# Patient Record
Sex: Male | Born: 1935 | Race: White | Hispanic: No | Marital: Married | Smoking: Former smoker
Health system: Southern US, Community
[De-identification: ages and names within clinical notes are randomized; demographics above are authoritative.]

## PROBLEM LIST (undated history)

## (undated) DIAGNOSIS — E785 Hyperlipidemia, unspecified: Secondary | ICD-10-CM

## (undated) DIAGNOSIS — J449 Chronic obstructive pulmonary disease, unspecified: Secondary | ICD-10-CM

## (undated) DIAGNOSIS — B009 Herpesviral infection, unspecified: Secondary | ICD-10-CM

## (undated) DIAGNOSIS — I4891 Unspecified atrial fibrillation: Secondary | ICD-10-CM

## (undated) DIAGNOSIS — J9383 Other pneumothorax: Principal | ICD-10-CM

## (undated) DIAGNOSIS — T7840XA Allergy, unspecified, initial encounter: Secondary | ICD-10-CM

## (undated) DIAGNOSIS — K219 Gastro-esophageal reflux disease without esophagitis: Secondary | ICD-10-CM

## (undated) DIAGNOSIS — N529 Male erectile dysfunction, unspecified: Secondary | ICD-10-CM

## (undated) DIAGNOSIS — N400 Enlarged prostate without lower urinary tract symptoms: Secondary | ICD-10-CM

## (undated) DIAGNOSIS — I2510 Atherosclerotic heart disease of native coronary artery without angina pectoris: Secondary | ICD-10-CM

## (undated) DIAGNOSIS — R718 Other abnormality of red blood cells: Secondary | ICD-10-CM

## (undated) DIAGNOSIS — M549 Dorsalgia, unspecified: Secondary | ICD-10-CM

## (undated) DIAGNOSIS — M1990 Unspecified osteoarthritis, unspecified site: Secondary | ICD-10-CM

## (undated) HISTORY — DX: Male erectile dysfunction, unspecified: N52.9

## (undated) HISTORY — DX: Unspecified osteoarthritis, unspecified site: M19.90

## (undated) HISTORY — DX: Unspecified atrial fibrillation: I48.91

## (undated) HISTORY — DX: Other abnormality of red blood cells: R71.8

## (undated) HISTORY — DX: Benign prostatic hyperplasia without lower urinary tract symptoms: N40.0

## (undated) HISTORY — DX: Dorsalgia, unspecified: M54.9

## (undated) HISTORY — PX: CATARACT EXTRACTION, BILATERAL: SHX1313

## (undated) HISTORY — DX: Other pneumothorax: J93.83

## (undated) HISTORY — DX: Herpesviral infection, unspecified: B00.9

## (undated) HISTORY — DX: Allergy, unspecified, initial encounter: T78.40XA

## (undated) HISTORY — DX: Gastro-esophageal reflux disease without esophagitis: K21.9

## (undated) HISTORY — DX: Hyperlipidemia, unspecified: E78.5

---

## 1985-10-17 DIAGNOSIS — E785 Hyperlipidemia, unspecified: Secondary | ICD-10-CM

## 1990-01-17 DIAGNOSIS — D539 Nutritional anemia, unspecified: Secondary | ICD-10-CM | POA: Insufficient documentation

## 1996-04-17 DIAGNOSIS — F329 Major depressive disorder, single episode, unspecified: Secondary | ICD-10-CM | POA: Insufficient documentation

## 1998-08-18 ENCOUNTER — Encounter: Payer: Self-pay | Admitting: Family Medicine

## 1998-08-18 LAB — CONVERTED CEMR LAB: PSA: 0.4 ng/mL

## 1998-10-18 DIAGNOSIS — K5730 Diverticulosis of large intestine without perforation or abscess without bleeding: Secondary | ICD-10-CM | POA: Insufficient documentation

## 1999-08-18 ENCOUNTER — Encounter: Payer: Self-pay | Admitting: Family Medicine

## 2000-10-17 ENCOUNTER — Encounter: Payer: Self-pay | Admitting: Family Medicine

## 2000-10-17 LAB — CONVERTED CEMR LAB: PSA: 0.4 ng/mL

## 2001-11-17 ENCOUNTER — Encounter: Payer: Self-pay | Admitting: Family Medicine

## 2001-11-17 DIAGNOSIS — J309 Allergic rhinitis, unspecified: Secondary | ICD-10-CM

## 2001-11-17 LAB — CONVERTED CEMR LAB: PSA: 0.4 ng/mL

## 2001-12-17 DIAGNOSIS — M949 Disorder of cartilage, unspecified: Secondary | ICD-10-CM

## 2001-12-17 DIAGNOSIS — M479 Spondylosis, unspecified: Secondary | ICD-10-CM | POA: Insufficient documentation

## 2001-12-17 DIAGNOSIS — M899 Disorder of bone, unspecified: Secondary | ICD-10-CM | POA: Insufficient documentation

## 2001-12-28 ENCOUNTER — Encounter: Admission: RE | Admit: 2001-12-28 | Discharge: 2001-12-28 | Payer: Self-pay | Admitting: Family Medicine

## 2001-12-28 ENCOUNTER — Encounter: Payer: Self-pay | Admitting: Family Medicine

## 2002-11-18 ENCOUNTER — Encounter: Payer: Self-pay | Admitting: Family Medicine

## 2002-11-18 DIAGNOSIS — E739 Lactose intolerance, unspecified: Secondary | ICD-10-CM

## 2002-11-18 LAB — CONVERTED CEMR LAB: PSA: 0.8 ng/mL

## 2003-04-15 ENCOUNTER — Encounter: Admission: RE | Admit: 2003-04-15 | Discharge: 2003-04-15 | Payer: Self-pay | Admitting: Family Medicine

## 2003-10-18 ENCOUNTER — Encounter: Payer: Self-pay | Admitting: Family Medicine

## 2003-10-18 LAB — CONVERTED CEMR LAB: PSA: 0.5 ng/mL

## 2003-11-17 ENCOUNTER — Ambulatory Visit: Payer: Self-pay | Admitting: Family Medicine

## 2003-11-19 ENCOUNTER — Ambulatory Visit: Payer: Self-pay | Admitting: Family Medicine

## 2004-02-18 ENCOUNTER — Ambulatory Visit: Payer: Self-pay | Admitting: Gastroenterology

## 2004-02-20 ENCOUNTER — Ambulatory Visit: Payer: Self-pay | Admitting: Family Medicine

## 2004-03-10 ENCOUNTER — Ambulatory Visit: Payer: Self-pay | Admitting: Gastroenterology

## 2004-05-18 ENCOUNTER — Ambulatory Visit: Payer: Self-pay | Admitting: Family Medicine

## 2004-08-12 ENCOUNTER — Ambulatory Visit: Payer: Self-pay | Admitting: Family Medicine

## 2004-11-12 ENCOUNTER — Ambulatory Visit: Payer: Self-pay | Admitting: Internal Medicine

## 2004-11-17 ENCOUNTER — Encounter: Payer: Self-pay | Admitting: Family Medicine

## 2004-11-17 LAB — CONVERTED CEMR LAB: PSA: 0.55 ng/mL

## 2004-11-18 ENCOUNTER — Ambulatory Visit: Payer: Self-pay | Admitting: Family Medicine

## 2004-11-23 ENCOUNTER — Ambulatory Visit: Payer: Self-pay | Admitting: Family Medicine

## 2004-12-13 ENCOUNTER — Ambulatory Visit: Payer: Self-pay | Admitting: Family Medicine

## 2004-12-22 ENCOUNTER — Ambulatory Visit: Payer: Self-pay | Admitting: Family Medicine

## 2005-01-04 ENCOUNTER — Ambulatory Visit: Payer: Self-pay | Admitting: Family Medicine

## 2005-01-12 ENCOUNTER — Ambulatory Visit: Payer: Self-pay | Admitting: Family Medicine

## 2005-01-19 ENCOUNTER — Ambulatory Visit: Payer: Self-pay | Admitting: Family Medicine

## 2005-01-24 ENCOUNTER — Ambulatory Visit: Payer: Self-pay | Admitting: Family Medicine

## 2005-02-03 ENCOUNTER — Ambulatory Visit: Payer: Self-pay | Admitting: Family Medicine

## 2005-02-22 ENCOUNTER — Ambulatory Visit: Payer: Self-pay | Admitting: Family Medicine

## 2005-02-24 ENCOUNTER — Ambulatory Visit: Payer: Self-pay | Admitting: Family Medicine

## 2005-07-23 ENCOUNTER — Ambulatory Visit: Payer: Self-pay | Admitting: Family Medicine

## 2005-07-27 ENCOUNTER — Ambulatory Visit: Payer: Self-pay | Admitting: Family Medicine

## 2005-09-21 ENCOUNTER — Ambulatory Visit: Payer: Self-pay | Admitting: Family Medicine

## 2005-11-10 ENCOUNTER — Ambulatory Visit: Payer: Self-pay | Admitting: Family Medicine

## 2005-11-10 LAB — CONVERTED CEMR LAB
Hgb A1c MFr Bld: 5.7 %
Microalbumin U total vol: 8.4 mg/L
PSA: 0.53 ng/mL
TSH: 1.19 microintl units/mL

## 2005-11-21 ENCOUNTER — Ambulatory Visit: Payer: Self-pay | Admitting: Family Medicine

## 2005-12-15 ENCOUNTER — Ambulatory Visit: Payer: Self-pay | Admitting: Family Medicine

## 2006-01-02 ENCOUNTER — Ambulatory Visit: Payer: Self-pay | Admitting: Family Medicine

## 2006-01-17 DIAGNOSIS — B009 Herpesviral infection, unspecified: Secondary | ICD-10-CM | POA: Insufficient documentation

## 2006-01-20 ENCOUNTER — Ambulatory Visit: Payer: Self-pay | Admitting: Family Medicine

## 2006-01-30 ENCOUNTER — Ambulatory Visit: Payer: Self-pay | Admitting: Family Medicine

## 2006-07-27 ENCOUNTER — Encounter: Payer: Self-pay | Admitting: Family Medicine

## 2006-07-27 DIAGNOSIS — T883XXA Malignant hyperthermia due to anesthesia, initial encounter: Secondary | ICD-10-CM

## 2006-07-27 DIAGNOSIS — F1021 Alcohol dependence, in remission: Secondary | ICD-10-CM | POA: Insufficient documentation

## 2006-07-27 DIAGNOSIS — J449 Chronic obstructive pulmonary disease, unspecified: Secondary | ICD-10-CM

## 2006-07-27 DIAGNOSIS — K649 Unspecified hemorrhoids: Secondary | ICD-10-CM | POA: Insufficient documentation

## 2006-07-27 DIAGNOSIS — J4489 Other specified chronic obstructive pulmonary disease: Secondary | ICD-10-CM | POA: Insufficient documentation

## 2006-07-28 ENCOUNTER — Ambulatory Visit: Payer: Self-pay | Admitting: Family Medicine

## 2006-07-28 DIAGNOSIS — K219 Gastro-esophageal reflux disease without esophagitis: Secondary | ICD-10-CM

## 2007-01-29 ENCOUNTER — Ambulatory Visit: Payer: Self-pay | Admitting: Family Medicine

## 2007-01-29 DIAGNOSIS — E749 Disorder of carbohydrate metabolism, unspecified: Secondary | ICD-10-CM | POA: Insufficient documentation

## 2007-01-29 LAB — CONVERTED CEMR LAB
ALT: 18 units/L (ref 0–53)
AST: 23 units/L (ref 0–37)
Albumin: 4 g/dL (ref 3.5–5.2)
Alkaline Phosphatase: 59 units/L (ref 39–117)
BUN: 17 mg/dL (ref 6–23)
Bilirubin, Direct: 0.1 mg/dL (ref 0.0–0.3)
Calcium: 9.3 mg/dL (ref 8.4–10.5)
Chloride: 106 meq/L (ref 96–112)
Cholesterol: 151 mg/dL (ref 0–200)
GFR calc Af Amer: 63 mL/min
GFR calc non Af Amer: 52 mL/min
Glucose, Bld: 117 mg/dL — ABNORMAL HIGH (ref 70–99)
LDL Cholesterol: 93 mg/dL (ref 0–99)
Total CHOL/HDL Ratio: 4.4

## 2007-02-01 ENCOUNTER — Ambulatory Visit: Payer: Self-pay | Admitting: Family Medicine

## 2007-03-07 ENCOUNTER — Telehealth (INDEPENDENT_AMBULATORY_CARE_PROVIDER_SITE_OTHER): Payer: Self-pay | Admitting: Internal Medicine

## 2008-02-04 ENCOUNTER — Ambulatory Visit: Payer: Self-pay | Admitting: Family Medicine

## 2008-02-04 LAB — CONVERTED CEMR LAB
ALT: 19 units/L (ref 0–53)
Albumin: 3.8 g/dL (ref 3.5–5.2)
Basophils Absolute: 0 10*3/uL (ref 0.0–0.1)
Basophils Relative: 0.6 % (ref 0.0–3.0)
CO2: 28 meq/L (ref 19–32)
Calcium: 9.2 mg/dL (ref 8.4–10.5)
Cholesterol: 222 mg/dL (ref 0–200)
Creatinine, Ser: 1.4 mg/dL (ref 0.4–1.5)
Creatinine,U: 123.9 mg/dL
Eosinophils Absolute: 0.2 10*3/uL (ref 0.0–0.7)
GFR calc non Af Amer: 52 mL/min
HDL: 30.9 mg/dL — ABNORMAL LOW (ref >39.0)
Hemoglobin: 15.3 g/dL (ref 13.0–17.0)
Iron: 75 ug/dL (ref 42–165)
MCHC: 34.5 g/dL (ref 30.0–36.0)
MCV: 105.4 fL — ABNORMAL HIGH (ref 78.0–100.0)
Microalb, Ur: 0.2 mg/dL (ref 0.0–1.9)
Neutro Abs: 4.8 10*3/uL (ref 1.4–7.7)
RBC: 4.21 M/uL — ABNORMAL LOW (ref 4.22–5.81)
Total Bilirubin: 1.2 mg/dL (ref 0.3–1.2)
Total CHOL/HDL Ratio: 7.2
Triglycerides: 182 mg/dL — ABNORMAL HIGH (ref 0–149)
VLDL: 36 mg/dL (ref 0–40)
WBC: 7.3 10*3/uL (ref 4.5–10.5)

## 2008-02-05 LAB — CONVERTED CEMR LAB: Vit D, 1,25-Dihydroxy: 27 — ABNORMAL LOW (ref 30–89)

## 2008-02-06 ENCOUNTER — Ambulatory Visit: Payer: Self-pay | Admitting: Family Medicine

## 2008-02-06 DIAGNOSIS — F17200 Nicotine dependence, unspecified, uncomplicated: Secondary | ICD-10-CM

## 2008-02-11 ENCOUNTER — Encounter (INDEPENDENT_AMBULATORY_CARE_PROVIDER_SITE_OTHER): Payer: Self-pay | Admitting: *Deleted

## 2008-03-12 ENCOUNTER — Ambulatory Visit: Payer: Self-pay | Admitting: Family Medicine

## 2008-03-12 LAB — CONVERTED CEMR LAB: ALT: 21 units/L (ref 0–53)

## 2008-04-15 ENCOUNTER — Telehealth: Payer: Self-pay | Admitting: Family Medicine

## 2008-04-30 ENCOUNTER — Ambulatory Visit: Payer: Self-pay | Admitting: Family Medicine

## 2008-04-30 LAB — CONVERTED CEMR LAB
Cholesterol: 128 mg/dL (ref 0–200)
LDL Cholesterol: 73 mg/dL (ref 0–99)
Triglycerides: 116 mg/dL (ref 0.0–149.0)

## 2008-05-05 ENCOUNTER — Ambulatory Visit: Payer: Self-pay | Admitting: Family Medicine

## 2008-07-11 ENCOUNTER — Telehealth: Payer: Self-pay | Admitting: Family Medicine

## 2008-07-31 ENCOUNTER — Ambulatory Visit: Payer: Self-pay | Admitting: Family Medicine

## 2009-01-27 ENCOUNTER — Encounter (INDEPENDENT_AMBULATORY_CARE_PROVIDER_SITE_OTHER): Payer: Self-pay | Admitting: *Deleted

## 2009-01-28 ENCOUNTER — Telehealth: Payer: Self-pay | Admitting: Family Medicine

## 2009-02-03 ENCOUNTER — Ambulatory Visit: Payer: Self-pay | Admitting: Family Medicine

## 2009-02-04 LAB — CONVERTED CEMR LAB
ALT: 22 units/L (ref 0–53)
AST: 35 units/L (ref 0–37)
BUN: 15 mg/dL (ref 6–23)
Basophils Absolute: 0 10*3/uL (ref 0.0–0.1)
Bilirubin, Direct: 0.1 mg/dL (ref 0.0–0.3)
Calcium: 9.7 mg/dL (ref 8.4–10.5)
Cholesterol: 152 mg/dL (ref 0–200)
Creatinine, Ser: 1.5 mg/dL (ref 0.4–1.5)
Creatinine,U: 75.5 mg/dL
Direct LDL: 93 mg/dL
Eosinophils Relative: 2 % (ref 0.0–5.0)
GFR calc non Af Amer: 48 mL/min (ref >60)
Glucose, Bld: 114 mg/dL — ABNORMAL HIGH (ref 70–99)
HDL: 36.5 mg/dL — ABNORMAL LOW (ref >39.00)
Hgb A1c MFr Bld: 5.7 % (ref 4.6–6.5)
Microalb, Ur: 0.3 mg/dL (ref 0.0–1.9)
Monocytes Relative: 10.4 % (ref 3.0–12.0)
Neutrophils Relative %: 70.8 % (ref 43.0–77.0)
PSA: 0.73 ng/mL (ref 0.10–4.00)
Platelets: 226 10*3/uL (ref 150.0–400.0)
Potassium: 4.8 meq/L (ref 3.5–5.1)
RDW: 13.5 % (ref 11.5–14.6)
Total Bilirubin: 1.5 mg/dL — ABNORMAL HIGH (ref 0.3–1.2)
Triglycerides: 201 mg/dL — ABNORMAL HIGH (ref 0.0–149.0)
VLDL: 40.2 mg/dL — ABNORMAL HIGH (ref 0.0–40.0)
Vit D, 25-Hydroxy: 27 ng/mL — ABNORMAL LOW (ref 30–89)
Vitamin B-12: 548 pg/mL (ref 211–911)
WBC: 7.4 10*3/uL (ref 4.5–10.5)

## 2009-02-10 ENCOUNTER — Ambulatory Visit: Payer: Self-pay | Admitting: Family Medicine

## 2009-05-04 ENCOUNTER — Telehealth: Payer: Self-pay | Admitting: Family Medicine

## 2009-06-08 ENCOUNTER — Telehealth: Payer: Self-pay | Admitting: Family Medicine

## 2009-07-14 ENCOUNTER — Telehealth: Payer: Self-pay | Admitting: Family Medicine

## 2009-08-24 ENCOUNTER — Encounter (INDEPENDENT_AMBULATORY_CARE_PROVIDER_SITE_OTHER): Payer: Self-pay | Admitting: *Deleted

## 2009-09-01 ENCOUNTER — Ambulatory Visit: Payer: Self-pay | Admitting: Family Medicine

## 2009-09-01 DIAGNOSIS — R252 Cramp and spasm: Secondary | ICD-10-CM | POA: Insufficient documentation

## 2009-09-10 ENCOUNTER — Telehealth: Payer: Self-pay | Admitting: Gastroenterology

## 2009-09-23 ENCOUNTER — Telehealth: Payer: Self-pay | Admitting: Family Medicine

## 2009-11-23 ENCOUNTER — Telehealth: Payer: Self-pay | Admitting: Family Medicine

## 2010-02-10 ENCOUNTER — Ambulatory Visit
Admission: RE | Admit: 2010-02-10 | Discharge: 2010-02-10 | Payer: Self-pay | Source: Home / Self Care | Attending: Family Medicine | Admitting: Family Medicine

## 2010-02-10 DIAGNOSIS — D492 Neoplasm of unspecified behavior of bone, soft tissue, and skin: Secondary | ICD-10-CM | POA: Insufficient documentation

## 2010-02-16 NOTE — Progress Notes (Signed)
Summary: Rx Cyclobenzaprine  Phone Note Refill Request Call back at [phone] Message from:  [facility] on January 28, 2009 12:27 PM  Refills Requested: Medication #1:  FLEXERIL 10 MG  TABS one tab by mouth three times a day as needed   Last Refilled: 07/11/2008 Received refill request, please advise   Method Requested: Electronic Initial call taken by: Sherrian Divers CMA Deborra Medina),  January 28, 2009 12:27 PM  Follow-up for Phone Call        px written on EMR for call in  Follow-up by: Allena Earing MD,  January 28, 2009 1:29 PM  Additional Follow-up for Phone Call Additional follow up Details #1::        Script sent electronically. Additional Follow-up by: Marty Heck CMA,  January 28, 2009 2:16 PM    Prescriptions: FLEXERIL 10 MG  TABS (CYCLOBENZAPRINE HCL) one tab by mouth three times a day as needed  #30 x 0   Entered by:   Marty Heck CMA   Authorized by:   Allena Earing MD   Signed by:   Marty Heck CMA on 01/28/2009   Method used:   Electronically to        [facility] #[id number]* (retail)       [address]       [place], [place]  [address]       Ph: [phone]       Fax: [phone]   RxID:   [id number] FLEXERIL 10 MG  TABS (CYCLOBENZAPRINE HCL) one tab by mouth three times a day as needed  #30 x 0   Entered and Authorized by:   Allena Earing MD   Signed by:   Allena Earing MD on 01/28/2009   Method used:   Telephoned to ...       [id number] ([facility]ail)       [address]       [place], [place]  [address]       Ph: [phone]       Fax: [phone]   RxID:   [id number]

## 2010-02-16 NOTE — Progress Notes (Signed)
Summary: Cyclobenzaprine 38m refill  Phone Note Refill Request Call back at [phone] from:  [facility] MPhilipp Deputyon Jun 08, 2009 5:22 PM  Refills Requested: Medication #1:  FLEXERIL 10 MG  TABS one tab by mouth three times a day as needed [facility] request refill for Cyclobenzaprine 171mNo refill date sent. Please advise.    Method Requested: Telephone to Pharmacy Initial call taken by: ReOzzie HoylePN,  May 23, 206438:3:77M    Prescriptions: FLEXERIL 10 MG  TABS (CYCLOBENZAPRINE HCL) one tab by mouth three times a day as needed  #30 x 0   Entered by:   RoRaenette RoverD   Authorized by:   MaAllena EaringD   Signed by:   RoRaenette RoverD on 06/09/2009   Method used:   [facility]        [id number]       [address]     [place]  [id number]     Ph: [phone]     Fax: [phone] RxID:   [id number]

## 2010-02-16 NOTE — Assessment & Plan Note (Signed)
Summary: cpx/bir   Vital Signs:  Patient profile:   77 year old male Weight:      179 pounds Temp:     97.5 degrees F oral Pulse rate:   60 / minute Pulse rhythm:   regular BP sitting:   120 / 80  (left arm) Cuff size:   regular  Vitals Entered By: Emelia Salisbury LPN (February 11, 1476 1:50 PM) CC: 30 Minute checkup, had a colonoscopy by Dr. Fuller Plan 02/06   History of Present Illness: Pt here for followup. He has no complaints and feels well. He has been having dental work lately and had an inplant last week. He has been losing teeth. Otherwise he has no acute complaints and feels well.  Preventive Screening-Counseling & Management  Alcohol-Tobacco     Alcohol drinks/day: 0     Alcohol type: none past etohic     Smoking Status: quit     Year Quit: 06/2000     Pack years: 50     Passive Smoke Exposure: no  Caffeine-Diet-Exercise     Caffeine use/day: 4     Does Patient Exercise: yes     Type of exercise: works some, plays golf,cuts wood  Problems Prior to Update: 1)  Foot Pain, Right  (ICD-729.5) 2)  Muscle Spasm, Right Trap  (ICD-728.85) 3)  Special Screening Malignant Neoplasm of Prostate  (ICD-V76.44) 4)  Unspec Disorder Carbohydrate Transport&metab  (ICD-271.9) 5)  G E R D  (ICD-530.81) 6)  Herpes Simplex Infection, Type I  (ICD-054.9) 7)  Diverticulosis, Colon  (ICD-562.10) 8)  Glucose Intolerance  (ICD-271.3) 9)  Hyperthermia, Malignant, Fh  (ICD-995.86) 10)  Arthritis, Cervical Spine  (ICD-721.90) 11)  Osteopenia  (ICD-733.90) 12)  Allergic Rhinitis  (ICD-477.9) 13)  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 One Tab By Mouth At Night 2)  Viagra 50 Mg  Tabs (Sildenafil Citrate) .... Take One Hour  Prior 3)  Valtrex 1 Gm  Tabs (Valacyclovir Hcl) .... 2 Gm. Two Times A Day As Needed 4)  Prilosec 20 Mg  Cpdr (Omeprazole) .Marland Kitchen.. 1 Tab By Mouth 45 Mins Before Eating. 5)  Cinnamon 500 Mg  Caps (Cinnamon) .... Take One By Mouth Once A Day 6)  Flexeril 10 Mg  Tabs (Cyclobenzaprine Hcl) .... One Tab By Mouth Three Times A Day As Needed 7)  Multivitamins  Tabs (Multiple Vitamin) .... 3 Times Per Week 8)  Fish Oil 1000 Mg Caps (Omega-3 Fatty Acids) .... Takes As Needed During The Week 9)  Crestor 10 Mg Tabs (Rosuvastatin Calcium) .... One Tab By Mouth At Night  Allergies: 1)  ! Benadryl 2)  ! * Blistex  Past History:  Past Medical History: Last updated: 07/27/2006 Depression (04/17/1996) COPD Hyperlipidemia (10/17/1985) Allergic rhinitis (11/17/2001) Osteopenia (12/17/2001) Diverticulosis, colon (10/18/1998)  Past Surgical History: Last updated: 02/06/2008 Hosp DETOX pre 1998 OutPt Detox 3x pre 1998 ETOH Relapse 12/26/1997 FLEX bro w/ colon CA 1995 FH colon CA 10/1998 carotid US, AAA screen,abi wnl 08/20/1998 dexa osteopeniafemoral neck 12/18/2001 colonoscopy  divertics, int hemms (Dr Fuller Plan) 03/10/2004   Family History: Last updated: 02/10/2009 Father 12-18-2072DYe test for foot opn allergic rctn w/ MI Mother 18-Dec-1982DM All sibs deceased ,one ETOH, one  Stroke, one prob natural causes  Social History: Last updated: 02/06/2008 Occupation:Retired    [employer] Married Lives with wife 2 Children   Risk Factors: Alcohol Use: 0 (02/10/2009) Caffeine Use: 4 (02/10/2009) Exercise: yes (02/10/2009)  Risk Factors: Smoking Status: quit (02/10/2009) Passive Smoke Exposure: no (02/10/2009)  Family History: Father 11-Dec-2072DYe test for foot opn allergic rctn w/ MI Mother 1982-12-11DM All sibs deceased ,one ETOH, one Stroke, one prob natural causes  Review of Systems General:  Denies chills, fatigue, fever, sweats, weakness, and weight loss. Eyes:  Denies blurring, discharge,  eye irritation, eye pain, and itching. ENT:  Denies decreased hearing, earache, and ringing in ears. CV:  Complains of shortness of breath with exertion; denies palpitations; out of shape. Resp:  Denies chest discomfort, cough, shortness of breath, and wheezing; occas allergies.Marland Kitchen GI:  Complains of indigestion; denies abdominal pain, bloody stools, change in bowel habits, constipation, dark tarry stools, diarrhea, loss of appetite, nausea, vomiting, vomiting blood, and yellowish skin color; 0ccas. GU:  Denies discharge, dysuria, hematuria, incontinence, nocturia, and urinary frequency. MS:  Denies joint pain, joint redness, low back pain, muscle aches, cramps, and stiffness. Derm:  Denies dryness, flushing, itching, and rash. Neuro:  Denies numbness, poor balance, tingling, and tremors.  Physical Exam  General:  Well-developed,well-nourished,in no acute distress; alert,appropriate and cooperative throughout examination Head:  Normocephalic and atraumatic without obvious abnormalities. No apparent alopecia or balding. Sinuses NT. Eyes:  no injection.  Wears glasses, Conjunctiva clear bilaterally.  Ears:  External ear exam shows no significant lesions or deformities.  Otoscopic examination reveals clear canals, tympanic membranes are intact bilaterally without bulging, retraction, inflammation or discharge. Hearing is grossly normal bilaterally. Nose:  External nasal examination shows no deformity or inflammation. Nasal mucosa are pink and moist without lesions or exudates. Mouth:  Oral mucosa and oropharynx without lesions or exudates.  Teeth in mod repair. Neck:  No deformities, masses, or tenderness noted. Chest Wall:  No deformities, masses, tenderness or gynecomastia noted. Breasts:  No masses or gynecomastia noted Lungs:  Normal respiratory effort, chest expands symmetrically. Lungs are clear to auscultation, no crackles or wheezes. Heart:  Normal rate and regular rhythm. S1 and S2 normal  without gallop, murmur, click, rub or other extra sounds. Abdomen:  Bowel sounds positive,abdomen soft and non-tender without masses, organomegaly or hernias noted. Rectal:  No external abnormalities noted. Normal sphincter tone. No rectal masses or tenderness. G neg. Decoopmr ext hemms. Genitalia:  Testes bilaterally descended without nodularity, tenderness or masses. No scrotal masses or lesions. No penis lesions or urethral discharge. Very early bulge in right upper canal. Prostate:  Prostate gland firm and smooth, no enlargement, nodularity, tenderness, mass, asymmetry or induration. 20gms. Msk:  No deformity or scoliosis noted of thoracic or lumbar spine.   Pulses:  R and L carotid,radial,femoral,dorsalis pedis and posterior tibial pulses are full and equal bilaterally Extremities:  No clubbing, cyanosis, edema, or deformity noted with normal full range of motion of all joints.   Neurologic:  No cranial nerve deficits noted. Station and gait are normal. Plantar reflexes are down-going bilaterally. DTRs are symmetrical throughout. Sensory, motor and coordinative functions appear intact. Skin:  Intact without suspicious lesions or rashes Cervical Nodes:  No lymphadenopathy noted Inguinal Nodes:  No significant adenopathy Psych:  Cognition and judgment appear intact. Alert and cooperative with normal attention span and concentration. No apparent delusions, illusions, hallucinations   Impression & Recommendations:  Problem # 1:  SPECIAL SCREENING MALIGNANT  NEOPLASM OF PROSTATE (ICD-V76.44) Assessment Unchanged Stable PSA and exam.  Problem # 2:  UNSPEC DISORDER CARBOHYDRATE TRANSPORT&METAB (ICD-271.9) Assessment: Unchanged Satable, discussed avoiding sweets and carbs.  Problem # 3:  HERPES SIMPLEX INFECTION, TYPE I (ICD-054.9) Assessment: Unchanged Stable.  Problem # 4:  DIVERTICULOSIS, COLON (ICD-562.10) Assessment: Unchanged Stable, come in for prolonged LLQ discomfort.  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 Hgb: 15.5 (02/03/2009)   Hct: 48.9 (02/03/2009)   Platelets: 226.0 (02/03/2009) RBC: 4.50 (02/03/2009)   RDW: 13.5 (02/03/2009)   WBC: 7.4 (02/03/2009) MCV: 108.7 (02/03/2009)   MCHC: 31.7 (02/03/2009) Iron: 201 (02/03/2009)   B12: 548 (02/03/2009)   TSH: 1.27 (02/03/2009)  Problem # 6:  HEMORRHOIDS (ICD-455.6) Assessment: Unchanged Stable.  Problem # 7:  HYPERLIPIDEMIA (LPF-790.4) Adequate except avoid sweets and carbs. His updated medication list for this problem includes:    Crestor 10 Mg Tabs (Rosuvastatin calcium) ..... One tab by mouth at night  Labs Reviewed: SGOT: 35 (02/03/2009)   SGPT: 22 (02/03/2009)   HDL:36.50 (02/03/2009), 31.60 (04/30/2008)  LDL:73 (04/30/2008), DEL (02/04/2008)  Chol:152 (02/03/2009), 128 (04/30/2008)  Trig:201.0 (02/03/2009), 116.0 (04/30/2008)  Problem # 8:  DEPRESSION (ICD-311) Assessment: Unchanged  Well controlled. His updated medication list for this problem includes:    Trazodone Hcl 100 Mg Tabs (Trazodone hcl) ..... One tab by mouth at night  Complete Medication List: 1)  Trazodone Hcl 100 Mg Tabs (Trazodone hcl) .... One tab by mouth at night 2)  Viagra 50 Mg Tabs (Sildenafil citrate) .... Take one hour prior 3)  Valtrex 1 Gm Tabs (Valacyclovir hcl) .... 2 gm. two times a day as needed 4)  Prilosec 20 Mg Cpdr (Omeprazole) .Marland Kitchen.. 1 tab by mouth 45 mins before eating. 5)  Flexeril 10 Mg Tabs (Cyclobenzaprine hcl) .... One tab by mouth three times a day as needed 6)  Multivitamins Tabs (Multiple vitamin) .... 3 times per week 7)  Fish Oil 1000 Mg Caps (Omega-3 fatty acids) .... Takes as needed during the week 8)  Crestor 10 Mg Tabs (Rosuvastatin calcium) .... One tab by mouth at night  Other Orders: TD Toxoids IM 7 YR + (24097) Admin 1st Vaccine [phone]) Admin 1st Vaccine [facility]) [phone])  Patient Instructions: 1)  RTC  about Aug, call in Jun/Jul for appt.  Current  Allergies (reviewed today): ! BENADRYL ! * BLISTEX   Pneumovax Immunization History:    Pneumovax # 1:  Historical (12/17/2008)  Tetanus/Td Vaccine    Vaccine Type: Td    Site: left deltoid    Mfr: Wright    Dose: 0.5 ml    Route: IM    Given by: Emelia Salisbury LPN    Exp. Date: 01/22/2011    Lot #: [id number]    VIS given: 12/05/06 version given February 10, 2009.

## 2010-02-16 NOTE — Assessment & Plan Note (Signed)
Summary: AUGUST FOLLOW UP DR Council Mechanic PATIENT/[facility]   Vital Signs:  Patient profile:   77 year old male Height:      70 inches Weight:      174.75 pounds BMI:     25.16 Temp:     97.5 degrees F oral Pulse rate:   84 / minute Pulse rhythm:   regular BP sitting:   122 / 64  (left arm) Cuff size:   regular  Vitals Entered By: Christena Deem CMA Deborra Medina) (September 01, 2009 10:50 AM) CC: August follow up per RNS   Allergies: 1)  ! Benadryl 2)  ! * Blistex   Complete Medication List: 1)  Trazodone Hcl 100 Mg Tabs (Trazodone hcl) .... One tab by mouth at night 2)  Viagra 50 Mg Tabs (Sildenafil citrate) .... Take one hour prior 3)  Valtrex 1 Gm Tabs (Valacyclovir hcl) .... 2 gm. two times a day as needed 4)  Prilosec 20 Mg Cpdr (Omeprazole) .Marland Kitchen.. 1 tab by mouth 45 mins before eating. 5)  Flexeril 10 Mg Tabs (Cyclobenzaprine hcl) .... One tab by mouth three times a day as needed 6)  Multivitamins Tabs (Multiple vitamin) .... 3 times per week 7)  Fish Oil 1000 Mg Caps (Omega-3 fatty acids) .... Takes as needed during the week 8)  Crestor 10 Mg Tabs (Rosuvastatin calcium) .... One tab by mouth at night 9)  I-vite Protect Tabs (Multiple vitamins-minerals) .... Take 1 tablet by mouth once a day 10)  Allergy Relief 10 Mg Tbdp (Loratadine) .... Take 1 tablet by mouth once a day as needed  Current Allergies (reviewed today): ! BENADRYL ! * BLISTEX  Appended Document: AUGUST FOLLOW UP DR Council Mechanic PATIENT/[facility]     Vital Signs:  Patient profile:   77 year old male Height:      70 inches Weight:      174.75 pounds BMI:     25.16 Temp:     97.5 degrees F oral Pulse rate:   84 / minute Pulse rhythm:   regular BP sitting:   122 / 64  (left arm) Cuff size:   regular  Vitals Entered By: Christena Deem CMA Deborra Medina) (September 01, 2009 11:00 AM) CC: August Follow Up per RNS   History of Present Illness: Elevated Cholesterol: Using medications without problems:yes Muscle aches: as below Other  complaints:   Occ muscle cramps.  Tend to happen at night.  Was on statin for years before started having symptoms.  No happening every day.  No exertional symptoms.  Good relief with flexeril.  See plan.   Hyperglycemia, no dx of DM2.  [facility].  Limited lifting due to shoulder pain.  Exercise has increased since last lab visit.  Down 5lbs intentionally.  Has worked on diet and cut out candy bars.    Allergies: 1)  ! Benadryl 2)  ! * Blistex  Past History:  Family History: Last updated: 09/01/2009 Father dec 72 Dye test for foot opn allergic rxn w/ MI Mother dec 82 DM later in life All sibs deceased ,one ETOH, one Stroke, one prob natural causes  Social History: Last updated: 09/01/2009 Occupation:Retired    [employer] Married since 1957, Lives with wife 2 Children  prev smoker, quit in 2003 stopped drinking before 2003 Enjoys golf, grandchildren  Past Medical History: Depression (04/17/1996) COPD Hyperlipidemia (10/17/1985) Allergic rhinitis (11/17/2001) Osteopenia (12/17/2001) Diverticulosis, colon (10/18/1998) Insomnia- controlled with trazodone  Family History: Reviewed history from  02/10/2009 and no changes required. Father dec 72 Dye test for foot opn allergic rxn w/ MI Mother dec 82 DM later in life All sibs deceased ,one ETOH, one Stroke, one prob natural causes  Social History: Reviewed history from 02/06/2008 and no changes required. Occupation:Retired    [employer] Married since 1957, Lives with wife 2 Children  prev smoker, quit in 2003 stopped drinking before 2003 Enjoys golf, grandchildren  Review of Systems       See HPI.  Otherwise negative.    Physical Exam  General:  GEN: nad, alert and oriented HEENT: mucous membranes moist NECK: supple w/o LA CV: rrr.   PULM: ctab, no inc wob ABD: soft, +bs EXT: no edema SKIN: no acute rash    Impression & Recommendations:  Problem # 1:   HYPERLIPIDEMIA (ICD-272.4) will hold statin temporarily to see if cramps improve.  If no change, wills start back.  No other change in med now.  His updated medication list for this problem includes:    Crestor 10 Mg Tabs (Rosuvastatin calcium) ..... One tab by mouth at night  Problem # 2:  MUSCLE CRAMPS (ICD-729.82) hold statin for 2-4 weeks and notify clinic if much improved.  Use flexeril as needed in meantime.  he agrees with plan.   Problem # 3:  GLUCOSE INTOLERANCE (ICD-271.3) d/w patient LT:JQZE and exercise.  He is losing weight.  i would not change anything now.  Continue current measures.  Would recheck glucose at CPE next year.  He agrees.  D/w patient re: pathogenesis of prediabetes and DM2.   Complete Medication List: 1)  Trazodone Hcl 100 Mg Tabs (Trazodone hcl) .... One tab by mouth at night 2)  Viagra 50 Mg Tabs (Sildenafil citrate) .... Take one hour prior 3)  Valtrex 1 Gm Tabs (Valacyclovir hcl) .... 2 gm. two times a day as needed 4)  Prilosec 20 Mg Cpdr (Omeprazole) .Marland Kitchen.. 1 tab by mouth 45 mins before eating. 5)  Flexeril 10 Mg Tabs (Cyclobenzaprine hcl) .... One tab by mouth three times a day as needed 6)  Multivitamins Tabs (Multiple vitamin) .... 3 times per week 7)  Fish Oil 1000 Mg Caps (Omega-3 fatty acids) .... Takes as needed during the week 8)  Crestor 10 Mg Tabs (Rosuvastatin calcium) .... One tab by mouth at night 9)  I-vite Protect Tabs (Multiple vitamins-minerals) .... Take 1 tablet by mouth once a day 10)  Allergy Relief 10 Mg Tbdp (Loratadine) .... Take 1 tablet by mouth once a day as needed  Patient Instructions: 1)  Don't take the crestor for about 2-4 weeks.  Let us know if the muscle cramps are much better.  If there isn't a change, it would be okay to start back on the medicine.   2)  Schedule a physical for 2/12.  Prescriptions: CRESTOR 10 MG TABS (ROSUVASTATIN CALCIUM) one tab by mouth at night  #30 Tablet x 11   Entered and Authorized by:    [name] MD   Signed by:   [name] MD on 09/01/2009   Method used:   Electronically to        [facility] [phone]* (retail)       [address]       [place], [place]  [address]       Ph: [phone]       Fax: [phone]   RxID:   [id number] [facility]  10 MG  TABS (CYCLOBENZAPRINE HCL) one tab by mouth three times a day as needed  #30 x 3   Entered and Authorized by:   [name] MD   Signed by:   [name] MD on 09/01/2009   Method used:   Electronically to        [facility] [phone]* (retail)       [address]       [place], [place]  [address]       Ph: [phone]       Fax: [phone]   [place]:   [phone]

## 2010-02-16 NOTE — Progress Notes (Signed)
Summary: Rx Trazodone  Phone Note Call from Patient Call back at Home Phone [phone]   Caller: Patient Call For: Dr. Damita Dunnings Summary of Call: Patient is calling because the [facility] has messed up his rx for Trazodone. Patient mailed in a form  to the [facility] for a 3 month supply of his Trazodone and they lost the form. He has mailed another form in and should receive it in about 2 weeks. Patient would like a rx called to a local pharmacy to last him until he receives this from the [facility].  [facility] [address] Initial call taken by: Emelia Salisbury LPN,  November 24, 7090 9:30 AM  Follow-up for Phone Call        sent in.  please notify patient.  Follow-up by: [name] MD,  November 23, 2009 2:00 PM  Additional Follow-up for Phone Call Additional follow up Details #1::        Patient Advised.  Additional Follow-up by: Christena Deem CMA ([place]),  November 23, 2009 3:20 PM    Prescriptions: TRAZODONE HCL 100 MG  TABS (TRAZODONE HCL) one tab by mouth at night  #30 x 0   Entered and Authorized by:   [name] MD   Signed by:   [name] MD on 11/23/2009   Method used:   [facility]        [facility] [phone][facility] (retail)       [address]       [place], [place]  [address]       Ph: [phone]       Fax: [phone]   RxID:   [id number]

## 2010-02-16 NOTE — Progress Notes (Signed)
Summary: wants phone call   Phone Note Call from Patient Call back at Home Phone [phone]   Caller: Patient Call For: Raenette Rover MD Summary of Call: Patient is requesting phone call from you regariding his Trazadone, [facility] and did not for some reason. He says that he has been trying to reach someone there regarding this and has been unbale to do so.  Initial call taken by: Lacretia Nicks,  May 04, 2009 4:20 PM  Follow-up for Phone Call        Patient requested that rx been called to [facility]. Emelia Salisbury LPN  May 04, 6224 3:33 PM     Prescriptions: TRAZODONE HCL 100 MG  TABS (TRAZODONE HCL) one tab by mouth at night  #30 x 5   Entered and Authorized by:   Raenette Rover MD   Signed by:   Raenette Rover MD on 05/04/2009   Method used:   [facility]        [facility] [phone]* (retail)       [address]       [place], [place]  [address]       Ph: [phone]       Fax: [phone]   RxID:   [id number]

## 2010-02-16 NOTE — Letter (Signed)
Summary: Anabel Halon letter  [facility] at [facility]  [address] [place], [place] [address]   Phone: [phone]  Fax: [phone]       08/24/2009 MRN: [id number]  [place] [name], [place]  [address]  Dear Mr. [name] [facility], and [place] announce the retirement of [name], M.D., from full-time practice at the [facility] office effective July 16, 2009 and his plans of returning part-time.  It is important to Dr. Council Long and to our practice that you understand that [facility] has seven physicians in our office for your health care needs.  We will continue to offer the same exceptional care that you have today.    Dr. Council Long has spoken to many of you about his plans for retirement and returning part-time in the fall.   We will continue to work with you through the transition to schedule appointments for you in the office and meet the high standards that [facility] is committed to.   Again, it is with great pleasure that we share the news that Dr. Council Long will return to [facility] in October of 2011 with a reduced schedule.    If you have any questions, or would like to request an appointment with one of our physicians, please call us at [phone] and press the option for Scheduling an appointment.  We take pleasure in providing you with excellent patient care and look forward to seeing you at your next office visit.  [facility] are:  [name], M.D. [name], M.D. [name], M.D. [name], M.D. [name], M.D. [name], M.D. We proudly welcomed [name], M.D. and [name], M.D. to the practice in July/August 2011.  Sincerely,  [facility]

## 2010-02-16 NOTE — Progress Notes (Signed)
Summary: Schedule Colonoscopy  Phone Note Outgoing Call Call back at [facility] Phone [phone]   Call placed by: Bernita Buffy CMA Deborra Medina),  September 10, 2009 3:45 PM Call placed to: Patient Summary of Call: Left a message on patients machine to call back. patient is due for a colonoscopy Initial call taken by: Bernita Buffy CMA Deborra Medina),  September 10, 2009 3:46 PM  Follow-up for Phone Call        patient wants to wait until Feb 2012 to get this done.. Follow-up by: ES, [facility]

## 2010-02-16 NOTE — Progress Notes (Signed)
Summary: Viagra  Phone Note Refill Request Call back at [phone] Message from:  [facility] on July 14, 2009 11:55 AM  Refills Requested: Medication #1:  VIAGRA 50 MG  TABS take one hour prior  Method Requested: Electronic Initial call taken by: Emelia Salisbury LPN,  July 15, 9442 61:90 AM    Prescriptions: VIAGRA 50 MG  TABS (SILDENAFIL CITRATE) take one hour prior  #5 x 12   Entered and Authorized by:   Raenette Rover MD   Signed by:   Raenette Rover MD on 07/14/2009   Method used:   [facility]        [id number]       [address]       [place], [place]  [address]       Ph: [phone]       Fax: [phone]   RxID:   [id number]

## 2010-02-16 NOTE — Progress Notes (Signed)
Summary: Update on Crestor  Phone Note Call from Patient Call back at Home Phone [phone]   Caller: Patient Call For: Dr. Damita Dunnings Summary of Call: Patient came by the office and left a note to let you know that he quit taking his Crestor for 3 week and had no cramps in his legs. Initial call taken by: Emelia Salisbury LPN,  September 24, 4678 12:38 PM  Follow-up for Phone Call        I think the cramps were coming from the crestor.  I would have the patient stop the crestor and start pravastatin 109m by mouth at bedtime.  This is much less likely to cause cramps.  Please send in rx for pravastatin 240mby mouth at bedtime, #90, 3rf.  thanks.  We can recheck lipids at [facility] in early 2012.  If the cramps come back on the pravastatin, have the patient notify us[place]ore[place] stop the med.   Follow-up by: GrElsie StainD,  September 23, 2009 9:48 PM  Additional Follow-up for Phone Call Additional follow up Details #1::        Patient notified as instructed by telephone. Was informed by patient that he thinks he may have had the leg cramps prior to starting the Crestor. Patient states that he has 2 months of Crestor left and would like to try and finish that before switching over to another medication.   Patient is aware that Dr. DuDamita Dunningss out today. Additional Follow-up by: ReEmelia SalisburyPN,  September  8, 203212:43 AM   New Allergies: ! CRESTOR Additional Follow-up for Phone Call Additional follow up Details #2::    have him restart the crestor and notify the clinic if the cramps start back.  thanks.  If the cramps come back, he'll need to change to pravastatin.  Follow-up by: GrElsie StainD,  September 24, 2009 6:04 PM  Additional Follow-up for Phone Call Additional follow up Details #3:: Details for Additional Follow-up Action Taken: Patient Advised.  LuChristena DeemMA (ADeborra Medina September 25, 2009 3:48 PM   New Allergies: ! CRESTOR

## 2010-02-16 NOTE — Letter (Signed)
Summary: Colonoscopy Letter  [facility]  [place], [place] [address]   Phone: [phone]  Fax: [phone]      January 27, 2009 MRN: [id number]   [address] [place], [place]  [address]   Dear Mr. Churchwell,   According to your medical record, it is time for you to schedule a Colonoscopy. The American Cancer Society recommends this procedure as a method to detect early colon cancer. Patients with a family history of colon cancer, or a personal history of colon polyps or inflammatory bowel disease are at increased risk.  This letter has beeen generated based on the recommendations made at the time of your procedure. If you feel that in your particular situation this may no longer apply, please contact our office.  Please call our office at [phone] to schedule this appointment or to update your records at your earliest convenience.  Thank you for cooperating with [place] to provide you with the very best care possible.   Sincerely,  Norberto Sorenson T. Fuller Plan, M.D.  [facility] [phone]

## 2010-02-18 NOTE — Assessment & Plan Note (Signed)
Summary: pain in finger/alc   Vital Signs:  Patient profile:   77 year old male Weight:      179.25 pounds Temp:     97.7 degrees F oral Pulse rate:   74 / minute Pulse rhythm:   regular BP sitting:   130 / 80  (left arm) Cuff size:   regular  Vitals Entered By: Maudie Mercury Dance CMA Deborra Medina) (February 10, 2010 12:28 PM) CC: Check left ring finger   History of Present Illness: CC: L ring finger  50 years ago bruised L ring finger while lifting heavy piece of metal, bruised against small raised piece of metal.  denies foreign body, never broke skin just bruised.  Now spot seems to be growing, for last 5 years, spreading proximally to where ring is.  Wedding ring on finger for 49-50 years, has been unable to take off.  No pain.  not feeling tight *yet*.  playing golf feels tingling when swinging, irritating.    no other growths on body.  no h/o gout.  no joint pains.   Current Medications (verified): 1)  Trazodone Hcl 100 Mg  Tabs (Trazodone Hcl) .... One Tab By Mouth At Night 2)  Viagra 50 Mg  Tabs (Sildenafil Citrate) .... Take One Hour Prior 3)  Valtrex 1 Gm  Tabs (Valacyclovir Hcl) .... 2 Gm. Two Times A Day As Needed 4)  Prilosec 20 Mg  Cpdr (Omeprazole) .Marland Kitchen.. 1 Tab By Mouth 45 Mins Before Eating. 5)  Flexeril 10 Mg  Tabs (Cyclobenzaprine Hcl) .... One Tab By Mouth Three Times A Day As Needed 6)  Multivitamins  Tabs (Multiple Vitamin) .... 3 Times Per Week 7)  Fish Oil 1000 Mg Caps (Omega-3 Fatty Acids) .... Takes As Needed During The Week 8)  I-Vite Protect  Tabs (Multiple Vitamins-Minerals) .... Take 1 Tablet By Mouth Once A Day 9)  Allergy Relief 10 Mg Tbdp (Loratadine) .... Take 1 Tablet By Mouth Once A Day As Needed  Allergies: 1)  ! * Blistex  Past History:  Past Medical History: Last updated: 09/01/2009 Depression (04/17/1996) COPD Hyperlipidemia (10/17/1985) Allergic rhinitis (11/17/2001) Osteopenia (12/17/2001) Diverticulosis, colon (10/18/1998) Insomnia-  controlled with trazodone  Social History: Last updated: 09/01/2009 Occupation:Retired    [employer] Married since 1957, Lives with wife 2 Children  prev smoker, quit in 2003 stopped drinking before 2003 Enjoys golf, grandchildren  Review of Systems       per HPI  Physical Exam  General:  Well-developed,well-nourished,in no acute distress; alert,appropriate and cooperative throughout examination Msk:  L ring finger between MCP and PIP growth palmar surface of hand, nontender, + extension proximally and medially, firm rubbery indurated. Pulses:  2+ rad pulses Extremities:  No clubbing, cyanosis, edema, or deformity noted with normal full range of motion of all joints.   Neurologic:  sensation intact Axillary Nodes:  No palpable lymphadenopathy   Impression & Recommendations:  Problem # 1:  NEOPLASMS UNSPEC NATURE BONE SOFT TISSUE&SKIN (ICD-239.2) xray - ring artifact but there doesn't seem to be bony involvement.  doubt gouty as no other manifestations of such.  referral to hand surgeon for eval esp as seems to be enlarging and spreading proximally.  pt willing to have ring removed.  Orders: T-Hand Right 3 views (73130TC) Orthopedic Surgeon Referral (Ortho Surgeon)  Complete Medication List: 1)  Trazodone Hcl 100 Mg Tabs (Trazodone hcl) .... One tab by mouth at night 2)  Viagra 50 Mg Tabs (Sildenafil citrate) .... Take one hour prior 3)  Valtrex 1 Gm Tabs (Valacyclovir hcl) .... 2 gm. two times a day as needed 4)  Prilosec 20 Mg Cpdr (Omeprazole) .Marland Kitchen.. 1 tab by mouth 45 mins before eating. 5)  Flexeril 10 Mg Tabs (Cyclobenzaprine hcl) .... One tab by mouth three times a day as needed 6)  Multivitamins Tabs (Multiple vitamin) .... 3 times per week 7)  Fish Oil 1000 Mg Caps (Omega-3 fatty acids) .... Takes as needed during the week 8)  I-vite Protect Tabs (Multiple vitamins-minerals) .... Take 1 tablet by mouth once a day 9)  Allergy Relief 10 Mg Tbdp (Loratadine) ....  Take 1 tablet by mouth once a day as needed  Patient Instructions: 1)  Pass by Marion's office for referral to hand surgeon. 2)  This could be granuloma formation (scarring) from remote bruise, but I don't like that it's spreading towards the ring. 3)  We will set you up with a hand surgeon to take a look. 4)  Good to meet you today.   Orders Added: 1)  T-Hand Right 3 views [73130TC] 2)  Est. Patient Level III [34037] 3)  Orthopedic Surgeon Referral [Ortho Surgeon]    Current Allergies (reviewed today): ! * BLISTEX

## 2010-02-23 ENCOUNTER — Encounter: Payer: Self-pay | Admitting: Family Medicine

## 2010-03-04 ENCOUNTER — Ambulatory Visit: Payer: Self-pay | Admitting: Cardiovascular Disease

## 2010-03-12 ENCOUNTER — Telehealth (INDEPENDENT_AMBULATORY_CARE_PROVIDER_SITE_OTHER): Payer: Self-pay | Admitting: *Deleted

## 2010-03-15 ENCOUNTER — Other Ambulatory Visit: Payer: Self-pay | Admitting: Family Medicine

## 2010-03-15 ENCOUNTER — Encounter (INDEPENDENT_AMBULATORY_CARE_PROVIDER_SITE_OTHER): Payer: Self-pay | Admitting: *Deleted

## 2010-03-15 ENCOUNTER — Other Ambulatory Visit (INDEPENDENT_AMBULATORY_CARE_PROVIDER_SITE_OTHER): Payer: Medicare Other

## 2010-03-15 DIAGNOSIS — E785 Hyperlipidemia, unspecified: Secondary | ICD-10-CM

## 2010-03-15 DIAGNOSIS — D539 Nutritional anemia, unspecified: Secondary | ICD-10-CM

## 2010-03-15 LAB — CBC WITH DIFFERENTIAL/PLATELET
Basophils Absolute: 0 10*3/uL (ref 0.0–0.1)
Eosinophils Absolute: 0.1 10*3/uL (ref 0.0–0.7)
HCT: 44 % (ref 39.0–52.0)
Hemoglobin: 15.3 g/dL (ref 13.0–17.0)
Lymphs Abs: 1.4 10*3/uL (ref 0.7–4.0)
MCHC: 34.8 g/dL (ref 30.0–36.0)
Monocytes Absolute: 0.8 10*3/uL (ref 0.1–1.0)
Neutro Abs: 6 10*3/uL (ref 1.4–7.7)
RDW: 15 % — ABNORMAL HIGH (ref 11.5–14.6)

## 2010-03-15 LAB — LDL CHOLESTEROL, DIRECT: Direct LDL: 146.2 mg/dL

## 2010-03-15 LAB — BASIC METABOLIC PANEL
CO2: 27 mEq/L (ref 19–32)
Glucose, Bld: 99 mg/dL (ref 70–99)
Potassium: 4.6 mEq/L (ref 3.5–5.1)
Sodium: 141 mEq/L (ref 135–145)

## 2010-03-15 LAB — TSH: TSH: 0.78 u[IU]/mL (ref 0.35–5.50)

## 2010-03-15 LAB — HEPATIC FUNCTION PANEL
AST: 22 U/L (ref 0–37)
Albumin: 3.9 g/dL (ref 3.5–5.2)

## 2010-03-15 LAB — LIPID PANEL
HDL: 30.5 mg/dL — ABNORMAL LOW (ref >39.00)
Triglycerides: 170 mg/dL — ABNORMAL HIGH (ref 0.0–149.0)

## 2010-03-15 LAB — B12 AND FOLATE PANEL: Folate: 17.2 ng/mL (ref >5.9)

## 2010-03-16 NOTE — Progress Notes (Signed)
----  Converted from flag ---- ---- 03/12/2010 1:17 PM, [name] MD wrote: b12/folate/iron/TSH/cbc     281.9 cmet/lipid      272.0  ---- 03/12/2010 9:52 AM, Daralene Milch CMA (AAMA) wrote: Lab orders please! Good Morning! This pt is scheduled for cpx labs Monday, which labs to draw and dx codes to use? Thanks Tasha ------------------------------

## 2010-03-16 NOTE — Letter (Signed)
Summary: [facility] [place] [facility] [place] By: Jamelle Haring 03/10/2010 09:07:31  _____________________________________________________________________  External Attachment:    Type:   Image     Comment:   External Document

## 2010-03-18 ENCOUNTER — Encounter (INDEPENDENT_AMBULATORY_CARE_PROVIDER_SITE_OTHER): Payer: Medicare Other | Admitting: Family Medicine

## 2010-03-18 ENCOUNTER — Encounter: Payer: Self-pay | Admitting: Family Medicine

## 2010-03-18 DIAGNOSIS — J449 Chronic obstructive pulmonary disease, unspecified: Secondary | ICD-10-CM

## 2010-03-18 DIAGNOSIS — M899 Disorder of bone, unspecified: Secondary | ICD-10-CM

## 2010-03-18 DIAGNOSIS — E785 Hyperlipidemia, unspecified: Secondary | ICD-10-CM

## 2010-03-18 DIAGNOSIS — M949 Disorder of cartilage, unspecified: Secondary | ICD-10-CM

## 2010-03-18 DIAGNOSIS — D539 Nutritional anemia, unspecified: Secondary | ICD-10-CM

## 2010-03-18 DIAGNOSIS — J4489 Other specified chronic obstructive pulmonary disease: Secondary | ICD-10-CM

## 2010-03-25 ENCOUNTER — Encounter (INDEPENDENT_AMBULATORY_CARE_PROVIDER_SITE_OTHER): Payer: Self-pay | Admitting: *Deleted

## 2010-03-25 ENCOUNTER — Other Ambulatory Visit: Payer: Medicare Other

## 2010-03-25 ENCOUNTER — Other Ambulatory Visit: Payer: Self-pay | Admitting: Family Medicine

## 2010-03-25 DIAGNOSIS — Z1289 Encounter for screening for malignant neoplasm of other sites: Secondary | ICD-10-CM

## 2010-03-25 NOTE — Assessment & Plan Note (Addendum)
Summary: MEDICARE PHYSICAL/TRANSFER FROM DR SCHALLER/CLE  MEDICARE COM.Marland KitchenMarland Kitchen   Vital Signs:  Patient profile:   77 year old male Height:      70 inches Weight:      175.50 pounds BMI:     25.27 Temp:     97.9 degrees F oral Pulse rate:   80 / minute Pulse rhythm:   regular BP sitting:   144 / 74  (left arm) Cuff size:   regular  Vitals Entered By: [place] Deborra Medina) (March 18, 2010 11:09 AM) CC: CPX - Transfer from [facility]   History of Present Illness: PSA options were discussed along with recent recs.  No indication for psa at this point, since patient is low risk and there is no FH of prosate CA.  He declined testing of PSA.  Neg FH, normal stream w/o dysuria.    Insomnia controlled with trazodone. Doing well with this.  No increase/change in symptoms.   MDD resolved after stopping alcohol.  Feeling well and mood is stable.  Bright outlook.  H/o osteopenia and due for rescanning.   COPD controlled w/o wheeze or current symptoms.  Not short of breath, no sputum.  No fevers.  Feeling well.   H/o increased MCV and labs d/w patient today.   Elevated Cholesterol: Using medications without problems:off meds Muscle aches: yes, on 28m of crestor a day Other complaints: no, see plan.   Allergies: 1)  ! * Blistex 2)  ! Crestor  Past History:  Past Medical History: Last updated: 09/01/2009 Depression (04/17/1996) COPD Hyperlipidemia (10/17/1985) Allergic rhinitis (11/17/2001) Osteopenia (12/17/2001) Diverticulosis, colon (10/18/1998) Insomnia- controlled with trazodone  Past Surgical History: Hosp DETOX pre 1998 OutPt Detox 3x pre 1998 ETOH Relapse 12/26/1997 FLEX bro w/ colon CA 1995 FH colon CA 10/1998 carotid [place] AAA screen,abi wnl 08/20/1998 dexa osteopenia femoral neck 12/18/2001 colonoscopy  divertics, int hemms (Dr SFuller Plan 03/10/2004   Family History: Reviewed history from 09/01/2009 and no changes required. Father dec 72 Dye test for foot opn allergic rxn w/  MI Mother dec 82 DM later in life All sibs deceased ,one ETOH, one Stroke, one prob natural causes No family h/o prostate cancer  Social History: Reviewed history from 09/01/2009 and no changes required. Occupation:Retired    [employer] Married since 1957, Lives with wife 2 Children  prev smoker, quit in 2003 stopped drinking before 2003 Enjoys golf, grandchildren  Review of Systems       See HPI.  Otherwise negative.    Physical Exam  General:  no apparent distress normocephalic atraumatic mucous membranes moist neck supple regular rate and rhythm  clear to auscultation bilaterally abdomen soft, not tender to palpation ext w/o edema Prostate:  Prostate gland firm and smooth, no enlargement, nodularity, tenderness, mass, asymmetry or induration.   Impression & Recommendations:  Problem # 1:  COPD ([id number] Stable by exam and w/o meds.  Stopped smoking.  follow clinically.   Problem # 2:  OSTEOPENIA (ICD-733.90) refer for recheck DXA.  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  no symptoms and not anemic, no change at this  point.    Problem # 4:  HYPERLIPIDEMIA (IFTD-3224) d/w patient.  He'll try the 531mdose and see if he has cramps.  6 weeks of samples given.   His updated medication list for this problem includes:    Crestor 5 Mg Tabs (Rosuvastatin calcium) ...Marland Kitchen. 1 by mouth at bedtime  Complete  Medication List: 1)  Trazodone Hcl 100 Mg Tabs (Trazodone hcl) .... Take two  tabs  by mouth at night 2)  Viagra 50 Mg Tabs (Sildenafil citrate) .... Take one hour prior 3)  Valtrex 1 Gm Tabs (Valacyclovir hcl) .... 2 gm. two times a day as needed 4)  Prilosec 20 Mg Cpdr (Omeprazole) .Marland Kitchen.. 1 tab by mouth 45 mins before eating. 5)  Flexeril 10 Mg Tabs (Cyclobenzaprine hcl) .... One tab by mouth three times a day as needed 6)  Multivitamins Tabs (Multiple vitamin) .... 3 times per week 7)  Fish Oil  1000 Mg Caps (Omega-3 fatty acids) .... Takes as needed during the week 8)  I-vite Protect Tabs (Multiple vitamins-minerals) .... Take 2  tablets  a day 9)  Aleve 220 Mg Tabs (Naproxen sodium) .... Take 1 tablet by mouth once a day 10)  Advil 200 Mg Tabs (Ibuprofen) .... As needed 11)  Crestor 5 Mg Tabs (Rosuvastatin calcium) .Marland Kitchen.. 1 by mouth at bedtime  Other Orders: Medicare -1st Annual Wellness Visit [phone])  Patient Instructions: 1)  Try the crestor at 47m a night.  If the cramps come back, let me know and stop the medicine.  We can consider other meds at that time.   2)  See MRosaria Ferriesabout your referral before your leave today.  3)  Keep exercising and playing golf. 4)  Take care.  Glad to see you.   5)  I would recheck your lipids in 6 months before an OV.  272.0.  Prescriptions: CRESTOR 5 MG TABS (ROSUVASTATIN CALCIUM) 1 by mouth at bedtime  #90 x 3   Entered and Authorized by:   GElsie StainMD   Signed by:   GElsie StainMD on 03/18/2010   Method used:   Print then Give to Patient   RxID:   [id number]   Orders Added: 1)  Medicare -1st Annual Wellness Visit [[X2119]2)  Est. Patient Level III [[41740]3)  Radiology Referral [Radiology] 4)  Est. Patient Level IV [[81448]  Immunization History:  Influenza Immunization History:    Influenza:  historical (10/17/2009)   Immunization History:  Influenza Immunization History:    Influenza:  Historical (10/17/2009)  Current Allergies (reviewed today): ! * BLISTEX ! CRESTOR   Appended Document: MEDICARE PHYSICAL/TRANSFER FROM DR SCHALLER/CLE  MEDICARE COM...    Clinical Lists Changes  Problems: Assessed OSTEOPENIA as comment only -  Orders: T-Bone Densitometry ((18563 T-Lumbar Vertebral Assessment ((14970  Orders: Added new Test order of T-Bone Densitometry ([phone] - Signed Added new Test order of T-Lumbar Vertebral Assessment ([phone] - Signed        Impression & Recommendations:  Problem # 1:   OSTEOPENIA (ICD-733.90)  Orders: T-Bone Densitometry ((27741 T-Lumbar Vertebral Assessment ((28786  Complete Medication List: 1)  Trazodone Hcl 100 Mg Tabs (Trazodone hcl) .... Take two  tabs  by mouth at night 2)  Viagra 50 Mg Tabs (Sildenafil citrate) .... Take one hour prior 3)  Valtrex 1 Gm Tabs (Valacyclovir hcl) .... 2 gm. two times a day as needed 4)  Prilosec 20 Mg Cpdr (Omeprazole) ..Marland Kitchen. 1 tab by mouth 45 mins before eating. 5)  Flexeril 10 Mg Tabs (Cyclobenzaprine hcl) .... One tab by mouth three times a day as needed 6)  Multivitamins Tabs (Multiple vitamin) .... 3 times per week 7)  Fish Oil 1000 Mg Caps (Omega-3 fatty acids) .... Takes as needed during the week 8)  I-vite Protect Tabs (Multiple vitamins-minerals) .... Take 2  tablets  a day 9)  Aleve 220 Mg Tabs (Naproxen sodium) .... Take 1 tablet by mouth once a day 10)  Advil 200 Mg Tabs (Ibuprofen) .... As needed 11)  Crestor 5 Mg Tabs (Rosuvastatin calcium) .Marland Kitchen.. 1 by mouth at bedtime

## 2010-03-25 NOTE — Letter (Signed)
Summary: [facility]: Immunoassay Fecal Occult Blood (iFOB) Order Form  [facility]  [address] [place], [place] [address]   Phone: [phone]  Fax: [phone]      [facility]: Immunoassay Fecal Occult Blood (iFOB) Order Form   March 18, 2010 MRN: [id number]   Artis Reppucci 01/06/1929   Physicican Name:______duncan___________________  Diagnosis Code:_______v76.49___________________      [name] MD

## 2010-03-30 ENCOUNTER — Ambulatory Visit (INDEPENDENT_AMBULATORY_CARE_PROVIDER_SITE_OTHER)
Admission: RE | Admit: 2010-03-30 | Discharge: 2010-03-30 | Disposition: A | Discharge status: Home / Self Care | Payer: Medicare Other | Source: Ambulatory Visit

## 2010-03-30 ENCOUNTER — Other Ambulatory Visit: Payer: Self-pay | Admitting: Family Medicine

## 2010-03-30 DIAGNOSIS — M949 Disorder of cartilage, unspecified: Secondary | ICD-10-CM

## 2010-03-30 DIAGNOSIS — M899 Disorder of bone, unspecified: Secondary | ICD-10-CM

## 2010-03-30 LAB — HM DEXA SCAN

## 2010-04-13 ENCOUNTER — Encounter: Payer: Self-pay | Admitting: Family Medicine

## 2010-05-12 ENCOUNTER — Telehealth: Payer: Self-pay | Admitting: *Deleted

## 2010-05-12 ENCOUNTER — Encounter: Payer: Self-pay | Admitting: Family Medicine

## 2010-05-12 NOTE — Telephone Encounter (Signed)
Documentation of Bone density.

## 2010-05-13 ENCOUNTER — Encounter: Payer: Self-pay | Admitting: Family Medicine

## 2010-05-14 ENCOUNTER — Encounter: Payer: Self-pay | Admitting: Family Medicine

## 2010-09-04 ENCOUNTER — Emergency Department (HOSPITAL_COMMUNITY): Payer: Medicare Other

## 2010-09-04 ENCOUNTER — Inpatient Hospital Stay (HOSPITAL_COMMUNITY)
Admission: EM | Admit: 2010-09-04 | Discharge: 2010-09-07 | DRG: 200 | Disposition: A | Discharge status: Home / Self Care | Payer: Medicare Other | Attending: Thoracic Surgery (Cardiothoracic Vascular Surgery) | Admitting: Thoracic Surgery (Cardiothoracic Vascular Surgery)

## 2010-09-04 DIAGNOSIS — Z87891 Personal history of nicotine dependence: Secondary | ICD-10-CM

## 2010-09-04 DIAGNOSIS — J9383 Other pneumothorax: Secondary | ICD-10-CM

## 2010-09-04 DIAGNOSIS — J930 Spontaneous tension pneumothorax: Secondary | ICD-10-CM

## 2010-09-04 DIAGNOSIS — Y849 Medical procedure, unspecified as the cause of abnormal reaction of the patient, or of later complication, without mention of misadventure at the time of the procedure: Secondary | ICD-10-CM | POA: Diagnosis not present

## 2010-09-04 DIAGNOSIS — I4891 Unspecified atrial fibrillation: Secondary | ICD-10-CM | POA: Diagnosis not present

## 2010-09-04 DIAGNOSIS — I998 Other disorder of circulatory system: Secondary | ICD-10-CM | POA: Diagnosis not present

## 2010-09-04 DIAGNOSIS — I808 Phlebitis and thrombophlebitis of other sites: Secondary | ICD-10-CM | POA: Diagnosis not present

## 2010-09-04 DIAGNOSIS — Z79899 Other long term (current) drug therapy: Secondary | ICD-10-CM

## 2010-09-04 DIAGNOSIS — Z8249 Family history of ischemic heart disease and other diseases of the circulatory system: Secondary | ICD-10-CM

## 2010-09-04 DIAGNOSIS — E785 Hyperlipidemia, unspecified: Secondary | ICD-10-CM | POA: Diagnosis present

## 2010-09-04 DIAGNOSIS — Y9210 Unspecified residential institution as the place of occurrence of the external cause: Secondary | ICD-10-CM | POA: Diagnosis not present

## 2010-09-04 HISTORY — DX: Other pneumothorax: J93.83

## 2010-09-04 HISTORY — PX: OTHER SURGICAL HISTORY: SHX169

## 2010-09-04 LAB — CBC
MCV: 100.5 fL — ABNORMAL HIGH (ref 78.0–100.0)
Platelets: 253 10*3/uL (ref 150–400)
RBC: 4.4 MIL/uL (ref 4.22–5.81)
RDW: 13.7 % (ref 11.5–15.5)
WBC: 11.6 10*3/uL — ABNORMAL HIGH (ref 4.0–10.5)

## 2010-09-04 LAB — POCT I-STAT, CHEM 8
Chloride: 108 mEq/L (ref 96–112)
Glucose, Bld: 118 mg/dL — ABNORMAL HIGH (ref 70–99)
HCT: 48 % (ref 39.0–52.0)
Hemoglobin: 16.3 g/dL (ref 13.0–17.0)
Potassium: 4.1 mEq/L (ref 3.5–5.1)
Sodium: 142 mEq/L (ref 135–145)

## 2010-09-04 LAB — POCT I-STAT TROPONIN I: Troponin i, poc: 0.07 ng/mL (ref 0.00–0.08)

## 2010-09-05 ENCOUNTER — Inpatient Hospital Stay (HOSPITAL_COMMUNITY): Payer: Medicare Other

## 2010-09-06 ENCOUNTER — Inpatient Hospital Stay (HOSPITAL_COMMUNITY): Payer: Medicare Other

## 2010-09-06 ENCOUNTER — Telehealth: Payer: Self-pay | Admitting: *Deleted

## 2010-09-06 DIAGNOSIS — J939 Pneumothorax, unspecified: Secondary | ICD-10-CM | POA: Insufficient documentation

## 2010-09-06 DIAGNOSIS — R072 Precordial pain: Secondary | ICD-10-CM

## 2010-09-06 DIAGNOSIS — I4891 Unspecified atrial fibrillation: Secondary | ICD-10-CM

## 2010-09-06 NOTE — Telephone Encounter (Signed)
Admitted with L PTX.

## 2010-09-06 NOTE — Op Note (Signed)
  NAMEGEVORG, BRUM NO.:  [id number]  MEDICAL RECORD NO.:  [id number]  LOCATION:                                 FACILITY:  PHYSICIAN:  Revonda Standard. Roxan Hockey, M.D.DATE OF BIRTH:  03-Jul-1928  DATE OF PROCEDURE: DATE OF DISCHARGE:                              OPERATIVE REPORT   PREOPERATIVE DIAGNOSIS:  Left spontaneous pneumothorax.  POSTOPERATIVE DIAGNOSIS:  Left spontaneous pneumothorax.  PROCEDURE:  28-French chest tube placement, left chest.  SURGEON:  Revonda Standard. Roxan Hockey, MD  ANESTHESIA:  Local with intravenous sedation.  FINDINGS:  Positive rush of air.  Initial air leak resolved when tube placed on suction.  CLINICAL NOTE:  [name] is an 77 year old gentleman who presented with a left spontaneous pneumothorax.  Chest tube is indicated to reexpand the lungs as the patient had persistent symptoms of chest discomfort and shortness of breath.  The indications, risks, benefits, and alternatives were discussed in detail with the patient.  He accepted the risks and agreed to proceed.  OPERATIVE NOTE:  After obtaining informed consent, the patient was given 2 mg of Versed and 2 mg of morphine intravenously.  Using sterile technique and 1% lidocaine local anesthetic (25 mL), a 20-French chest tube was placed in the left chest.  The lung was prepped and draped in the sterile fashion.  Lidocaine 1% was infiltrated into the skin and subcutaneous tissue.  After allowing initial effect, additional lidocaine was instilled along the tract for chest tube placement including the rib and intercostal space.  After confirming adequate local anesthesia, an incision was made.  Hemostat was used to bluntly dissect.  The patient did experience discomfort as the intercostal muscles were penetrated.  There was positive rush of air.  A 28-French chest tube was placed through the incision into the chest.  There was fogging of the tube and air leak with cough and  the tube was secured with two 3-0 silk sutures and dressed and taped. The tube was placed to a Pleur-Evac drainage.  Initially with coughing, there was an air leak, but after the initial air was evacuated there was no ongoing leak with the tube on suction.  We will leave the tube on suction overnight.     Revonda Standard Roxan Hockey, M.D.    SCH/MEDQ  D:  09/04/2010  T:  09/04/2010  Job:  [id number]  Electronically Signed by Modesto Charon M.D. on 09/06/2010 01:43:39 PM

## 2010-09-06 NOTE — Telephone Encounter (Signed)
[place] Triage Call Report Triage Record Num: [id number] Operator: Abe People Patient Name: [name] Call Date & Time: 09/04/2010 8:20:42AM Patient Phone: [phone] PCP: Patient Gender: Male PCP Fax : Patient DOB: 1928-01-28 Practice Name: [facility] Reason for Call: Pt calling that 09/03/10 PM he was started sneezing and then had a pain in his throat and chest. Then he had pain in the L side fo his chest and had to sleep sitting up and it is still there. If he takes a deep breath it hurts. Has SOB with exertion and lying down. Triaged Chest pain and is worse with lying down. Inst needs to go to E/R now. Protocol(s) Used: Chest Pain Recommended Outcome per Protocol: See ED Immediately Reason for Outcome: Symptoms worse when lying down AND better when sitting and leaning forward Care Advice: ~ Another adult should drive. ~ Do not eat or drink anything until evaluated by provider. Call EMS 911 if chest pain or difficulty breathing worsens or new swelling of lower extremities or gradual abdomen swelling. ~ Write down provider's name. List or place the following in a bag for transport with the patient: current prescription and/or nonprescription medications; alternative treatments, therapies and medications; and street drugs. ~ 09/04/2010 8:34:15AM Page 1 of 1 [email or web address]

## 2010-09-06 NOTE — H&P (Signed)
[name], [name] NO.:  [id number]  MEDICAL RECORD NO.:  [id number]  LOCATION:  [facility]                         FACILITY:  [name]  PHYSICIAN:  Revonda Standard. Roxan Hockey, M.D.DATE OF BIRTH:  11-05-1928  DATE OF ADMISSION:  09/04/2010 DATE OF DISCHARGE:                             HISTORY & PHYSICAL   [name] is an 77 year old gentleman who presented with a chief complaint of left-sided chest pain, shortness of breath.  HISTORY OF PRESENT ILLNESS:  [name] was in his usual state of health.  Last evening, he was reading the newspaper when he had a violent sneeze.  He said he noted left-sided chest pain and initially some shortness of breath, this waxed and waned.  He had difficulty breathing when he lied down last night and the pain was persistent. This morning, he still had shortness of breath and chest pain and came to the emergency room where he was found to have a left pneumothorax. This is moderately enlarged, but extended laterally all the way to the diaphragm.  PAST MEDICAL HISTORY:  Significant for hyperlipidemia.  He denies COPD, cardiac disease, DVT, PE, diabetes, or hypertension.  CURRENT MEDICATIONS:  Trazodone 50 mg p.o. at bedtime.  ALLERGIES:  He has no known drug allergies, but does have an intolerance to CRESTOR, which causes muscle cramps.  FAMILY HISTORY:  Noncontributory.  SOCIAL HISTORY:  Nonsmoker.  Lives with his wife.  He is retired.  REVIEW OF SYSTEMS:  The patient has been in good health until the acute event.  All other systems are negative.  PHYSICAL EXAMINATION:  GENERAL:  [name] is a well-appearing 85- year-old gentleman in no acute distress.  In general, he is well developed and well nourished VITAL SIGNS:  Blood pressure is 153/90, pulse 81, respirations are 15, oxygen saturation 88% on room air while on arrival, 100% on non- rebreather mask. NEUROLOGICAL:  Alert and oriented x3, intact. HEENT:   Unremarkable. NECK:  Supple without thyromegaly or bruits.  His trachea is midline. There is no subcutaneous emphysema. LUNGS:  Have diminished breath sounds throughout the left chest exam. CARDIAC:  Regular rate and rhythm.  Normal S1 and S2.  No rubs, murmurs, or gallops. ABDOMEN:  Soft, nontender. EXTREMITIES:  Without clubbing, cyanosis, or edema.  LABORATORY DATA:  Chest x-ray shows a left pneumothorax, possibly partially loculated, but does extend all the way to the diaphragm laterally.  His sodium is 142, potassium 4.1, BUN 18, creatinine 1.2, glucose 118, hematocrit 48.  White count 11.6, platelets 253.  IMPRESSION:  [name] is an 77 year old gentleman who presents with left spontaneous pneumothorax.  He has no known chronic obstructive pulmonary disease, but chest x-ray does show some changes consistent with chronic obstructive pulmonary disease.  He has persistent symptoms, rather large pneumothorax, and chest tube placement is indicated to re- expand the lung and relieve symptoms.  I discussed with the patient's wife the indications, risks, benefits, and alternatives.  They understand the risk of chest tube placement to include bleeding and infection.  He agrees to proceed after chest tube placement.  He will be admitted.     Revonda Standard Roxan Hockey, M.D.     SCH/MEDQ  D:  09/04/2010  T:  09/04/2010  Job:  [id number]  cc:   Elveria Rising. Damita Dunnings, M.D.  Electronically Signed by Modesto Charon M.D. on 09/06/2010 01:42:41 PM

## 2010-09-07 ENCOUNTER — Encounter (INDEPENDENT_AMBULATORY_CARE_PROVIDER_SITE_OTHER): Payer: Medicare Other

## 2010-09-07 ENCOUNTER — Inpatient Hospital Stay (HOSPITAL_COMMUNITY): Payer: Medicare Other

## 2010-09-07 DIAGNOSIS — I4891 Unspecified atrial fibrillation: Secondary | ICD-10-CM

## 2010-09-07 LAB — TSH: TSH: 2.046 u[IU]/mL (ref 0.350–4.500)

## 2010-09-07 LAB — LIPID PANEL
Cholesterol: 191 mg/dL (ref 0–200)
HDL: 41 mg/dL (ref >39)

## 2010-09-09 ENCOUNTER — Other Ambulatory Visit: Payer: Self-pay | Admitting: Family Medicine

## 2010-09-09 ENCOUNTER — Encounter: Payer: Self-pay | Admitting: Family Medicine

## 2010-09-09 DIAGNOSIS — E785 Hyperlipidemia, unspecified: Secondary | ICD-10-CM

## 2010-09-09 DIAGNOSIS — I480 Paroxysmal atrial fibrillation: Secondary | ICD-10-CM | POA: Insufficient documentation

## 2010-09-13 DIAGNOSIS — J9383 Other pneumothorax: Secondary | ICD-10-CM

## 2010-09-13 NOTE — Discharge Summary (Signed)
[name], Long NO.:  [id number]  MEDICAL RECORD NO.:  [id number]  LOCATION:  2025                         FACILITY:  [facility]  PHYSICIAN:  Revonda Standard. Roxan Hockey, M.D.DATE OF BIRTH:  November 22, 1928  DATE OF ADMISSION:  09/04/2010 DATE OF DISCHARGE:                              DISCHARGE SUMMARY   HISTORY:  The patient is an 77 year old gentleman who presented with a chief complaint of left-sided chest pain and shortness of breath.  The patient was in his usual state of health when on the evening prior to admission while reading his newspaper he had what he described as a violent sneeze.  He said he noted left-sided chest pain and initially some shortness of breath which waxed and waned.  He had difficulty breathing when he lied down on the evening prior to presentation and the pain remained persistent.  On the morning of presentation, he still had shortness of breath and chest pain and presented to the emergency department where he was found to have a left-sided pneumothorax.  This is moderately enlarged, but extended laterally all the way to the diaphragm.  He was felt to require chest tube placement and Thoracic Surgical consultation was obtained with Dr. Roxan Hockey.  He was admitted for further observation and treatment.  PAST MEDICAL HISTORY: 1. Hyperlipidemia. 2. He denies COPD, cardiac disease, DVT, pulmonary embolus, diabetes     or hypertension.  MEDICATIONS PRIOR TO ADMISSION: 1. Trazodone 50 mg at bedtime. 2. Ocuvite vitamin once daily. 3. Omeprazole 20 mg daily p.r.n.  ALLERGIES:  No known drug allergies but he is intolerant to CRESTOR which causes muscle cramps.  FAMILY HISTORY:  Noncontributory.  SOCIAL HISTORY:  He is a nonsmoker.  He lives with his wife.  He is retired.  REVIEW OF SYSTEMS:  Please see the history and physical done at the time of admission.  PHYSICAL EXAMINATION:  Please see the history and physical done at the time  of admission.  HOSPITAL COURSE:  The patient was admitted.  A left-sided chest tube was placed by Dr. Roxan Hockey.  He was treated with supplemental oxygen.  He additionally did have the development of atrial fibrillation.  He was given a short course of amiodarone which has subsequently been discontinued.  Cardiology consultation has been obtained with Dr. Aundra Dubin.  He will have an event monitor placed in their office.  He was started on aspirin.  He is felt to need a COPD evaluation as well.  This should be done with PFTs as an outpatient.  His rhythm is currently normal sinus rhythm.  He does have some phlebitis in the left forearm IV site and we started him on Keflex.  He is otherwise felt to be quite stable for discharge on today's date.  INSTRUCTIONS:  The patient received written instructions in regard to medications, activity, diet, wound care and followup.  FOLLOWUP:  Dr. Aundra Dubin on October 04, 2010, at 10:45.  Additionally, he will have an event monitor placed today in their office.  He will see Dr. Leonarda Salon nurse on September 14, 2010, for suture removal.  He will see Dr. Leonarda Salon PA on September 21, 2010, at 1 p.m. with a chest x- ray for  office visit followup.  MEDICATIONS ON DISCHARGE: 1. Aspirin 325 mg tablet daily. 2. Keflex 500 mg daily for 7 days. 3. Metoprolol 25 mg twice daily. 4. Oxycodone 5 mg one to two every 8 hours as needed for pain. 5. He is to continue his Ocuvite, omeprazole, and trazodone as     previously.  CONDITION ON DISCHARGE:  Stable, improved.  FINAL DIAGNOSIS:  Spontaneous left-sided pneumothorax status post tube thoracostomy, with resolution.  Other diagnoses include new-onset paroxysmal atrial fibrillation.  He is not to continue amiodarone at the time of this discharge dictation. Follow up to be as outlined.  Other diagnosis hyperlipidemia.     [name], P.A.-C.   ______________________________ Revonda Standard Roxan Hockey,  M.D.    Loren Racer  D:  09/07/2010  T:  09/07/2010  Job:  [id number]  cc:   [name], MD Elveria Rising. Damita Dunnings, M.D.  Electronically Signed by Jadene Pierini P.A.-C. on 09/09/2010 01:52:01 PM Electronically Signed by Modesto Charon M.D. on 09/13/2010 10:17:28 AM

## 2010-09-14 ENCOUNTER — Encounter (INDEPENDENT_AMBULATORY_CARE_PROVIDER_SITE_OTHER): Payer: Medicare Other

## 2010-09-14 DIAGNOSIS — J9311 Primary spontaneous pneumothorax: Secondary | ICD-10-CM

## 2010-09-15 ENCOUNTER — Other Ambulatory Visit: Payer: Self-pay | Admitting: Thoracic Surgery (Cardiothoracic Vascular Surgery)

## 2010-09-15 DIAGNOSIS — J930 Spontaneous tension pneumothorax: Secondary | ICD-10-CM

## 2010-09-16 ENCOUNTER — Other Ambulatory Visit (INDEPENDENT_AMBULATORY_CARE_PROVIDER_SITE_OTHER): Payer: Medicare Other

## 2010-09-16 DIAGNOSIS — E785 Hyperlipidemia, unspecified: Secondary | ICD-10-CM

## 2010-09-16 LAB — LIPID PANEL
LDL Cholesterol: 126 mg/dL — ABNORMAL HIGH (ref 0–99)
Total CHOL/HDL Ratio: 6
Triglycerides: 147 mg/dL (ref 0.0–149.0)

## 2010-09-17 DIAGNOSIS — J939 Pneumothorax, unspecified: Secondary | ICD-10-CM

## 2010-09-17 DIAGNOSIS — E749 Disorder of carbohydrate metabolism, unspecified: Secondary | ICD-10-CM

## 2010-09-21 ENCOUNTER — Ambulatory Visit (INDEPENDENT_AMBULATORY_CARE_PROVIDER_SITE_OTHER): Payer: Medicare Other | Admitting: Physician Assistant

## 2010-09-21 ENCOUNTER — Ambulatory Visit: Payer: Medicare Other | Admitting: Family Medicine

## 2010-09-21 ENCOUNTER — Ambulatory Visit
Admission: RE | Admit: 2010-09-21 | Discharge: 2010-09-21 | Disposition: A | Discharge status: Home / Self Care | Payer: Medicare Other | Source: Ambulatory Visit | Attending: Thoracic Surgery (Cardiothoracic Vascular Surgery) | Admitting: Thoracic Surgery (Cardiothoracic Vascular Surgery)

## 2010-09-21 VITALS — BP 112/67 | HR 52 | Resp 14 | Ht 71.0 in | Wt 172.0 lb

## 2010-09-21 DIAGNOSIS — J930 Spontaneous tension pneumothorax: Secondary | ICD-10-CM

## 2010-09-21 DIAGNOSIS — Z9889 Other specified postprocedural states: Secondary | ICD-10-CM

## 2010-09-21 DIAGNOSIS — J9383 Other pneumothorax: Secondary | ICD-10-CM

## 2010-09-21 NOTE — Progress Notes (Signed)
HPI  [name] is status post placement of a 76 French left chest tube done by Dr. Roxan Hockey 09/04/2010. [name]'s left pneumothorax resolved and he was ultimately discharged to home in stable condition. During the [name]'s hospital course he did develop atrial fibrillation, cardiology was consulted and he was seen by Dr. Aundra Dubin.  Plan was to place a Holter monitor on in the office for further management.  [name] presents today for 2 week followup visit of left pneumothorax.  He is doing well. He is up ambulating without difficulty and denies any shortness of breath or left chest pain.  [name]'s date present to Dr. Marcial Pacas office and a Holter monitor was placed.  He has a follow up appointment with him September 17 for review of his halter monitor.   Current Outpatient Prescriptions  Medication Sig Dispense Refill  . aspirin 325 MG tablet Take 325 mg by mouth daily.        . beta carotene w/minerals (OCUVITE) tablet Take 1 tablet by mouth daily.        . metoprolol tartrate (LOPRESSOR) 25 MG tablet Take 25 mg by mouth 2 (two) times daily.        Marland Kitchen omeprazole (PRILOSEC) 20 MG capsule Take 20 mg by mouth daily.        . traZODone (DESYREL) 50 MG tablet Take 50 mg by mouth at bedtime.        Marland Kitchen oxyCODONE (OXY IR/ROXICODONE) 5 MG immediate release tablet Take 5 mg by mouth every 8 (eight) hours as needed.            Physical Exam  Constitutional: He is oriented to person, place, and time. He appears well-developed and well-nourished.  Cardiovascular: Regular rhythm, normal heart sounds and intact distal pulses.        Bradycardic  Pulmonary/Chest: Effort normal and breath sounds normal.  Neurological: He is alert and oriented to person, place, and time.  Skin: Skin is warm and dry.       Incision is clean dry and intact and healing well    BP 112/67  Pulse 52  Resp 14  Ht _0  (1.803 m)  Wt 172 lb (78.019 kg)  BMI 23.99 kg/m2  SpO2 96%  Diagnostic tests:  [name] had AP  and lateral chest x-ray taken today which shows no pneumothorax noted. He does have some small bilateral pleural effusions noted with mild atelectasis appeared  Impression/Plan:  [name] is noted to be stable following a left chest tube placement first spontaneous left pneumothorax. At this time he is told to continue ambulating 3 to 4 times per day.  It is okay for him to start driving at this time.  He is to keep his appointment Dr. Aundra Dubin in September. I will bring him back in one month with repeat PA and Lateral chest x-ray to see Dr. Roxan Hockey. In the interim if [name] develops any chest pain or shortness of breath he is to contact us and we will see him sooner.

## 2010-09-21 NOTE — Patient Instructions (Signed)
followup with Dr. Roxan Hockey in one month with repeat PA and lateral chest x-ray.  Keep appointment with Dr. Aundra Dubin in September. Contact the office if develops any increasing shortness of breath or left-sided chest pain

## 2010-09-28 ENCOUNTER — Encounter: Payer: Self-pay | Admitting: Family Medicine

## 2010-09-28 ENCOUNTER — Ambulatory Visit (INDEPENDENT_AMBULATORY_CARE_PROVIDER_SITE_OTHER): Payer: Medicare Other | Admitting: Family Medicine

## 2010-09-28 DIAGNOSIS — E785 Hyperlipidemia, unspecified: Secondary | ICD-10-CM

## 2010-09-28 DIAGNOSIS — I4891 Unspecified atrial fibrillation: Secondary | ICD-10-CM

## 2010-09-28 DIAGNOSIS — I480 Paroxysmal atrial fibrillation: Secondary | ICD-10-CM

## 2010-09-28 DIAGNOSIS — N529 Male erectile dysfunction, unspecified: Secondary | ICD-10-CM

## 2010-09-28 MED ORDER — TRAZODONE HCL 50 MG PO TABS: 150.0000 mg | ORAL_TABLET | Freq: Every day | ORAL | Status: DC

## 2010-09-28 MED ORDER — TRAZODONE HCL 50 MG PO TABS: 50.0000 mg | ORAL_TABLET | Freq: Every day | ORAL | Status: DC

## 2010-09-28 NOTE — Patient Instructions (Signed)
Please ask the cardiology clinic about your cholesterol.  I think it's okay for now, but if they want you to be on meds to lower it then let me know.  We can start you on pravastatin.  It's less likely to cause the aches.  Ask cardiology about your heart monitor and using cialis.  I didn't write the prescription today because of your heart rate and blood pressure.  I would get a flu shot each fall.   You can take 2 trazodone at night if needed for insomnia.  Physical in 6 months, labs ahead of time.  Take care.  Glad to see you.

## 2010-09-28 NOTE — Assessment & Plan Note (Signed)
I want cards input.  If they want him lower, I'll be glad to start pt on simvastatin.  Didn't tolerate crestor.

## 2010-09-28 NOTE — Progress Notes (Signed)
PTX with a sneeze.  Now s/p chest tube with f/u at chest clinic done recently.  Had AF transiently and cards eval done.  Has cards f/u pending; finished heart monitor eval yesterday.    Breathing well w/o chest pain (minimal, occ tightness with a deep breath).  No heart racing noted by patient after leaving the hospital. No pre/syncope.  No orthostatic sx.    Elevated Cholesterol: Using medications without problems:off meds, had aches on crestor.   Muscle aches: yes, on crestor.  He didn't start the pravastatin.  He worked on his diet get chol <200.   Labs reviewed with pt.    ED- didn't get a response on viagra and would like to try cialis.  I asked him to check with cards on this.    Insomnia.  Continues for pt, inspite of 125m trazodone at night.   PMH and SH reviewed  Meds, vitals, and allergies reviewed.   ROS: See HPI.  Otherwise negative.    GEN: nad, alert and oriented HEENT: mucous membranes moist NECK: supple w/o LA CV: brady, ~50 during exam, no ectopy PULM: ctab, no inc wob, chest tube site healed ABD: soft, +bs EXT: no edema SKIN: no acute rash

## 2010-09-28 NOTE — Assessment & Plan Note (Signed)
I want pt to ask cards about treatment.  If they agree, I'll rx cialis.  I didn't want to start this until he had the results from the ambulatory monitor. He agreed.

## 2010-09-28 NOTE — Assessment & Plan Note (Signed)
S/p ambulatory cards monitor and awaiting eval.  Regular today, I didn't change BB since his f/u with cards is pending and he denied orthostasis. D/w pt and he understood.

## 2010-10-01 ENCOUNTER — Encounter: Payer: Self-pay | Admitting: Cardiology

## 2010-10-02 NOTE — Consult Note (Signed)
NAMECHARLES, [name] NO.:  [id number]  MEDICAL RECORD NO.:  [id number]  LOCATION:  2025                         FACILITY:  [facility]  PHYSICIAN:  [name], MD      DATE OF BIRTH:  1928/03/06  DATE OF CONSULTATION:  09/06/2010 DATE OF DISCHARGE:                                CONSULTATION   PRIMARY CARE PHYSICIAN:  Elveria Rising. Damita Dunnings, MD, at [facility].  PRIMARY CARDIOLOGIST:  None.  CHIEF COMPLAINT:  PAF.  HISTORY OF PRESENT ILLNESS:  Mr. [name] is an 77 year old male with no cardiac history.  He was admitted on September 04, 2010, with a pneumothorax that happened after he sneezed.  This required a chest tube.  It improved and the chest tube was discontinued today.  Last p.m., he had sudden onset of AFib/RVR.  The amiodarone protocol was initiated and he spontaneously converted to sinus rhythm.  The duration was about 5 hours.  He had no clear symptoms with the arrhythmia, but may have been tired.  Among other things, he denies chest pain, shortness of breath, palpitations, or presyncope.  Currently, he is resting comfortably.  PAST MEDICAL HISTORY: 1. Hyperlipidemia. 2. Remote history of tobacco use. 3. Family history of coronary artery disease.  SURGICAL HISTORY:  None.  He was intolerant to CRESTOR with muscle cramp.  CURRENT MEDICATIONS: 1. Amiodarone IV with transition to 400 mg p.o. b.i.d. today. 2. Beta-carotene with minerals daily. 3. DVT Lovenox.  SOCIAL HISTORY:  He lives in [place] with his wife.  He is retired from [employer].  He quit tobacco approximately 12 years ago with a greater than 50-pack-year history.  He has no history of alcohol or drug abuse.  FAMILY HISTORY:  His mother died in her 67s and had a pacemaker, but no coronary artery disease known.  His father died at 25 with a CVA and also his family believes he had coronary artery disease.  No siblings have known coronary artery disease.  REVIEW OF SYSTEMS:   Prior to admission, he was active.  He states that he is not as fit as he used to be, but does not feel that his respiratory status has abruptly declined in any way.  After he sneezed, he gradually became more short of breath and began experiencing chest pain, but up until then had no history of chest pain and no shortness of breath at rest.  He has had no recent illnesses, fevers, or chills.  He has had no injuries.  He has had no GI symptoms or joint problems.  Full 14-point review of systems is otherwise negative except as stated in the HPI.  PHYSICAL EXAMINATION:  VITAL SIGNS:  Temperature is 98.7, blood pressure 111/75, heart rate 65, respiratory rate 18, and O2 saturation 93% on 1 liter. GENERAL:  He is a well-developed, elderly white male in no acute distress. HEENT:  Normal. NECK:  There is no lymphadenopathy, thyromegaly, bruit, or JVD noted. CARDIOVASCULAR:  His heart is regular in rate and rhythm with an S1 and S2 and no significant murmur, rub, or gallop is noted.  Distal pulses are intact in all 4 extremities. LUNGS:  He has some dry rales with slightly  decreased breath sounds in the bases. SKIN:  No rashes or lesions are noted. ABDOMEN:  Soft and nontender with active bowel sounds. EXTREMITIES:  There is no cyanosis, clubbing, or edema noted. MUSCULOSKELETAL:  There is no joint deformity or effusions and no spine or CVA tenderness. NEURO:  He is alert and oriented with cranial nerves II-XII grossly intact.  Chest x-ray done this a.m. shows left chest tube in place with no visible pneumothorax, stable left pleural effusion and atelectasis at the left lung base with severe COPD and emphysema.  Initial chest x-ray showed moderate-sized left-sided pneumothorax without mediastinal shift.  EKG done on admission shows sinus rhythm with a right bundle-branch block.  Later EKG shows atrial fibrillation, RVR, rate 113 with a right bundle-branch block still present.  Telemetry  strips show conversion to sinus rhythm of about midnight last night.  LABORATORY VALUES:  Hemoglobin 15.9, hematocrit 44.2, WBCs 11.6, platelets 253, and MCV 100.5.  Sodium 142, potassium 4.1, chloride 108, BUN 18, creatinine 1.2, and glucose 118.  Troponin-I 0.07.  IMPRESSION:  Mr. [name] was seen today by Dr. Aundra Dubin, the patient evaluated and the data reviewed.  He is an 77 year old male with minimal past medical history besides remote tobacco use who developed a pneumothorax after sneezing.  He was noted yesterday to have an episode of atrial fibrillation/rapid ventricular response. 1. Atrial fibrillation:  He has no prior history of AFib, though he     was essentially asymptomatic with this episode.  He was in AFib     about 5 hours.  We suspect it was triggered by the pneumothorax and     treatment of a pneumothorax with lung irritation.  His CHADS-VASc     score is 2.  If his AFib recurs, he should be anticoagulated.     a.     Continue amiodarone in hospital, was sent home on metoprolol      25 mg b.i.d.     b.     Anticoagulate with aspirin 325 mg daily     c.     Check echocardiogram while in hospital.     d.     Place a 3-week event monitor at discharge, if there are any      recurrent episodes of AFib, anticoagulate. 2. Pneumothorax:  We suspect a ruptured bleb with sneezing.  He should     get a COPD workup with PFTs as an outpatient.     Rosaria Ferries, PA-C   ______________________________ [name], MD    RB/MEDQ  D:  09/06/2010  T:  09/06/2010  Job:  [id number]  Electronically Signed by Rosaria Ferries PA-C on 09/15/2010 06:46:04 AM Electronically Signed by [name] MD on 10/02/2010 11:24:00 PM

## 2010-10-04 ENCOUNTER — Encounter: Payer: Self-pay | Admitting: Cardiology

## 2010-10-04 ENCOUNTER — Ambulatory Visit (INDEPENDENT_AMBULATORY_CARE_PROVIDER_SITE_OTHER): Payer: Medicare Other | Admitting: Cardiology

## 2010-10-04 DIAGNOSIS — E785 Hyperlipidemia, unspecified: Secondary | ICD-10-CM

## 2010-10-04 DIAGNOSIS — J9383 Other pneumothorax: Secondary | ICD-10-CM

## 2010-10-04 DIAGNOSIS — I480 Paroxysmal atrial fibrillation: Secondary | ICD-10-CM

## 2010-10-04 DIAGNOSIS — I4891 Unspecified atrial fibrillation: Secondary | ICD-10-CM

## 2010-10-04 DIAGNOSIS — J939 Pneumothorax, unspecified: Secondary | ICD-10-CM

## 2010-10-04 DIAGNOSIS — N529 Male erectile dysfunction, unspecified: Secondary | ICD-10-CM

## 2010-10-04 MED ORDER — METOPROLOL TARTRATE 12.5 MG HALF TABLET: 12.5000 mg | ORAL_TABLET | Freq: Two times a day (BID) | ORAL | Status: DC

## 2010-10-04 MED ORDER — TADALAFIL 5 MG PO TABS: 5.0000 mg | ORAL_TABLET | Freq: Every day | ORAL | Status: DC | PRN

## 2010-10-04 MED ORDER — METOPROLOL TARTRATE 25 MG PO TABS: 12.5000 mg | ORAL_TABLET | Freq: Two times a day (BID) | ORAL | Status: DC

## 2010-10-04 NOTE — Patient Instructions (Signed)
Your physician has recommended you make the following change in your medication:   Decrease Metoprolol  Your physician recommends that you schedule a follow-up appointment in:  1 year with Dr. Aundra Dubin

## 2010-10-05 NOTE — Assessment & Plan Note (Signed)
Possibly related to underlying COPD.  Would consider PFTs to work this up.

## 2010-10-05 NOTE — Assessment & Plan Note (Signed)
No definite indication for statin treatment.  LDL was 127 in the hospital.  In the absence of known vascular disease, I would encourage dietary changes to help lower this.

## 2010-10-05 NOTE — Progress Notes (Signed)
PCP: Dr. Damita Dunnings  77 yo with recent admission for pneumothorax and paroxysmal atrial fibrillation presents to establish outpatient cardiology care.  Patient developed a pneuthorax after a violent sneeze in 8/12.  He required a chest tube.  It was suspected that he ruptured a bleb.  While in the hospital, he developed atrial fibrillation with rapid response that converted after amiodarone was begun.  He had had no prior history of atrial fibrillation.  After discharge, 3 week event monitor showed no further atrial fibrillation.  Echo showed normal LV systolic function and no significant valvular dysfunction.    Patient seems to be doing well symptomatically.  He has never had exertional chest pain.  He has mild dyspnea walking up a hill but in general does quite well.  He still plays golf.  His heart rate is noted to be low (44 today) on metoprolol.  He denies lightheadedness or syncope.   Labs (8/12): LDL 126, HDL 33, TSH normal, creatinine 1.2  PMH: 1. Hyperlipidemia: myalgias with Crestor.  2. Atrial fibrillation: 1 episode has been noted in setting of pneumothorax in 8/12.  Converted back to NSR after starting amiodarone.  3 week event monitor afterwards showed no further atrial fibrillation.  Echo (8/12): EF 60-73%, grade 1 diastolic dysfunction, no significant valvular dysfunction.  3. Erectile dysfunction.  4. Pneumothorax after sneezing in 8/12 5. ? COPD  SH: Lives in [place] with wife.  Quit smoking around 2000 after 50 pack-years.  Retired from [employer].  No ETOH.   FH: Mother with PCM, father with CVA and ? CAD.  ROS: All systems reviewed and negative except as per HPI.   Current Outpatient Prescriptions  Medication Sig Dispense Refill  . aspirin 81 MG tablet Take 81 mg by mouth daily.        . beta carotene w/minerals (OCUVITE) tablet Take 1 tablet by mouth daily.        . metoprolol tartrate (LOPRESSOR) 25 MG tablet Take 0.5 tablets (12.5 mg total) by mouth 2 (two) times daily.   30 tablet  11  . omeprazole (PRILOSEC) 20 MG capsule Take 20 mg by mouth daily.        . traZODone (DESYREL) 50 MG tablet Take 3 tablets (150 mg total) by mouth at bedtime. For insomnia      . tadalafil (CIALIS) 5 MG tablet Take 1 tablet (5 mg total) by mouth daily as needed for erectile dysfunction.  15 tablet  6    BP 128/70  Pulse 44  Resp 14  Ht _0  (1.803 m)  Wt 172 lb (78.019 kg)  BMI 23.99 kg/m2 General: NAD Neck: No JVD, no thyromegaly or thyroid nodule.  Lungs: Clear to auscultation bilaterally with normal respiratory effort. CV: Nondisplaced PMI.  Heart regular S1/S2, no S3/S4, no murmur.  No peripheral edema.  No carotid bruit.  Normal pedal pulses.  Abdomen: Soft, nontender, no hepatosplenomegaly, no distention.  Skin: Intact without lesions or rashes.  Neurologic: Alert and oriented x 3.  Psych: Normal affect. Extremities: No clubbing or cyanosis.  HEENT: Normal.

## 2010-10-05 NOTE — Assessment & Plan Note (Signed)
No further atrial fibrillation noted on 3 week monitor.  Suspect that the atrial fibrillation was triggered by the pneumothorax.  It may be that it does not recur in the absence of a significant stressor.  He did not have any major abnormalities on echo.  CHADSVASC score is 2 (for age).  For now, I will treat him with aspirin, unless atrial fibrillation is noted to recur.  In that case, we would need to consider anticoagulation.  I will decrease metoprolol to 12.5 mg bid given bradycardia.  I will see him back in 1 year unless further issues develop.  If he continues to have HR < 50 after decreasing metoprolol, would stop it altogether.

## 2010-10-05 NOTE — Assessment & Plan Note (Signed)
No contraindication to Cialis.  I gave him a prescription today.

## 2010-10-08 ENCOUNTER — Telehealth: Payer: Self-pay | Admitting: Family Medicine

## 2010-10-08 NOTE — Telephone Encounter (Signed)
Pt was seen at cards, with the rec to consider PFTs.  Please let me know the status on this, ie our ability to perform this in the office, so we can make plans. Thanks.

## 2010-10-11 NOTE — Telephone Encounter (Signed)
PFT's should be able to be done here in the office as we discussed.

## 2010-10-12 NOTE — Telephone Encounter (Signed)
Please call and arrange visit (RN visit) for PFTs.  I'll look at the results and then notify pt.  Please explain to him that this is to work up a potential contributing factor to the pneumothorax. Thanks.  Let me know if you need an order.  Given his history of AF, I wouldn't do pre and post with albuterol.  I would just get a baseline PFT to review.

## 2010-10-14 ENCOUNTER — Other Ambulatory Visit: Payer: Self-pay | Admitting: Thoracic Surgery (Cardiothoracic Vascular Surgery)

## 2010-10-14 DIAGNOSIS — J9311 Primary spontaneous pneumothorax: Secondary | ICD-10-CM

## 2010-10-14 NOTE — Telephone Encounter (Signed)
Patient notified as instructed by telephone. Advised patient that Caren Griffins will be calling him back to get this scheduled.

## 2010-10-14 NOTE — Telephone Encounter (Signed)
Left message on machine for patient to call back.

## 2010-10-18 ENCOUNTER — Telehealth: Payer: Self-pay | Admitting: *Deleted

## 2010-10-18 NOTE — Telephone Encounter (Signed)
I spoke w/ Dr. Damita Dunnings, he would like for the pt to have the Spirometry test in this office. Per Dr. Damita Dunnings, he wants to rule out some concerns w/ pts Lungs.  Called pt left message on Friday, Sept 28th and today, Oct 1,2012..[name]

## 2010-10-19 ENCOUNTER — Encounter: Payer: Self-pay | Admitting: Thoracic Surgery (Cardiothoracic Vascular Surgery)

## 2010-10-19 ENCOUNTER — Ambulatory Visit (INDEPENDENT_AMBULATORY_CARE_PROVIDER_SITE_OTHER): Payer: Medicare Other | Admitting: Thoracic Surgery (Cardiothoracic Vascular Surgery)

## 2010-10-19 ENCOUNTER — Ambulatory Visit
Admission: RE | Admit: 2010-10-19 | Discharge: 2010-10-19 | Disposition: A | Discharge status: Home / Self Care | Payer: Medicare Other | Source: Ambulatory Visit | Attending: Thoracic Surgery (Cardiothoracic Vascular Surgery) | Admitting: Thoracic Surgery (Cardiothoracic Vascular Surgery)

## 2010-10-19 VITALS — BP 142/87 | HR 74 | Resp 20 | Ht 71.0 in | Wt 175.0 lb

## 2010-10-19 DIAGNOSIS — Z4682 Encounter for fitting and adjustment of non-vascular catheter: Secondary | ICD-10-CM

## 2010-10-19 DIAGNOSIS — J9383 Other pneumothorax: Secondary | ICD-10-CM

## 2010-10-19 DIAGNOSIS — J9311 Primary spontaneous pneumothorax: Secondary | ICD-10-CM

## 2010-10-19 NOTE — Progress Notes (Signed)
[name] is an 77 year old gentleman with a past history of tobacco abuse who presented on August 18 with a left spontaneous pneumothorax. A chest tube was placed. His air leak was small initially and resolved rapidly. After the chest tube was removed his lung stayed expanded and he was discharged without complication. He now returns for followup visit. He states that he has not had any new trouble with his breathing. It is about like it was before the pneumothorax occurred. He has no pain at the chest tube insertion site. He has been avoiding strenuous activity. He does state that at some point during his hospital stay, when being moved around in the bed, he developed some right shoulder discomfort. He's had some limitation of motion, but both the discomfort and limitation of motion have been improving. He obtained a resistance band and has been doing some exercises with his right arm and shoulder on his own. He has had similar problems with his left shoulder in the past.  His current medications are aspirin 81 mg daily, Ocuvite 1 tablet daily, Lopressor 12.5 mg by mouth twice a day, trazodone 50 mg by mouth each bedtime, Cialis 5 mg by mouth when necessary, Prilosec 20 mg daily, and ibuprofen when necessary.  Own blood pressure is 142/87 pulse 74 respirations 20  Lungs are clear with equal breath sounds bilaterally, and no rails or wheezing,  Chest tube site well-healed.  Chest x-ray shows no residual pneumothorax, there are changes of COPD  Impression  77 year-old gentleman with a remote history of tobacco abuse and COPD, status post chest tube placement for left spontaneous pneumothorax. At this point there are no restrictions on his activities. He was cautioned that he is at risk for recurrent pneumothorax. If that were to occur he would likely need surgery at that time  Plan  He is scheduled for pulmonary function testing by Dr. Damita Dunnings in the next few days. He'll continue to followup  with Dr. Damita Dunnings. I am happy to see him back at any time that I can be of any further assistance with his care

## 2010-10-20 ENCOUNTER — Ambulatory Visit: Payer: Medicare Other | Admitting: Family Medicine

## 2010-10-20 DIAGNOSIS — J449 Chronic obstructive pulmonary disease, unspecified: Secondary | ICD-10-CM

## 2010-11-04 ENCOUNTER — Other Ambulatory Visit: Payer: Self-pay | Admitting: Family Medicine

## 2010-12-13 ENCOUNTER — Emergency Department (HOSPITAL_COMMUNITY): Payer: Medicare Other

## 2010-12-13 ENCOUNTER — Inpatient Hospital Stay (HOSPITAL_COMMUNITY)
Admission: EM | Admit: 2010-12-13 | Discharge: 2010-12-19 | DRG: 165 | Disposition: A | Discharge status: Home / Self Care | Payer: Medicare Other | Attending: Thoracic Surgery (Cardiothoracic Vascular Surgery) | Admitting: Thoracic Surgery (Cardiothoracic Vascular Surgery)

## 2010-12-13 ENCOUNTER — Other Ambulatory Visit: Payer: Self-pay

## 2010-12-13 ENCOUNTER — Inpatient Hospital Stay (HOSPITAL_COMMUNITY): Payer: Medicare Other

## 2010-12-13 ENCOUNTER — Encounter (HOSPITAL_COMMUNITY): Payer: Self-pay | Admitting: Emergency Medicine

## 2010-12-13 DIAGNOSIS — J449 Chronic obstructive pulmonary disease, unspecified: Secondary | ICD-10-CM | POA: Diagnosis present

## 2010-12-13 DIAGNOSIS — J939 Pneumothorax, unspecified: Secondary | ICD-10-CM

## 2010-12-13 DIAGNOSIS — Z87891 Personal history of nicotine dependence: Secondary | ICD-10-CM

## 2010-12-13 DIAGNOSIS — J4489 Other specified chronic obstructive pulmonary disease: Secondary | ICD-10-CM | POA: Diagnosis present

## 2010-12-13 DIAGNOSIS — J9383 Other pneumothorax: Principal | ICD-10-CM | POA: Diagnosis present

## 2010-12-13 DIAGNOSIS — Z7982 Long term (current) use of aspirin: Secondary | ICD-10-CM

## 2010-12-13 DIAGNOSIS — J930 Spontaneous tension pneumothorax: Secondary | ICD-10-CM

## 2010-12-13 DIAGNOSIS — I4891 Unspecified atrial fibrillation: Secondary | ICD-10-CM | POA: Diagnosis present

## 2010-12-13 LAB — POCT I-STAT TROPONIN I

## 2010-12-13 LAB — POCT I-STAT, CHEM 8
Creatinine, Ser: 1.3 mg/dL (ref 0.50–1.35)
Glucose, Bld: 115 mg/dL — ABNORMAL HIGH (ref 70–99)
Hemoglobin: 16.3 g/dL (ref 13.0–17.0)
Sodium: 142 mEq/L (ref 135–145)
TCO2: 25 mmol/L (ref 0–100)

## 2010-12-13 MED ORDER — SODIUM CHLORIDE 0.9 % IV SOLN
Freq: Once | INTRAVENOUS | Status: AC
  Administered 2010-12-13 (×2): via INTRAVENOUS

## 2010-12-13 MED ORDER — ASPIRIN 81 MG PO CHEW
81.0000 mg | CHEWABLE_TABLET | Freq: Every day | ORAL | Status: DC
  Administered 2010-12-16 – 2010-12-19 (×4): 81 mg via ORAL
  Filled 2010-12-13 (×7): qty 1

## 2010-12-13 MED ORDER — MORPHINE SULFATE 2 MG/ML IJ SOLN
2.0000 mg | Freq: Once | INTRAMUSCULAR | Status: AC
  Administered 2010-12-13: 2 mg via INTRAVENOUS
  Filled 2010-12-13: qty 1

## 2010-12-13 MED ORDER — OXYCODONE HCL 5 MG PO TABS
ORAL_TABLET | ORAL | Status: AC
  Administered 2010-12-13: 5 mg via ORAL
  Filled 2010-12-13: qty 1

## 2010-12-13 MED ORDER — PANTOPRAZOLE SODIUM 40 MG PO TBEC
40.0000 mg | DELAYED_RELEASE_TABLET | Freq: Every day | ORAL | Status: DC
  Administered 2010-12-13: 40 mg via ORAL
  Filled 2010-12-13: qty 1

## 2010-12-13 MED ORDER — METOPROLOL TARTRATE 12.5 MG HALF TABLET
12.5000 mg | ORAL_TABLET | Freq: Two times a day (BID) | ORAL | Status: DC
  Administered 2010-12-13 – 2010-12-19 (×10): 12.5 mg via ORAL
  Filled 2010-12-13 (×3): qty 1
  Filled 2010-12-13: qty 0.5
  Filled 2010-12-13 (×2): qty 1
  Filled 2010-12-13 (×2): qty 0.5
  Filled 2010-12-13 (×2): qty 1
  Filled 2010-12-13: qty 0.5
  Filled 2010-12-13: qty 1
  Filled 2010-12-13: qty 0.5
  Filled 2010-12-13 (×3): qty 1

## 2010-12-13 MED ORDER — PANTOPRAZOLE SODIUM 40 MG PO TBEC
40.0000 mg | DELAYED_RELEASE_TABLET | Freq: Every day | ORAL | Status: DC
  Administered 2010-12-14 – 2010-12-19 (×6): 40 mg via ORAL
  Filled 2010-12-13 (×6): qty 1

## 2010-12-13 MED ORDER — TRAZODONE HCL 150 MG PO TABS
150.0000 mg | ORAL_TABLET | Freq: Every day | ORAL | Status: DC
  Administered 2010-12-13 – 2010-12-18 (×5): 150 mg via ORAL
  Filled 2010-12-13 (×8): qty 1

## 2010-12-13 MED ORDER — OXYCODONE HCL 5 MG PO TABS
5.0000 mg | ORAL_TABLET | ORAL | Status: DC | PRN
  Administered 2010-12-13 (×2): 5 mg via ORAL
  Filled 2010-12-13 (×2): qty 1

## 2010-12-13 MED ORDER — MIDAZOLAM HCL 2 MG/2ML IJ SOLN
INTRAMUSCULAR | Status: AC
  Administered 2010-12-13: 2 mg
  Filled 2010-12-13: qty 2

## 2010-12-13 MED ORDER — OCUVITE PO TABS
1.0000 | ORAL_TABLET | Freq: Every day | ORAL | Status: DC
Start: 2010-12-13 — End: 2010-12-19
  Administered 2010-12-13 – 2010-12-19 (×6): 1 via ORAL
  Filled 2010-12-13 (×7): qty 1

## 2010-12-13 MED ORDER — IBUPROFEN 200 MG PO TABS
ORAL_TABLET | ORAL | Status: AC
  Administered 2010-12-13: 200 mg via ORAL
  Filled 2010-12-13: qty 1

## 2010-12-13 MED ORDER — IBUPROFEN 200 MG PO TABS
200.0000 mg | ORAL_TABLET | Freq: Four times a day (QID) | ORAL | Status: DC | PRN
  Administered 2010-12-13 – 2010-12-14 (×4): 200 mg via ORAL
  Filled 2010-12-13 (×4): qty 1

## 2010-12-13 NOTE — Progress Notes (Signed)
Subjective: Appears comfortable, no new complaints  Objective  Telemetry NSR, PVC's  Temp:  [97.2 F (36.2 C)-97.9 F (36.6 C)] 97.2 F (36.2 C) (11/26 0644) Pulse Rate:  [63-65] 64  (11/26 0644) Resp:  [15-20] 19  (11/26 0644) BP: (64-176)/(29-72) 149/72 mmHg (11/26 0644) SpO2:  [93 %-100 %] 93 % (11/26 0644) FiO2 (%):  [100 %] 100 % (11/26 0611) Weight:  [170 lb (77.111 kg)-172 lb 4.8 oz (78.155 kg)] 172 lb 4.8 oz (78.155 kg) (11/26 6283)   Intake/Output Summary (Last 24 hours) at 12/13/10 0916 Last data filed at 12/13/10 6629  Gross per 24 hour  Intake   1650 ml  Output      0 ml  Net   1650 ml   Chest tube: no air leak, small amount of serosang drainage    General appearance: alert, cooperative and no distress Heart: regular rate and rhythm Lungs: diminished in bases Abdomen: soft, non-tender; bowel sounds normal; no masses,  no organomegaly Extremities: extremities normal, atraumatic, no cyanosis or edema  Lab Results:  [facility] 12/13/10 0250  NA 142  K 4.1  CL 106  CO2 --  GLUCOSE 115*  BUN 21  CREATININE 1.30  CALCIUM --  MG --  PHOS --   No results found for this basename: AST:2,ALT:2,ALKPHOS:2,BILITOT:2,PROT:2,ALBUMIN:2 in the last 72 hours No results found for this basename: LIPASE:2,AMYLASE:2 in the last 72 hours  Basename 12/13/10 0250  WBC --  NEUTROABS --  HGB 16.3  HCT 48.0  MCV --  PLT --   No results found for this basename: CKTOTAL:4,CKMB:4,TROPONINI:4 in the last 72 hours No results found for this basename: POCBNP:3 in the last 72 hours No results found for this basename: DDIMER in the last 72 hours No results found for this basename: HGBA1C in the last 72 hours No results found for this basename: CHOL,HDL,LDLCALC,TRIG,CHOLHDL in the last 72 hours No results found for this basename: TSH,T4TOTAL,FREET3,T3FREE,THYROIDAB in the last 72 hours No results found for this basename: VITAMINB12,FOLATE,FERRITIN,TIBC,IRON,RETICCTPCT in  the last 72 hours  Medications: Scheduled    . sodium chloride   Intravenous Once  . aspirin  81 mg Oral Daily  . beta carotene w/minerals  1 tablet Oral Daily  . ibuprofen      . metoprolol tartrate  12.5 mg Oral BID  . midazolam      .  morphine injection  2 mg Intravenous Once  . oxyCODONE      . pantoprazole  40 mg Oral Q1200  . traZODone  150 mg Oral QHS     Radiology/Studies:  Dg Chest 2 View  12/13/2010  *RADIOLOGY REPORT*  Clinical Data: Left-sided chest pain; borderline diabetes.  CHEST - 2 VIEW  Comparison: Chest radiograph performed 10/19/2010  Findings: There is a small to moderate left-sided pneumothorax, measuring 25-30% of left lung volume, most prominent at the left lung base.  Underlying chronic lung changes are seen.  The right lung appears relatively clear.  Mild scarring is noted at the right lung apex.  No pleural effusion is seen.  The cardiomediastinal silhouette remains normal in size. Calcification is noted within the aortic arch.  No acute osseous abnormalities are identified in  IMPRESSION: New small to moderate left-sided pneumothorax, measuring 25-30% of left lung volume, most prominent at the left lung base.  Underlying chronic lung changes seen.  Findings were discussed with Dr. Leonard Schwartz at 03:01 a.m. on 12/13/2010.  Original Report Authenticated By: Santa Lighter, M.D.   Dg Chest Portable  1 View  12/13/2010  *RADIOLOGY REPORT*  Clinical Data: Repositioned left-sided chest tube.  PORTABLE CHEST - 1 VIEW  Comparison: Chest radiograph performed earlier today at 05:05 a.m.  Findings: There has been interval repositioning of the patient's left-sided chest tube, withdrawn to the left lung base.  There is resultant re-expansion of the left lung, with a small residual left basilar pneumothorax.  Mild residual atelectasis is noted at the left lung base.  The right lung appears essentially clear, aside from mild chronic underlying lung changes.  No pleural effusion  is identified.  The cardiomediastinal silhouette is normal in size; calcification is noted within the aortic arch.  No acute osseous abnormalities are seen.  IMPRESSION: Interval repositioning of left-sided chest tube, withdrawn to the left lung base.  Resultant re-expansion of the left lung, with a small residual left basilar pneumothorax seen.  Mild residual atelectasis noted at the left lung base.  Original Report Authenticated By: Santa Lighter, M.D.   Dg Chest Portable 1 View  12/13/2010  *RADIOLOGY REPORT*  Clinical Data: Status post left-sided chest tube placement.  PORTABLE CHEST - 1 VIEW  Comparison: Chest radiograph performed earlier today at 02:49 a.m.  Findings: There has been slight interval increase in the size of the patient's left-sided pneumothorax, status post left-sided chest tube placement.  The left-sided chest tube likely abuts the partially collapsed lung.  The pneumothorax now measures 30-35% of left lung volume.  The right lung remains relatively clear.  No pleural effusion is identified.  The cardiomediastinal silhouette is normal in size.  No significant mediastinal shift is seen.  No acute osseous abnormalities are appreciated.  IMPRESSION: Status post left-sided chest tube placement; slight interval increase in size of left-sided pneumothorax, measuring 30-35% of left lung volume.  The left-sided chest tube likely abuts the partially collapsed lung.  Findings were relayed to Dr. Gilford Raid at 05:12 a.m. on 12/13/2010.  Original Report Authenticated By: Santa Lighter, M.D.    INR: Will add last result for INR, ABG once components are confirmed Will add last 4 CBG results once components are confirmed  Assessment/Plan: S/P  chest tube placement for recurrent spontaneous pneumothorax. CXR shows small basilar pntx, but significantly  Improved. 1. Continue current suction with observation. May require VATS, stapling, pleurodesis as is second occurrence.    LOS: 0 days     [name] 11/26/20129:16 AM

## 2010-12-13 NOTE — ED Notes (Signed)
[place] at [facility], L chest tube placed, xray ordered, pending arrival.

## 2010-12-13 NOTE — H&P (Addendum)
[name] is an 77 y.o. male. 06/25/1928 QMV:[id number]   Chief Complaint: Left sided chest pain and increased shortness of breath.  HPI: This is an 77 year old Caucasian male who presented to [facility] emergency room early the morning of 12/13/2010 with complaints of left-sided chest pain and increasing shortness of breath.  According to the patient, he was in his usual state of health when he was watching TV.  He experienced left-sided chest pain and he thought this might be related to his heart so he took a baby aspirin. His pain did resolve. However, when the patient went to lie down to go to sleep, he experienced sudden onset of shortness of breath which then worsened.  It should be noted that the patient was admitted here on 09/04/2010 with similar symptoms and was found to have a spontaneous left pneumothorax.  Dr. Roxan Hockey placed the 28 French left chest tube. Daily chest x-rays were obtained area of the chest tube was eventually able to be placed to water seal then removed. Followup chest x-ray revealed no pneumothorax . During this admission, he also developed atrial fibrillation/RVR. A cardiology consult was obtained with Dr. Aundra Dubin.  He was given Amiodarone, started on Lopressor 25 mg bid, and ECASA 325 mg daily. He converted to sinus rhythm and he was discharged in stable condition on a 09/07/2010.    Past Medical History  Diagnosis Date  . Hyperlipidemia   . Spontaneous pneumothorax 09/04/2010    [name]  . Erectile dysfunction   . AF (atrial fibrillation)     after PTX 2012        Bradycardia (Lopressor decreased to 12.5 mg bid by Dr. Aundra Dubin)  Past Surgical History  Procedure Date  . Chest tube placement 09/04/2010        Cataract surgery  Family History: Non contributory. Social History:  He reports that he has quit smoking about 10 years ago. He does not have any smokeless tobacco history on file. He reports that he does not drink alcohol or use illicit  drugs. Review of Systems:Non contributory except as those previously stated.   Physical Exam: General:AAox3, NAD.Wife at bedside. Cardiovascular:RRR Pulmonary:Right lung clear;Slightly decreased breath sounds on left.  Rub present as CT in place.  No rales, wheezes, or rhonchi. Minor subcutaneous emphysema anterior lower left chest wall. Abdomen:Soft, non tender, non distended, bowel sounds present. Extremities:No cyanosis,clubbing,or edema.  Palpable pulses bilaterally. Neurologic:Cranial nerves II-XII grossly intact without focal deficits.  Allergies:  Allergies  Allergen Reactions  . Blistex     REACTION: lip swelling  . Diphenhydramine Hcl     REACTION: u/k  . Rosuvastatin     REACTION: myalgias at 59m/day dose     Medications Prior to Admission  Medication Sig Dispense Refill  . aspirin 81 MG tablet Take 81 mg by mouth daily.        . beta carotene w/minerals (OCUVITE) tablet Take 1 tablet by mouth daily.        . cyclobenzaprine (FLEXERIL) 10 MG tablet TAKE 1 TABLET BY MOUTH 3 TIMES A DAY AS NEEDED  30 tablet  3  . ibuprofen (ADVIL,MOTRIN) 200 MG tablet Take 200 mg by mouth every 6 (six) hours as needed. For pain      . metoprolol tartrate (LOPRESSOR) 25 MG tablet Take 0.5 tablets (12.5 mg total) by mouth 2 (two) times daily.  30 tablet  11  . omeprazole (PRILOSEC) 20 MG capsule Take 20 mg by mouth daily.        .Marland Kitchen  traZODone (DESYREL) 50 MG tablet Take 3 tablets (150 mg total) by mouth at bedtime. For insomnia        Results for orders placed during the hospital encounter of 12/13/10 (from the past 48 hour(s))  POCT I-STAT TROPONIN I     Status: Normal   Collection Time   12/13/10  2:48 AM      Component Value Range Comment   Troponin i, poc 0.00  0.00 - 0.08 (ng/mL)    Comment 3            POCT I-STAT, CHEM 8     Status: Abnormal   Collection Time   12/13/10  2:50 AM      Component Value Range Comment   Sodium 142  135 - 145 (mEq/L)    Potassium 4.1  3.5 - 5.1  (mEq/L)    Chloride 106  96 - 112 (mEq/L)    BUN 21  6 - 23 (mg/dL)    Creatinine, Ser 1.30  0.50 - 1.35 (mg/dL)    Glucose, Bld 115 (*) 70 - 99 (mg/dL)    Calcium, Ion 1.11 (*) 1.12 - 1.32 (mmol/L)    TCO2 25  0 - 100 (mmol/L)    Hemoglobin 16.3  13.0 - 17.0 (g/dL)    HCT 48.0  39.0 - 52.0 (%)    Dg Chest 2 View  12/13/2010  *RADIOLOGY REPORT*  Clinical Data: Left-sided chest pain; borderline diabetes.  CHEST - 2 VIEW  Comparison: Chest radiograph performed 10/19/2010  Findings: There is a small to moderate left-sided pneumothorax, measuring 25-30% of left lung volume, most prominent at the left lung base.  Underlying chronic lung changes are seen.  The right lung appears relatively clear.  Mild scarring is noted at the right lung apex.  No pleural effusion is seen.  The cardiomediastinal silhouette remains normal in size. Calcification is noted within the aortic arch.  No acute osseous abnormalities are identified in  IMPRESSION: New small to moderate left-sided pneumothorax, measuring 25-30% of left lung volume, most prominent at the left lung base.  Underlying chronic lung changes seen.  Findings were discussed with Dr. Leonard Schwartz at 03:01 a.m. on 12/13/2010.  Original Report Authenticated By: Santa Lighter, M.D.   Dg Chest Portable 1 View  12/13/2010  *RADIOLOGY REPORT*  Clinical Data: Repositioned left-sided chest tube.  PORTABLE CHEST - 1 VIEW  Comparison: Chest radiograph performed earlier today at 05:05 a.m.  Findings: There has been interval repositioning of the patient's left-sided chest tube, withdrawn to the left lung base.  There is resultant re-expansion of the left lung, with a small residual left basilar pneumothorax.  Mild residual atelectasis is noted at the left lung base.  The right lung appears essentially clear, aside from mild chronic underlying lung changes.  No pleural effusion is identified.  The cardiomediastinal silhouette is normal in size; calcification is noted  within the aortic arch.  No acute osseous abnormalities are seen.  IMPRESSION: Interval repositioning of left-sided chest tube, withdrawn to the left lung base.  Resultant re-expansion of the left lung, with a small residual left basilar pneumothorax seen.  Mild residual atelectasis noted at the left lung base.  Original Report Authenticated By: Santa Lighter, M.D.   Dg Chest Portable 1 View  12/13/2010  *RADIOLOGY REPORT*  Clinical Data: Status post left-sided chest tube placement.  PORTABLE CHEST - 1 VIEW  Comparison: Chest radiograph performed earlier today at 02:49 a.m.  Findings: There has been slight interval increase in  the size of the patient's left-sided pneumothorax, status post left-sided chest tube placement.  The left-sided chest tube likely abuts the partially collapsed lung.  The pneumothorax now measures 30-35% of left lung volume.  The right lung remains relatively clear.  No pleural effusion is identified.  The cardiomediastinal silhouette is normal in size.  No significant mediastinal shift is seen.  No acute osseous abnormalities are appreciated.  IMPRESSION: Status post left-sided chest tube placement; slight interval increase in size of left-sided pneumothorax, measuring 30-35% of left lung volume.  The left-sided chest tube likely abuts the partially collapsed lung.  Findings were relayed to Dr. Gilford Raid at 05:12 a.m. on 12/13/2010.  Original Report Authenticated By: Santa Lighter, M.D.     Blood pressure 112/66, pulse 69, temperature 97.9 F (36.6 C), temperature source Oral, resp. rate 20, height _0  (1.803 m), weight 172 lb 4.8 oz (78.155 kg), SpO2 91.00%.   Assessment/Plan  Patient had a 65 French left chest tube placed by Dr. Cyndia Bent on 12/13/2010. Chest tube has been placed to waterseal.  Follow up CXR, revealed a small residual left basilar pneumothorax.  Patient will need a CT scan of the chest, as this is a recurrent left spontaneous pneumothorax.  He will likely  require a left vats/possible stapling of blebs.  Nani Skillern, PA 12/13/2010, 6:23 PM    Pt seen and examined and chest tube placed by Dr. Cyndia Bent.  Will need CT chest, then Left VATS, blebectomy

## 2010-12-13 NOTE — Progress Notes (Signed)
[name] was readmitted this AM with a recurrent L spontaneous pneumothorax. CT placed by Dr. Cyndia Bent.  Currently no air leak. Stable clinically.  Given that this is a recurrence, surgical intervention is warranted. He needs a CT chest to evaluate the extent of emphysema/ blebs to plan surgery. i have ordered CT.

## 2010-12-13 NOTE — ED Notes (Signed)
Tolerated L chest tube placement well, NAD, calm, reports "right much pain", resting with eyes closed, interactive, follows directions, resps e/guarded/shallow, LS CTA on R & diminished, LS  On L gurgle/ bubble & diminished. VSS.

## 2010-12-13 NOTE — ED Notes (Signed)
Xray at [facility] VSS, remain improved

## 2010-12-13 NOTE — ED Notes (Signed)
No changes, "feels better", pain decreased, VSS,  resting comfortably at [facility] laughing and speaking with wife at [facility], no air leaks noted, remains on 20sonometers of suction, report called to [place], going to 2007 on monitor, O2 & stretcher.

## 2010-12-13 NOTE — Progress Notes (Signed)
   CARE MANAGEMENT NOTE 12/13/2010  Patient:  [name], [name]   Account Number:  [id number]  Date Initiated:  12/13/2010  Documentation initiated by:  [facility]  Subjective/Objective Assessment:   Admitted  with pneumo - CT inserted.     Action/Plan:   PTA, PT INDEPENDENT, LIVES WITH SPOUSE.   Anticipated DC Date:  12/13/2010   Anticipated DC Plan:  [place]  CM consult      Choice offered to / List presented to:             Status of service:  In process, will continue to follow Medicare Important Message given?   (If response is "NO", the following Medicare IM given date fields will be blank) Date Medicare IM given:   Date Additional Medicare IM given:    Discharge Disposition:    Per UR Regulation:  Reviewed for med. necessity/level of care/duration of stay  Comments:  12/13/10  [place] [facility]50 PT WITH SPONTANEOUS PTX REQUIRING CHEST TUBE PLACEMENT...MET WITH PT TO DISCUSS DC PLANS.  STATES WIFE CAN PROVIDE CARE AT DISCHARGE.  WILL FOLLOW FOR HOME NEEDS AS PT PROGRESSES. Phone [phone]  12-13-10 8:50am Luz Lex, RNBSN [phone] UR Completed.

## 2010-12-13 NOTE — ED Provider Notes (Signed)
History     CSN: [id number] Arrival date & time: 12/13/2010  1:38 AM   First MD Initiated Contact with Patient 12/13/10 0217      Chief Complaint  Patient presents with  . Shortness of Breath    shortness of breath and pain in the left side of the chest since around 2230 hour tonight. patient denies pain at this time. patient has history of collapsed lung on August and wanted to make sure it has not happen again    (Consider location/radiation/quality/duration/timing/severity/associated sxs/prior treatment) HPI  Patient presents with left-sided chest pain which started around 10:30 tonight.  Patient's states that he had a pneumothorax on that same side in August.  Was also concerned that this wasn't a heart problem.  Patient denies diaphoresis, nausea, vomiting.  Patient has a history of spontaneous pneumothorax in the past.  Also has hyperlipidemia.  Past Medical History  Diagnosis Date  . Hyperlipidemia   . Spontaneous pneumothorax 09/04/2010    [name]  . Erectile dysfunction   . AF (atrial fibrillation)     after PTX 2012    Past Surgical History  Procedure Date  . Chest tube placement 09/04/2010    History reviewed. No pertinent family history.  History  Substance Use Topics  . Smoking status: Former Research scientist (life sciences)  . Smokeless tobacco: Not on file  . Alcohol Use: No      Review of Systems Remaining review of systems all negative except as mentioned in the history of present illness Allergies  Blistex; Diphenhydramine hcl; and Rosuvastatin  Home Medications   Current Outpatient Rx  Name Route Sig Dispense Refill  . ASPIRIN 81 MG PO TABS Oral Take 81 mg by mouth daily.      . OCUVITE PO TABS Oral Take 1 tablet by mouth daily.      . CYCLOBENZAPRINE HCL 10 MG PO TABS  TAKE 1 TABLET BY MOUTH 3 TIMES A DAY AS NEEDED 30 tablet 3  . IBUPROFEN 200 MG PO TABS Oral Take 200 mg by mouth every 6 (six) hours as needed. For pain    . METOPROLOL TARTRATE 25 MG PO TABS  Oral Take 0.5 tablets (12.5 mg total) by mouth 2 (two) times daily. 30 tablet 11  . OMEPRAZOLE 20 MG PO CPDR Oral Take 20 mg by mouth daily.      . TRAZODONE HCL 50 MG PO TABS Oral Take 3 tablets (150 mg total) by mouth at bedtime. For insomnia      BP 176/72  Pulse 63  Temp(Src) 97.9 F (36.6 C) (Oral)  Resp 18  Ht _0  (1.803 m)  Wt 170 lb (77.111 kg)  BMI 23.71 kg/m2  SpO2 96%  Physical Exam  Nursing note and vitals reviewed. Constitutional: He is oriented to person, place, and time. He appears well-developed and well-nourished. No distress.  HENT:  Head: Normocephalic and atraumatic.  Eyes: Pupils are equal, round, and reactive to light.  Neck: Normal range of motion.  Cardiovascular: Normal rate and intact distal pulses.          Date: 12/13/2010  Rate: 55  Rhythm: normal sinus rhythm  QRS Axis: normal  Intervals: normal  ST/T Wave abnormalities: normal  Conduction Disutrbances:right bundle branch block:   Old EKG Reviewed: unchanged morphology.  Rate has improved     Pulmonary/Chest: No respiratory distress.  Abdominal: Normal appearance. He exhibits no distension.  Musculoskeletal: Normal range of motion.  Neurological: He is alert and oriented to person, place, and time.  No cranial nerve deficit.  Skin: Skin is warm and dry. No rash noted.  Psychiatric: He has a normal mood and affect. His behavior is normal.    ED Course  Procedures (including critical care time)  Labs Reviewed  POCT I-STAT, CHEM 8 - Abnormal; Notable for the following:    Glucose, Bld 115 (*)    Calcium, Ion 1.11 (*)    All other components within normal limits  POCT I-STAT TROPONIN I  I-STAT, CHEM 8  I-STAT TROPONIN I   Dg Chest 2 View  12/13/2010  *RADIOLOGY REPORT*  Clinical Data: Left-sided chest pain; borderline diabetes.  CHEST - 2 VIEW  Comparison: Chest radiograph performed 10/19/2010  Findings: There is a small to moderate left-sided pneumothorax, measuring 25-30% of left  lung volume, most prominent at the left lung base.  Underlying chronic lung changes are seen.  The right lung appears relatively clear.  Mild scarring is noted at the right lung apex.  No pleural effusion is seen.  The cardiomediastinal silhouette remains normal in size. Calcification is noted within the aortic arch.  No acute osseous abnormalities are identified in  IMPRESSION: New small to moderate left-sided pneumothorax, measuring 25-30% of left lung volume, most prominent at the left lung base.  Underlying chronic lung changes seen.  Findings were discussed with Dr. Leonard Schwartz at 03:01 a.m. on 12/13/2010.  Original Report Authenticated By: Santa Lighter, M.D.     Diagnosis: 1 left-sided pneumothorax    MDM   Plan: Call be placed to cardiothoracic surgery for consultation and definitive care       Dot Lanes, MD 12/13/10 1334

## 2010-12-13 NOTE — Procedures (Signed)
Chest Tube Insertion Procedure Note  Indications:  Clinically significant Pneumothorax  Pre-operative Diagnosis: Pneumothorax  Post-operative Diagnosis: Pneumothorax  Procedure Details  Informed consent was obtained for the procedure, including sedation.  Risks of lung perforation, hemorrhage, arrhythmia, and adverse drug reaction were discussed.   After sterile skin prep, using standard technique, a 20 French tube was placed in the left lateral 8th rib space.  Findings:    Estimated Blood Loss:  Minimal         Specimens:  None              Complications:  None; patient tolerated the procedure well.         Disposition: admit to 2000         Condition: stable  Attending Attestation: I performed the procedure.

## 2010-12-13 NOTE — ED Notes (Signed)
L chest tube re-positioned by Dr. Cyndia Bent, pt tolerated well, xray ordered pending arrival, LS improved, LS CTA bilateral with improved air flow/movement, continues to c/o pain. BP improved.

## 2010-12-13 NOTE — ED Notes (Signed)
Dr. Cyndia Bent at [facility] to pull chest tube back. BP drop noted.

## 2010-12-14 ENCOUNTER — Inpatient Hospital Stay (HOSPITAL_COMMUNITY): Payer: Medicare Other

## 2010-12-14 DIAGNOSIS — J930 Spontaneous tension pneumothorax: Secondary | ICD-10-CM

## 2010-12-14 LAB — COMPREHENSIVE METABOLIC PANEL
ALT: 15 U/L (ref 0–53)
AST: 18 U/L (ref 0–37)
CO2: 23 mEq/L (ref 19–32)
Chloride: 104 mEq/L (ref 96–112)
GFR calc non Af Amer: 58 mL/min — ABNORMAL LOW (ref >90)
Sodium: 137 mEq/L (ref 135–145)
Total Bilirubin: 0.7 mg/dL (ref 0.3–1.2)

## 2010-12-14 LAB — BLOOD GAS, ARTERIAL
Bicarbonate: 22.7 mEq/L (ref 20.0–24.0)
TCO2: 23.8 mmol/L (ref 0–100)
pCO2 arterial: 36.2 mmHg (ref 35.0–45.0)
pH, Arterial: 7.414 (ref 7.350–7.450)

## 2010-12-14 LAB — URINALYSIS, ROUTINE W REFLEX MICROSCOPIC
Bilirubin Urine: NEGATIVE
Glucose, UA: NEGATIVE mg/dL
Hgb urine dipstick: NEGATIVE
Nitrite: NEGATIVE
Specific Gravity, Urine: 1.009 (ref 1.005–1.030)
pH: 5.5 (ref 5.0–8.0)

## 2010-12-14 LAB — TYPE AND SCREEN
ABO/RH(D): A POS
Antibody Screen: NEGATIVE

## 2010-12-14 LAB — ABO/RH: ABO/RH(D): A POS

## 2010-12-14 LAB — CBC
Hemoglobin: 14.8 g/dL (ref 13.0–17.0)
MCV: 102.9 fL — ABNORMAL HIGH (ref 78.0–100.0)
Platelets: 266 10*3/uL (ref 150–400)
RBC: 4.2 MIL/uL — ABNORMAL LOW (ref 4.22–5.81)
WBC: 9.1 10*3/uL (ref 4.0–10.5)

## 2010-12-14 MED ORDER — DEXTROSE 5 % IV SOLN
1.5000 g | INTRAVENOUS | Status: AC
  Administered 2010-12-15: 1.5 g via INTRAVENOUS
  Filled 2010-12-14: qty 1.5

## 2010-12-14 NOTE — Progress Notes (Signed)
  Subjective: No complaints. Minimal discomfort, no shortness of breath.  Objective: Vital signs in last 24 hours: Temp:  [97.3 F (36.3 C)-98.4 F (36.9 C)] 97.3 F (36.3 C) (11/27 1437) Pulse Rate:  [64-70] 66  (11/27 1437) Cardiac Rhythm:  [-] Normal sinus rhythm;Heart block (11/27 0847) Resp:  [18-19] 19  (11/27 1437) BP: (128-148)/(67-83) 148/74 mmHg (11/27 1437) SpO2:  [90 %-93 %] 90 % (11/27 1437)  Hemodynamic parameters for last 24 hours:    Intake/Output from previous day: 11/26 0701 - 11/27 0700 In: 680 [P.O.:680] Out: 1880 [Urine:1810; Chest Tube:70] Intake/Output this shift: Total I/O In: 480 [P.O.:480] Out: 500 [Urine:500]  General appearance: alert and no distress Lungs: equal BS, no wheezing Ct no air leak  Lab Results:  Basename 12/13/10 0250  WBC --  HGB 16.3  HCT 48.0  PLT --   BMET:  Basename 12/13/10 0250  NA 142  K 4.1  CL 106  CO2 --  GLUCOSE 115*  BUN 21  CREATININE 1.30  CALCIUM --    PT/INR: No results found for this basename: LABPROT,INR in the last 72 hours ABG    Component Value Date/Time   TCO2 25 12/13/2010 0250   CBG (last 3)  No results found for this basename: GLUCAP:3 in the last 72 hours  Assessment/Plan: S/P   Recurrent left spontaneous pneumothorax Lung reexpanded with CT in place, no shortness of breath Ct chest shows diffuse centrilobular emphysema bilaterally. Large blebs/ bullae on left one measuring 19 cm! I have recommended to [name] that he should have a left VATS, blebectomy and pleural abrasion because of his extremely high risk for repeated pneumothoraces. I have discussed with him and his wife the indications, risks, benefits and alternatives. They understand there is a 95% chance of procedural success. They understand the risks include but are not limited to death, MI, DVT, PE, bleeding, possible need for transfusion, infection, prolonged air leak, atrial fib, or other unforeseeable  complications. He accepts risks and agrees to proceed. For OR tomorrow AM. All questions answered.   LOS: 1 day    [name] 12/14/2010

## 2010-12-15 ENCOUNTER — Encounter (HOSPITAL_COMMUNITY)
Admission: EM | Disposition: A | Discharge status: Home / Self Care | Payer: Self-pay | Source: Home / Self Care | Attending: Surgery

## 2010-12-15 ENCOUNTER — Other Ambulatory Visit: Payer: Self-pay

## 2010-12-15 ENCOUNTER — Other Ambulatory Visit: Payer: Self-pay | Admitting: Thoracic Surgery (Cardiothoracic Vascular Surgery)

## 2010-12-15 ENCOUNTER — Inpatient Hospital Stay (HOSPITAL_COMMUNITY): Payer: Medicare Other

## 2010-12-15 ENCOUNTER — Inpatient Hospital Stay (HOSPITAL_COMMUNITY): Payer: Medicare Other | Admitting: Anesthesiology

## 2010-12-15 ENCOUNTER — Encounter (HOSPITAL_COMMUNITY): Payer: Self-pay | Admitting: Anesthesiology

## 2010-12-15 DIAGNOSIS — J930 Spontaneous tension pneumothorax: Secondary | ICD-10-CM

## 2010-12-15 HISTORY — PX: VIDEO ASSISTED THORACOSCOPY: SHX5073

## 2010-12-15 SURGERY — VIDEO ASSISTED THORACOSCOPY
Anesthesia: General | Site: Chest | Laterality: Left | Wound class: Clean Contaminated

## 2010-12-15 MED ORDER — BISACODYL 5 MG PO TBEC
10.0000 mg | DELAYED_RELEASE_TABLET | Freq: Every day | ORAL | Status: DC
  Administered 2010-12-16 – 2010-12-17 (×2): 10 mg via ORAL
  Filled 2010-12-15 (×2): qty 2

## 2010-12-15 MED ORDER — PHENYLEPHRINE HCL 10 MG/ML IJ SOLN
10.0000 mg | INTRAVENOUS | Status: DC | PRN
  Administered 2010-12-15: 20 ug/min via INTRAVENOUS

## 2010-12-15 MED ORDER — ONDANSETRON HCL 4 MG/2ML IJ SOLN: 4.0000 mg | Freq: Four times a day (QID) | INTRAMUSCULAR | Status: DC | PRN

## 2010-12-15 MED ORDER — ROCURONIUM BROMIDE 100 MG/10ML IV SOLN
INTRAVENOUS | Status: DC | PRN
  Administered 2010-12-15: 75 mg via INTRAVENOUS

## 2010-12-15 MED ORDER — SODIUM CHLORIDE 0.9 % IR SOLN
Status: DC | PRN
  Administered 2010-12-15: 2000 mL

## 2010-12-15 MED ORDER — POTASSIUM CHLORIDE 10 MEQ/50ML IV SOLN
10.0000 meq | Freq: Every day | INTRAVENOUS | Status: DC | PRN
  Administered 2010-12-17 (×3): 10 meq via INTRAVENOUS
  Filled 2010-12-15 (×3): qty 50

## 2010-12-15 MED ORDER — MIDAZOLAM HCL 5 MG/5ML IJ SOLN
INTRAMUSCULAR | Status: DC | PRN
  Administered 2010-12-15: 2 mg via INTRAVENOUS
  Administered 2010-12-15: 0.5 mg via INTRAVENOUS

## 2010-12-15 MED ORDER — TRAMADOL HCL 50 MG PO TABS: 50.0000 mg | ORAL_TABLET | Freq: Four times a day (QID) | ORAL | Status: DC | PRN

## 2010-12-15 MED ORDER — FLUMAZENIL 0.5 MG/5ML IV SOLN
0.5000 mg | Freq: Once | INTRAVENOUS | Status: DC
  Filled 2010-12-15: qty 5

## 2010-12-15 MED ORDER — ACETAMINOPHEN 10 MG/ML IV SOLN
1000.0000 mg | Freq: Four times a day (QID) | INTRAVENOUS | Status: AC
  Administered 2010-12-15 – 2010-12-16 (×4): 1000 mg via INTRAVENOUS
  Filled 2010-12-15 (×5): qty 100

## 2010-12-15 MED ORDER — NALOXONE HCL 0.4 MG/ML IJ SOLN: 0.4000 mg | INTRAMUSCULAR | Status: DC | PRN

## 2010-12-15 MED ORDER — ONDANSETRON HCL 4 MG/2ML IJ SOLN: 4.0000 mg | Freq: Once | INTRAMUSCULAR | Status: DC | PRN

## 2010-12-15 MED ORDER — DIPHENHYDRAMINE HCL 12.5 MG/5ML PO ELIX
12.5000 mg | ORAL_SOLUTION | Freq: Four times a day (QID) | ORAL | Status: DC | PRN
  Filled 2010-12-15: qty 5

## 2010-12-15 MED ORDER — LACTATED RINGERS IV SOLN: INTRAVENOUS | Status: DC

## 2010-12-15 MED ORDER — FENTANYL CITRATE 0.05 MG/ML IJ SOLN
INTRAMUSCULAR | Status: DC | PRN
  Administered 2010-12-15: 25 ug via INTRAVENOUS
  Administered 2010-12-15: 250 ug via INTRAVENOUS

## 2010-12-15 MED ORDER — PROPOFOL 10 MG/ML IV EMUL
INTRAVENOUS | Status: DC | PRN
  Administered 2010-12-15: 200 mg via INTRAVENOUS

## 2010-12-15 MED ORDER — DEXTROSE 5 % IV SOLN
INTRAVENOUS | Status: DC | PRN
  Administered 2010-12-15: 09:00:00 via INTRAVENOUS

## 2010-12-15 MED ORDER — GLYCOPYRROLATE 0.2 MG/ML IJ SOLN
INTRAMUSCULAR | Status: DC | PRN
  Administered 2010-12-15: 0.2 mg via INTRAVENOUS
  Administered 2010-12-15: .8 mg via INTRAVENOUS

## 2010-12-15 MED ORDER — DEXTROSE 5 % IV SOLN
1.5000 g | Freq: Two times a day (BID) | INTRAVENOUS | Status: AC
  Administered 2010-12-15 – 2010-12-16 (×2): 1.5 g via INTRAVENOUS
  Filled 2010-12-15 (×2): qty 1.5

## 2010-12-15 MED ORDER — SODIUM CHLORIDE 0.9 % IJ SOLN: 9.0000 mL | INTRAMUSCULAR | Status: DC | PRN

## 2010-12-15 MED ORDER — LACTATED RINGERS IV SOLN
INTRAVENOUS | Status: DC | PRN
  Administered 2010-12-15 (×2): via INTRAVENOUS

## 2010-12-15 MED ORDER — OXYCODONE HCL 5 MG PO TABS: 5.0000 mg | ORAL_TABLET | ORAL | Status: AC | PRN

## 2010-12-15 MED ORDER — OXYCODONE-ACETAMINOPHEN 5-325 MG PO TABS: 1.0000 | ORAL_TABLET | ORAL | Status: DC | PRN

## 2010-12-15 MED ORDER — ALBUTEROL SULFATE HFA 108 (90 BASE) MCG/ACT IN AERS
4.0000 | INHALATION_SPRAY | Freq: Four times a day (QID) | RESPIRATORY_TRACT | Status: AC
  Administered 2010-12-15 – 2010-12-17 (×7): 4 via RESPIRATORY_TRACT
  Filled 2010-12-15: qty 6.7

## 2010-12-15 MED ORDER — HYDROMORPHONE HCL PF 1 MG/ML IJ SOLN
0.2500 mg | INTRAMUSCULAR | Status: DC | PRN
  Administered 2010-12-15 (×2): 0.5 mg via INTRAVENOUS

## 2010-12-15 MED ORDER — NEOSTIGMINE METHYLSULFATE 1 MG/ML IJ SOLN
INTRAMUSCULAR | Status: DC | PRN
  Administered 2010-12-15: 4 mg via INTRAVENOUS

## 2010-12-15 MED ORDER — PROPOFOL 10 MG/ML IV EMUL
INTRAVENOUS | Status: DC | PRN
  Administered 2010-12-15: 75 ug/kg/min via INTRAVENOUS

## 2010-12-15 MED ORDER — DIPHENHYDRAMINE HCL 50 MG/ML IJ SOLN: 12.5000 mg | Freq: Four times a day (QID) | INTRAMUSCULAR | Status: DC | PRN

## 2010-12-15 MED ORDER — KCL IN DEXTROSE-NACL 20-5-0.45 MEQ/L-%-% IV SOLN
INTRAVENOUS | Status: DC
  Administered 2010-12-15: 17:00:00 via INTRAVENOUS
  Filled 2010-12-15 (×3): qty 1000

## 2010-12-15 MED ORDER — SENNOSIDES-DOCUSATE SODIUM 8.6-50 MG PO TABS
1.0000 | ORAL_TABLET | Freq: Every evening | ORAL | Status: DC | PRN
  Filled 2010-12-15: qty 1

## 2010-12-15 MED ORDER — ONDANSETRON HCL 4 MG/2ML IJ SOLN
INTRAMUSCULAR | Status: DC | PRN
  Administered 2010-12-15: 4 mg via INTRAVENOUS

## 2010-12-15 MED ORDER — FENTANYL 10 MCG/ML IV SOLN
INTRAVENOUS | Status: DC
  Administered 2010-12-15: 90 ug via INTRAVENOUS
  Administered 2010-12-15: 190 ug via INTRAVENOUS
  Administered 2010-12-15: 150 ug via INTRAVENOUS
  Administered 2010-12-15: 10 ug/h via INTRAVENOUS
  Administered 2010-12-15: 90 ug via INTRAVENOUS
  Administered 2010-12-16: 30 ug via INTRAVENOUS
  Administered 2010-12-16: 80 ug via INTRAVENOUS
  Administered 2010-12-16: 110 ug via INTRAVENOUS
  Administered 2010-12-16: 40 ug via INTRAVENOUS
  Administered 2010-12-16: 120 ug via INTRAVENOUS
  Administered 2010-12-16: 80 ug via INTRAVENOUS
  Administered 2010-12-17: 140 ug via INTRAVENOUS
  Administered 2010-12-17: 110 ug via INTRAVENOUS
  Filled 2010-12-15 (×4): qty 50

## 2010-12-15 MED ORDER — HETASTARCH-ELECTROLYTES 6 % IV SOLN
INTRAVENOUS | Status: DC | PRN
  Administered 2010-12-15: 09:00:00 via INTRAVENOUS

## 2010-12-15 SURGICAL SUPPLY — 62 items
APPLIER CLIP ROT 10 11.4 M/L (STAPLE)
CANISTER SUCTION 2500CC (MISCELLANEOUS) ×4 IMPLANT
CATH ROBINSON RED A/P 22FR (CATHETERS) IMPLANT
CATH THORACIC 28FR (CATHETERS) ×2 IMPLANT
CATH THORACIC 28FR RT ANG (CATHETERS) ×2 IMPLANT
CATH THORACIC 36FR (CATHETERS) IMPLANT
CATH THORACIC 36FR RT ANG (CATHETERS) IMPLANT
CLIP APPLIE ROT 10 11.4 M/L (STAPLE) IMPLANT
CLIP TI LARGE 6 (CLIP) IMPLANT
CLOTH BEACON ORANGE TIMEOUT ST (SAFETY) ×2 IMPLANT
CONN Y 3/8X3/8X3/8  BEN (MISCELLANEOUS) ×1
CONN Y 3/8X3/8X3/8 BEN (MISCELLANEOUS) ×1 IMPLANT
CONT SPEC 4OZ CLIKSEAL STRL BL (MISCELLANEOUS) ×8 IMPLANT
DRAPE LAPAROSCOPIC ABDOMINAL (DRAPES) ×2 IMPLANT
DRAPE WARM FLUID 44X44 (DRAPE) IMPLANT
ELECT REM PT RETURN 9FT ADLT (ELECTROSURGICAL) ×2
ELECTRODE REM PT RTRN 9FT ADLT (ELECTROSURGICAL) ×1 IMPLANT
GLOVE EUDERMIC 7 POWDERFREE (GLOVE) ×6 IMPLANT
GOWN STRL NON-REIN LRG LVL3 (GOWN DISPOSABLE) ×8 IMPLANT
KIT BASIN OR (CUSTOM PROCEDURE TRAY) ×2 IMPLANT
KIT ROOM TURNOVER OR (KITS) ×2 IMPLANT
KIT SUCTION CATH 14FR (SUCTIONS) ×2 IMPLANT
NS IRRIG 1000ML POUR BTL (IV SOLUTION) ×4 IMPLANT
PACK CHEST (CUSTOM PROCEDURE TRAY) ×2 IMPLANT
PAD ARMBOARD 7.5X6 YLW CONV (MISCELLANEOUS) ×6 IMPLANT
RELOAD EGIA 45 MED/THCK PURPLE (STAPLE) ×2 IMPLANT
RELOAD EGIA 45 TAN VASC (STAPLE) ×6 IMPLANT
RELOAD EGIA 60 MED/THCK PURPLE (STAPLE) ×4 IMPLANT
RELOAD EGIA 60 TAN VASC (STAPLE) ×2 IMPLANT
SEALANT PROGEL (MISCELLANEOUS) ×2 IMPLANT
SEALANT SURG COSEAL 8ML (VASCULAR PRODUCTS) IMPLANT
SOLUTION ANTI FOG 6CC (MISCELLANEOUS) ×2 IMPLANT
SPECIMEN JAR LG PLASTIC EMPTY (MISCELLANEOUS) IMPLANT
SPECIMEN JAR MEDIUM (MISCELLANEOUS) IMPLANT
SPONGE GAUZE 4X4 12PLY (GAUZE/BANDAGES/DRESSINGS) ×2 IMPLANT
STAPLER VISISTAT 35W (STAPLE) IMPLANT
SUCTION POOLE TIP (SUCTIONS) IMPLANT
SUT PROLENE 4 0 RB 1 (SUTURE)
SUT PROLENE 4-0 RB1 .5 CRCL 36 (SUTURE) IMPLANT
SUT SILK  1 MH (SUTURE) ×4
SUT SILK 1 MH (SUTURE) ×4 IMPLANT
SUT SILK 2 0SH CR/8 30 (SUTURE) IMPLANT
SUT SILK 3 0SH CR/8 30 (SUTURE) IMPLANT
SUT VIC AB 2-0 CT1 27 (SUTURE)
SUT VIC AB 2-0 CT1 TAPERPNT 27 (SUTURE) IMPLANT
SUT VIC AB 2-0 CTX 36 (SUTURE) IMPLANT
SUT VIC AB 2-0 UR6 27 (SUTURE) ×4 IMPLANT
SUT VIC AB 3-0 SH 18 (SUTURE) IMPLANT
SUT VIC AB 3-0 SH 27 (SUTURE)
SUT VIC AB 3-0 SH 27X BRD (SUTURE) IMPLANT
SUT VIC AB 3-0 X1 27 (SUTURE) ×4 IMPLANT
SUT VICRYL 0 UR6 27IN ABS (SUTURE) IMPLANT
SUT VICRYL 2 TP 1 (SUTURE) IMPLANT
SWAB COLLECTION DEVICE MRSA (MISCELLANEOUS) IMPLANT
SYSTEM SAHARA CHEST DRAIN ATS (WOUND CARE) ×2 IMPLANT
TOWEL OR 17X24 6PK STRL BLUE (TOWEL DISPOSABLE) ×2 IMPLANT
TOWEL OR 17X26 10 PK STRL BLUE (TOWEL DISPOSABLE) ×2 IMPLANT
TRAP SPECIMEN MUCOUS 40CC (MISCELLANEOUS) IMPLANT
TRAY FOLEY CATH 14FR (SET/KITS/TRAYS/PACK) ×2 IMPLANT
TUBE ANAEROBIC SPECIMEN COL (MISCELLANEOUS) IMPLANT
TUNNELER SHEATH ON-Q 11GX8 (MISCELLANEOUS) ×2 IMPLANT
WATER STERILE IRR 1000ML POUR (IV SOLUTION) ×4 IMPLANT

## 2010-12-15 NOTE — Transfer of Care (Signed)
Immediate Anesthesia Transfer of Care Note  Patient: [name] [facility]  Procedure(s) Performed:  VIDEO ASSISTED THORACOSCOPY - blebectomy with pleural abrasion  Patient Location: PACU  Anesthesia Type: General  Level of Consciousness: awake, alert  and oriented  Airway & Oxygen Therapy: Patient Spontanous Breathing and Patient connected to face mask oxygen  Post-op Assessment: Report given to PACU RN, Post -op Vital signs reviewed and stable and Patient moving all extremities  Post vital signs: Reviewed and stable  Complications: No apparent anesthesia complications

## 2010-12-15 NOTE — Transfer of Care (Signed)
Immediate Anesthesia Transfer of Care Note  Patient: [name] [facility]  Procedure(s) Performed:  VIDEO ASSISTED THORACOSCOPY - blebectomy with pleural abrasion  Patient Location: PACU  Anesthesia Type: General  Level of Consciousness: awake, alert  and oriented  Airway & Oxygen Therapy: Patient Spontanous Breathing and Patient connected to face mask oxygen  Post-op Assessment: Report given to PACU RN, Post -op Vital signs reviewed and stable and Patient moving all extremities  Post vital signs: Reviewed and stable  Complications: No apparent anesthesia complications

## 2010-12-15 NOTE — Anesthesia Postprocedure Evaluation (Signed)
  Anesthesia Post-op Note  Patient: [name]  Procedure(s) Performed:  VIDEO ASSISTED THORACOSCOPY - blebectomy with pleural abrasion  Patient Location: PACU  Anesthesia Type: General  Level of Consciousness: awake, oriented, sedated and patient cooperative  Airway and Oxygen Therapy: Patient Spontanous Breathing and Patient connected to nasal cannula oxygen  Post-op Pain: moderate  Post-op Assessment: Post-op Vital signs reviewed, Patient's Cardiovascular Status Stable, Respiratory Function Stable, Patent Airway, No signs of Nausea or vomiting and Pain level controlled  Post-op Vital Signs: stable  Complications: No apparent anesthesia complications

## 2010-12-15 NOTE — Preoperative (Addendum)
Beta Blockers   Reason not to administer Beta Blockers:Not Applicable 

## 2010-12-15 NOTE — Anesthesia Preprocedure Evaluation (Signed)
Anesthesia Evaluation  Patient identified by MRN, date of birth, ID band Patient awake    Reviewed: Allergy & Precautions, H&P , NPO status , Patient's Chart, lab work & pertinent test results  History of Anesthesia Complications (+) Family history of anesthesia reaction  Airway Mallampati: I TM Distance: >3 FB Neck ROM: full    Dental  (+) Teeth Intact and Implants   Pulmonary    Pulmonary exam normal       Cardiovascular + dysrhythmias regular Normal    Neuro/Psych PSYCHIATRIC DISORDERS Negative Neurological ROS     GI/Hepatic GERD-  ,  Endo/Other  Negative Endocrine ROS  Renal/GU negative Renal ROS  Genitourinary negative   Musculoskeletal   Abdominal   Peds  Hematology   Anesthesia Other Findings   Reproductive/Obstetrics                           Anesthesia Physical Anesthesia Plan  ASA: III  Anesthesia Plan: General   Post-op Pain Management:    Induction: Intravenous  Airway Management Planned: Double Lumen EBT  Additional Equipment: Arterial line and CVP  Intra-op Plan:   Post-operative Plan:   Informed Consent:   Plan Discussed with: Anesthesiologist, CRNA and Surgeon  Anesthesia Plan Comments:         Anesthesia Quick Evaluation

## 2010-12-15 NOTE — Anesthesia Procedure Notes (Addendum)
Performed by: Warrick Parisian    Date/Time: 12/15/2010 9:37 AM Performed by: Carl Best K    Procedure Name: Intubation Date/Time: 12/15/2010 8:56 AM Performed by: Carl Best K Pre-anesthesia Checklist: Patient identified, Timeout performed, Emergency Drugs available, Suction available and Patient being monitored Patient Re-evaluated:Patient Re-evaluated prior to inductionOxygen Delivery Method: Circle System Utilized Preoxygenation: Pre-oxygenation with 100% oxygen Intubation Type: IV induction Ventilation: Mask ventilation without difficulty Laryngoscope Size: Mac and 4 Grade View: Grade II Tube type: Oral Endobronchial tube: Left and 39 Fr Number of attempts: 1 Airway Equipment and Method: stylet and fiberoptic brochoscope Placement Confirmation: ETT inserted through vocal cords under direct vision,  breath sounds checked- equal and bilateral and positive ETCO2 Secured at: 24 cm Tube secured with: Tape Dental Injury: Teeth and Oropharynx as per pre-operative assessment

## 2010-12-15 NOTE — Brief Op Note (Signed)
12/13/2010 - 12/15/2010  10:19 AM  PATIENT:  [name]  77 y.o. male  PRE-OPERATIVE DIAGNOSIS:  spontaneous pneumothorax  POST-OPERATIVE DIAGNOSIS:  spontaneous pneumothorax  PROCEDURE:  Procedure(s): LEFT VIDEO ASSISTED THORACOSCOPY, bleb resections x 4, pleurectomy, mechanical pleurodesis  SURGEON:  Surgeon(s): Melrose Nakayama, MD  PHYSICIAN ASSISTANT:  Suzzanne Cloud, PA-C  ANESTHESIA:   general  SPECIMEN:  Source of Specimen:  Left lung, pleura  DISPOSITION OF SPECIMEN:  Pathology  DRAINS: 28 Fr Chest Tube(s) in the left chest   PATIENT CONDITION:  PACU - hemodynamically stable.

## 2010-12-15 NOTE — Op Note (Signed)
NAMEDIERKS, WACH NO.:  [id number]  MEDICAL RECORD NO.:  [id number]  LOCATION:  2007                         FACILITY:  [place]  PHYSICIAN:  Revonda Standard. Roxan Hockey, M.D.DATE OF BIRTH:  1928/11/10  DATE OF PROCEDURE:  12/15/2010 DATE OF DISCHARGE:                              OPERATIVE REPORT   PREOPERATIVE DIAGNOSIS:  Recurrent left spontaneous pneumothorax.  POSTOPERATIVE DIAGNOSIS:  Recurrent left spontaneous pneumothorax.  PROCEDURE:  Left video-assisted thoracoscopy, blebectomy x3, pleural stripping and abrasion.  SURGEON:  Revonda Standard. Roxan Hockey, M.D.  ASSISTANCE:  Suzzanne Cloud, P.A.  ANESTHESIA:  General.  FINDINGS:  Large bleb (12 cm), arising from left lower lobe.  Small bleb in the anterior aspect of the left upper lobe.  Confluent blebs in the lateral aspect of the left upper lobe.  Diffuse emphysematous change.  CLINICAL INDICATION:  Mr. Bulnes is an 77 year old gentleman with history of COPD and a previous left-sided spontaneous pneumothorax.  He presented again with shortness of breath and chest discomfort, and he was seen by Dr. Cyndia Bent.  A chest tube was placed for a recurrent spontaneous pneumothorax.  CT scan was done, which showed multiple blebs, particularly a large 12-cm bleb in the left chest.  The patient was advised to undergo definitive surgical treatment with blebectomy and pleural abrasion and stripping for treatment of recurrent pneumothorax. The indications, risks, benefits and alternatives were discussed in detail with the patient.  He understood the risks as outlined in the progress notes, accepted them and agreed to proceed.  OPERATIVE NOTE:  Mr. Tift was brought to the preop holding area on December 15, 2010.  There, the Anesthesia Service placed an arterial blood pressure monitoring line and established intravenous access. Intravenous antibiotics were administered.  PAS hose were placed for DVT prophylaxis.  The  patient was taken to the operating room, anesthetized, and intubated with a double-lumen endotracheal tube.  He was placed in a right lateral decubitus position.  Single lung ventilation of the right lung was carried out and the left-sided chest tube was removed, and the left chest was prepped and draped in usual sterile fashion.  An incision was made in the midaxillary line approximately at 7th intercostal space, was carried through the skin and subcutaneous tissue.  The chest was entered bluntly using a hemostat and a port was inserted and the thoracoscope was placed into the chest.  There was good isolation of the left lung.  There was a large bleb inferiorly.  This was consistent with a 12-cm bleb on the CT scan.  Additional port incisions were made anteriorly and posteriorly for instrumentation.  The large bleb was grasped and deflated.  This bleb was arising from the anterior-inferior aspect of the left lower lobe.  It was excised with a firing of a 60-mm Endo-GIA stapler.  The specimen was removed and it appeared there was some residual bleb right at the staple line, so a second staple line was performed just below the first to ensure that all of the bleb was removed.  These were sent as a single specimen.  The remainder of the left lower lobe appeared emphysematous, but there were no distinct blebs.  Inspection of the upper lobe  revealed some adhesions at the apex.  These were taken down to allow complete inspection of the upper lobe.  There was a small bleb approximately 2 cm anteriorly and this was excised with sequential firings of Endo-GIA stapler.  There was an area of confluent blebs in the midportion of the left upper lobe.  This area likewise was excised with sequential firings of an Endo-GIA stapler.  All staple lines were inspected.  There was good hemostasis.  The left lung was slowly inflated and no other significant blebs were noted.  ProGel was applied to the staple  lines. The pleura then was stripped circumferentially in the superior part of the chest. In the lower portion of the chest, the pleura was lightly abraded with adhesion formation.  A 28-French chest tube was placed through the original port incision and directed towards the apex.  The remaining 2 incisions were closed with 2-0 Vicryl fascial sutures and 3- 0 Vicryl subcuticular sutures.  All sponge, needle and instrument counts were correct at the end of procedure.  The patient was taken from the operating room to the [place] [facility] in good condition.     Revonda Standard Roxan Hockey, M.D.     SCH/MEDQ  D:  12/15/2010  T:  12/15/2010  Job:  [id number]

## 2010-12-16 ENCOUNTER — Inpatient Hospital Stay (HOSPITAL_COMMUNITY): Payer: Medicare Other

## 2010-12-16 LAB — BLOOD GAS, ARTERIAL
Acid-Base Excess: 0.8 mmol/L (ref 0.0–2.0)
Drawn by: 352351
O2 Content: 3 L/min
pCO2 arterial: 39.6 mmHg (ref 35.0–45.0)
pO2, Arterial: 59.8 mmHg — ABNORMAL LOW (ref 80.0–100.0)

## 2010-12-16 LAB — CBC
Hemoglobin: 13 g/dL (ref 13.0–17.0)
MCH: 35.7 pg — ABNORMAL HIGH (ref 26.0–34.0)
MCV: 103 fL — ABNORMAL HIGH (ref 78.0–100.0)
RBC: 3.64 MIL/uL — ABNORMAL LOW (ref 4.22–5.81)

## 2010-12-16 LAB — BASIC METABOLIC PANEL
BUN: 13 mg/dL (ref 6–23)
Chloride: 102 mEq/L (ref 96–112)
GFR calc Af Amer: 75 mL/min — ABNORMAL LOW (ref >90)
Potassium: 3.8 mEq/L (ref 3.5–5.1)
Sodium: 135 mEq/L (ref 135–145)

## 2010-12-16 LAB — MRSA PCR SCREENING: MRSA by PCR: NEGATIVE

## 2010-12-16 MED ORDER — SODIUM CHLORIDE 0.9 % IJ SOLN
INTRAMUSCULAR | Status: AC
  Filled 2010-12-16: qty 10

## 2010-12-16 NOTE — Progress Notes (Signed)
UR Completed.  Vergie Living [phone] 12/16/2010

## 2010-12-16 NOTE — Progress Notes (Addendum)
1 Day Post-Op Procedure(s) (LRB): VIDEO ASSISTED THORACOSCOPY (Left)  Subjective: OOB in chair.  Sore, but otherwise feeling well.  Breathing stable.   Objective: Vital signs in last 24 hours: Patient Vitals for the past 24 hrs:  BP Temp Temp src Pulse Resp SpO2 Height Weight  12/16/10 0331 115/67 mmHg 97.8 F (36.6 C) Oral 82  20  95 % - -  12/15/10 2311 118/56 mmHg 98.1 F (36.7 C) Oral 98  23  91 % - -  12/15/10 2222 - - - 101  - - - -  12/15/10 2029 - - - - 15  96 % - -  12/15/10 2008 114/75 mmHg 98.1 F (36.7 C) Oral 97  22  94 % - -  12/15/10 1750 149/85 mmHg - - 89  21  95 % - -  12/15/10 1443 - - - - - - _0  (1.803 m) 176 lb 5.9 oz (80 kg)  12/15/10 1435 149/83 mmHg 98.4 F (36.9 C) Oral 91  19  92 % - -  12/15/10 1433 151/79 mmHg - - - 21  - - -  12/15/10 1413 - - - - 15  96 % - -  12/15/10 1356 138/85 mmHg - - 59  15  93 % - -  12/15/10 1341 147/84 mmHg - - 58  17  95 % - -  12/15/10 1330 - 97.1 F (36.2 C) - - - - - -  12/15/10 1326 133/77 mmHg - - 54  16  95 % - -  12/15/10 1311 146/81 mmHg - - 75  22  94 % - -  12/15/10 1241 153/68 mmHg - - 61  17  93 % - -  12/15/10 1226 140/65 mmHg - - 55  16  92 % - -  12/15/10 1211 135/66 mmHg - - 63  18  89 % - -  12/15/10 1156 159/89 mmHg - - 71  18  95 % - -  12/15/10 1141 149/71 mmHg - - 54  19  91 % - -  12/15/10 1112 155/65 mmHg - - 51  17  98 % - -  12/15/10 1109 - - - - 17  99 % - -  12/15/10 1056 146/85 mmHg - - 57  24  99 % - -  12/15/10 1041 150/67 mmHg - - 71  26  97 % - -  12/15/10 1035 - 96.8 F (36 C) - - - 98 % - -   Current Weight  12/15/10 176 lb 5.9 oz (80 kg)     Intake/Output from previous day: 11/28 0701 - 11/29 0700 In: 4296.7 [I.V.:3696.7; IV Piggyback:600] Out: 2125 [Urine:1775; Blood:50; Chest Tube:300]    PHYSICAL EXAM:  Heart: RRR Lungs: slightly decreased BS in Rt base Wound: dressed and dry  Chest tube: no air leak  Lab Results: CBC: Basename 12/16/10 0430 12/14/10 1638   WBC 11.3* 9.1  HGB 13.0 14.8  HCT 37.5* 43.2  PLT 209 266   BMET:  Basename 12/16/10 0430 12/14/10 1638  NA 135 137  K 3.8 4.2  CL 102 104  CO2 26 23  GLUCOSE 141* 103*  BUN 13 18  CREATININE 1.05 1.16  CALCIUM 7.8* 8.9    PT/INR:  Basename 12/14/10 1638  LABPROT 14.0  INR 1.06  Chest x-ray: bilateral atelectasis, no pneumothorax  Assessment/Plan: S/P Procedure(s) (LRB): VIDEO ASSISTED THORACOSCOPY (Left)  No air leak.  Continue chest tube to suction for now.  Poss  consider decreasing to water seal soon. Mobilize, work on [employer]. D/C a-line, leave [place] for today.   LOS: 3 days    COLLINS,GINA H 12/16/2010   CT to water seal in AM prior to CXR If CXR OK with CT on water seal, d/c tube

## 2010-12-17 ENCOUNTER — Inpatient Hospital Stay (HOSPITAL_COMMUNITY): Payer: Medicare Other

## 2010-12-17 ENCOUNTER — Encounter (HOSPITAL_COMMUNITY): Payer: Self-pay | Admitting: Thoracic Surgery (Cardiothoracic Vascular Surgery)

## 2010-12-17 LAB — COMPREHENSIVE METABOLIC PANEL
ALT: 11 U/L (ref 0–53)
AST: 15 U/L (ref 0–37)
Alkaline Phosphatase: 55 U/L (ref 39–117)
CO2: 27 mEq/L (ref 19–32)
Chloride: 104 mEq/L (ref 96–112)
GFR calc Af Amer: >90 mL/min (ref >90)
GFR calc non Af Amer: 78 mL/min — ABNORMAL LOW (ref >90)
Glucose, Bld: 134 mg/dL — ABNORMAL HIGH (ref 70–99)
Potassium: 3.7 mEq/L (ref 3.5–5.1)
Sodium: 138 mEq/L (ref 135–145)
Total Bilirubin: 0.8 mg/dL (ref 0.3–1.2)

## 2010-12-17 LAB — CBC
Hemoglobin: 13 g/dL (ref 13.0–17.0)
MCH: 35.6 pg — ABNORMAL HIGH (ref 26.0–34.0)
RBC: 3.65 MIL/uL — ABNORMAL LOW (ref 4.22–5.81)
WBC: 12.1 10*3/uL — ABNORMAL HIGH (ref 4.0–10.5)

## 2010-12-17 MED ORDER — OXYCODONE-ACETAMINOPHEN 5-325 MG PO TABS: 1.0000 | ORAL_TABLET | ORAL | Status: AC | PRN

## 2010-12-17 NOTE — Progress Notes (Signed)
Nursing note-  Fentanyl PCA dc'ed per order.  22cc wasted in sink by me and witnessed by Earnestine Leys, Therapist, sports.

## 2010-12-17 NOTE — Progress Notes (Addendum)
2 Days Post-Op Procedure(s) (LRB): VIDEO ASSISTED THORACOSCOPY (Left)  Subjective: Feels well, no complaints.  Breathing stable.  Walked in halls yesterday.  Objective: Vital signs in last 24 hours: Patient Vitals for the past 24 hrs:  BP Temp Temp src Pulse Resp SpO2  12/17/10 0500 - - - - - 96 %  12/17/10 0400 130/66 mmHg 98.1 F (36.7 C) Oral - - -  12/17/10 0000 134/71 mmHg 98 F (36.7 C) Oral - - -  12/16/10 2132 - - - - - 97 %  12/16/10 1955 138/64 mmHg 97.7 F (36.5 C) Oral 91  24  95 %  12/16/10 1555 133/63 mmHg 97.7 F (36.5 C) Oral 70  18  95 %  12/16/10 1550 - - - - 18  96 %  12/16/10 1531 - - - - - 95 %  12/16/10 1200 126/61 mmHg 98.1 F (36.7 C) Oral 70  20  96 %  12/16/10 1100 120/61 mmHg 97.1 F (36.2 C) Oral 68  22  97 %  12/16/10 0858 - - - - - 96 %   Current Weight  12/15/10 176 lb 5.9 oz (80 kg)     Intake/Output from previous day: 11/29 0701 - 11/30 0700 In: 841.7 [I.V.:741.7; IV Piggyback:100] Out: 3695 [Urine:3325; Chest Tube:370]    PHYSICAL EXAM:  Heart: RRR, tachy 100s Wound: Clean and dry,small amount serosanguinous drainage around CT  Chest tube: no air leak  Lab Results: CBC: Basename 12/17/10 0530 12/16/10 0430  WBC 12.1* 11.3*  HGB 13.0 13.0  HCT 37.6* 37.5*  PLT 205 209   BMET:  Basename 12/17/10 0530 12/16/10 0430  NA 138 135  K 3.7 3.8  CL 104 102  CO2 27 26  GLUCOSE 134* 141*  BUN 12 13  CREATININE 0.91 1.05  CALCIUM 8.4 7.8*    PT/INR:  Basename 12/14/10 1638  LABPROT 14.0  INR 1.06   Chest x-ray: small lateral pneumothorax   Assessment/Plan: S/P Procedure(s) (LRB): VIDEO ASSISTED THORACOSCOPY (Left)  Will d/c chest tube this am. D/C IVF, PCA, mobilize, continue pulm toilet Home 1-2 days if stable   LOS: 4 days    COLLINS,GINA H 12/17/2010   CT out, No complaints CXR had a vertical "line" but did not appear to be a ptx, since there was no air leak, CT was removed D/C PCA Wean O2 Possibly  home in AM if off O2

## 2010-12-18 ENCOUNTER — Inpatient Hospital Stay (HOSPITAL_COMMUNITY): Payer: Medicare Other

## 2010-12-18 MED ORDER — DEXTROSE 5 % IV SOLN
150.0000 mg | Freq: Once | INTRAVENOUS | Status: DC
  Filled 2010-12-18 (×2): qty 3

## 2010-12-18 MED ORDER — SODIUM CHLORIDE 0.9 % IJ SOLN
INTRAMUSCULAR | Status: AC
  Administered 2010-12-18: 14:00:00
  Filled 2010-12-18: qty 20

## 2010-12-18 MED ORDER — DEXTROSE 5 % IV SOLN
60.0000 mg/h | INTRAVENOUS | Status: AC
  Administered 2010-12-18: 60 mg/h via INTRAVENOUS
  Filled 2010-12-18 (×2): qty 9

## 2010-12-18 MED ORDER — AMIODARONE HCL 200 MG PO TABS
200.0000 mg | ORAL_TABLET | Freq: Two times a day (BID) | ORAL | Status: DC
  Administered 2010-12-18 – 2010-12-19 (×3): 200 mg via ORAL
  Filled 2010-12-18 (×4): qty 1

## 2010-12-18 MED ORDER — DEXTROSE 5 % IV SOLN: 30.0000 mg/h | INTRAVENOUS | Status: DC

## 2010-12-18 MED ORDER — DEXTROSE 5 % IV SOLN
30.0000 mg/h | INTRAVENOUS | Status: DC
  Filled 2010-12-18: qty 9

## 2010-12-18 MED ORDER — AMIODARONE LOAD VIA INFUSION
150.0000 mg | Freq: Once | INTRAVENOUS | Status: DC
  Administered 2010-12-18: 150 mg via INTRAVENOUS

## 2010-12-18 MED ORDER — DEXTROSE 5 % IV SOLN: 60.0000 mg/h | INTRAVENOUS | Status: DC

## 2010-12-18 NOTE — Progress Notes (Addendum)
3 Days Post-Op Procedure(s) (LRB): VIDEO ASSISTED THORACOSCOPY (Left)  Subjective:   Objective: Vital signs in last 24 hours: Patient Vitals for the past 24 hrs:  BP Temp Temp src Pulse Resp SpO2  12/18/10 0738 114/71 mmHg 97.7 F (36.5 C) Oral 116  22  94 %  12/18/10 0300 93/66 mmHg 98.2 F (36.8 C) Oral 108  22  93 %  12/17/10 2300 138/68 mmHg 98.5 F (36.9 C) Oral 81  22  92 %  12/17/10 1900 121/66 mmHg 97.9 F (36.6 C) Oral 100  28  92 %  12/17/10 1600 131/73 mmHg - Oral 96  21  96 %  12/17/10 1200 119/81 mmHg 98.1 F (36.7 C) Oral 106  30  96 %    Current Weight  12/15/10 176 lb 5.9 oz (80 kg)       Intake/Output from previous day: 11/30 0701 - 12/01 0700 In: 1230 [P.O.:1080; IV Piggyback:150] Out: 1700 [Urine:1600; Chest Tube:100]   Physical Exam:  Cardiovascular: RRR, no murmurs, gallops, or rubs. Pulmonary: Clear to auscultation bilaterally; no rales, wheezes, or rhonchi. Abdomen: Soft, non tender, bowel sounds present. Extremities: Mild bilateral lower extremity edema. Wounds: Clean and dry.  No erythema or signs of infection.  Lab Results: CBC: Basename 12/17/10 0530 12/16/10 0430  WBC 12.1* 11.3*  HGB 13.0 13.0  HCT 37.6* 37.5*  PLT 205 209   BMET:  Basename 12/17/10 0530 12/16/10 0430  NA 138 135  K 3.7 3.8  CL 104 102  CO2 27 26  GLUCOSE 134* 141*  BUN 12 13  CREATININE 0.91 1.05  CALCIUM 8.4 7.8*    PT/INR: No results found for this basename: LABPROT,INR in the last 72 hours ABG:  INR: Will add last result for INR, ABG once components are confirmed Will add last 4 CBG results once components are confirmed  Assessment/Plan:  1.CV-Afib with RVR earlier this am. Converted to SR and HR is 60's. Will start Amiodarone 200 bid andstop Amiodarone gttp. Continue with Lopressor with parameters. It should be known that when patient was admitted for previous left spont ptx, he went into afib and was seen by Dr. Aundra Dubin.  Will have patient follow  up with him as an outpatient. 2.Pulmonary-CXR today shows NO pneumothorax, some improvement in aeration on left, small amount of subcutaneous emphysema left lat chest wall, and COPD changes. Has been weaned down to 1 L O2 via [place].  Will probably need home O2 upon discharge (history of COPD). 3.Likely discharge in the am.   Nani Skillern, PA 12/18/2010   I have seen and examined the patient and agree with the assessment and plan as outlined.  OWEN,CLARENCE H 12/18/2010 2:04 PM

## 2010-12-19 ENCOUNTER — Inpatient Hospital Stay (HOSPITAL_COMMUNITY): Payer: Medicare Other

## 2010-12-19 MED ORDER — AMIODARONE HCL 200 MG PO TABS: 200.0000 mg | ORAL_TABLET | Freq: Two times a day (BID) | ORAL | Status: DC

## 2010-12-19 NOTE — Progress Notes (Signed)
Pt d/c instructions given, prescriptions given, d/c instruction teaching done, family at [facility], both family and pt verbally acknowledged understanding of d/c instructions, all questions answered

## 2010-12-19 NOTE — Progress Notes (Addendum)
4 Days Post-Op Procedure(s) (LRB): VIDEO ASSISTED THORACOSCOPY (Left)  Subjective: Patient without complaints. He wants to go home.  Objective: Vital signs in last 24 hours: Patient Vitals for the past 24 hrs:  BP Temp Temp src Pulse Resp SpO2  12/19/10 0915 - - - 89  - -  12/19/10 0800 135/77 mmHg 97.3 F (36.3 C) Oral 80  24  91 %  12/19/10 0400 122/63 mmHg 98.5 F (36.9 C) Oral 115  17  92 %  12/19/10 0330 122/63 mmHg - - 76  25  91 %  12/19/10 0000 135/93 mmHg 98.5 F (36.9 C) Oral 81  21  93 %  12/18/10 1920 125/65 mmHg 98 F (36.7 C) Oral 56  21  94 %  12/18/10 1549 101/53 mmHg 98.4 F (36.9 C) Oral 61  25  93 %  12/18/10 1139 106/60 mmHg 97.3 F (36.3 C) Oral 66  26  95 %    Current Weight  12/15/10 176 lb 5.9 oz (80 kg)       Intake/Output from previous day: 12/01 0701 - 12/02 0700 In: 410.9 [I.V.:410.9] Out: 975 [Urine:975]   Physical Exam:  Cardiovascular: RRR, no murmurs, gallops, or rubs. Pulmonary: Clear to auscultation bilaterally; no rales, wheezes, or rhonchi. Abdomen: Soft, non tender, bowel sounds present. Wounds: Clean and dry.  No erythema or signs of infection.  Lab Results: CBC:  Basename 12/17/10 0530  WBC 12.1*  HGB 13.0  HCT 37.6*  PLT 205   BMET:   Basename 12/17/10 0530  NA 138  K 3.7  CL 104  CO2 27  GLUCOSE 134*  BUN 12  CREATININE 0.91  CALCIUM 8.4    PT/INR: No results found for this basename: LABPROT,INR in the last 72 hours ABG:  INR: Will add last result for INR, ABG once components are confirmed Will add last 4 CBG results once components are confirmed  Assessment/Plan:  1.CV-Previous Afib with RVR . Maintaining SR. Continue current medications. 2.Pulmonary-CXR stable.  Weaned off O2 12/1. 3.Remove central line 4.Discharge today.   Nani Skillern, PA 12/19/2010    ZIMMERMAN,DONIELLE M 12/19/2010 10:04 AM

## 2010-12-22 NOTE — Discharge Summary (Addendum)
Physician Discharge Summary  Patient ID: [name] [facility], [place] MRN: [id number] DOB/AGE: 1928-06-25 77 y.o.  Admit date: 12/13/2010 Discharge date: 12/19/2010  Admission Diagnoses:  1.Left recurrent,spontaneous pneumothorax. 2.History of atrial fibrillation 3.History of hyperlipidemia 4.History of tobacco abuse 5.History of bradycardia  Discharge Diagnoses:   1.Left recurrent,spontaneous pneumothorax. 2.History of atrial fibrillation 3.History of hyperlipidemia 4.History of tobacco abuse 5.History of bradycardia   Procedure (s): Left video-assisted thoracoscopy, blebectomy x3, pleural  stripping and abrasion by Dr. Roxan Hockey on 12/15/2010.  Pathology: Left lower lobe blebs/bullae showed acute and chronic inflammation; NO atypia or malignancy present. Oral biopsy showed benign pleural but reactive mesothelial lining and chronic inflammation; NO atypia or malignancy.   History of Presenting Illness: This is an 77 year old Caucasian male who presented to [facility] emergency room early the morning of 12/13/2010, with complaints of left-sided chest pain and increasing shortness of breath. According to the patient, he was in his usual state of health when he was watching TV. He experienced left-sided chest pain and he thought this might be related to his heart so he took a baby aspirin. His pain did resolve;however, when the patient went to lie down to go to sleep, he experienced sudden onset of shortness of breath, which then worsened. It should be noted that the patient was admitted here on 09/04/2010 with similar symptoms and was found to have a spontaneous left pneumothorax. Dr. Roxan Hockey placed the 28 French left chest tube. Daily chest x-rays were obtained area of the chest tube was eventually able to be placed to water seal then removed. Followup chest x-ray revealed no pneumothorax . During this admission, he also developed atrial fibrillation/RVR. A cardiology consult was obtained  with Dr. Aundra Dubin. He was given Amiodarone, started on Lopressor 25 mg bid, and ECASA 325 mg daily. He converted to sinus rhythm and he was discharged in stable condition on a 09/07/2010.       Chest xray done 12/13/2010 showed a small to moderate left-sided pneumothorax in (measuring 25-30%); right lung clear, and underlying chronic lung changes seen. Dr. Cyndia Bent then placed a 20 French chest tube in the left lateral eighth rib space. Followup chest x-ray revealed the chest tube abutted the left lung and the patient still had a significant size left pneumothorax. As a result, the chest tube was repositioned. Followup chest x-ray then revealed re expansion of the left lung with a small residual left basilar pneumothorax.      A CAT scan of the chest was then obtained which showed diffuse central lobular emphysema bilaterally, large bleb/bullae (1 on the left measuring 19 cm). Dr. Roxan Hockey that had a discussion with the patient regarding the necessitation for a left vats, blebectomy, pleural abrasion because of his extremely high-risk for repeated pneumothoraces. Potential risks, complications, and benefits were discussed with the patient and he agreed to proceed with surgery. He underwent a left vats, blebectomy x3, and pleural stripping an abrasion by Dr. Roxan Hockey on 12/15/2010.   Brief Hospital Course:  Patient remained afebrile and hemodynamically stable. A line and foley were removed early in his post operative course.His chest tube was not found to have an air leak. It was placed to water seal and then removed on 12/17/2010. PCA was also discontinued on this date. Followup chest x-ray revealed no pneumothorax, some improvement aeration on the left a small amount of subcutaneous emphysema on the left lateral chest wall, and COPD changes.  He was still requiring a liter of oxygen via nasal cannula; however,  he was able  be weaned to room air. The patient had been previously sinus rhythm however he  developed atrial fibrillation with RVR early the morning of 11/18/2010. He was given an Amiodarone bolus followed by a drip. He then converted to sinus rhythm. He was placed on amiodarone 200 mg po twice  Daily and his Lopressor 12.5 mg by mouth 3 times a day will also be continued upon discharge. His chest x-ray remain stable on 12/19/2010. His central line was removed and he was felt surgically stable for discharge on this day. That he was artery tolerating a diet and had had a bowel movement.   Filed Vitals:   12/19/10 0915  BP:   Pulse: 89  Temp: 97.3  Resp: 24     Latest Vital Signs: Blood pressure 135/77, pulse 89, temperature 97.3 F (36.3 C), temperature source Oral, resp. rate 24, height _0  (1.803 m), weight 176 lb 5.9 oz (80 kg), SpO2 91.00%.  Physical Exam:  Cardiovascular: RRR, no murmurs, gallops, or rubs.  Pulmonary: Clear to auscultation bilaterally; no rales, wheezes, or rhonchi.  Abdomen: Soft, non tender, bowel sounds present.  Wounds: Clean and dry. No erythema or signs of infection.  Discharge Condition:Stable  Recent laboratory studies:  Lab Results  Component Value Date   WBC 12.1* 12/17/2010   HGB 13.0 12/17/2010   HCT 37.6* 12/17/2010   MCV 103.0* 12/17/2010   PLT 205 12/17/2010   Lab Results  Component Value Date   NA 138 12/17/2010   K 3.7 12/17/2010   CL 104 12/17/2010   CO2 27 12/17/2010   CREATININE 0.91 12/17/2010   GLUCOSE 134* 12/17/2010      Diagnostic Studies: Dg Chest 2 View  12/19/2010  *RADIOLOGY REPORT*  Clinical Data: Recurrent left spontaneous pneumothorax.  CHEST - 2 VIEW  Comparison: 12/18/2010  Findings: Stable right IJ central catheter. Patchy infiltrate with basilar atelectasis on the left is stable.  Chronic changes of COPD.  No definite pneumothorax. Mild amount of subcutaneous air along the left chest wall persists.  Unchanged chronic mid thoracic compression fracture. Stable heart size.  Stable mediastinal contour.   IMPRESSION:  Stable chest.  Original Report Authenticated By: Staci Righter, M.D.    Discharge Orders    Future Appointments: Provider: Department: Dept Phone: Center:   12/29/2010 2:00 PM Melrose Nakayama, MD [facility] [phone] [id number]   01/05/2011 11:30 AM [name], MD [place] [phone] [id number]      Discharge Medications: Discharge Medication List as of 12/19/2010 11:13 AM    START taking these medications   Details  amiodarone (PACERONE) 200 MG tablet Take 1 tablet (200 mg total) by mouth 2 (two) times daily., Starting 12/19/2010, Until Mon 12/19/11, Print    oxyCODONE-acetaminophen (PERCOCET) 5-325 MG per tablet Take 1-2 tablets by mouth every 4 (four) hours as needed for pain., Starting 12/17/2010, Until Mon 12/27/10, Print      CONTINUE these medications which have NOT CHANGED   Details  aspirin 81 MG tablet Take 81 mg by mouth daily.  , Until Discontinued, Historical Med    beta carotene w/minerals (OCUVITE) tablet Take 1 tablet by mouth daily.  , Until Discontinued, Historical Med    cyclobenzaprine (FLEXERIL) 10 MG tablet TAKE 1 TABLET BY MOUTH 3 TIMES A DAY AS NEEDED, Normal    metoprolol tartrate (LOPRESSOR) 25 MG tablet Take 0.5 tablets (12.5 mg total) by mouth 2 (two) times daily., Starting 10/04/2010, Until Discontinued, Normal  omeprazole (PRILOSEC) 20 MG capsule Take 20 mg by mouth daily.  , Until Discontinued, Historical Med    traZODone (DESYREL) 50 MG tablet Take 3 tablets (150 mg total) by mouth at bedtime. For insomnia, Starting 09/28/2010, Until Discontinued, No Print      STOP taking these medications     ibuprofen (ADVIL,MOTRIN) 200 MG tablet         Follow Up Appointments: Follow-up Information    Follow up with Melrose Nakayama, MD on 12/29/2010. (Have a chest x-ray at 1:15, then see MD at 2:00)    Contact information:   [place] [phone]       Make  an appointment with [name], MD. (Call for an appointment for 2 weeks)    Contact information:   [address] [place] [phone]          Signed: Nani Skillern, PA 12/22/2010, 1:32 PM

## 2010-12-27 ENCOUNTER — Other Ambulatory Visit: Payer: Self-pay | Admitting: Thoracic Surgery (Cardiothoracic Vascular Surgery)

## 2010-12-27 DIAGNOSIS — D381 Neoplasm of uncertain behavior of trachea, bronchus and lung: Secondary | ICD-10-CM

## 2010-12-29 ENCOUNTER — Ambulatory Visit
Admission: RE | Admit: 2010-12-29 | Discharge: 2010-12-29 | Disposition: A | Discharge status: Home / Self Care | Payer: Medicare Other | Source: Ambulatory Visit | Attending: Thoracic Surgery (Cardiothoracic Vascular Surgery) | Admitting: Thoracic Surgery (Cardiothoracic Vascular Surgery)

## 2010-12-29 ENCOUNTER — Encounter: Payer: Self-pay | Admitting: Thoracic Surgery (Cardiothoracic Vascular Surgery)

## 2010-12-29 ENCOUNTER — Ambulatory Visit (INDEPENDENT_AMBULATORY_CARE_PROVIDER_SITE_OTHER): Payer: Self-pay | Admitting: Thoracic Surgery (Cardiothoracic Vascular Surgery)

## 2010-12-29 VITALS — BP 105/60 | HR 55 | Resp 20 | Ht 71.0 in | Wt 175.0 lb

## 2010-12-29 DIAGNOSIS — J9383 Other pneumothorax: Secondary | ICD-10-CM

## 2010-12-29 DIAGNOSIS — D381 Neoplasm of uncertain behavior of trachea, bronchus and lung: Secondary | ICD-10-CM

## 2010-12-29 DIAGNOSIS — J939 Pneumothorax, unspecified: Secondary | ICD-10-CM

## 2010-12-29 DIAGNOSIS — Z9889 Other specified postprocedural states: Secondary | ICD-10-CM

## 2010-12-29 NOTE — Progress Notes (Signed)
  HPI:  Mr. Lingard is an 77 year old gentleman who presented with a recurrent left spontaneous pneumothorax. A CT showed a large bulla as well as multiple smaller blebs. He underwent a left VATS resection of the blebs. His perioperative course was palpated by atrial fibrillation. He was seen in consultation by Dr. Aundra Dubin and started on amiodarone. He has an appointment with Dr. Aundra Dubin next week. He states that his breathing is not completely back to normal but denies any significant shortness of breath or wheezing episodes.   Current Outpatient Prescriptions  Medication Sig Dispense Refill  . amiodarone (PACERONE) 200 MG tablet Take 1 tablet (200 mg total) by mouth 2 (two) times daily.  60 tablet  1  . aspirin 81 MG tablet Take 81 mg by mouth daily.        . beta carotene w/minerals (OCUVITE) tablet Take 1 tablet by mouth daily.        . cyclobenzaprine (FLEXERIL) 10 MG tablet TAKE 1 TABLET BY MOUTH 3 TIMES A DAY AS NEEDED  30 tablet  3  . metoprolol tartrate (LOPRESSOR) 25 MG tablet Take 0.5 tablets (12.5 mg total) by mouth 2 (two) times daily.  30 tablet  11  . omeprazole (PRILOSEC) 20 MG capsule Take 20 mg by mouth daily.        . traZODone (DESYREL) 50 MG tablet Take 3 tablets (150 mg total) by mouth at bedtime. For insomnia        Physical Exam BP 105/60  Pulse 55  Resp 20  Ht _0  (1.803 m)  Wt 175 lb (79.379 kg)  BMI 24.41 kg/m2  SpO2 92%  Gen. thin 77 year old male in no acute distress Lungs diminished breath sounds bilaterally but equal, no rales or wheezing Chest tube site with local irritation, sutures removed, remaining incisions healing well  Diagnostic Tests: Chest x-ray shows chronic changes, no pneumothorax  Impression: 77 year old gentleman with COPD recurrent left spontaneous pneumothorax underwent left VATS stapling of blebs. He has had a good surgical result. He has minimal discomfort. His activities at this point are unrestricted, but I did advise him to  build into activities very gradual basis.  He will followup with Dr. [name] regarding his atrial fibrillation next week.  Plan:  Return as needed if further issues with breathing or issues related to his incisions develop.

## 2011-01-05 ENCOUNTER — Encounter: Payer: Self-pay | Admitting: Cardiology

## 2011-01-05 ENCOUNTER — Ambulatory Visit (INDEPENDENT_AMBULATORY_CARE_PROVIDER_SITE_OTHER): Payer: Medicare Other | Admitting: Cardiology

## 2011-01-05 VITALS — BP 110/78 | HR 59 | Ht 71.0 in | Wt 174.0 lb

## 2011-01-05 DIAGNOSIS — I480 Paroxysmal atrial fibrillation: Secondary | ICD-10-CM

## 2011-01-05 DIAGNOSIS — I4891 Unspecified atrial fibrillation: Secondary | ICD-10-CM

## 2011-01-05 LAB — CBC WITH DIFFERENTIAL/PLATELET
Basophils Relative: 0.3 % (ref 0.0–3.0)
Eosinophils Relative: 0.6 % (ref 0.0–5.0)
Lymphocytes Relative: 11 % — ABNORMAL LOW (ref 12.0–46.0)
Monocytes Relative: 8.9 % (ref 3.0–12.0)
Neutrophils Relative %: 79.2 % — ABNORMAL HIGH (ref 43.0–77.0)
RBC: 3.75 Mil/uL — ABNORMAL LOW (ref 4.22–5.81)
WBC: 11.5 10*3/uL — ABNORMAL HIGH (ref 4.5–10.5)

## 2011-01-05 LAB — BASIC METABOLIC PANEL
Calcium: 8.5 mg/dL (ref 8.4–10.5)
Chloride: 107 mEq/L (ref 96–112)
Creatinine, Ser: 1.5 mg/dL (ref 0.4–1.5)

## 2011-01-05 MED ORDER — RIVAROXABAN 20 MG PO TABS: 20.0000 mg | ORAL_TABLET | Freq: Every day | ORAL | Status: DC

## 2011-01-05 NOTE — Assessment & Plan Note (Signed)
Atrial fibrillation has occurred twice now in the setting of pneumothoraces.  It converted both times after he got amiodarone.   He did not have any major abnormalities on echo.  CHADSVASC score is 2 (for age).  As atrial fibrillation has occurred only with significant stressors, and he is not extremely symptomatic with it, I think that he can stop amiodarone.  He will continue metoprolol.  Now that AF has occurred twice in rather quick succession, I think that he ought to be on anticoagulation.  He has no bleeding history.  Hemoglobin was normal today.  His creatinine was a bit higher than prior, with GFR 48.  I will start him on Xarelto (rivaroxaban) renally dosed at 15 mg daily.  He needs to stop aspirin also. Followup in 3 months.

## 2011-01-05 NOTE — Progress Notes (Signed)
PCP: Dr. Damita Dunnings  77 yo with paroxysmal atrial fibrillation presents for cardiology followup.  Patient developed a pneuthorax after a violent sneeze in 8/12.  He required a chest tube.  It was suspected that he ruptured a bleb.  While in the hospital, he developed atrial fibrillation with rapid response that converted after amiodarone was begun.  He had had no prior history of atrial fibrillation.  After discharge, 3 week event monitor showed no further atrial fibrillation.  Echo showed normal LV systolic function and no significant valvular dysfunction.  I initially managed him with aspirin and metoprolol and stopped the amiodarone.   He went back in the hospital in 11/12 with recurrent left PTX.  This time, he had VATS with blebectomy x 3 and pleural stripping/abrasion.  He developed post-operative atrial fibrillation and was again started on amiodarone, eventually converting back to NSR.  He is in NSR today.  No further palpitations.  No chest pain or dyspnea with exertion.  He has been reading the side effects of amiodarone and wonders if he really has to continue it.   Labs (8/12): LDL 126, HDL 33, TSH normal, creatinine 1.2 Labs (11/12): K 3.7, creatinine 0.9 (GFR 78) Labs (12/12): Creatinine 1.5, HCT 40  PMH: 1. Hyperlipidemia: myalgias with Crestor.  2. Atrial fibrillation: 1 episode has been noted in setting of pneumothorax in 8/12.  Converted back to NSR after starting amiodarone.  3 week event monitor afterwards showed no further atrial fibrillation.  Echo (8/12): EF 40-98%, grade 1 diastolic dysfunction, no significant valvular dysfunction.  3. Erectile dysfunction.  4. Left pneumothorax after sneezing in 8/12 due to ruptured bleb.  Recurrent PTX on left in 11/12 with VATS, blebectomy x 3, pleural stripping and abrasion (Dr. Roxan Hockey).  5. COPD: Mild by PFTs in 2012 but has lung blebs.   SH: Lives in [place] with wife.  Quit smoking around 2000 after 50 pack-years.  Retired from  [employer].  No ETOH.   FH: Mother with PCM, father with CVA and ? CAD.  ROS: All systems reviewed and negative except as per HPI.   Current Outpatient Prescriptions  Medication Sig Dispense Refill  . beta carotene w/minerals (OCUVITE) tablet Take 1 tablet by mouth daily.        . cyclobenzaprine (FLEXERIL) 10 MG tablet TAKE 1 TABLET BY MOUTH 3 TIMES A DAY AS NEEDED  30 tablet  3  . metoprolol tartrate (LOPRESSOR) 25 MG tablet Take 0.5 tablets (12.5 mg total) by mouth 2 (two) times daily.  30 tablet  11  . omeprazole (PRILOSEC) 20 MG capsule Take 20 mg by mouth daily.        Marland Kitchen oxyCODONE-acetaminophen (PERCOCET) 5-325 MG per tablet Take 1 tablet by mouth every 4 (four) hours as needed. Take 1-2 tablets       . traZODone (DESYREL) 50 MG tablet Take 3 tablets (150 mg total) by mouth at bedtime. For insomnia      . Rivaroxaban (XARELTO) 20 MG TABS Take 20 mg by mouth daily.  30 tablet  6  . DISCONTD: Rivaroxaban (XARELTO) 20 MG TABS Take 20 mg by mouth daily.  30 tablet  6    BP 110/78  Pulse 59  Ht _0  (1.803 m)  Wt 78.926 kg (174 lb)  BMI 24.27 kg/m2  SpO2 94% General: NAD Neck: No JVD, no thyromegaly or thyroid nodule.  Lungs: Dry crackles left base. CV: Nondisplaced PMI.  Heart regular S1/S2, +S4, no murmur.  No peripheral edema.  No carotid bruit.  Normal pedal pulses.  Abdomen: Soft, nontender, no hepatosplenomegaly, no distention.  Neurologic: Alert and oriented x 3.  Psych: Normal affect. Extremities: No clubbing or cyanosis.

## 2011-01-05 NOTE — Patient Instructions (Addendum)
Stop aspirin.  Stop amiodarone.  Start Xarelto 40m daily.  Your physician recommends that you have  lab work today--BMET/CBC 427.31  Your physician recommends that you schedule a follow-up appointment in: 3 months with Dr MAundra Dubin

## 2011-01-06 ENCOUNTER — Other Ambulatory Visit: Payer: Self-pay | Admitting: *Deleted

## 2011-01-06 ENCOUNTER — Other Ambulatory Visit: Payer: Self-pay

## 2011-01-06 ENCOUNTER — Telehealth: Payer: Self-pay | Admitting: *Deleted

## 2011-01-06 MED ORDER — RIVAROXABAN 15 MG PO TABS: 15.0000 mg | ORAL_TABLET | Freq: Every day | ORAL | Status: DC

## 2011-01-06 NOTE — Telephone Encounter (Signed)
Long, [name] ','<More Detail >>       [name], MD         Sent:  Wed January 05, 2011 9:49 PM                 Message     Needs to decrease Xarelto to 15.    01/06/11--I talked with pt's wife. Pt had already picked up Xarelto 13m daily from pharmacy yesterday. He paid out of pocket $255.  Dr MAundra Dubinwanted pt to take Xarelto 138mdaily instead. I have reviewed with Dr McAundra Dubinnd discussed with pt's wife. Pt will take Xarelto 2075mne-half daily and use his current supply of Xarelto 54m60m have received authorization for Xarelto 15mg83mly from pt's insurance [id number] until 01/06/2012. I have discussed all this with pt's wife and she verbalized understanding.

## 2011-01-06 NOTE — Telephone Encounter (Signed)
Pt's wife is aware that pt will not start Xarelto 35m until pt finishes his current supply of Xarelto 254mone-half daily.

## 2011-02-15 ENCOUNTER — Encounter: Payer: Self-pay | Admitting: Family Medicine

## 2011-02-15 ENCOUNTER — Ambulatory Visit (INDEPENDENT_AMBULATORY_CARE_PROVIDER_SITE_OTHER): Payer: Medicare Other | Admitting: Family Medicine

## 2011-02-15 VITALS — BP 142/70 | HR 56 | Temp 97.8°F | Wt 174.8 lb

## 2011-02-15 DIAGNOSIS — R209 Unspecified disturbances of skin sensation: Secondary | ICD-10-CM

## 2011-02-15 DIAGNOSIS — G629 Polyneuropathy, unspecified: Secondary | ICD-10-CM | POA: Insufficient documentation

## 2011-02-15 DIAGNOSIS — Z79899 Other long term (current) drug therapy: Secondary | ICD-10-CM

## 2011-02-15 DIAGNOSIS — R202 Paresthesia of skin: Secondary | ICD-10-CM

## 2011-02-15 DIAGNOSIS — E739 Lactose intolerance, unspecified: Secondary | ICD-10-CM

## 2011-02-15 DIAGNOSIS — N529 Male erectile dysfunction, unspecified: Secondary | ICD-10-CM

## 2011-02-15 LAB — FOLATE: Folate: 18.8 ng/mL (ref >5.9)

## 2011-02-15 LAB — HEMOGLOBIN A1C: Hgb A1c MFr Bld: 5.7 % (ref 4.6–6.5)

## 2011-02-15 MED ORDER — TADALAFIL 10 MG PO TABS: 10.0000 mg | ORAL_TABLET | Freq: Every day | ORAL | Status: DC | PRN

## 2011-02-15 NOTE — Assessment & Plan Note (Signed)
Increase cialis to 73m prn.

## 2011-02-15 NOTE — Patient Instructions (Addendum)
I think you have worsening venous insufficiency.  Elevate legs as much as able during the day. Blood work to check sugar and vitamin levels. For between toes, use lotrimin (over the counter antifungal). Good to see you today, let us know if not improving, next step would be checking arteries of legs with study. Try higher dose cialis.

## 2011-02-15 NOTE — Progress Notes (Signed)
  Subjective:    Patient ID: [name], male    DOB: [birth year], 78 y.o.   MRN: [id number]  HPI CC: check R foot  Pleasant 78yo with h/o HLD, afib after spontaneous PTX 2012 (COPD, ?emphysema), glucose intolerance, macrocytosis presents with 1 yr h/o right dorsal foot pain described as "low voltage shock" and then last week one night with "electrical shock" sensation just superior to medial malleolus to mid leg that caused difficulty sleeping.  Also with some numbness/tingling sole of feet R>L.  Since this happened has been taking ES tylenol and has not noted anymore problems.  Also noticing R leg falling asleep more easily.  Told had elevated sugar so worried about diabetes.  H/o hammer toe and toenail fungus.  Also noticing trouble with cialis working.  Trouble maintaining erection.  Prescribed previously by Dr. Aundra Dubin at 71m prn.  No smoking.  No EtOH.  Denies claudication sxs, calf pain, or leg weakness, radiculopathy.  Past Medical History  Diagnosis Date  . Hyperlipidemia   . Spontaneous pneumothorax 09/04/2010    [name]  . Erectile dysfunction   . AF (atrial fibrillation)     after PTX 2012   Review of Systems Per HPI    Objective:   Physical Exam  Nursing note and vitals reviewed. Constitutional: He is oriented to person, place, and time. He appears well-developed and well-nourished. No distress.  Cardiovascular:  Pulses:      Dorsalis pedis pulses are 1+ on the right side, and 2+ on the left side.       Posterior tibial pulses are 1+ on the right side, and 2+ on the left side.  Musculoskeletal:       Right ankle: Normal. He exhibits normal range of motion.       Left ankle: Normal. He exhibits normal range of motion.       abnormal inspection - see skin exam Mild maceration R>L between toes No calluses  Hammer toe bilaterally R>L Normal sensation to light touch and monofilament Nails thickened, onychomycosis present  Neurological: He is alert and  oriented to person, place, and time. He has normal strength. No sensory deficit. He exhibits normal muscle tone.  Skin: Skin is warm and dry. No rash noted.       Spider veins R>>>L  Psychiatric: He has a normal mood and affect.       Assessment & Plan:

## 2011-02-15 NOTE — Assessment & Plan Note (Addendum)
R>L paresthesias and electric shock feeling that has now resolved. H/o elevated glucose, check A1c today.  H/o macrocytosis, check B12 and folate today (normal 02/2010).  Denies EtOH use. Diminished pulses (but palpable) on right today - consider ABI if remaining.  No claudication sxs, no evidence of limb ischemia. spider veins on exam today R>>L - rec elevation of leg, consider compression stockings. F/u with PCP. For mild tinea pedis between digits, rec lotrimin.

## 2011-04-14 ENCOUNTER — Other Ambulatory Visit: Payer: Self-pay | Admitting: Family Medicine

## 2011-04-14 ENCOUNTER — Ambulatory Visit (INDEPENDENT_AMBULATORY_CARE_PROVIDER_SITE_OTHER): Payer: Medicare Other | Admitting: Cardiology

## 2011-04-14 ENCOUNTER — Telehealth: Payer: Self-pay | Admitting: *Deleted

## 2011-04-14 ENCOUNTER — Encounter: Payer: Self-pay | Admitting: Cardiology

## 2011-04-14 VITALS — BP 130/65 | HR 60 | Ht 71.0 in | Wt 177.0 lb

## 2011-04-14 DIAGNOSIS — I779 Disorder of arteries and arterioles, unspecified: Secondary | ICD-10-CM

## 2011-04-14 DIAGNOSIS — J939 Pneumothorax, unspecified: Secondary | ICD-10-CM

## 2011-04-14 DIAGNOSIS — I4891 Unspecified atrial fibrillation: Secondary | ICD-10-CM

## 2011-04-14 DIAGNOSIS — D649 Anemia, unspecified: Secondary | ICD-10-CM

## 2011-04-14 DIAGNOSIS — I480 Paroxysmal atrial fibrillation: Secondary | ICD-10-CM

## 2011-04-14 DIAGNOSIS — J9383 Other pneumothorax: Secondary | ICD-10-CM

## 2011-04-14 DIAGNOSIS — E7800 Pure hypercholesterolemia, unspecified: Secondary | ICD-10-CM

## 2011-04-14 DIAGNOSIS — I739 Peripheral vascular disease, unspecified: Secondary | ICD-10-CM

## 2011-04-14 DIAGNOSIS — R0989 Other specified symptoms and signs involving the circulatory and respiratory systems: Secondary | ICD-10-CM

## 2011-04-14 MED ORDER — FLUTICASONE PROPIONATE 50 MCG/ACT NA SUSP: 2.0000 | Freq: Every day | NASAL | Status: DC

## 2011-04-14 NOTE — Telephone Encounter (Signed)
Error

## 2011-04-14 NOTE — Patient Instructions (Addendum)
Your physician has requested that you have a lower  extremity arterial duplex. This test is an ultrasound of the arteries in the legs . It looks at arterial blood flow in the legs . Allow one hour for Lower  Arterial scans. There are no restrictions or special instructions  Your physician recommends that you return for lab work when you have the arterial duplex done--BMET/BNP  Your physician wants you to follow-up in: 6 months with Dr Aundra Dubin. Thane Edu 2013).  You will receive a reminder letter in the mail two months in advance. If you don't receive a letter, please call our office to schedule the follow-up appointment.

## 2011-04-15 ENCOUNTER — Encounter: Payer: Self-pay | Admitting: Cardiology

## 2011-04-15 DIAGNOSIS — I739 Peripheral vascular disease, unspecified: Secondary | ICD-10-CM | POA: Insufficient documentation

## 2011-04-15 NOTE — Progress Notes (Signed)
PCP: Dr. Damita Dunnings  78 yo with paroxysmal atrial fibrillation presents for cardiology followup.  Patient developed a pneuthorax after a violent sneeze in 8/12.  He required a chest tube.  It was suspected that he ruptured a bleb.  While in the hospital, he developed atrial fibrillation with rapid response that converted after amiodarone was begun.  He had had no prior history of atrial fibrillation.  After discharge, 3 week event monitor showed no further atrial fibrillation.  Echo showed normal LV systolic function and no significant valvular dysfunction.  I initially managed him with aspirin and metoprolol and stopped the amiodarone.   He went back in the hospital in 11/12 with recurrent left PTX.  This time, he had VATS with blebectomy x 3 and pleural stripping/abrasion.  He developed post-operative atrial fibrillation and was again started on amiodarone, eventually converting back to NSR.  He is in NSR today.  No further palpitations.  No chest pain.  Since his hospitalizations with pneumothoraces, he has had some dyspnea when walking up a hill or steps.  No problems walking on flat ground.  He also reports erectile dysfunction that has not responded to Cialis.    Labs (8/12): LDL 126, HDL 33, TSH normal, creatinine 1.2 Labs (11/12): K 3.7, creatinine 0.9 (GFR 78) Labs (12/12): Creatinine 1.5, HCT 40  PMH: 1. Hyperlipidemia: myalgias with Crestor.  2. Atrial fibrillation: 1 episode has been noted in setting of pneumothorax in 8/12.  Converted back to NSR after starting amiodarone.  3 week event monitor afterwards showed no further atrial fibrillation.  Echo (8/12): EF 24-26%, grade 1 diastolic dysfunction, no significant valvular dysfunction.  3. Erectile dysfunction.  4. Left pneumothorax after sneezing in 8/12 due to ruptured bleb.  Recurrent PTX on left in 11/12 with VATS, blebectomy x 3, pleural stripping and abrasion (Dr. Roxan Hockey).  5. COPD: Mild by PFTs in 2012 but has lung blebs.   SH:  Lives in [place] with wife.  Quit smoking around 2000 after 50 pack-years.  Retired from [employer].  No ETOH.   FH: Mother with PCM, father with CVA and ? CAD.  ROS: All systems reviewed and negative except as per HPI.   Current Outpatient Prescriptions  Medication Sig Dispense Refill  . beta carotene w/minerals (OCUVITE) tablet Take 1 tablet by mouth daily.        . cyclobenzaprine (FLEXERIL) 10 MG tablet TAKE 1 TABLET BY MOUTH 3 TIMES A DAY AS NEEDED  30 tablet  3  . metoprolol tartrate (LOPRESSOR) 25 MG tablet Take 0.5 tablets (12.5 mg total) by mouth 2 (two) times daily.  30 tablet  11  . omeprazole (PRILOSEC) 20 MG capsule Take 20 mg by mouth daily.        . Rivaroxaban (XARELTO) 15 MG TABS tablet Take 1 tablet (15 mg total) by mouth daily.  30 tablet  6  . traZODone (DESYREL) 50 MG tablet Take 3 tablets (150 mg total) by mouth at bedtime. For insomnia      . fluticasone (FLONASE) 50 MCG/ACT nasal spray Place 2 sprays into the nose daily.  1 g  0    BP 130/65  Pulse 60  Ht _0  (1.803 m)  Wt 177 lb (80.287 kg)  BMI 24.69 kg/m2 General: NAD Neck: No JVD, no thyromegaly or thyroid nodule.  Lungs: Dry crackles left base. CV: Nondisplaced PMI.  Heart regular S1/S2, +S4, no murmur.  No peripheral edema.  No carotid bruit.  Feet are warm but I am  unable to palpate pedal pulses.   Abdomen: Soft, nontender, no hepatosplenomegaly, no distention.  Neurologic: Alert and oriented x 3.  Psych: Normal affect. Extremities: No clubbing or cyanosis.

## 2011-04-15 NOTE — Assessment & Plan Note (Signed)
Atrial fibrillation has occurred twice now in the setting of pneumothoraces.  It converted both times after he got amiodarone.   He did not have any major abnormalities on echo.  CHADSVASC score is 2 (for age).  As atrial fibrillation has occurred only with significant stressors, and he is not extremely symptomatic with it, I had him stop amiodarone.  He will continue metoprolol.  I have him on Xarelto renally dosed at 15 mg daily. I will check a BMET today for renal function.

## 2011-04-15 NOTE — Assessment & Plan Note (Signed)
Erectile dysfunction + nonpalpable pedal pulses.  I am concerned for significant vascular disease.  He does deny claudication.  If he has significant PAD, he should at least be on a statin.  I will get peripheral arterial dopplers to assess.

## 2011-04-15 NOTE — Assessment & Plan Note (Signed)
Mr Lemere has mild exertional dyspnea that may be a sequelae of his two pneumothoraces.  He is not volume overloaded on exam and EF was normal on echo.  I will get a BNP with his labs today.

## 2011-04-18 ENCOUNTER — Other Ambulatory Visit (INDEPENDENT_AMBULATORY_CARE_PROVIDER_SITE_OTHER): Payer: Medicare Other

## 2011-04-18 DIAGNOSIS — R0602 Shortness of breath: Secondary | ICD-10-CM

## 2011-04-18 DIAGNOSIS — I4891 Unspecified atrial fibrillation: Secondary | ICD-10-CM

## 2011-04-18 DIAGNOSIS — D649 Anemia, unspecified: Secondary | ICD-10-CM

## 2011-04-18 DIAGNOSIS — E7800 Pure hypercholesterolemia, unspecified: Secondary | ICD-10-CM

## 2011-04-18 LAB — CBC WITH DIFFERENTIAL/PLATELET
Basophils Relative: 0.1 % (ref 0.0–3.0)
Eosinophils Relative: 1.9 % (ref 0.0–5.0)
MCV: 104.6 fl — ABNORMAL HIGH (ref 78.0–100.0)
Monocytes Absolute: 0.9 10*3/uL (ref 0.1–1.0)
Monocytes Relative: 11.6 % (ref 3.0–12.0)
Neutrophils Relative %: 64.3 % (ref 43.0–77.0)
RBC: 4.09 Mil/uL — ABNORMAL LOW (ref 4.22–5.81)
WBC: 8 10*3/uL (ref 4.5–10.5)

## 2011-04-18 LAB — BASIC METABOLIC PANEL
BUN: 23 mg/dL (ref 6–23)
CO2: 26 mEq/L (ref 19–32)
Chloride: 109 mEq/L (ref 96–112)
GFR: 56.8 mL/min — ABNORMAL LOW (ref >60.00)
Glucose, Bld: 101 mg/dL — ABNORMAL HIGH (ref 70–99)
Potassium: 4.1 mEq/L (ref 3.5–5.1)

## 2011-04-18 LAB — COMPREHENSIVE METABOLIC PANEL
Albumin: 4.1 g/dL (ref 3.5–5.2)
Alkaline Phosphatase: 58 U/L (ref 39–117)
BUN: 23 mg/dL (ref 6–23)
GFR: 56.8 mL/min — ABNORMAL LOW (ref >60.00)
Glucose, Bld: 101 mg/dL — ABNORMAL HIGH (ref 70–99)
Potassium: 4.1 mEq/L (ref 3.5–5.1)
Total Bilirubin: 0.4 mg/dL (ref 0.3–1.2)

## 2011-04-18 LAB — LIPID PANEL
HDL: 35.3 mg/dL — ABNORMAL LOW (ref >39.00)
Triglycerides: 196 mg/dL — ABNORMAL HIGH (ref 0.0–149.0)

## 2011-04-25 ENCOUNTER — Ambulatory Visit (INDEPENDENT_AMBULATORY_CARE_PROVIDER_SITE_OTHER): Payer: Medicare Other | Admitting: Family Medicine

## 2011-04-25 ENCOUNTER — Encounter: Payer: Self-pay | Admitting: Family Medicine

## 2011-04-25 VITALS — BP 114/64 | HR 55 | Temp 97.4°F | Wt 179.0 lb

## 2011-04-25 DIAGNOSIS — Z0000 Encounter for general adult medical examination without abnormal findings: Secondary | ICD-10-CM

## 2011-04-25 DIAGNOSIS — I4891 Unspecified atrial fibrillation: Secondary | ICD-10-CM

## 2011-04-25 DIAGNOSIS — D539 Nutritional anemia, unspecified: Secondary | ICD-10-CM

## 2011-04-25 DIAGNOSIS — I739 Peripheral vascular disease, unspecified: Secondary | ICD-10-CM

## 2011-04-25 DIAGNOSIS — G4700 Insomnia, unspecified: Secondary | ICD-10-CM | POA: Insufficient documentation

## 2011-04-25 DIAGNOSIS — N529 Male erectile dysfunction, unspecified: Secondary | ICD-10-CM

## 2011-04-25 DIAGNOSIS — I480 Paroxysmal atrial fibrillation: Secondary | ICD-10-CM

## 2011-04-25 DIAGNOSIS — F17200 Nicotine dependence, unspecified, uncomplicated: Secondary | ICD-10-CM

## 2011-04-25 DIAGNOSIS — I779 Disorder of arteries and arterioles, unspecified: Secondary | ICD-10-CM

## 2011-04-25 MED ORDER — TRAZODONE HCL 150 MG PO TABS: 150.0000 mg | ORAL_TABLET | Freq: Every day | ORAL | Status: DC

## 2011-04-25 NOTE — Assessment & Plan Note (Signed)
Dopplers pending.

## 2011-04-25 NOTE — Assessment & Plan Note (Signed)
Controlled, continue current meds.

## 2011-04-25 NOTE — Assessment & Plan Note (Signed)
H/o elevated MCV. This continues. Not anemia, other cell lines are fine. Not drinking etoh.

## 2011-04-25 NOTE — Assessment & Plan Note (Signed)
rrr today, not tachy, continue BB

## 2011-04-25 NOTE — Assessment & Plan Note (Signed)
Cont current meds for now, will follow clinically .

## 2011-04-25 NOTE — Assessment & Plan Note (Signed)
Has stopped smoking  

## 2011-04-25 NOTE — Progress Notes (Signed)
I have personally reviewed the Medicare Annual Wellness questionnaire and have noted 1. The patient's medical and social history 2. Their use of alcohol, tobacco or illicit drugs 3. Their current medications and supplements 4. The patient's functional ability including ADL's, fall risks, home safety risks and hearing or visual             impairment. 5. Diet and physical activities 6. Evidence for depression or mood disorders  The patients weight, height, BMI have been recorded in the chart and visual acuity is per eye clinic with the [place].  I have made referrals, counseling and provided education to the patient based review of the above and I have provided the pt with a written personalized care plan for preventive services.  colonoscopy divertics, int hemms (Dr Fuller Plan) 03/10/2004  PSA not indicated Vaccines up to date.   Labs d/w pt.   H/o PTX, not sob and no CP now.  H/o AF with the episodes, now resolved.    Likely PAD with ED and dopplers are pending.  Lipids are elevated but he was prev intolerant of crestor.  We talked about options, esp diet.    Insomnia.  Controlled with current meds.  No ADE, but good effect. Needs refill.   H/o elevated MCV.  This continues.  Not anemia, other cell lines are fine.  Not drinking etoh.   PMH and SH reviewed  Meds, vitals, and allergies reviewed.   ROS: See HPI.  Otherwise negative.    GEN: nad, alert and oriented HEENT: mucous membranes moist NECK: supple w/o LA CV: rrr. PULM: ctab, no inc wob ABD: soft, +bs EXT: no edema SKIN: no acute rash

## 2011-04-25 NOTE — Patient Instructions (Signed)
Don't change your meds for now.  Keep the appointment on the 11th about your legs.  Take the trazodone for sleep and call with questions.

## 2011-04-28 ENCOUNTER — Other Ambulatory Visit: Payer: Medicare Other

## 2011-04-28 ENCOUNTER — Encounter (INDEPENDENT_AMBULATORY_CARE_PROVIDER_SITE_OTHER): Payer: Medicare Other

## 2011-04-28 DIAGNOSIS — R0989 Other specified symptoms and signs involving the circulatory and respiratory systems: Secondary | ICD-10-CM

## 2011-04-28 DIAGNOSIS — I739 Peripheral vascular disease, unspecified: Secondary | ICD-10-CM

## 2011-06-30 ENCOUNTER — Other Ambulatory Visit: Payer: Self-pay | Admitting: Cardiology

## 2011-07-14 ENCOUNTER — Encounter: Payer: Self-pay | Admitting: Family Medicine

## 2011-07-14 ENCOUNTER — Ambulatory Visit (INDEPENDENT_AMBULATORY_CARE_PROVIDER_SITE_OTHER): Payer: Medicare Other | Admitting: Family Medicine

## 2011-07-14 VITALS — BP 122/64 | HR 80 | Temp 97.7°F | Wt 173.0 lb

## 2011-07-14 DIAGNOSIS — M549 Dorsalgia, unspecified: Secondary | ICD-10-CM | POA: Insufficient documentation

## 2011-07-14 DIAGNOSIS — R350 Frequency of micturition: Secondary | ICD-10-CM | POA: Insufficient documentation

## 2011-07-14 DIAGNOSIS — R300 Dysuria: Secondary | ICD-10-CM

## 2011-07-14 LAB — POCT URINALYSIS DIPSTICK
Bilirubin, UA: NEGATIVE
Glucose, UA: NEGATIVE
Ketones, UA: NEGATIVE
Leukocytes, UA: NEGATIVE
Nitrite, UA: NEGATIVE

## 2011-07-14 NOTE — Progress Notes (Signed)
Intermittent changes in urination, irregular onset- not daily.  No burning with urination, but feels like he can't fully void (intermittently) and will have to urinate more frequently.  Going on for 5-6 weeks, but not escalating recently.    Also with intermittent dull R lower back ache that may not be related to the above.  Hasn't been playing golf as much recently, has been less active.  Same duration, several weeks, as above.  No FCNAV.  Tylenol helps some.  When walking more, he tends to do well.   No chest pain.    Meds, vitals, and allergies reviewed.   ROS: See HPI.  Otherwise, noncontributory.  nad ncat rrr ctab Benign back exam- no focal tenderness, rash.  Normal ROM in the back and no midline pain DRE wnl, no enlargement. Prostate not ttp

## 2011-07-14 NOTE — Assessment & Plan Note (Signed)
U/a likely unremarkable.  Sx aren't troubling enough to start meds at this point.  Would check ucx.  Treat is positive.

## 2011-07-14 NOTE — Assessment & Plan Note (Signed)
Benign exam, he may be more stiff with dec in activity.  Inc activity as f/u prn.

## 2011-07-14 NOTE — Patient Instructions (Addendum)
Keep using a heating pad and tylenol for your back. If you have more trouble urinating, let me know.  Take care.

## 2011-07-16 ENCOUNTER — Other Ambulatory Visit: Payer: Self-pay | Admitting: Cardiology

## 2011-07-16 LAB — URINE CULTURE
Colony Count: NO GROWTH
Organism ID, Bacteria: NO GROWTH

## 2011-07-17 ENCOUNTER — Other Ambulatory Visit: Payer: Self-pay | Admitting: Family Medicine

## 2011-07-17 DIAGNOSIS — R300 Dysuria: Secondary | ICD-10-CM

## 2011-07-18 ENCOUNTER — Other Ambulatory Visit: Payer: Self-pay | Admitting: Family Medicine

## 2011-08-08 ENCOUNTER — Other Ambulatory Visit: Payer: Self-pay | Admitting: Cardiology

## 2011-10-03 ENCOUNTER — Telehealth: Payer: Self-pay

## 2011-10-03 NOTE — Telephone Encounter (Signed)
Patient says he was doing better by nightfall that evening.  He is doing fine now, played golf today.

## 2011-10-03 NOTE — Telephone Encounter (Signed)
Please call pt and get update. Thanks.

## 2011-10-03 NOTE — Telephone Encounter (Signed)
Noted. Thanks.

## 2011-10-03 NOTE — Telephone Encounter (Signed)
Triage Record Num: [id number] Operator: Hughes Better Patient Name: [facility] Call Date & Time: 10/02/2011 3:25:24PM Patient Phone: [phone] PCP: [name] Patient Gender: Male PCP Fax : Patient DOB: [birth year] Practice Name: [facility] Reason for Call: Caller: Geoffrey/Patient; PCP: [name] Brigitte Pulse) [facility]); CB#: [phone]; call regarding abdominal pain; Onset: this morning (10/02/2011): below belt on right side. Patient ate "too much" at church supper the evening of 10/01/2011. Patient took laxative and gas pill today (10/02/2011) at 13:00. Last bowel movement: 09/29/2011. Patient reports increasing abdominal discomfort. Afebrile. Patient is "not passing gas". Patient has had dental work. RN reviewed abdominal pain care advice with patient. Patient advised to call back anytime. Patient advised to call PCP in the morning if abdominal pain has not resolved. Protocol(s) Used: Abdominal Pain Recommended Outcome per Protocol: See Provider within 24 hours Reason for Outcome: New onset constant mild or aching lower abdominal pain Care Advice: ~ Soak in warm bath or place heating pad set on low on abdomen to aid in pain relief. Lying down with legs elevated supported by pillow under knees or lying on side with knees pulled up to chest may help provide relief during more severe cramping. ~ ~ Pain medication or laxatives should not be taken until symptoms are evaluated. Call provider immediately if pain gets worse, localized to one spot or lasts more than 4 hours and prevents normal activity; tell provider if vomiting begins after onset of pain or if having any fever. ~ 09/

## 2011-10-10 ENCOUNTER — Telehealth: Payer: Self-pay | Admitting: Family Medicine

## 2011-10-10 NOTE — Telephone Encounter (Signed)
Will see pt tomorrow.

## 2011-10-10 NOTE — Telephone Encounter (Signed)
Caller: Eddrick/Patient; Patient Name: [name]; PCP: ; Shawnie Dapper Phone Number: [phone];  Had "gas attack" 9-15. Pain was down "below belt" on right side. Area felt "hard" but same area on left side felt soft. Has had same pain 9-23. When first occurred, took laxaative and helped. Has had normal stools since initial event. Has also taken additional laxative 9-23. Pain is constant but is "not overbearing". Pain is not as bad as when initially started. Afebrile. No vomiting. Has appointment 9-24 at 1100 that was scheduled prior to nurse call.

## 2011-10-11 ENCOUNTER — Ambulatory Visit (INDEPENDENT_AMBULATORY_CARE_PROVIDER_SITE_OTHER): Payer: Medicare Other | Admitting: Family Medicine

## 2011-10-11 ENCOUNTER — Encounter: Payer: Self-pay | Admitting: Family Medicine

## 2011-10-11 VITALS — BP 106/52 | HR 68 | Temp 97.6°F | Wt 169.0 lb

## 2011-10-11 DIAGNOSIS — K5900 Constipation, unspecified: Secondary | ICD-10-CM

## 2011-10-11 NOTE — Progress Notes (Signed)
9 days ago he started feeling bloated in the middle of the day with some RLQ tenderness.  He decided to take a laxative and after effect of laxative he felt better.  He had similar sensation yesterday, RLQ pain, resolved now.  He took a miralax yesterday.  No abd pain now.  No FCNAVD, no blood in stool.  Still has his appendix.  Asking about options.    Meds, vitals, and allergies reviewed.   ROS: See HPI.  Otherwise, noncontributory.  nad ncat Neck supple, no LA rrr ctab abd soft, not ttp, normal BS, no rebound Ext w/o edema Skin w/o rash

## 2011-10-11 NOTE — Patient Instructions (Signed)
I would take miralax once or twice a week to try to keep this from happening.  I don't feel a hernia.  If this continues, then notify me. Take care.

## 2011-10-12 DIAGNOSIS — K5900 Constipation, unspecified: Secondary | ICD-10-CM | POA: Insufficient documentation

## 2011-10-12 NOTE — Assessment & Plan Note (Signed)
Likely cause, sx resolved now.  Would use miralax 1-2 times a week to prevent return.  F/u prn.  Reassuring exam and history.  He agrees. No blood in stool per patient.

## 2011-11-04 ENCOUNTER — Other Ambulatory Visit: Payer: Self-pay | Admitting: Cardiology

## 2012-02-02 ENCOUNTER — Ambulatory Visit (INDEPENDENT_AMBULATORY_CARE_PROVIDER_SITE_OTHER): Payer: Medicare Other | Admitting: Family Medicine

## 2012-02-02 ENCOUNTER — Encounter: Payer: Self-pay | Admitting: Family Medicine

## 2012-02-02 VITALS — BP 122/60 | HR 54 | Temp 97.4°F | Wt 167.0 lb

## 2012-02-02 DIAGNOSIS — M549 Dorsalgia, unspecified: Secondary | ICD-10-CM

## 2012-02-02 DIAGNOSIS — R350 Frequency of micturition: Secondary | ICD-10-CM

## 2012-02-02 LAB — URINALYSIS, ROUTINE W REFLEX MICROSCOPIC
Hgb urine dipstick: NEGATIVE
Specific Gravity, Urine: 1.01 (ref 1.000–1.030)
Total Protein, Urine: NEGATIVE
Urine Glucose: NEGATIVE

## 2012-02-02 MED ORDER — TAMSULOSIN HCL 0.4 MG PO CAPS: 0.4000 mg | ORAL_CAPSULE | Freq: Every day | ORAL | Status: DC

## 2012-02-02 NOTE — Progress Notes (Signed)
Prev seen for similar in 6/13.   About 5 weeks ago he got up early AM to go to [place].  He got tangled on the bathroom mat and fell onto his buttocks.  He had some soreness on the R buttock and this got better after a few weeks.  He had some concurrent B lower back pain that is improving in the meantime.  Currently w/o pain.  Would prev note lower back pain when rolling over in the bed at night.  After he would get up and move around, it would loosen up and get better.  Less exercise overall in the last 6 months, less golf.  He's been cutting wood for his wood stove.    He has urinary frequency w/o urgency.  No burning with urination.  He sometimes has delay in stream onset. He had smoked for 50 years prev. Quit about 15 years ago.  No blood seen in urine.  No FH prostate cancer. He had improved some from the last OV in 6/13 w/o full resolution.  He'll have a few days with more symptoms then a few days with less symptoms.  Prev with DRE wnl, no enlargement, prostate not ttp.  No abd pain.    No CP.  His SOB isn't changed recently.  It it as at baseline since the PTX prev.    Meds, vitals, and allergies reviewed.   ROS: See HPI.  Otherwise, noncontributory.  nad ncat Mmm rrr ctab abd soft, not ttp Back w/o midline or paraspinal muscle tenderness Ext w/o edema

## 2012-02-02 NOTE — Patient Instructions (Addendum)
Go to the lab on the way out.  We'll contact you with your lab report.  Start the flomax and notify me at the end of the month if it is helping or not.

## 2012-02-03 NOTE — Assessment & Plan Note (Signed)
U/a neg, ucx pending. Add of flomax and he'll report back as needed.  He agrees.

## 2012-02-03 NOTE — Assessment & Plan Note (Signed)
Resolved, was likely mechanical/MSK and due to strain/fall.  F/u prn.

## 2012-02-14 ENCOUNTER — Emergency Department (HOSPITAL_COMMUNITY)
Admission: EM | Admit: 2012-02-14 | Discharge: 2012-02-15 | Disposition: A | Discharge status: Home / Self Care | Payer: Medicare Other | Attending: Emergency Medicine | Admitting: Emergency Medicine

## 2012-02-14 ENCOUNTER — Encounter (HOSPITAL_COMMUNITY): Payer: Self-pay | Admitting: *Deleted

## 2012-02-14 ENCOUNTER — Emergency Department (HOSPITAL_COMMUNITY): Payer: Medicare Other

## 2012-02-14 DIAGNOSIS — Z87891 Personal history of nicotine dependence: Secondary | ICD-10-CM | POA: Insufficient documentation

## 2012-02-14 DIAGNOSIS — E785 Hyperlipidemia, unspecified: Secondary | ICD-10-CM | POA: Insufficient documentation

## 2012-02-14 DIAGNOSIS — IMO0002 Reserved for concepts with insufficient information to code with codable children: Secondary | ICD-10-CM | POA: Insufficient documentation

## 2012-02-14 DIAGNOSIS — M25519 Pain in unspecified shoulder: Secondary | ICD-10-CM

## 2012-02-14 DIAGNOSIS — W010XXA Fall on same level from slipping, tripping and stumbling without subsequent striking against object, initial encounter: Secondary | ICD-10-CM | POA: Insufficient documentation

## 2012-02-14 DIAGNOSIS — Z8709 Personal history of other diseases of the respiratory system: Secondary | ICD-10-CM | POA: Insufficient documentation

## 2012-02-14 DIAGNOSIS — Y9389 Activity, other specified: Secondary | ICD-10-CM | POA: Insufficient documentation

## 2012-02-14 DIAGNOSIS — S7010XA Contusion of unspecified thigh, initial encounter: Secondary | ICD-10-CM | POA: Insufficient documentation

## 2012-02-14 DIAGNOSIS — Y9289 Other specified places as the place of occurrence of the external cause: Secondary | ICD-10-CM | POA: Insufficient documentation

## 2012-02-14 DIAGNOSIS — I2510 Atherosclerotic heart disease of native coronary artery without angina pectoris: Secondary | ICD-10-CM | POA: Insufficient documentation

## 2012-02-14 DIAGNOSIS — Z79899 Other long term (current) drug therapy: Secondary | ICD-10-CM | POA: Insufficient documentation

## 2012-02-14 DIAGNOSIS — Z7901 Long term (current) use of anticoagulants: Secondary | ICD-10-CM | POA: Insufficient documentation

## 2012-02-14 DIAGNOSIS — W009XXA Unspecified fall due to ice and snow, initial encounter: Secondary | ICD-10-CM

## 2012-02-14 DIAGNOSIS — Z8679 Personal history of other diseases of the circulatory system: Secondary | ICD-10-CM | POA: Insufficient documentation

## 2012-02-14 DIAGNOSIS — Z87448 Personal history of other diseases of urinary system: Secondary | ICD-10-CM | POA: Insufficient documentation

## 2012-02-14 DIAGNOSIS — S7000XA Contusion of unspecified hip, initial encounter: Secondary | ICD-10-CM

## 2012-02-14 HISTORY — DX: Atherosclerotic heart disease of native coronary artery without angina pectoris: I25.10

## 2012-02-14 MED ORDER — HYDROCODONE-ACETAMINOPHEN 5-325 MG PO TABS
1.0000 | ORAL_TABLET | Freq: Once | ORAL | Status: AC
  Administered 2012-02-14: 1 via ORAL
  Filled 2012-02-14: qty 1

## 2012-02-14 MED ORDER — HYDROCODONE-ACETAMINOPHEN 5-325 MG PO TABS
1.0000 | ORAL_TABLET | ORAL | Status: AC
  Administered 2012-02-14: 1 via ORAL
  Filled 2012-02-14: qty 1

## 2012-02-14 MED ORDER — HYDROCODONE-ACETAMINOPHEN 5-325 MG PO TABS: 1.0000 | ORAL_TABLET | Freq: Four times a day (QID) | ORAL | Status: DC | PRN

## 2012-02-14 NOTE — ED Notes (Signed)
The pt fell outside on the ice at 1530 today.  He is c/o rt hip and rt shoulder since then and the pain is increasing

## 2012-02-14 NOTE — ED Provider Notes (Signed)
History     CSN: [id number]  Arrival date & time 02/14/12  2233   First MD Initiated Contact with Patient 02/14/12 2250      Chief Complaint  Patient presents with  . Fall    (Consider location/radiation/quality/duration/timing/severity/associated sxs/prior treatment) HPI 79 year old male presents emergency apartment complaining of right hip and right buttock pain after fall today. Patient reports he had a mechanical fall, was outside feeding the birds and had on poor footwear when he slipped and fell on the ice. Injury occurred around 330 pm.  Patient reports he took Tylenol which helped his shoulder pains. He reports he is having worsening soft tissue swelling in his right buttock/hip area, and this has gotten more painful with time. Patient has history of atrial fibrillation, is on xarelto. He denies any shortness of breath, he denies striking his head, no head neck or back pain. No difficulties walking.  Past Medical History  Diagnosis Date  . Hyperlipidemia   . Spontaneous pneumothorax 09/04/2010    [name]  . Erectile dysfunction   . AF (atrial fibrillation)     after PTX 2012  . Coronary artery disease     Past Surgical History  Procedure Date  . Chest tube placement 09/04/2010  . Video assisted thoracoscopy 12/15/2010    Procedure: VIDEO ASSISTED THORACOSCOPY;  Surgeon: Melrose Nakayama, MD;  Location: [place];  Service: Thoracic;  Laterality: Left;  blebectomy with pleural abrasion    Family History  Problem Relation Age of Onset  . Diabetes Mother   . Colon cancer Neg Hx   . Prostate cancer Neg Hx     History  Substance Use Topics  . Smoking status: Former Smoker    Quit date: 04/14/1999  . Smokeless tobacco: Not on file  . Alcohol Use: No     Comment: h/o use      Review of Systems  See History of Present Illness; otherwise all other systems are reviewed and negative Allergies  Blistex and Rosuvastatin  Home Medications   Current  Outpatient Rx  Name  Route  Sig  Dispense  Refill  . ACETAMINOPHEN 325 MG PO TABS   Oral   Take 650 mg by mouth every 6 (six) hours as needed. For pain         . OCUVITE PO TABS   Oral   Take 1 tablet by mouth daily.           . CYCLOBENZAPRINE HCL 10 MG PO TABS   Oral   Take 10 mg by mouth daily as needed. For muscle spasms         . FLUTICASONE PROPIONATE 50 MCG/ACT NA SUSP      PLACE 2 SPRAYS INTO THE NOSE DAILY.   16 g   3   . METOPROLOL TARTRATE 25 MG PO TABS      TAKE 1/2 TABLETS BY MOUTH TWICE A DAY   30 tablet   11   . OMEPRAZOLE 20 MG PO CPDR   Oral   Take 20 mg by mouth daily.           Marland Kitchen RIVAROXABAN 15 MG PO TABS               . TADALAFIL 10 MG PO TABS   Oral   Take 10 mg by mouth daily as needed.         Marland Kitchen TAMSULOSIN HCL 0.4 MG PO CAPS   Oral   Take 1 capsule (0.4 mg total)  by mouth daily.   30 capsule   12   . TRAZODONE HCL 150 MG PO TABS   Oral   Take 1 tablet (150 mg total) by mouth at bedtime. For insomnia   90 tablet   3   . VALACYCLOVIR HCL 500 MG PO TABS   Oral   Take 500 mg by mouth 2 (two) times daily as needed. For flair ups           BP 162/106  Pulse 79  Temp 97.7 F (36.5 C) (Oral)  Resp 22  SpO2 96%  Physical Exam  Nursing note and vitals reviewed. Constitutional: He is oriented to person, place, and time. He appears well-developed and well-nourished. No distress.  HENT:  Head: Normocephalic and atraumatic.  Nose: Nose normal.  Mouth/Throat: Oropharynx is clear and moist.  Eyes: Conjunctivae normal and EOM are normal. Pupils are equal, round, and reactive to light.  Neck: Normal range of motion. Neck supple. No JVD present. No tracheal deviation present. No thyromegaly present.  Cardiovascular: Normal rate, regular rhythm, normal heart sounds and intact distal pulses.  Exam reveals no gallop and no friction rub.   No murmur heard. Pulmonary/Chest: Effort normal and breath sounds normal. No stridor. No  respiratory distress. He has no wheezes. He has no rales. He exhibits no tenderness.  Abdominal: Soft. Bowel sounds are normal. He exhibits no distension and no mass. There is no tenderness. There is no rebound and no guarding.  Musculoskeletal: Normal range of motion. He exhibits no edema and no tenderness.       Normal range of motion of right shoulder, some crepitus noted with movement of the shoulder, patient reports this is his norm. No effusion no deformity no overlying bruising. He is distally neurovascularly intact.  Pain with palpation of right buttock area. No significant crepitus, no overlying bruising, no skin changes. Patient with minimal pain with abduction and abduction of the right hip. There is no shortening or internal rotation noted  Lymphadenopathy:    He has no cervical adenopathy.  Neurological: He is alert and oriented to person, place, and time. He displays normal reflexes. He exhibits normal muscle tone. Coordination normal.  Skin: Skin is warm and dry. No rash noted. No erythema. No pallor.  Psychiatric: He has a normal mood and affect. His behavior is normal. Judgment and thought content normal.    ED Course  Procedures (including critical care time)  Labs Reviewed - No data to display Dg Shoulder Right  02/14/2012  *RADIOLOGY REPORT*  Clinical Data: 79 year old male with right shoulder pain following fall.  RIGHT SHOULDER - 2+ VIEW  Comparison: None  Findings: There is no evidence of fracture, subluxation or dislocation. No focal bony lesions are present. A probable chronic rotator cuff tear is present. The visualized right bony thorax is unremarkable.  IMPRESSION: No evidence of acute bony abnormality.  Probable chronic rotator cuff tear.   Original Report Authenticated By: Margarette Canada, M.D.    Dg Hip Complete Right  02/14/2012  *RADIOLOGY REPORT*  Clinical Data: 79 year old male with right hip pain following fall.  RIGHT HIP - COMPLETE 2+ VIEW  Comparison: None   Findings: No evidence of acute fracture, subluxation or dislocation identified.  No radio-opaque foreign bodies are present.  No focal bony lesions are noted.  The joint spaces are unremarkable.  IMPRESSION: No evidence of acute bony abnormality.   Original Report Authenticated By: Margarette Canada, M.D.      1. Fall from slipping  on ice   2. Shoulder pain   3. Contusion of hip and thigh       MDM  79 year old male status post fall this afternoon, on xarelto. We'll check x-rays of shoulder and hip, but based on exam, feel these are contusions and not bony injuries. Patient is at slightly higher risk for increased swelling secondary to his blood thinning medication. Will treat for pain        Kalman Drape, MD 02/14/12 2349

## 2012-02-14 NOTE — ED Notes (Signed)
Pt states that he was feeding the birds and foot slipped out from under him and he fell; pt denies nausea; pt c/o pain in shoulder and right hip. Pt alert and mentating appropriately; pt denies hitting head and denies LOC.

## 2012-02-14 NOTE — ED Notes (Signed)
Dr Sharol Given at bedside

## 2012-02-15 NOTE — ED Notes (Signed)
Pt discharged.Vital signs  Stable and GCS 15

## 2012-02-17 ENCOUNTER — Encounter: Payer: Self-pay | Admitting: Family Medicine

## 2012-02-17 ENCOUNTER — Ambulatory Visit (INDEPENDENT_AMBULATORY_CARE_PROVIDER_SITE_OTHER): Payer: Medicare Other | Admitting: Family Medicine

## 2012-02-17 VITALS — BP 120/60 | HR 80 | Temp 97.4°F | Wt 170.0 lb

## 2012-02-17 DIAGNOSIS — R350 Frequency of micturition: Secondary | ICD-10-CM

## 2012-02-17 DIAGNOSIS — W19XXXA Unspecified fall, initial encounter: Secondary | ICD-10-CM

## 2012-02-17 DIAGNOSIS — Y92009 Unspecified place in unspecified non-institutional (private) residence as the place of occurrence of the external cause: Secondary | ICD-10-CM

## 2012-02-17 NOTE — Patient Instructions (Addendum)
I wouldn't stop the xarelto.  If you need more pain medicine or a referral to podiatry, then notify the clinic.  If you get constipated from the pain medicine, then take colace or miralax.

## 2012-02-17 NOTE — Progress Notes (Signed)
He fell filling a bird feeder after the snow storm.  Fell on uneven ground with slick shoes. No LOC.  R shoulder pain and buttock pain.  Seen at ER.   Neg imaging w/o acute process at the ER.  Sent home with rx for hydrocodone.  No pain sitting.  Pain with movement of R hip and R shoulder.  On xarelto.  Here for recheck.   His urinary sx are improved with flomax.    Meds, vitals, and allergies reviewed.   ROS: See HPI.  Otherwise, noncontributory.  nad ncat R shoulder with pain with ROM above the head but no arm drop.  Distally nv intact. + impingement.  abd soft Back w/o midline pain R hip not ttp, no pain with internal rotation but R buttock feels as though there is a deep bruise, through no superficial bruising is noted Able to walk with limp

## 2012-02-19 DIAGNOSIS — W19XXXA Unspecified fall, initial encounter: Secondary | ICD-10-CM | POA: Insufficient documentation

## 2012-02-19 NOTE — Assessment & Plan Note (Signed)
D/w pt about fall cautions and he understood.  Okay to use vicodin now for pain, sedation and constipation caution given.  This should gradually improved, no need to reimage today.  He agrees.  F/u prn.  Would continue xarelto for now.

## 2012-02-19 NOTE — Assessment & Plan Note (Signed)
Improved with flomax per patient report.

## 2012-02-21 ENCOUNTER — Telehealth: Payer: Self-pay

## 2012-02-21 NOTE — Telephone Encounter (Signed)
Pt left v/m message requesting call back. No other message. Left v/m for pt to call back.

## 2012-02-22 NOTE — Telephone Encounter (Signed)
Yes and the form is done.  It's in my office. Thanks.

## 2012-02-22 NOTE — Telephone Encounter (Signed)
I called pt back; pt wants to know if is eligible for handicap placard for short period due to recent fall and difficult for pt to walk long distance Please advise.

## 2012-02-23 NOTE — Telephone Encounter (Signed)
Patient advised.  Form left at front desk for pick up.

## 2012-03-07 ENCOUNTER — Other Ambulatory Visit: Payer: Self-pay | Admitting: Cardiology

## 2012-03-15 ENCOUNTER — Encounter: Payer: Self-pay | Admitting: Family Medicine

## 2012-03-15 ENCOUNTER — Ambulatory Visit (INDEPENDENT_AMBULATORY_CARE_PROVIDER_SITE_OTHER): Payer: Medicare Other | Admitting: Family Medicine

## 2012-03-15 VITALS — BP 102/68 | HR 60 | Temp 97.5°F | Wt 167.0 lb

## 2012-03-15 DIAGNOSIS — H6120 Impacted cerumen, unspecified ear: Secondary | ICD-10-CM

## 2012-03-15 NOTE — Progress Notes (Signed)
Insurance company rep came to the house and talked to pt about getting his ears cleaned out before seeing the hearing clinic.  His hearing is noted by his wife to be affected.  No ear pain.  Occ rhinorrhea from allergies.    Meds, vitals, and allergies reviewed.   ROS: See HPI.  Otherwise, noncontributory.  nad ncat B cerumen impaction noted, improved with irrigation.  No complications.

## 2012-03-15 NOTE — Patient Instructions (Addendum)
Get some water in both ear canals while in the shower to try to prevent wax build up.  If you need help getting set up at the hearing clinic, then let us know.  Take care.

## 2012-03-15 NOTE — Assessment & Plan Note (Signed)
B cerumen impaction noted, improved with irrigation.  No complications. He'll f/u with audiology.

## 2012-05-08 ENCOUNTER — Other Ambulatory Visit: Payer: Self-pay | Admitting: Cardiology

## 2012-05-09 ENCOUNTER — Other Ambulatory Visit: Payer: Self-pay | Admitting: *Deleted

## 2012-05-09 MED ORDER — TADALAFIL 10 MG PO TABS: 10.0000 mg | ORAL_TABLET | Freq: Every day | ORAL | Status: DC | PRN

## 2012-05-11 ENCOUNTER — Other Ambulatory Visit: Payer: Self-pay | Admitting: Family Medicine

## 2012-05-11 DIAGNOSIS — D649 Anemia, unspecified: Secondary | ICD-10-CM

## 2012-05-11 DIAGNOSIS — I10 Essential (primary) hypertension: Secondary | ICD-10-CM

## 2012-05-15 ENCOUNTER — Telehealth: Payer: Self-pay

## 2012-05-15 MED ORDER — TADALAFIL 5 MG PO TABS: 5.0000 mg | ORAL_TABLET | Freq: Every day | ORAL | Status: DC | PRN

## 2012-05-15 NOTE — Telephone Encounter (Signed)
Sent!

## 2012-05-15 NOTE — Telephone Encounter (Signed)
Pt tried 2 pharmacies and cost of Cialis 10 mg # 15 is $530.00. [facility] on [address] said Cialis 5 mg is much less expensive. Pt request Cialis 5 mg sent to [facility].Please advise.

## 2012-05-23 ENCOUNTER — Other Ambulatory Visit (INDEPENDENT_AMBULATORY_CARE_PROVIDER_SITE_OTHER): Payer: Medicare Other

## 2012-05-23 DIAGNOSIS — D649 Anemia, unspecified: Secondary | ICD-10-CM

## 2012-05-23 DIAGNOSIS — E785 Hyperlipidemia, unspecified: Secondary | ICD-10-CM

## 2012-05-23 DIAGNOSIS — I10 Essential (primary) hypertension: Secondary | ICD-10-CM

## 2012-05-23 LAB — LIPID PANEL
HDL: 33.7 mg/dL — ABNORMAL LOW (ref >39.00)
Total CHOL/HDL Ratio: 6
Triglycerides: 157 mg/dL — ABNORMAL HIGH (ref 0.0–149.0)
VLDL: 31.4 mg/dL (ref 0.0–40.0)

## 2012-05-23 LAB — COMPREHENSIVE METABOLIC PANEL
Albumin: 4 g/dL (ref 3.5–5.2)
BUN: 27 mg/dL — ABNORMAL HIGH (ref 6–23)
CO2: 25 mEq/L (ref 19–32)
Calcium: 8.7 mg/dL (ref 8.4–10.5)
Chloride: 108 mEq/L (ref 96–112)
Creatinine, Ser: 1.6 mg/dL — ABNORMAL HIGH (ref 0.4–1.5)
GFR: 44.83 mL/min — ABNORMAL LOW (ref >60.00)
Glucose, Bld: 98 mg/dL (ref 70–99)
Potassium: 4.3 mEq/L (ref 3.5–5.1)

## 2012-05-23 LAB — CBC WITH DIFFERENTIAL/PLATELET
Basophils Relative: 0.4 % (ref 0.0–3.0)
Eosinophils Absolute: 0.1 10*3/uL (ref 0.0–0.7)
Eosinophils Relative: 1.4 % (ref 0.0–5.0)
Lymphocytes Relative: 21 % (ref 12.0–46.0)
MCHC: 34.4 g/dL (ref 30.0–36.0)
Monocytes Relative: 10.5 % (ref 3.0–12.0)
Neutrophils Relative %: 66.7 % (ref 43.0–77.0)
RBC: 4.12 Mil/uL — ABNORMAL LOW (ref 4.22–5.81)
WBC: 7.6 10*3/uL (ref 4.5–10.5)

## 2012-06-01 ENCOUNTER — Ambulatory Visit (INDEPENDENT_AMBULATORY_CARE_PROVIDER_SITE_OTHER): Payer: Medicare Other | Admitting: Family Medicine

## 2012-06-01 ENCOUNTER — Encounter: Payer: Self-pay | Admitting: Family Medicine

## 2012-06-01 ENCOUNTER — Ambulatory Visit (INDEPENDENT_AMBULATORY_CARE_PROVIDER_SITE_OTHER)
Admission: RE | Admit: 2012-06-01 | Discharge: 2012-06-01 | Disposition: A | Discharge status: Home / Self Care | Payer: Medicare Other | Source: Ambulatory Visit | Attending: Family Medicine | Admitting: Family Medicine

## 2012-06-01 VITALS — BP 120/82 | HR 60 | Temp 97.5°F | Ht 71.0 in | Wt 169.5 lb

## 2012-06-01 DIAGNOSIS — I4891 Unspecified atrial fibrillation: Secondary | ICD-10-CM

## 2012-06-01 DIAGNOSIS — R350 Frequency of micturition: Secondary | ICD-10-CM

## 2012-06-01 DIAGNOSIS — I480 Paroxysmal atrial fibrillation: Secondary | ICD-10-CM

## 2012-06-01 DIAGNOSIS — Z1331 Encounter for screening for depression: Secondary | ICD-10-CM

## 2012-06-01 DIAGNOSIS — Z0000 Encounter for general adult medical examination without abnormal findings: Secondary | ICD-10-CM

## 2012-06-01 DIAGNOSIS — R0602 Shortness of breath: Secondary | ICD-10-CM

## 2012-06-01 DIAGNOSIS — E785 Hyperlipidemia, unspecified: Secondary | ICD-10-CM

## 2012-06-01 DIAGNOSIS — R799 Abnormal finding of blood chemistry, unspecified: Secondary | ICD-10-CM

## 2012-06-01 DIAGNOSIS — J939 Pneumothorax, unspecified: Secondary | ICD-10-CM

## 2012-06-01 DIAGNOSIS — R7989 Other specified abnormal findings of blood chemistry: Secondary | ICD-10-CM

## 2012-06-01 DIAGNOSIS — D539 Nutritional anemia, unspecified: Secondary | ICD-10-CM

## 2012-06-01 LAB — BASIC METABOLIC PANEL
CO2: 26 mEq/L (ref 19–32)
Calcium: 9 mg/dL (ref 8.4–10.5)
Creatinine, Ser: 1.5 mg/dL (ref 0.4–1.5)

## 2012-06-01 NOTE — Progress Notes (Signed)
I have personally reviewed the Medicare Annual Wellness questionnaire and have noted 1. The patient's medical and social history 2. Their use of alcohol, tobacco or illicit drugs 3. Their current medications and supplements 4. The patient's functional ability including ADL's, fall risks, home safety risks and hearing or visual             impairment. 5. Diet and physical activities 6. Evidence for depression or mood disorders  The patients weight, height, BMI have been recorded in the chart and visual acuity is per eye clinic.  I have made referrals, counseling and provided education to the patient based review of the above and I have provided the pt with a written personalized care plan for preventive services.  See scanned forms.  Routine anticipatory guidance given to patient.  See health maintenance. Flu 2013 Shingles 2008 PNA 2010 Tetanus 2011 Colonoscopy 2006, not indicated now with age >61, d/w pt and he agress.  Prostate cancer screening not indicated, dw pt.  Advance directive- wife would be designated if incapacitated.  Cognitive function addressed- see scanned forms- and if abnormal then additional documentation follows.   H/o PAF, no palpitations or CP per patient.  Compliant with current meds. Reviewed.   See below re SOB.   H/o PTX but no new/acute sx.  He has had slightly inc in SOB over the last few months w/o CP.  No clear cause identified yet.  No wheeze.  Not SOB at rest.  No BLE edema.   H/o HLD, off crestor due to aches, resolved off statin. Lipids reviewed.   H/o BPH, dec in nocturia on flomax w/o any ADE on med. Doing well.   Inc in Cr noted on labs. D/w pt.  See notes on f/u labs.    PMH and SH reviewed  Meds, vitals, and allergies reviewed.   ROS: See HPI.  Otherwise negative.    GEN: nad, alert and oriented HEENT: mucous membranes moist NECK: supple w/o LA CV: sounds to be rrr. PULM: ctab, no inc wob ABD: soft, +bs EXT: no edema SKIN: no acute  rash He has palpable DP pulses B

## 2012-06-01 NOTE — Patient Instructions (Signed)
Don't change your meds for now.  Call cardiology about getting seen.  Go to the lab on the way out.  We'll contact you with your lab and xray report. Take care.  Glad to see you.

## 2012-06-03 ENCOUNTER — Encounter: Payer: Self-pay | Admitting: Family Medicine

## 2012-06-03 DIAGNOSIS — R0602 Shortness of breath: Secondary | ICD-10-CM | POA: Insufficient documentation

## 2012-06-03 NOTE — Assessment & Plan Note (Signed)
Defer to cards, he'll call about f/u.

## 2012-06-03 NOTE — Assessment & Plan Note (Signed)
Repeat CXR today given the SOB.  ctab today.

## 2012-06-03 NOTE — Assessment & Plan Note (Signed)
Stable, labs reviewed.

## 2012-06-03 NOTE — Addendum Note (Signed)
Addended by: Tonia Ghent on: 06/03/2012 11:02 PM   Modules accepted: Orders

## 2012-06-03 NOTE — Assessment & Plan Note (Signed)
With BPH prev.  No need to check PSA or DRE today.  Sx stable by pt report.  Continue current meds.

## 2012-06-03 NOTE — Assessment & Plan Note (Signed)
He'll call about f/u with cards.  I'll defer the statin to cards as we worked on his inc in Cr and the SOB today.

## 2012-06-03 NOTE — Assessment & Plan Note (Signed)
Recheck bmet today.  Would expect some age related changes, discussed, see notes on labs.

## 2012-06-03 NOTE — Assessment & Plan Note (Signed)
See scanned forms.  Routine anticipatory guidance given to patient.  See health maintenance. Flu 2013 Shingles 2008 PNA 2010 Tetanus 2011 Colonoscopy 2006, not indicated now with age >46, d/w pt and he agress.  Prostate cancer screening not indicated, dw pt.  Advance directive- wife would be designated if incapacitated.  Cognitive function addressed- see scanned forms- and if abnormal then additional documentation follows.

## 2012-06-03 NOTE — Assessment & Plan Note (Signed)
Check cxr today. No sx at time of exam. I would like cards input on testing patient, ie treadmill or o/w. Pt to call about cards f/u.  No CP.

## 2012-06-26 ENCOUNTER — Encounter: Payer: Self-pay | Admitting: *Deleted

## 2012-06-28 ENCOUNTER — Encounter: Payer: Self-pay | Admitting: Cardiology

## 2012-06-28 ENCOUNTER — Ambulatory Visit (INDEPENDENT_AMBULATORY_CARE_PROVIDER_SITE_OTHER): Payer: Medicare Other | Admitting: Cardiology

## 2012-06-28 VITALS — BP 138/68 | HR 44 | Ht 71.0 in | Wt 166.8 lb

## 2012-06-28 DIAGNOSIS — I4891 Unspecified atrial fibrillation: Secondary | ICD-10-CM

## 2012-06-28 DIAGNOSIS — I480 Paroxysmal atrial fibrillation: Secondary | ICD-10-CM

## 2012-06-28 DIAGNOSIS — R0602 Shortness of breath: Secondary | ICD-10-CM

## 2012-06-28 DIAGNOSIS — R0609 Other forms of dyspnea: Secondary | ICD-10-CM

## 2012-06-28 NOTE — Patient Instructions (Addendum)
Stop metoprolol.   Your physician has requested that you have en exercise stress myoview. For further information please visit [email or web address]. Please follow instruction sheet, as given.  Your physician has requested that you have an echocardiogram. Echocardiography is a painless test that uses sound waves to create images of your heart. It provides your doctor with information about the size and shape of your heart and how well your heart's chambers and valves are working. This procedure takes approximately one hour. There are no restrictions for this procedure.  Your physician recommends that you schedule a follow-up appointment in: 6 weeks with PA/NP.   Your physician wants you to follow-up in: 6 months with Dr Aundra Dubin. (December 2014).  You will receive a reminder letter in the mail two months in advance. If you don't receive a letter, please call our office to schedule the follow-up appointment.
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
## 2012-07-03 ENCOUNTER — Ambulatory Visit (HOSPITAL_COMMUNITY): Payer: Medicare Other | Attending: Cardiovascular Disease

## 2012-07-03 DIAGNOSIS — R0609 Other forms of dyspnea: Secondary | ICD-10-CM | POA: Insufficient documentation

## 2012-07-03 DIAGNOSIS — I739 Peripheral vascular disease, unspecified: Secondary | ICD-10-CM | POA: Insufficient documentation

## 2012-07-03 DIAGNOSIS — R0989 Other specified symptoms and signs involving the circulatory and respiratory systems: Secondary | ICD-10-CM | POA: Insufficient documentation

## 2012-07-03 DIAGNOSIS — I4891 Unspecified atrial fibrillation: Secondary | ICD-10-CM | POA: Insufficient documentation

## 2012-07-03 NOTE — Progress Notes (Signed)
Echocardiogram performed.

## 2012-07-05 ENCOUNTER — Ambulatory Visit (HOSPITAL_COMMUNITY): Payer: Medicare Other | Attending: Cardiology | Admitting: Radiology

## 2012-07-05 VITALS — BP 133/74 | HR 47 | Ht 71.0 in | Wt 167.0 lb

## 2012-07-05 DIAGNOSIS — I4510 Unspecified right bundle-branch block: Secondary | ICD-10-CM

## 2012-07-05 DIAGNOSIS — R0602 Shortness of breath: Secondary | ICD-10-CM | POA: Insufficient documentation

## 2012-07-05 DIAGNOSIS — I4891 Unspecified atrial fibrillation: Secondary | ICD-10-CM

## 2012-07-05 DIAGNOSIS — R0609 Other forms of dyspnea: Secondary | ICD-10-CM

## 2012-07-05 MED ORDER — TECHNETIUM TC 99M SESTAMIBI GENERIC - CARDIOLITE
10.0000 | Freq: Once | INTRAVENOUS | Status: AC | PRN
  Administered 2012-07-05: 10 via INTRAVENOUS

## 2012-07-05 MED ORDER — REGADENOSON 0.4 MG/5ML IV SOLN
0.4000 mg | Freq: Once | INTRAVENOUS | Status: AC
  Administered 2012-07-05: 0.4 mg via INTRAVENOUS

## 2012-07-05 MED ORDER — TECHNETIUM TC 99M SESTAMIBI GENERIC - CARDIOLITE
30.0000 | Freq: Once | INTRAVENOUS | Status: AC | PRN
  Administered 2012-07-05: 30 via INTRAVENOUS

## 2012-07-05 NOTE — Progress Notes (Addendum)
[facility] [address] [place], [place] [address] [phone]    Cardiology Nuclear Med Study  [name] [facility] is a 79 y.o. male     MRN : [id number]     DOB: 1928-06-11  Procedure Date: 07/05/2012  Nuclear Med Background Indication for Stress Test:  Evaluation for Ischemia History:  h/o PAF; '12 Echo:EF=60% Cardiac Risk Factors: History of Smoking, Lipids and RBBB  Symptoms:  DOE   Nuclear Pre-Procedure Caffeine/Decaff Intake:  None NPO After: 9:00pm   Lungs:  Clear. O2 Sat: 99% on room air. IV 0.9% NS with Angio Cath:  22g  IV Site: R Wrist  IV Started by:  Eliezer Lofts, EMT-P  Chest Size (in):  40 Cup Size: n/a  Height: 5' 11" (1.803 m)  Weight:  167 lb (75.751 kg)  BMI:  Body mass index is 23.3 kg/(m^2). Tech Comments:  NA    Nuclear Med Study 1 or 2 day study: 1 day  Stress Test Type:  Lexiscan  Reading MD: Darlin Coco, MD  Order Authorizing Provider:  [name], MD  Resting Radionuclide: Technetium 75mSestamibi  Resting Radionuclide Dose: 11.0 mCi   Stress Radionuclide:  Technetium 925mestamibi  Stress Radionuclide Dose: 33.0 mCi           Stress Protocol Rest HR: 47 Stress HR: 77  Rest BP: 133/74 Stress BP: 146/76  Exercise Time (min): n/a METS: n/a   Predicted Max HR: 138 bpm % Max HR: 55.8 bpm Rate Pressure Product: 11242   Dose of Adenosine (mg):  n/a Dose of Lexiscan: 0.4 mg  Dose of Atropine (mg): n/a Dose of Dobutamine: n/a mcg/kg/min (at max HR)  Stress Test Technologist: ShLetta MoynahanCMA-N  Nuclear Technologist:  StCharlton AmorCNMT     Rest Procedure:  Myocardial perfusion imaging was performed at rest 45 minutes following the intravenous administration of Technetium 9930mstamibi.  Rest ECG: NSR-RBBB  Stress Procedure:  The patient received IV Lexiscan 0.4 mg over 15-seconds.  Technetium 55m73mtamibi injected at 30-seconds.  He did c/o chest tightness with Lexiscan.  Quantitative spect images  were obtained after a 45 minute delay.  Stress ECG: No significant change from baseline ECG  QPS Raw Data Images:  Normal; no motion artifact; normal heart/lung ratio. Stress Images:  Normal homogeneous uptake in all areas of the myocardium. Rest Images:  Normal homogeneous uptake in all areas of the myocardium. Subtraction (SDS):  No evidence of ischemia. Transient Ischemic Dilatation (Normal <1.22):  0.82 Lung/Heart Ratio (Normal <0.45):  0.11  Quantitative Gated Spect Images QGS EDV:  n/a QGS ESV:  n/a  Impression Exercise Capacity:  Lexiscan with no exercise. BP Response:  Normal blood pressure response. Clinical Symptoms:  No chest pain. ECG Impression:  No significant ST segment change suggestive of ischemia. Comparison with Prior Nuclear Study: No previous nuclear study performed  Overall Impression:  Normal stress nuclear study. No ischemia or infarction.  LV Ejection Fraction: Study not gated.  LV Wall Motion:  Study not gated  [employer]al study.  Please inform patient.   DaltLoralie Champagne0/2014

## 2012-07-09 ENCOUNTER — Other Ambulatory Visit: Payer: Self-pay | Admitting: Cardiology

## 2012-07-09 NOTE — Progress Notes (Signed)
Pt notified 

## 2012-08-07 ENCOUNTER — Ambulatory Visit: Payer: Medicare Other | Admitting: Physician Assistant

## 2012-08-22 ENCOUNTER — Encounter: Payer: Self-pay | Admitting: Physician Assistant

## 2012-08-22 ENCOUNTER — Ambulatory Visit (INDEPENDENT_AMBULATORY_CARE_PROVIDER_SITE_OTHER): Payer: Medicare Other | Admitting: Physician Assistant

## 2012-08-22 VITALS — BP 122/74 | HR 85 | Ht 71.0 in | Wt 168.0 lb

## 2012-08-22 DIAGNOSIS — I4891 Unspecified atrial fibrillation: Secondary | ICD-10-CM

## 2012-08-22 DIAGNOSIS — R0602 Shortness of breath: Secondary | ICD-10-CM
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
## 2012-08-22 NOTE — Patient Instructions (Addendum)
Your physician recommends that you continue on your current medications as directed. Please refer to the Current Medication list given to you today.  Your physician recommends that you schedule a follow-up appointment in: December 2014 WITH [facility]

## 2012-09-13 ENCOUNTER — Ambulatory Visit (INDEPENDENT_AMBULATORY_CARE_PROVIDER_SITE_OTHER): Payer: Medicare Other | Admitting: Family Medicine

## 2012-09-13 ENCOUNTER — Encounter: Payer: Self-pay | Admitting: Family Medicine

## 2012-09-13 VITALS — BP 110/70 | HR 60 | Temp 98.0°F | Wt 169.8 lb

## 2012-09-13 DIAGNOSIS — L989 Disorder of the skin and subcutaneous tissue, unspecified: Secondary | ICD-10-CM

## 2012-09-13 MED ORDER — TRIAMCINOLONE ACETONIDE 0.1 % EX CREA: TOPICAL_CREAM | Freq: Two times a day (BID) | CUTANEOUS | Status: DC

## 2012-09-13 NOTE — Patient Instructions (Signed)
Use the cream twice a day and if not improving (or if getting worse) then let me know.   Take care.

## 2012-09-13 NOTE — Assessment & Plan Note (Signed)
Isolated to R lower leg.  No other lesions other places. Would use TAC for now, for the irritation. If not healed, we can refer to derm for biopsy.  He agrees.  This doesn't look like a skin cancer, just slowly healing irritated skin.

## 2012-09-13 NOTE — Progress Notes (Signed)
Scaly places on legs.  Noted about 6 weeks ago.  On R lateral ankle.  No known trauma, no bites. No known rhus exposure.  Feeling well o/w. Getting smaller very slowly.  Not itchy.   Meds, vitals, and allergies reviewed.   ROS: See HPI.  Otherwise, noncontributory.  nad R lower shin and ankle with several blanching red macules that appear irritated.  All except 1 lesion are <1cm diameter.  No ulceration.  Other lesion is 2x1 cm, appearing to be superficial and irritated.

## 2012-10-23 ENCOUNTER — Other Ambulatory Visit: Payer: Self-pay | Admitting: Dermatology

## 2012-11-04 ENCOUNTER — Other Ambulatory Visit: Payer: Self-pay | Admitting: Cardiology

## 2012-11-26 ENCOUNTER — Ambulatory Visit (INDEPENDENT_AMBULATORY_CARE_PROVIDER_SITE_OTHER): Payer: Medicare Other | Admitting: Family Medicine

## 2012-11-26 ENCOUNTER — Ambulatory Visit (INDEPENDENT_AMBULATORY_CARE_PROVIDER_SITE_OTHER)
Admission: RE | Admit: 2012-11-26 | Discharge: 2012-11-26 | Disposition: A | Discharge status: Home / Self Care | Payer: Medicare Other | Source: Ambulatory Visit | Attending: Family Medicine | Admitting: Family Medicine

## 2012-11-26 ENCOUNTER — Encounter: Payer: Self-pay | Admitting: Family Medicine

## 2012-11-26 VITALS — BP 108/68 | HR 67 | Temp 97.4°F | Wt 172.2 lb

## 2012-11-26 DIAGNOSIS — E785 Hyperlipidemia, unspecified: Secondary | ICD-10-CM

## 2012-11-26 DIAGNOSIS — R0602 Shortness of breath: Secondary | ICD-10-CM

## 2012-11-26 LAB — COMPREHENSIVE METABOLIC PANEL
AST: 20 U/L (ref 0–37)
Albumin: 4.1 g/dL (ref 3.5–5.2)
Alkaline Phosphatase: 61 U/L (ref 39–117)
BUN: 22 mg/dL (ref 6–23)
Creatinine, Ser: 1.3 mg/dL (ref 0.4–1.5)
Potassium: 4.3 mEq/L (ref 3.5–5.1)

## 2012-11-26 LAB — CBC WITH DIFFERENTIAL/PLATELET
Basophils Relative: 0.3 % (ref 0.0–3.0)
Eosinophils Absolute: 0.1 10*3/uL (ref 0.0–0.7)
Hemoglobin: 14.2 g/dL (ref 13.0–17.0)
Lymphs Abs: 1.6 10*3/uL (ref 0.7–4.0)
MCHC: 33.5 g/dL (ref 30.0–36.0)
MCV: 106.3 fl — ABNORMAL HIGH (ref 78.0–100.0)
Monocytes Absolute: 0.9 10*3/uL (ref 0.1–1.0)
Neutro Abs: 7.1 10*3/uL (ref 1.4–7.7)
RBC: 3.98 Mil/uL — ABNORMAL LOW (ref 4.22–5.81)

## 2012-11-26 NOTE — Patient Instructions (Signed)
Go to the lab on the way out.  We'll contact you with your lab and report. Take care.  Limit exercise in the meantime.
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
## 2012-11-26 NOTE — Assessment & Plan Note (Addendum)
Check CMBT CBC BNP TSH CXR today.  He could have a chronic resp failure and may need O2 but I would like to see his labs and imaging first.  Nontoxic, not in distress on exam.  Okay for outpatient f/u.  We talked about potentially getting outpatient pulm eval. He agrees with plan.  He will restart anticoagulation shortly and the bruise should resolve.

## 2012-11-27 NOTE — Addendum Note (Signed)
Addended by: Tonia Ghent on: 11/27/2012 01:06 AM   Modules accepted: Orders

## 2012-12-19 ENCOUNTER — Ambulatory Visit (INDEPENDENT_AMBULATORY_CARE_PROVIDER_SITE_OTHER): Payer: Medicare Other | Admitting: Pulmonary Disease

## 2012-12-19 ENCOUNTER — Encounter: Payer: Self-pay | Admitting: Pulmonary Disease

## 2012-12-19 VITALS — BP 128/74 | HR 50 | Temp 97.6°F | Ht 70.0 in | Wt 171.0 lb

## 2012-12-19 DIAGNOSIS — J449 Chronic obstructive pulmonary disease, unspecified: Secondary | ICD-10-CM

## 2012-12-19 DIAGNOSIS — J441 Chronic obstructive pulmonary disease with (acute) exacerbation: Secondary | ICD-10-CM

## 2012-12-19 MED ORDER — TIOTROPIUM BROMIDE MONOHYDRATE 18 MCG IN CAPS: 18.0000 ug | ORAL_CAPSULE | Freq: Every day | RESPIRATORY_TRACT | Status: DC

## 2012-12-19 MED ORDER — ALBUTEROL SULFATE HFA 108 (90 BASE) MCG/ACT IN AERS: 2.0000 | INHALATION_SPRAY | RESPIRATORY_TRACT | Status: DC | PRN

## 2012-12-19 NOTE — Patient Instructions (Signed)
Use the Spiriva daily no matter how you feel Use the ProAir (albuterol) inhaler 2 puffs every four hours as needed for shortness of breath WE will see you back in 8 weeks or sooner if needed

## 2012-12-19 NOTE — Assessment & Plan Note (Signed)
COPD: GOLD Grade A Combined recommendations from the [employer], [employer], Investment banker, corporate, [place] (Qaseem A et al, Ann Intern Med. 2011;155(3):179) recommends tobacco cessation, pulmonary rehab (for symptomatic patients with an FEV1 < 50% predicted), supplemental oxygen (for patients with SaO2 <88% or paO2 <55), and appropriate bronchodilator therapy.  In regards to long acting bronchodilators, they recommend monotherapy (FEV1 60-80% with symptoms weak evidence, FEV1 with symptoms <60% strong evidence), or combination therapy (FEV1 <60% with symptoms, strong recommendation, moderate evidence).  One should also provide patients with annual immunizations and consider therapy for prevention of COPD exacerbations (ie. roflumilast or azithromycin) when appopriate.  -O2 therapy: Not indicated -Immunizations: UTD -Tobacco use: Quit 1971 -Exercise: Encouraged regular exercise -Bronchodilator therapy: Add spiriva daily , prn albuterol -Exacerbation prevention: Spiriva

## 2012-12-19 NOTE — Progress Notes (Signed)
Subjective:    Patient ID: [name], male    DOB: 1928-07-05, 79 y.o.   MRN: [id number]  HPI  Is a very pleasant 79 year old male comes to my clinic today for evaluation of shortness of breath. He does not believe that he has a prior history of lung disease with the exception of 2 spontaneous pneumothoraces which occurred in 2012. This required thorascopic intervention by Dr. Roxan Hockey in [place]. A CT scan performed in 2012 showed significant emphysema. Lung function testing performed in 2012 was suggestive of airflow obstruction but was not acceptable bite American thoracic Society criteria.  For the last several months he has noticed increasing shortness of breath with exertion. This started after a fall last winter. He tried increasing his exercise tolerance after the fall to see if it would help but it did not. He was evaluated by cardiologist and to his knowledge he does not believe that he has significant coronary or other cardiac disease.  He has noticed dyspnea when climbing a flight of stairs or a hill. He still plays golf regularly and states that he can hit the ball well but he has a hard time chasing it down. He occasionally coughs because of some sinus congestion and he does produce sputum on a daily basis. He currently does not use any inhalers. He has not had chest pain or leg swelling.   Past Medical History  Diagnosis Date  . Hyperlipidemia   . Spontaneous pneumothorax 09/04/2010    [name]  . Erectile dysfunction   . AF (atrial fibrillation)     after PTX 2012  . Coronary artery disease   . HSV-1 (herpes simplex virus 1) infection   . Elevated MCV   . Allergy   . GERD (gastroesophageal reflux disease)   . Arthritis   . BPH (benign prostatic hyperplasia)   . Back pain      Family History  Problem Relation Age of Onset  . Diabetes Mother   . Prostate cancer Neg Hx   . Colon cancer Brother     possible dx, not certain  . Peripheral Artery  Disease Father      History   Social History  . Marital Status: Married    Spouse Name: N/A    Number of Children: N/A  . Years of Education: N/A   Occupational History  . Not on file.   Social History Main Topics  . Smoking status: Former Smoker -- 1.00 packs/day for 50 years    Types: Cigarettes    Quit date: 04/14/1999  . Smokeless tobacco: Not on file  . Alcohol Use: No     Comment: h/o use  . Drug Use: No  . Sexual Activity: Yes   Other Topics Concern  . Not on file   Social History Narrative   Occupation:Retired [employer]   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 Blistex     REACTION: lip swelling  . Rosuvastatin     REACTION: myalgias at 43m/day dose     Outpatient Prescriptions Prior to Visit  Medication Sig Dispense Refill  . acetaminophen (TYLENOL) 325 MG tablet Take 650 mg by mouth every 6 (six) hours as needed. For pain      . beta carotene w/minerals (OCUVITE) tablet Take 1 tablet by mouth daily.        . fluticasone (FLONASE) 50  MCG/ACT nasal spray PLACE 2 SPRAYS INTO THE NOSE DAILY.  16 g  3  . omeprazole (PRILOSEC) 20 MG capsule Take 20 mg by mouth daily.        . Tamsulosin HCl (FLOMAX) 0.4 MG CAPS Take 1 capsule (0.4 mg total) by mouth daily.  30 capsule  12  . traZODone (DESYREL) 150 MG tablet Take 1 tablet (150 mg total) by mouth at bedtime. For insomnia  90 tablet  3  . triamcinolone cream (KENALOG) 0.1 % Apply topically 2 (two) times daily.  30 g  0  . valACYclovir (VALTREX) 500 MG tablet Take 500 mg by mouth 2 (two) times daily as needed. For flair ups      . XARELTO 15 MG TABS tablet TAKE 1 TABLET EVERY DAY  30 tablet  6  . loratadine (CLARITIN) 10 MG tablet Take 10 mg by mouth daily.       No facility-administered medications prior to visit.      Review of Systems  Constitutional: Negative for fever, chills, diaphoresis, activity change, appetite change, fatigue  and unexpected weight change.  HENT: Positive for congestion, postnasal drip, rhinorrhea and sinus pressure. Negative for dental problem, ear discharge, ear pain, facial swelling, hearing loss, mouth sores, nosebleeds, sneezing, sore throat, tinnitus, trouble swallowing and voice change.   Eyes: Negative for photophobia, discharge, redness, itching and visual disturbance.  Respiratory: Positive for cough and shortness of breath. Negative for apnea, choking, chest tightness, wheezing and stridor.   Cardiovascular: Positive for leg swelling. Negative for chest pain and palpitations.  Gastrointestinal: Negative for nausea, vomiting, abdominal pain, constipation, blood in stool and abdominal distention.  Genitourinary: Negative for dysuria, urgency, frequency, hematuria, flank pain, decreased urine volume and difficulty urinating.  Musculoskeletal: Negative for arthralgias, back pain, gait problem, joint swelling, myalgias, neck pain and neck stiffness.  Skin: Negative for color change, pallor and rash.  Neurological: Negative for dizziness, tremors, seizures, syncope, speech difficulty, weakness, light-headedness, numbness and headaches.  Hematological: Negative for adenopathy.  Psychiatric/Behavioral: Negative for confusion, sleep disturbance, dysphoric mood and agitation. The patient is not nervous/anxious.        Objective:   Physical Exam  Filed Vitals:   12/19/12 1211  BP: 128/74  Pulse: 50  Temp: 97.6 F (36.4 C)  TempSrc: Oral  Height: _0  (1.778 m)  Weight: 171 lb (77.565 kg)  SpO2: 95%   RA  Walked 500 feet and did not desaturated less than 95%  Gen: well appearing, no acute distress HEENT: NCAT, PERRL, EOMi, OP clear, neck supple without masses PULM: few exp wheezes, hyperresonant to percussion CV: Irreg irreg, slight systolic murmur, no JVD AB: BS+, soft, nontender, no hsm Ext: warm, no edema, no clubbing, no cyanosis Derm: no rash or skin breakdown Neuro: A&Ox4, CN  II-XII intact, strength 5/5 in all 4 extremities  2012 CT chest reviewed> chest tube in the left lung, pneumothorax in the left lung, there is significant emphysema in both lungs bilaterally with bullae     Assessment & Plan:   COPD COPD: GOLD Grade A Combined recommendations from the [place], [employer] of Chest Physicians, Investment banker, corporate, European Respiratory Society (Qaseem A et al, Ann Intern Med. 2011;155(3):179) recommends tobacco cessation, pulmonary rehab (for symptomatic patients with an FEV1 < 50% predicted), supplemental oxygen (for patients with SaO2 <88% or paO2 <55), and appropriate bronchodilator therapy.  In regards to long acting bronchodilators, they recommend monotherapy (FEV1 60-80% with symptoms weak evidence, FEV1  with symptoms <60% strong evidence), or combination therapy (FEV1 <60% with symptoms, strong recommendation, moderate evidence).  One should also provide patients with annual immunizations and consider therapy for prevention of COPD exacerbations (ie. roflumilast or azithromycin) when appopriate.  -O2 therapy: Not indicated -Immunizations: UTD -Tobacco use: Quit 1971 -Exercise: Encouraged regular exercise -Bronchodilator therapy: Add spiriva daily , prn albuterol -Exacerbation prevention: Spiriva    Updated Medication List Outpatient Encounter Prescriptions as of 12/19/2012  Medication Sig  . acetaminophen (TYLENOL) 325 MG tablet Take 650 mg by mouth every 6 (six) hours as needed. For pain  . beta carotene w/minerals (OCUVITE) tablet Take 1 tablet by mouth daily.    . fluticasone (FLONASE) 50 MCG/ACT nasal spray PLACE 2 SPRAYS INTO THE NOSE DAILY.  Marland Kitchen omeprazole (PRILOSEC) 20 MG capsule Take 20 mg by mouth daily.    . Tamsulosin HCl (FLOMAX) 0.4 MG CAPS Take 1 capsule (0.4 mg total) by mouth daily.  . traZODone (DESYREL) 150 MG tablet Take 1 tablet (150 mg total) by mouth at bedtime. For insomnia  . triamcinolone  cream (KENALOG) 0.1 % Apply topically 2 (two) times daily.  . valACYclovir (VALTREX) 500 MG tablet Take 500 mg by mouth 2 (two) times daily as needed. For flair ups  . XARELTO 15 MG TABS tablet TAKE 1 TABLET EVERY DAY  . albuterol (PROAIR HFA) 108 (90 BASE) MCG/ACT inhaler Inhale 2 puffs into the lungs every 4 (four) hours as needed for wheezing or shortness of breath.  . loratadine (CLARITIN) 10 MG tablet Take 10 mg by mouth daily.  Marland Kitchen tiotropium (SPIRIVA HANDIHALER) 18 MCG inhalation capsule Place 1 capsule (18 mcg total) into inhaler and inhale daily.

## 2012-12-27 ENCOUNTER — Ambulatory Visit: Payer: Medicare Other | Admitting: Cardiology

## 2012-12-31 ENCOUNTER — Encounter: Payer: Self-pay | Admitting: Cardiology

## 2012-12-31 ENCOUNTER — Ambulatory Visit (INDEPENDENT_AMBULATORY_CARE_PROVIDER_SITE_OTHER): Payer: Medicare Other | Admitting: Cardiology

## 2012-12-31 VITALS — BP 138/80 | HR 50 | Ht 70.0 in | Wt 170.0 lb

## 2012-12-31 DIAGNOSIS — I4891 Unspecified atrial fibrillation: Secondary | ICD-10-CM

## 2012-12-31 DIAGNOSIS — R0602 Shortness of breath: Secondary | ICD-10-CM

## 2012-12-31 DIAGNOSIS — I480 Paroxysmal atrial fibrillation: Secondary | ICD-10-CM

## 2012-12-31 NOTE — Progress Notes (Signed)
Patient ID: Abijah Roussel [facility], male   DOB: November 08, 1928, 79 y.o.   MRN: [id number] PCP: Dr. Damita Dunnings  79 yo with paroxysmal atrial fibrillation presents for cardiology followup.  Patient developed a pneuthorax after a violent sneeze in 8/12.  He required a chest tube.  It was suspected that he ruptured a bleb.  While in the hospital, he developed atrial fibrillation with rapid response that converted after amiodarone was begun.  He had had no prior history of atrial fibrillation.  After discharge, 3 week event monitor showed no further atrial fibrillation.  Echo showed normal LV systolic function and no significant valvular dysfunction.  I initially managed him with aspirin and metoprolol and stopped the amiodarone. He went back in the hospital in 11/12 with recurrent left PTX.  This time, he had VATS with blebectomy x 3 and pleural stripping/abrasion.  He developed post-operative atrial fibrillation and was again started on amiodarone, eventually converting back to NSR.  He is in NSR today.  Due to exertional dyspnea, he had a Lexiscan Cardiolite in 6/14 that was normal.  Echo in 6/14 showed normal EF.   No further palpitations.  No chest pain.  He continues to be short of breath with moderate exertion (after walking 200-300 feet).  He is short of breath walking up a hill or up a flight of steps.  No lightheadedness or syncope.  No tachypalpitations.    Labs (8/12): LDL 126, HDL 33, TSH normal, creatinine 1.2 Labs (11/12): K 3.7, creatinine 0.9 (GFR 78) Labs (12/12): Creatinine 1.5, HCT 40 Labs (5/14): K 4.3, creatinine 1.5, LDL 133 Labs (11/14): K 4.3, creatinine 1.3, BNP 60, TSH normal  ECG: NSR with RBBB  PMH: 1. Hyperlipidemia: myalgias with Crestor.  2. Atrial fibrillation: 1 episode has been noted in setting of pneumothorax in 8/12.  Converted back to NSR after starting amiodarone.  3 week event monitor afterwards showed no further atrial fibrillation.  3. Erectile dysfunction.  4. Left  pneumothorax after sneezing in 8/12 due to ruptured bleb.  Recurrent PTX on left in 11/12 with VATS, blebectomy x 3, pleural stripping and abrasion (Dr. Roxan Hockey).  5. COPD: FEV1/FVC 54% in 12/14.  6. ABIs (4/13) were normal 7. CKD 8. Exertional dyspnea: Lexiscan Cardiolite (6/14) with no ischemia or infarction.  Echo (6/14) with EF 55-60%, mild MR.   SH: Lives in [place] with wife.  Quit smoking around 2000 after 50 pack-years.  Retired from [employer].  No ETOH.   FH: Mother with PCM, father with CVA and ? CAD.  Current Outpatient Prescriptions  Medication Sig Dispense Refill  . acetaminophen (TYLENOL) 325 MG tablet Take 650 mg by mouth every 6 (six) hours as needed. For pain      . albuterol (PROAIR HFA) 108 (90 BASE) MCG/ACT inhaler Inhale 2 puffs into the lungs every 4 (four) hours as needed for wheezing or shortness of breath.  1 Inhaler  2  . beta carotene w/minerals (OCUVITE) tablet Take 1 tablet by mouth daily.        . fluticasone (FLONASE) 50 MCG/ACT nasal spray PLACE 2 SPRAYS INTO THE NOSE DAILY.  16 g  3  . loratadine (CLARITIN) 10 MG tablet Take 10 mg by mouth daily.      Marland Kitchen omeprazole (PRILOSEC) 20 MG capsule Take 20 mg by mouth daily.        . Tamsulosin HCl (FLOMAX) 0.4 MG CAPS Take 1 capsule (0.4 mg total) by mouth daily.  30 capsule  12  . tiotropium (  SPIRIVA HANDIHALER) 18 MCG inhalation capsule Place 1 capsule (18 mcg total) into inhaler and inhale daily.  30 capsule  2  . traZODone (DESYREL) 150 MG tablet Take 1 tablet (150 mg total) by mouth at bedtime. For insomnia  90 tablet  3  . triamcinolone cream (KENALOG) 0.1 % Apply topically 2 (two) times daily.  30 g  0  . valACYclovir (VALTREX) 500 MG tablet Take 500 mg by mouth 2 (two) times daily as needed. For flair ups      . XARELTO 15 MG TABS tablet TAKE 1 TABLET EVERY DAY  30 tablet  6   No current facility-administered medications for this visit.    BP 138/80  Pulse 50  Ht 5' 10" (1.778 m)  Wt 77.111 kg (170  lb)  BMI 24.39 kg/m2 General: NAD Neck: No JVD, no thyromegaly or thyroid nodule.  Lungs: Decreased breath sounds bilaterally.  CV: Nondisplaced PMI.  Heart sounds quiet, regular S1/S2, +S4, no murmur.  No peripheral edema.  No carotid bruit.  Feet are warm but I am unable to palpate pedal pulses.   Abdomen: Soft, nontender, no hepatosplenomegaly, no distention.  Neurologic: Alert and oriented x 3.  Psych: Normal affect. Extremities: No clubbing or cyanosis.   Assessment/Plan: 1. Exertional dyspnea:  He is not in atrial fibrillation.  ETT-Cardiolite and echo in 6/14 were unremarkable.  He has been seen by pulmonary and has significant COPD, which is the likely cause of his dyspnea.  He is on Spiriva. 2. Bradycardia: Mild sinus bradycardia.  He is not on nodal blockers.  3. Paroxysmal atrial fibrillation: He has not had symptomatic atrial fibrillation since last appointment.  Continue Xarelto.  Not on nodal blockers due to sinus bradycardia.   Followup in 1 year.   [name] 12/31/2012 10:40 PM

## 2013-02-15 ENCOUNTER — Ambulatory Visit (INDEPENDENT_AMBULATORY_CARE_PROVIDER_SITE_OTHER): Payer: Medicare Other | Admitting: Pulmonary Disease

## 2013-02-15 ENCOUNTER — Encounter: Payer: Self-pay | Admitting: Pulmonary Disease

## 2013-02-15 VITALS — BP 136/72 | HR 48 | Ht 70.0 in | Wt 171.0 lb

## 2013-02-15 DIAGNOSIS — J449 Chronic obstructive pulmonary disease, unspecified: Secondary | ICD-10-CM

## 2013-02-15 NOTE — Progress Notes (Signed)
Subjective:    Patient ID: [name], male    DOB: 17-Jan-1929, 80 y.o.   MRN: [id number]  Synopsis: former smoker with moderate airflow obstruction due to COPD.  Spirometry in 12/2011 showed clear airflow obstruction and an FEV1 of 2.12 L (67% pred).  HPI  02/15/2013 ROV >> [name] has been using the Spiriva daily and he feles a little better but it hasn't been a dramatic improvement.  He hasn't been as active as he wants to be.  He still has post nasal drip and drainage from an allergic reaction, but not much cough.  No adverse effects from the Spiriva that he can tell.  He is taking it every day.  Past Medical History  Diagnosis Date  . Hyperlipidemia   . Spontaneous pneumothorax 09/04/2010    [name]  . Erectile dysfunction   . AF (atrial fibrillation)     after PTX 2012  . Coronary artery disease   . HSV-1 (herpes simplex virus 1) infection   . Elevated MCV   . Allergy   . GERD (gastroesophageal reflux disease)   . Arthritis   . BPH (benign prostatic hyperplasia)   . Back pain      Review of Systems     Objective:   Physical Exam Filed Vitals:   02/15/13 1124  BP: 136/72  Pulse: 48  Height: _0  (1.778 m)  Weight: 171 lb (77.565 kg)  SpO2: 96%    Gen: well appearing HEENT: NCAT, PERRL PULM: few crackles in bases CV: Irreg irreg, no mgr AB: BS+, soft, nontender Ext: warm, no edema      Assessment & Plan:   COPD I believe that all of [name]'s dyspnea is from COPD.  A 11/2012 CXR questioned fibrosis, but I really don't see that on my review.  He does have changes on chest imaging consistent with his prior pneumothoraces.  I gave him the following instructions today:  For the next four weeks: -Stop Spiriva -Use Anoro one puff daily no matter how you feel -exercise as much as possible every day  Call [name] in four weeks to let [name] know how you are doing -if you are better, we will prescribe Anoro for you to take instead of Spiriva -if you  are not better, we will order pulmonary function testing and a CT scan of your chest and have you resume the Spiriva daily and stop the Anoro  We will see you back in 3 months or sooner if needed    Updated Medication List Outpatient Encounter Prescriptions as of 02/15/2013  Medication Sig  . acetaminophen (TYLENOL) 325 MG tablet Take 650 mg by mouth every 6 (six) hours as needed. For pain  . albuterol (PROAIR HFA) 108 (90 BASE) MCG/ACT inhaler Inhale 2 puffs into the lungs every 4 (four) hours as needed for wheezing or shortness of breath.  . beta carotene w/minerals (OCUVITE) tablet Take 1 tablet by mouth daily.    Marland Kitchen loratadine (CLARITIN) 10 MG tablet Take 10 mg by mouth daily.  Marland Kitchen omeprazole (PRILOSEC) 20 MG capsule Take 20 mg by mouth daily.    . Tamsulosin HCl (FLOMAX) 0.4 MG CAPS Take 1 capsule (0.4 mg total) by mouth daily.  Marland Kitchen tiotropium (SPIRIVA HANDIHALER) 18 MCG inhalation capsule Place 1 capsule (18 mcg total) into inhaler and inhale daily.  . traZODone (DESYREL) 150 MG tablet Take 1 tablet (150 mg total) by mouth at bedtime. For insomnia  . triamcinolone cream (KENALOG) 0.1 % Apply topically  2 (two) times daily.  . valACYclovir (VALTREX) 500 MG tablet Take 500 mg by mouth 2 (two) times daily as needed. For flair ups  . XARELTO 15 MG TABS tablet TAKE 1 TABLET EVERY DAY  . [DISCONTINUED] fluticasone (FLONASE) 50 MCG/ACT nasal spray PLACE 2 SPRAYS INTO THE NOSE DAILY.

## 2013-02-15 NOTE — Patient Instructions (Signed)
For the next four weeks: -Stop Spiriva -Use Anoro one puff daily no matter how you feel -exercise as much as possible every day  Call us in four weeks to let us know how you are doing -if you are better, we will prescribe Anoro for you to take instead of Spiriva -if you are not better, we will order pulmonary function testing and a CT scan of your chest and have you resume the Spiriva daily and stop the Anoro  We will see you back in 3 months or sooner if needed

## 2013-02-15 NOTE — Assessment & Plan Note (Signed)
I believe that all of Lorie's dyspnea is from COPD.  A 11/2012 CXR questioned fibrosis, but I really don't see that on my review.  He does have changes on chest imaging consistent with his prior pneumothoraces.  I gave him the following instructions today:  For the next four weeks: -Stop Spiriva -Use Anoro one puff daily no matter how you feel -exercise as much as possible every day  Call us in four weeks to let us know how you are doing -if you are better, we will prescribe Anoro for you to take instead of Spiriva -if you are not better, we will order pulmonary function testing and a CT scan of your chest and have you resume the Spiriva daily and stop the Anoro  We will see you back in 3 months or sooner if needed

## 2013-02-26 ENCOUNTER — Other Ambulatory Visit: Payer: Self-pay | Admitting: Family Medicine

## 2013-03-15 ENCOUNTER — Telehealth: Payer: Self-pay | Admitting: Pulmonary Disease

## 2013-03-15 MED ORDER — UMECLIDINIUM-VILANTEROL 62.5-25 MCG/INH IN AEPB
1.0000 | INHALATION_SPRAY | Freq: Every day | RESPIRATORY_TRACT | Status: DC
Start: 2013-03-15 — End: 2013-04-16

## 2013-03-15 NOTE — Telephone Encounter (Signed)
Spoke with the pt  He was last seen 02/15/13- Dr [place] Bells rec d/c spiriva and begin anoro  Pt states that he has noticed a minimal improvement in his exercise tolerance, but admits it is hard to tell since he has not been walking every day like he planned on due to weather He is out of sample and wants to know where to go from here Please advise thanks!

## 2013-03-15 NOTE — Telephone Encounter (Signed)
I guess a minimal improvement is better than no improvement, so let's go with Anoro, please Rx for him. thanks

## 2013-03-15 NOTE — Telephone Encounter (Signed)
LMTCB

## 2013-03-15 NOTE — Telephone Encounter (Signed)
Rx for Anoro has been sent in. Pt is aware. Nothing further is needed.

## 2013-04-15 ENCOUNTER — Telehealth: Payer: Self-pay | Admitting: Pulmonary Disease

## 2013-04-15 NOTE — Telephone Encounter (Signed)
Per OV 02/15/13 with BQ: Patient Instructions      For the next four weeks: -Stop Spiriva -Use Anoro one puff daily no matter how you feel -exercise as much as possible every day Call us in four weeks to let us know how you are doing -if you are better, we will prescribe Anoro for you to take instead of Spiriva -if you are not better, we will order pulmonary function testing and a CT scan of your chest and have you resume the Spiriva daily and stop the Anoro We will see you back in 3 months or sooner if needed  ---  Spoke with pt. He reports he has finished Anoro. He can tell his breathing is slightly better and doesn't give out as quick as he used to. He does feel like this helps more than the spiriva did. Wants to know what he should do? Please advise BQ? thanks

## 2013-04-15 NOTE — Telephone Encounter (Signed)
lmomtcb for pt 

## 2013-04-15 NOTE — Telephone Encounter (Signed)
Rx Anoro 1 puff daily Stop Spiriva Please offer an Anoro coupon (from [employer])

## 2013-04-16 MED ORDER — UMECLIDINIUM-VILANTEROL 62.5-25 MCG/INH IN AEPB: 1.0000 | INHALATION_SPRAY | Freq: Every day | RESPIRATORY_TRACT | Status: DC

## 2013-04-16 NOTE — Telephone Encounter (Signed)
rx has been sent and coupon mailed to the pt. Weldon Bing, CMA

## 2013-05-28 ENCOUNTER — Telehealth: Payer: Self-pay | Admitting: Pulmonary Disease

## 2013-05-28 NOTE — Telephone Encounter (Signed)
Called spoke with pt. He reports he can't tell a difference since using anoro. It is about like the spiriva. Since weather has changed he has been trying to increase his activity. He reports he feels like he can take a good deep breathe but just feels short at times. Any other recs for inhalers? Please advise thanks  Allergies  Allergen Reactions  . Blistex     REACTION: lip swelling  . Rosuvastatin     REACTION: myalgias at 13m/day dose

## 2013-05-28 NOTE — Telephone Encounter (Signed)
Spoke with pt and advised of Dr Anastasia Pall recommendations.  Pt verbalized understanding.  Given appt on 06/11/13 at 3:15

## 2013-05-28 NOTE — Telephone Encounter (Signed)
OK to resume Spiriva Albuterol 2 puffs q4h prn dyspnea F/u with [place] in 2-3 weeks if no improvement

## 2013-06-11 ENCOUNTER — Encounter: Payer: Self-pay | Admitting: Pulmonary Disease

## 2013-06-11 ENCOUNTER — Ambulatory Visit (INDEPENDENT_AMBULATORY_CARE_PROVIDER_SITE_OTHER): Payer: Medicare Other | Admitting: Pulmonary Disease

## 2013-06-11 VITALS — BP 124/72 | HR 66 | Ht 70.0 in | Wt 172.0 lb

## 2013-06-11 DIAGNOSIS — J449 Chronic obstructive pulmonary disease, unspecified: Secondary | ICD-10-CM

## 2013-06-11 MED ORDER — TIOTROPIUM BROMIDE MONOHYDRATE 18 MCG IN CAPS: 18.0000 ug | ORAL_CAPSULE | Freq: Every day | RESPIRATORY_TRACT | Status: DC

## 2013-06-11 NOTE — Assessment & Plan Note (Signed)
This has been a stable interval for [facility]. He does not need further testing at this time.  Plan: -Change back to Spriva -Followup 6 months -If worsening dyspnea and consider repeat full pulmonary function test

## 2013-06-11 NOTE — Patient Instructions (Signed)
After completing the Anoro, resume Spiriva once daily Stay active Get a flu shot in the fall WE will see you back in 6 months or sooner if needed

## 2013-06-11 NOTE — Progress Notes (Signed)
Subjective:    Patient ID: [name], male    DOB: May 20, 1928, 80 y.o.   MRN: [id number]  Synopsis: former smoker with moderate airflow obstruction due to COPD.  Spirometry in 12/2011 showed clear airflow obstruction and an FEV1 of 2.12 L (67% pred).  HPI   02/15/2013 ROV >> [name] has been using the Spiriva daily and he feles a little better but it hasn't been a dramatic improvement.  He hasn't been as active as he wants to be.  He still has post nasal drip and drainage from an allergic reaction, but not much cough.  No adverse effects from the Spiriva that he can tell.  He is taking it every day.  Jun 11 2013 routine office visit> This is been a stable interval for [name]. He has gone back to playing golf and he is feeling fairly well with that. He says that his shortness of breath has improved slightly since the last visit. It has not worsened. He started taking Anoro instead of Spiriva and says that really makes no difference. He does not have a cough or chest pain.  Past Medical History  Diagnosis Date  . Hyperlipidemia   . Spontaneous pneumothorax 09/04/2010    [name]  . Erectile dysfunction   . AF (atrial fibrillation)     after PTX 2012  . Coronary artery disease   . HSV-1 (herpes simplex virus 1) infection   . Elevated MCV   . Allergy   . GERD (gastroesophageal reflux disease)   . Arthritis   . BPH (benign prostatic hyperplasia)   . Back pain      Review of Systems      Objective:   Physical Exam  Filed Vitals:   06/11/13 1521  BP: 124/72  Pulse: 66  Height: 5' 10" (1.778 m)  Weight: 172 lb (78.019 kg)  SpO2: 94%    Gen: well appearing HEENT: NCAT, EOMi PULM: CTA B CV: Irreg irreg, no mgr AB: BS+, soft, nontender Ext: warm, no edema      Assessment & Plan:   COPD This has been a stable interval for [name]. He does not need further testing at this time.  Plan: -Change back to Spriva -Followup 6 months -If worsening dyspnea and  consider repeat full pulmonary function test    Updated Medication List Outpatient Encounter Prescriptions as of 06/11/2013  Medication Sig  . acetaminophen (TYLENOL) 325 MG tablet Take 650 mg by mouth every 6 (six) hours as needed. For pain  . albuterol (PROAIR HFA) 108 (90 BASE) MCG/ACT inhaler Inhale 2 puffs into the lungs every 4 (four) hours as needed for wheezing or shortness of breath.  . beta carotene w/minerals (OCUVITE) tablet Take 1 tablet by mouth daily.    Marland Kitchen loratadine (CLARITIN) 10 MG tablet Take 10 mg by mouth daily.  Marland Kitchen omeprazole (PRILOSEC) 20 MG capsule Take 20 mg by mouth daily.    . tamsulosin (FLOMAX) 0.4 MG CAPS capsule TAKE 1 CAPSULE BY MOUTH DAILY  . traZODone (DESYREL) 150 MG tablet Take 1 tablet (150 mg total) by mouth at bedtime. For insomnia  . triamcinolone cream (KENALOG) 0.1 % Apply topically 2 (two) times daily.  Marland Kitchen Umeclidinium-Vilanterol (ANORO ELLIPTA) 62.5-25 MCG/INH AEPB Inhale 1 puff into the lungs daily.  . valACYclovir (VALTREX) 500 MG tablet Take 500 mg by mouth 2 (two) times daily as needed. For flair ups  . XARELTO 15 MG TABS tablet TAKE 1 TABLET EVERY DAY

## 2013-06-15 ENCOUNTER — Other Ambulatory Visit: Payer: Self-pay | Admitting: Family Medicine

## 2013-06-15 DIAGNOSIS — D539 Nutritional anemia, unspecified: Secondary | ICD-10-CM

## 2013-06-15 DIAGNOSIS — E785 Hyperlipidemia, unspecified: Secondary | ICD-10-CM

## 2013-06-18 ENCOUNTER — Other Ambulatory Visit (INDEPENDENT_AMBULATORY_CARE_PROVIDER_SITE_OTHER): Payer: Medicare Other

## 2013-06-18 DIAGNOSIS — D539 Nutritional anemia, unspecified: Secondary | ICD-10-CM

## 2013-06-18 DIAGNOSIS — E785 Hyperlipidemia, unspecified: Secondary | ICD-10-CM

## 2013-06-18 LAB — COMPREHENSIVE METABOLIC PANEL
ALT: 12 U/L (ref 0–53)
AST: 18 U/L (ref 0–37)
Albumin: 4 g/dL (ref 3.5–5.2)
Alkaline Phosphatase: 58 U/L (ref 39–117)
BILIRUBIN TOTAL: 0.9 mg/dL (ref 0.2–1.2)
BUN: 22 mg/dL (ref 6–23)
CHLORIDE: 108 meq/L (ref 96–112)
CO2: 26 mEq/L (ref 19–32)
Calcium: 9.1 mg/dL (ref 8.4–10.5)
Creatinine, Ser: 1.3 mg/dL (ref 0.4–1.5)
GFR: 54.54 mL/min — AB (ref >60.00)
Glucose, Bld: 93 mg/dL (ref 70–99)
POTASSIUM: 3.9 meq/L (ref 3.5–5.1)
SODIUM: 140 meq/L (ref 135–145)
TOTAL PROTEIN: 7.1 g/dL (ref 6.0–8.3)

## 2013-06-18 LAB — CBC WITH DIFFERENTIAL/PLATELET
Basophils Absolute: 0 10*3/uL (ref 0.0–0.1)
Basophils Relative: 0.2 % (ref 0.0–3.0)
EOS PCT: 2 % (ref 0.0–5.0)
Eosinophils Absolute: 0.2 10*3/uL (ref 0.0–0.7)
HEMATOCRIT: 44.3 % (ref 39.0–52.0)
HEMOGLOBIN: 14.8 g/dL (ref 13.0–17.0)
LYMPHS ABS: 1.5 10*3/uL (ref 0.7–4.0)
LYMPHS PCT: 20 % (ref 12.0–46.0)
MCHC: 33.5 g/dL (ref 30.0–36.0)
MCV: 107.3 fl — ABNORMAL HIGH (ref 78.0–100.0)
MONOS PCT: 8.6 % (ref 3.0–12.0)
Monocytes Absolute: 0.7 10*3/uL (ref 0.1–1.0)
Neutro Abs: 5.3 10*3/uL (ref 1.4–7.7)
Neutrophils Relative %: 69.2 % (ref 43.0–77.0)
PLATELETS: 282 10*3/uL (ref 150.0–400.0)
RBC: 4.13 Mil/uL — ABNORMAL LOW (ref 4.22–5.81)
RDW: 14.7 % (ref 11.5–15.5)
WBC: 7.7 10*3/uL (ref 4.0–10.5)

## 2013-06-18 LAB — LIPID PANEL
Cholesterol: 206 mg/dL — ABNORMAL HIGH (ref 0–200)
HDL: 34.7 mg/dL — AB (ref >39.00)
LDL Cholesterol: 137 mg/dL — ABNORMAL HIGH (ref 0–99)
Total CHOL/HDL Ratio: 6
Triglycerides: 173 mg/dL — ABNORMAL HIGH (ref 0.0–149.0)
VLDL: 34.6 mg/dL (ref 0.0–40.0)

## 2013-06-21 ENCOUNTER — Inpatient Hospital Stay (HOSPITAL_COMMUNITY): Payer: Medicare Other

## 2013-06-21 ENCOUNTER — Telehealth: Payer: Self-pay | Admitting: Family Medicine

## 2013-06-21 ENCOUNTER — Inpatient Hospital Stay (HOSPITAL_COMMUNITY)
Admission: EM | Admit: 2013-06-21 | Discharge: 2013-06-25 | DRG: 201 | Disposition: A | Discharge status: Home / Self Care | Payer: Medicare Other | Attending: Thoracic Surgery (Cardiothoracic Vascular Surgery) | Admitting: Thoracic Surgery (Cardiothoracic Vascular Surgery)

## 2013-06-21 ENCOUNTER — Emergency Department (HOSPITAL_COMMUNITY): Payer: Medicare Other

## 2013-06-21 ENCOUNTER — Encounter (HOSPITAL_COMMUNITY): Payer: Self-pay | Admitting: Emergency Medicine

## 2013-06-21 DIAGNOSIS — I4891 Unspecified atrial fibrillation: Secondary | ICD-10-CM | POA: Diagnosis present

## 2013-06-21 DIAGNOSIS — E785 Hyperlipidemia, unspecified: Secondary | ICD-10-CM | POA: Diagnosis present

## 2013-06-21 DIAGNOSIS — Z7901 Long term (current) use of anticoagulants: Secondary | ICD-10-CM

## 2013-06-21 DIAGNOSIS — J4489 Other specified chronic obstructive pulmonary disease: Secondary | ICD-10-CM | POA: Diagnosis present

## 2013-06-21 DIAGNOSIS — J939 Pneumothorax, unspecified: Secondary | ICD-10-CM | POA: Diagnosis present

## 2013-06-21 DIAGNOSIS — Z87891 Personal history of nicotine dependence: Secondary | ICD-10-CM

## 2013-06-21 DIAGNOSIS — J449 Chronic obstructive pulmonary disease, unspecified: Secondary | ICD-10-CM | POA: Diagnosis present

## 2013-06-21 DIAGNOSIS — J9383 Other pneumothorax: Principal | ICD-10-CM | POA: Diagnosis present

## 2013-06-21 DIAGNOSIS — N400 Enlarged prostate without lower urinary tract symptoms: Secondary | ICD-10-CM | POA: Diagnosis present

## 2013-06-21 DIAGNOSIS — I2510 Atherosclerotic heart disease of native coronary artery without angina pectoris: Secondary | ICD-10-CM | POA: Diagnosis present

## 2013-06-21 DIAGNOSIS — J9311 Primary spontaneous pneumothorax: Secondary | ICD-10-CM

## 2013-06-21 DIAGNOSIS — K219 Gastro-esophageal reflux disease without esophagitis: Secondary | ICD-10-CM | POA: Diagnosis present

## 2013-06-21 HISTORY — DX: Chronic obstructive pulmonary disease, unspecified: J44.9

## 2013-06-21 LAB — CBC
HCT: 44.1 % (ref 39.0–52.0)
HEMOGLOBIN: 15.3 g/dL (ref 13.0–17.0)
MCH: 36.3 pg — ABNORMAL HIGH (ref 26.0–34.0)
MCHC: 34.7 g/dL (ref 30.0–36.0)
MCV: 104.8 fL — ABNORMAL HIGH (ref 78.0–100.0)
Platelets: 241 10*3/uL (ref 150–400)
RBC: 4.21 MIL/uL — ABNORMAL LOW (ref 4.22–5.81)
RDW: 14.2 % (ref 11.5–15.5)
WBC: 7.6 10*3/uL (ref 4.0–10.5)

## 2013-06-21 LAB — BASIC METABOLIC PANEL
BUN: 23 mg/dL (ref 6–23)
CHLORIDE: 103 meq/L (ref 96–112)
CO2: 22 meq/L (ref 19–32)
Calcium: 9.2 mg/dL (ref 8.4–10.5)
Creatinine, Ser: 1.35 mg/dL (ref 0.50–1.35)
GFR calc Af Amer: 54 mL/min — ABNORMAL LOW (ref >90)
GFR calc non Af Amer: 47 mL/min — ABNORMAL LOW (ref >90)
GLUCOSE: 109 mg/dL — AB (ref 70–99)
POTASSIUM: 4 meq/L (ref 3.7–5.3)
SODIUM: 140 meq/L (ref 137–147)

## 2013-06-21 LAB — I-STAT TROPONIN, ED: TROPONIN I, POC: 0.02 ng/mL (ref 0.00–0.08)

## 2013-06-21 LAB — PRO B NATRIURETIC PEPTIDE: Pro B Natriuretic peptide (BNP): 157.6 pg/mL (ref 0–450)

## 2013-06-21 MED ORDER — FLUMAZENIL 0.5 MG/5ML IV SOLN
0.5000 mg | Freq: Once | INTRAVENOUS | Status: AC
  Administered 2013-06-21: 0.5 mg via INTRAVENOUS
  Filled 2013-06-21: qty 5

## 2013-06-21 MED ORDER — MORPHINE SULFATE 4 MG/ML IJ SOLN
4.0000 mg | Freq: Once | INTRAMUSCULAR | Status: AC
  Administered 2013-06-21: 4 mg via INTRAVENOUS
  Filled 2013-06-21: qty 1

## 2013-06-21 MED ORDER — TAMSULOSIN HCL 0.4 MG PO CAPS
0.4000 mg | ORAL_CAPSULE | Freq: Every day | ORAL | Status: DC | PRN
  Filled 2013-06-21: qty 1

## 2013-06-21 MED ORDER — ALBUMIN HUMAN 5 % IV SOLN
12.5000 g | Freq: Once | INTRAVENOUS | Status: DC
  Filled 2013-06-21: qty 250

## 2013-06-21 MED ORDER — SODIUM CHLORIDE 0.9 % IJ SOLN
3.0000 mL | Freq: Two times a day (BID) | INTRAMUSCULAR | Status: DC
  Administered 2013-06-22 – 2013-06-25 (×7): 3 mL via INTRAVENOUS

## 2013-06-21 MED ORDER — MIDAZOLAM HCL 2 MG/2ML IJ SOLN
2.0000 mg | Freq: Once | INTRAMUSCULAR | Status: AC
  Administered 2013-06-21: 2 mg via INTRAVENOUS
  Filled 2013-06-21: qty 2

## 2013-06-21 MED ORDER — TRAMADOL HCL 50 MG PO TABS
50.0000 mg | ORAL_TABLET | Freq: Four times a day (QID) | ORAL | Status: DC | PRN
  Administered 2013-06-22 – 2013-06-24 (×9): 50 mg via ORAL
  Filled 2013-06-21 (×9): qty 1

## 2013-06-21 MED ORDER — TIOTROPIUM BROMIDE MONOHYDRATE 18 MCG IN CAPS
18.0000 ug | ORAL_CAPSULE | Freq: Every day | RESPIRATORY_TRACT | Status: DC
  Administered 2013-06-24 – 2013-06-25 (×2): 18 ug via RESPIRATORY_TRACT
  Filled 2013-06-21 (×2): qty 5

## 2013-06-21 MED ORDER — ALBUTEROL SULFATE HFA 108 (90 BASE) MCG/ACT IN AERS: 2.0000 | INHALATION_SPRAY | RESPIRATORY_TRACT | Status: DC | PRN

## 2013-06-21 MED ORDER — OXYCODONE HCL 5 MG PO TABS
5.0000 mg | ORAL_TABLET | ORAL | Status: DC | PRN
  Administered 2013-06-21: 5 mg via ORAL
  Filled 2013-06-21: qty 1

## 2013-06-21 MED ORDER — PANTOPRAZOLE SODIUM 40 MG PO TBEC
40.0000 mg | DELAYED_RELEASE_TABLET | Freq: Every day | ORAL | Status: DC
  Administered 2013-06-22 – 2013-06-25 (×4): 40 mg via ORAL
  Filled 2013-06-21 (×4): qty 1

## 2013-06-21 MED ORDER — MORPHINE SULFATE 2 MG/ML IJ SOLN
2.0000 mg | Freq: Once | INTRAMUSCULAR | Status: AC
  Administered 2013-06-21: 2 mg via INTRAVENOUS
  Filled 2013-06-21: qty 1

## 2013-06-21 MED ORDER — SODIUM CHLORIDE 0.9 % IJ SOLN: 3.0000 mL | INTRAMUSCULAR | Status: DC | PRN

## 2013-06-21 MED ORDER — ALBUTEROL SULFATE (2.5 MG/3ML) 0.083% IN NEBU: 2.5000 mg | INHALATION_SOLUTION | RESPIRATORY_TRACT | Status: DC | PRN

## 2013-06-21 MED ORDER — ATROPINE SULFATE 1 MG/ML IJ SOLN
INTRAMUSCULAR | Status: AC
  Filled 2013-06-21: qty 1

## 2013-06-21 MED ORDER — ALUM & MAG HYDROXIDE-SIMETH 200-200-20 MG/5ML PO SUSP: 30.0000 mL | Freq: Four times a day (QID) | ORAL | Status: DC | PRN

## 2013-06-21 MED ORDER — FLUMAZENIL 0.5 MG/5ML IV SOLN
INTRAVENOUS | Status: AC
  Filled 2013-06-21: qty 5

## 2013-06-21 MED ORDER — ONDANSETRON HCL 4 MG PO TABS: 4.0000 mg | ORAL_TABLET | Freq: Four times a day (QID) | ORAL | Status: DC | PRN

## 2013-06-21 MED ORDER — SENNOSIDES-DOCUSATE SODIUM 8.6-50 MG PO TABS
1.0000 | ORAL_TABLET | Freq: Every evening | ORAL | Status: DC | PRN
  Filled 2013-06-21: qty 1

## 2013-06-21 MED ORDER — SODIUM CHLORIDE 0.9 % IJ SOLN
3.0000 mL | Freq: Two times a day (BID) | INTRAMUSCULAR | Status: DC
  Administered 2013-06-22 – 2013-06-23 (×2): 3 mL via INTRAVENOUS

## 2013-06-21 MED ORDER — UMECLIDINIUM-VILANTEROL 62.5-25 MCG/INH IN AEPB: 1.0000 | INHALATION_SPRAY | Freq: Every day | RESPIRATORY_TRACT | Status: DC

## 2013-06-21 MED ORDER — ONDANSETRON HCL 4 MG/2ML IJ SOLN: 4.0000 mg | Freq: Four times a day (QID) | INTRAMUSCULAR | Status: DC | PRN

## 2013-06-21 MED ORDER — ONDANSETRON HCL 4 MG/2ML IJ SOLN
4.0000 mg | Freq: Once | INTRAMUSCULAR | Status: AC
  Administered 2013-06-21: 4 mg via INTRAVENOUS
  Filled 2013-06-21: qty 2

## 2013-06-21 MED ORDER — DOCUSATE SODIUM 100 MG PO CAPS
100.0000 mg | ORAL_CAPSULE | Freq: Two times a day (BID) | ORAL | Status: DC
  Administered 2013-06-21 – 2013-06-25 (×7): 100 mg via ORAL
  Filled 2013-06-21 (×9): qty 1

## 2013-06-21 MED ORDER — TRAZODONE HCL 100 MG PO TABS
200.0000 mg | ORAL_TABLET | Freq: Every day | ORAL | Status: DC
  Administered 2013-06-21 – 2013-06-24 (×4): 200 mg via ORAL
  Filled 2013-06-21 (×5): qty 2

## 2013-06-21 MED ORDER — ATROPINE SULFATE 1 MG/ML IJ SOLN
0.5000 mg | Freq: Once | INTRAMUSCULAR | Status: AC
  Administered 2013-06-21: 0.5 mg via INTRAVENOUS
  Filled 2013-06-21: qty 1

## 2013-06-21 MED ORDER — SODIUM CHLORIDE 0.9 % IV BOLUS (SEPSIS)
1000.0000 mL | Freq: Once | INTRAVENOUS | Status: AC
  Administered 2013-06-21: 1000 mL via INTRAVENOUS

## 2013-06-21 MED ORDER — LORATADINE 10 MG PO TABS
10.0000 mg | ORAL_TABLET | Freq: Every day | ORAL | Status: DC | PRN
  Filled 2013-06-21: qty 1

## 2013-06-21 MED ORDER — BISACODYL 10 MG RE SUPP: 10.0000 mg | Freq: Every day | RECTAL | Status: DC | PRN

## 2013-06-21 MED ORDER — SODIUM CHLORIDE 0.9 % IV SOLN: 250.0000 mL | INTRAVENOUS | Status: DC | PRN

## 2013-06-21 MED ORDER — ACETAMINOPHEN 650 MG RE SUPP: 650.0000 mg | Freq: Four times a day (QID) | RECTAL | Status: DC | PRN

## 2013-06-21 MED ORDER — ACETAMINOPHEN 325 MG PO TABS: 650.0000 mg | ORAL_TABLET | Freq: Four times a day (QID) | ORAL | Status: DC | PRN

## 2013-06-21 NOTE — ED Notes (Signed)
PA inserted chest tube with Doctor at bedside. Portable chest x-ray taken. Doctor had to reposition chest tube. Second portable chest xray taken. Chest tube in correct position. Doctor, PA, and Nurse at bedside pre, during, and after procedure assessing chest tube and vital signs until vital signs stable.

## 2013-06-21 NOTE — ED Notes (Signed)
Patient placed on oxygen when brought to room.  2liters nasal cannuli.

## 2013-06-21 NOTE — Op Note (Addendum)
CARDIOTHORACIC SURGERY OPERATIVE NOTE  Date of Procedure:  06/21/2013  Preoperative Diagnosis: Right Spontaneous Pneumothorax  Postoperative Diagnosis: Same  Procedure:   Right chest tube placement  Surgeons:   Jadene Pierini, PA-C and Valentina Gu. Roxy Manns, MD  Anesthesia: 1% lidocaine local with intravenous sedation    DETAILS OF THE OPERATIVE PROCEDURE  Following full informed consent the patient was given midazolam 2 mg intravenously and continuously monitored for rhythm, BP and oxygen saturation. The right chest was prepared and draped in a sterile manner. 1% lidocaine was utilized to anesthetize the skin and subcutaneous tissues. A small incision was made and a 28 French straight chest tube was placed through the incision into the pleural space. The tube was secured to the skin and connected to a closed suction collection device. The patient tolerated the procedure well. A portable CXR was obtained.  The initial tube was extrathoracic.  It was repositioned into the pleural space and a second CXR obtained, verifying appropriate position.   Valentina Gu. Roxy Manns, MD 06/21/2013 1:24 PM

## 2013-06-21 NOTE — ED Provider Notes (Signed)
CSN: [id number]     Arrival date & time 06/21/13  0753 History   First MD Initiated Contact with Patient 06/21/13 [phone]     Chief Complaint  Patient presents with  . Chest Pain     (Consider location/radiation/quality/duration/timing/severity/associated sxs/prior Treatment) HPI  80 year old male with history of spontaneous pneumothorax, atrial fibrillation currently on Xarelto, CAD, GERD who presents for evaluations of intermittent chest pain and shortness of breath. Patient reports since yesterday he has been experiencing intermittent pleuritic chest discomfort primarily to his right chest. Symptoms comes and go. It did not bother him too much however this morning he woke up having worsening right-sided chest pleuritic pain and now having some shortness of breath. Shortness of breath is with deep breathing. Symptom is new to him and prompted him to come to ER for further evaluation. Patient otherwise denies fever, chills, productive cough, hemoptysis, lightheadedness, dizziness, exertional chest pain, nausea or vomiting diarrhea, diaphoresis, abdominal pain, back pain, or rash. Patient also mentioned that he normally had a bowel movement every other day but for the past 4 days he has not had a bowel movement. States he's able to pass flatus without difficulty, denies feeling constipated and denies having any abdominal pain. The patient reported spontaneous pneumothorax had been about 3 years ago. Symptoms is vaguely familiar to this episode.  Pt is a former smoker, quit 13 years ago, was a 1pack/year for 50 years.  Past Medical History  Diagnosis Date  . Hyperlipidemia   . Spontaneous pneumothorax 09/04/2010    [name]  . Erectile dysfunction   . AF (atrial fibrillation)     after PTX 2012  . Coronary artery disease   . HSV-1 (herpes simplex virus 1) infection   . Elevated MCV   . Allergy   . GERD (gastroesophageal reflux disease)   . Arthritis   . BPH (benign prostatic hyperplasia)    . Back pain    Past Surgical History  Procedure Laterality Date  . Chest tube placement  09/04/2010  . Video assisted thoracoscopy  12/15/2010    Procedure: VIDEO ASSISTED THORACOSCOPY;  Surgeon: Melrose Nakayama, MD;  Location: [place];  Service: Thoracic;  Laterality: Left;  blebectomy with pleural abrasion   Family History  Problem Relation Age of Onset  . Diabetes Mother   . Prostate cancer Neg Hx   . Colon cancer Brother     possible dx, not certain  . Peripheral Artery Disease Father    History  Substance Use Topics  . Smoking status: Former Smoker -- 1.00 packs/day for 50 years    Types: Cigarettes    Quit date: 04/14/1999  . Smokeless tobacco: Not on file  . Alcohol Use: No     Comment: h/o use    Review of Systems  All other systems reviewed and are negative.     Allergies  Blistex and Rosuvastatin  Home Medications   Prior to Admission medications   Medication Sig Start Date End Date Taking? Authorizing Provider  acetaminophen (TYLENOL) 325 MG tablet Take 650 mg by mouth every 6 (six) hours as needed. For pain    Historical Provider, MD  albuterol (PROAIR HFA) 108 (90 BASE) MCG/ACT inhaler Inhale 2 puffs into the lungs every 4 (four) hours as needed for wheezing or shortness of breath. 12/19/12   Juanito Doom, MD  beta carotene w/minerals (OCUVITE) tablet Take 1 tablet by mouth daily.      Historical Provider, MD  loratadine (CLARITIN) 10 MG tablet Take  10 mg by mouth daily.    Historical Provider, MD  omeprazole (PRILOSEC) 20 MG capsule Take 20 mg by mouth daily.      Historical Provider, MD  tamsulosin (FLOMAX) 0.4 MG CAPS capsule TAKE 1 CAPSULE BY MOUTH DAILY 02/26/13   Tonia Ghent, MD  tiotropium (SPIRIVA) 18 MCG inhalation capsule Place 1 capsule (18 mcg total) into inhaler and inhale daily. 06/11/13   Juanito Doom, MD  traZODone (DESYREL) 150 MG tablet Take 1 tablet (150 mg total) by mouth at bedtime. For insomnia 04/25/11   Tonia Ghent,  MD  triamcinolone cream (KENALOG) 0.1 % Apply topically 2 (two) times daily. 09/13/12   Tonia Ghent, MD  valACYclovir (VALTREX) 500 MG tablet Take 500 mg by mouth 2 (two) times daily as needed. For flair ups    Historical Provider, MD  XARELTO 15 MG TABS tablet TAKE 1 TABLET EVERY DAY 11/04/12   Larey Dresser, MD   BP 174/87  Pulse 74  Temp(Src) 97.5 F (36.4 C) (Oral)  Resp 18  SpO2 94% Physical Exam  Constitutional: He is oriented to person, place, and time. He appears well-developed and well-nourished. No distress.  HENT:  Head: Atraumatic.  Mouth/Throat: Oropharynx is clear and moist.  Eyes: Conjunctivae are normal.  Neck: Normal range of motion. Neck supple. No JVD present.  Cardiovascular: Normal rate and regular rhythm.  Exam reveals no gallop and no friction rub.   No murmur heard. Pulmonary/Chest: Effort normal and breath sounds normal. No respiratory distress. He has no wheezes. He has no rales. He exhibits no tenderness.  Abdominal: Soft. There is no tenderness.  Musculoskeletal: He exhibits no edema.  Neurological: He is alert and oriented to person, place, and time.  Skin: No rash noted.  Psychiatric: He has a normal mood and affect.    ED Course  Procedures (including critical care time)  8:39 AM Patient is here with pleuritic chest pain and shortness of breath. Prior history of spontaneous pneumothorax, will obtain a chest x-ray for further evaluation. Workup initiated.  8:59 AM Radiologist has read the x-ray and notified to me that patient has a right side pneumothorax involving 15-20%  lung volume loss at apical region which will require chest tube for evacuation. No evidence of mediastinal lung shift concerning for tension pneumothorax.  Given the fact that patient is currently on blood thinner medication and has had prior pneumothorax treated by cardiothoracic Dr. Roxan Hockey, I have consult with my attending Dr. and I will also consult with cardiothoracic  specialist for admission and chest tube placement.  9:25 AM I have consulted with CT surgery, who will see pt in ER and will admit for further management.  Pt made aware of plan.  Supplemental O2 given, pain medication provided.  No acute ischemic changes on ECG, trop negative.  Normal BNP.    10:25 AM Dr. Roxy Manns has evaluated pt and request for noncontrast chest CT.  Pt will be admitted for further care.     Labs Review Labs Reviewed  CBC - Abnormal; Notable for the following:    RBC 4.21 (*)    MCV 104.8 (*)    MCH 36.3 (*)    All other components within normal limits  BASIC METABOLIC PANEL - Abnormal; Notable for the following:    Glucose, Bld 109 (*)    GFR calc non Af Amer 47 (*)    GFR calc Af Amer 54 (*)    All other components within normal  limits  PRO B NATRIURETIC PEPTIDE  I-STAT TROPOININ, ED    Imaging Review Dg Chest Portable 1 View  06/21/2013   CLINICAL DATA:  Right chest discomfort.  EXAM: PORTABLE CHEST - 1 VIEW  COMPARISON:  11/26/2012.  FINDINGS: Cardiac enlargement. Left base opacity with pleural effusion, probable scarring versus infiltrate.  Spontaneous right apical pneumothorax up to 15%. No mediastinal shift. Basilar air cyst on the right.  Negative osseous structures.  Atherosclerosis of the aorta.  IMPRESSION: 15% right apical pneumothorax.  No mediastinal shift.  Critical Value/emergent results were called by telephone at the time of interpretation on 06/21/2013 at 8:50 AM to Dr. Domenic Moras , who verbally acknowledged these results.   Electronically Signed   By: Rolla Flatten M.D.   On: 06/21/2013 08:50     EKG Interpretation None      Date: 06/21/2013  Rate: 74  Rhythm: normal sinus rhythm with premature supraventricular complexes  QRS Axis: normal  Intervals: normal  ST/T Wave abnormalities: nonspecific ST changes  Conduction Disutrbances:right bundle branch block  Narrative Interpretation:   Old EKG Reviewed: unchanged    MDM   Final diagnoses:   Spontaneous pneumothorax    BP 161/86  Pulse 70  Temp(Src) 97.5 F (36.4 C) (Oral)  Resp 18  SpO2 96%  I have reviewed nursing notes and vital signs. I personally reviewed the imaging tests through PACS system  I reviewed available ER/hospitalization records thought the EMR     Domenic Moras, [place] 06/21/13 1035

## 2013-06-21 NOTE — H&P (Signed)
History of the present Illness:  The patient is a 80 year old white male with a history of significant previous tobacco abuse and COPD. He has approximately 50 pack year smoking history but did quit 13 years ago. Yesterday evening, he developed right anterior chest discomfort associated with dyspnea. He denies similar symptoms in the past. Upon awakening this morning he noted the symptoms have progressed with worsening pain with less exertion. He tried to do his normal morning activities but became more concerned and awoke his wife who felt he should present to the emergency department. He was found to have a small pneumothorax on the right side by chest x-ray, approximately 15%. He has a previous history of left-sided spontaneous pneumothorax with video-assisted thoracoscopy for bleb resection by Dr. Modesto Charon. He denies fevers, chills or other constitutional symptoms. He does have a chronic mildly productive cough that he primarily associates to allergic symptoms. He has no history of previous myocardial infarction or cardiac catheterization but his history does document coronary artery disease. He has a history of atrial fibrillation and is on is xeralto. He denies palpitations, PND, peripheral edema, or syncope.   Patient Active Problem List   Diagnosis Date Noted  . Skin lesion 09/13/2012  . Exertional shortness of breath 06/03/2012  . Elevated serum creatinine 06/03/2012  . Cerumen impaction 03/15/2012  . Fall at home 02/19/2012  . Constipation 10/12/2011  . Back pain 07/14/2011  . Urinary frequency 07/14/2011  . Medicare annual wellness visit, subsequent 04/25/2011  . Insomnia 04/25/2011  . PAD (peripheral artery disease) 04/15/2011  . Paresthesia of foot 02/15/2011  . ED (erectile dysfunction) 09/28/2010  . PAF (paroxysmal atrial fibrillation) 09/09/2010  . Pneumothorax, left 09/06/2010  . NEOPLASMS UNSPEC NATURE BONE SOFT TISSUE&SKIN 02/10/2010  . MUSCLE CRAMPS 09/01/2009  .  NICOTINE ADDICTION 02/06/2008  . UNSPEC DISORDER CARBOHYDRATE TRANSPORT&METAB 01/29/2007  . G E R D 07/28/2006  . HEMORRHOIDS 07/27/2006  . COPD 07/27/2006  . HYPERTHERMIA, MALIGNANT, FH 07/27/2006  . HX, PERSONAL, ALCOHOLISM 07/27/2006  . HERPES SIMPLEX INFECTION, TYPE I 01/17/2006  . GLUCOSE INTOLERANCE 11/18/2002  . ARTHRITIS, CERVICAL SPINE 12/17/2001  . OSTEOPENIA 12/17/2001  . ALLERGIC RHINITIS 11/17/2001  . DIVERTICULOSIS, COLON 10/18/1998  . DEPRESSION 04/17/1996  . 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 HYPERLIPIDEMIA 10/17/1985   Past Medical History  Diagnosis Date  . Hyperlipidemia   . Spontaneous pneumothorax 09/04/2010    [name]  . Erectile dysfunction   . AF (atrial fibrillation)     after PTX 2012  . Coronary artery disease   . HSV-1 (herpes simplex virus 1) infection   . Elevated MCV   . Allergy   . GERD (gastroesophageal reflux disease)   . Arthritis   . BPH (benign prostatic hyperplasia)   . Back pain   . COPD (chronic obstructive pulmonary disease)     Past Surgical History  Procedure Laterality Date  . Chest tube placement  09/04/2010  . Video assisted thoracoscopy  12/15/2010    Procedure: VIDEO ASSISTED THORACOSCOPY;  Surgeon: Melrose Nakayama, MD;  Location: [place];  Service: Thoracic;  Laterality: Left;  blebectomy with pleural abrasion        Allergies  Allergen Reactions  . Blistex     REACTION: lip swelling  . Rosuvastatin     REACTION: myalgias at 79m/day dose    History  Substance Use Topics  . Smoking status: Former Smoker -- 1.00 packs/day for 50 years    Types: Cigarettes    Quit date: 04/14/1999  .  Smokeless tobacco: Not on file  . Alcohol Use: No     Comment: h/o use    Family History  Problem Relation Age of Onset  . Diabetes Mother   . Prostate cancer Neg Hx   . Colon cancer Brother     possible dx, not certain  . Peripheral Artery Disease Father   Review of Systems Otherwise negative comprehensive review  of sx.   No current facility-administered medications for this encounter. Current outpatient prescriptions:albuterol (PROAIR HFA) 108 (90 BASE) MCG/ACT inhaler, Inhale 2 puffs into the lungs every 4 (four) hours as needed for wheezing or shortness of breath., Disp: 1 Inhaler, Rfl: 2;  beta carotene w/minerals (OCUVITE) tablet, Take 1 tablet by mouth daily.  , Disp: , Rfl: ;  docusate sodium (COLACE) 100 MG capsule, Take 100 mg by mouth daily., Disp: , Rfl:  loratadine (CLARITIN) 10 MG tablet, Take 10 mg by mouth daily as needed for allergies. , Disp: , Rfl: ;  omeprazole (PRILOSEC) 20 MG capsule, Take 20 mg by mouth daily.  , Disp: , Rfl: ;  Rivaroxaban (XARELTO) 15 MG TABS tablet, Take 15 mg by mouth every morning., Disp: , Rfl: ;  tamsulosin (FLOMAX) 0.4 MG CAPS capsule, Take 0.4 mg by mouth daily as needed (frequemcy)., Disp: , Rfl:  tiotropium (SPIRIVA) 18 MCG inhalation capsule, Place 1 capsule (18 mcg total) into inhaler and inhale daily., Disp: 30 capsule, Rfl: 6;  traZODone (DESYREL) 100 MG tablet, Take 200 mg by mouth at bedtime., Disp: , Rfl: ;  Umeclidinium-Vilanterol (ANORO ELLIPTA) 62.5-25 MCG/INH AEPB, Inhale 1 puff into the lungs daily., Disp: , Rfl:  Objective: Vital signs: Temp:  [97.5 F (36.4 C)] 97.5 F (36.4 C) (06/05 0758) Pulse Rate:  [67-74] 69 (06/05 0943) Resp:  [18-24] 18 (06/05 0943) BP: (140-174)/(68-87) 167/79 mmHg (06/05 0943) SpO2:  [91 %-96 %] 92 % (06/05 0943)  BP 167/79  Pulse 69  Temp(Src) 97.5 F (36.4 C) (Oral)  Resp 18  SpO2 92%  General Appearance:    Alert, cooperative, no distress, appears stated age  Head:    Normocephalic, without obvious abnormality, atraumatic  Eyes:    PERRL, conjunctiva/corneas clear, EOM's intact, fundi    benign, both eyes       Ears:    Tm's obscured by cerumen and external ear canals, both ears  Nose:   Nares normal, septum midline, mucosa normal, no drainage    or sinus tenderness  Throat:   Lips, mucosa, and tongue  normal; teeth and gums normal  Neck:   Supple, symmetrical, trachea midline, no adenopathy;       thyroid:  No enlargement/tenderness/nodules; no carotid   bruit or JVD  Back:     Symmetric, no curvature, ROM normal, no CVA tenderness  Lungs:     Clear to auscultation bilaterally, respirations unlabored, slightly dim on right side  Chest wall:    No tenderness or deformity  Heart:    Regular rate and rhythm, S1 and S2 normal, no murmur, rub   or gallop  Abdomen:     Soft, non-tender, bowel sounds active all four quadrants,    no masses, no organomegaly        Extremities:   Extremities normal, atraumatic, no cyanosis or edema  Pulses:   2+ and symmetric all extremities  Skin:   Skin color, texture, turgor normal, no rashes or lesions  Lymph nodes:   Cervical, supraclavicular, and axillary nodes normal  Neurologic: Intact grossly  Chest X-Ray: right apical approx 15% pneumothorax Data Review: Results for orders placed during the hospital encounter of 06/21/13 (from the past 24 hour(s))  CBC     Status: Abnormal   Collection Time    06/21/13  8:08 AM      Result Value Ref Range   WBC 7.6  4.0 - 10.5 K/uL   RBC 4.21 (*) 4.22 - 5.81 MIL/uL   Hemoglobin 15.3  13.0 - 17.0 g/dL   HCT 44.1  39.0 - 52.0 %   MCV 104.8 (*) 78.0 - 100.0 fL   MCH 36.3 (*) 26.0 - 34.0 pg   MCHC 34.7  30.0 - 36.0 g/dL   RDW 14.2  11.5 - 15.5 %   Platelets 241  150 - 400 K/uL  BASIC METABOLIC PANEL     Status: Abnormal   Collection Time    06/21/13  8:08 AM      Result Value Ref Range   Sodium 140  137 - 147 mEq/L   Potassium 4.0  3.7 - 5.3 mEq/L   Chloride 103  96 - 112 mEq/L   CO2 22  19 - 32 mEq/L   Glucose, Bld 109 (*) 70 - 99 mg/dL   BUN 23  6 - 23 mg/dL   Creatinine, Ser 1.35  0.50 - 1.35 mg/dL   Calcium 9.2  8.4 - 10.5 mg/dL   GFR calc non Af Amer 47 (*) >90 mL/min   GFR calc Af Amer 54 (*) >90 mL/min  PRO B NATRIURETIC PEPTIDE     Status: None   Collection Time    06/21/13  8:08 AM       Result Value Ref Range   Pro B Natriuretic peptide (BNP) 157.6  0 - 450 pg/mL  I-STAT TROPOININ, ED     Status: None   Collection Time    06/21/13  8:29 AM      Result Value Ref Range   Troponin i, poc 0.02  0.00 - 0.08 ng/mL   Comment 3            Assessment/Plan: Right-sided spontaneous pneumothorax   We'll obtain a CT scan of the chest. He will be admitted for further observation as may need future placement of chest tube.   Admit to telemetry bed   I have seen and examined the patient and agree with the assessment and plan as outlined.  Patient has h/o left spontaneous PTX in the past for which he underwent VATS.  He now presents with first time right spontaneous PTX.  The lung is approx 15% collapsed, and the patient is only mildly symptomatic.  CXR suggests that the lung may be partially stuck.  Will hold Xarelto and get chest CT to better characterize whether or not there may be pleural adhesions.  Admit for observation and place chest tube if PTX gets larger or patient becomes more short of breath.  Rexene Alberts 06/21/2013 10:57 AM

## 2013-06-21 NOTE — ED Notes (Signed)
Pt reports that he has been having intermittent chest pain that started yesterday. States that he woke up this morning and felt SOB. Denies any n/v. Skin warm and dry. Reports that he is on Xeratlo.

## 2013-06-21 NOTE — ED Notes (Signed)
PA at bedside. Dr Roxy Manns will come to ED for chest tube insertion.

## 2013-06-21 NOTE — Telephone Encounter (Signed)
FYI - Pt's wife called to cancel CPE for 06/24/13.  Pt is being admitted to [facility] hospital.  She states it is for collapsed lung. They will call back to reschedule.  / lt

## 2013-06-21 NOTE — ED Notes (Signed)
Spoke with ED PA and stated to page Dr. Roxy Manns with CT results. Firefighter.

## 2013-06-21 NOTE — Progress Notes (Signed)
Utilization review completed. Rozanna Boer, RN, BSN.

## 2013-06-21 NOTE — ED Provider Notes (Signed)
Medical screening examination/treatment/procedure(s) were performed by non-physician practitioner and as supervising physician I was immediately available for consultation/collaboration.   EKG Interpretation   Date/Time:  Friday June 21 2013 07:58:50 EDT Ventricular Rate:  74 PR Interval:  196 QRS Duration: 134 QT Interval:  404 QTC Calculation: 448 R Axis:   98 Text Interpretation:  Sinus rhythm with Premature supraventricular  complexes Right bundle branch block Abnormal ECG Confirmed by Beau Fanny  MD,  Kannan Proia ([id number]) on 06/21/2013 10:23:09 AM       Veryl Speak, MD 06/21/13 1600

## 2013-06-21 NOTE — ED Notes (Signed)
Provider from Dr Darcey Nora called notified of test result from ED PA.  Currently results not finalized in EPIC at this time. Will call ED nurse back with plan.

## 2013-06-22 ENCOUNTER — Inpatient Hospital Stay (HOSPITAL_COMMUNITY): Payer: Medicare Other

## 2013-06-22 LAB — BASIC METABOLIC PANEL
BUN: 18 mg/dL (ref 6–23)
CALCIUM: 8.8 mg/dL (ref 8.4–10.5)
CO2: 23 meq/L (ref 19–32)
Chloride: 106 mEq/L (ref 96–112)
Creatinine, Ser: 1.1 mg/dL (ref 0.50–1.35)
GFR calc Af Amer: 70 mL/min — ABNORMAL LOW (ref >90)
GFR calc non Af Amer: 60 mL/min — ABNORMAL LOW (ref >90)
Glucose, Bld: 121 mg/dL — ABNORMAL HIGH (ref 70–99)
POTASSIUM: 4.2 meq/L (ref 3.7–5.3)
SODIUM: 141 meq/L (ref 137–147)

## 2013-06-22 LAB — CBC
HCT: 40.9 % (ref 39.0–52.0)
Hemoglobin: 14 g/dL (ref 13.0–17.0)
MCH: 35.6 pg — ABNORMAL HIGH (ref 26.0–34.0)
MCHC: 34.2 g/dL (ref 30.0–36.0)
MCV: 104.1 fL — AB (ref 78.0–100.0)
PLATELETS: 237 10*3/uL (ref 150–400)
RBC: 3.93 MIL/uL — AB (ref 4.22–5.81)
RDW: 14 % (ref 11.5–15.5)
WBC: 11.7 10*3/uL — AB (ref 4.0–10.5)

## 2013-06-22 MED ORDER — TIOTROPIUM BROMIDE MONOHYDRATE 18 MCG IN CAPS
18.0000 ug | ORAL_CAPSULE | Freq: Every day | RESPIRATORY_TRACT | Status: DC
  Administered 2013-06-23: 18 ug via RESPIRATORY_TRACT
  Filled 2013-06-22: qty 5

## 2013-06-22 NOTE — Progress Notes (Addendum)
[address]       ,[place] [address]             [phone]               Subjective: Comfortable, no complaints.   Objective: Vital signs in last 24 hours: Patient Vitals for the past 24 hrs:  BP Temp Temp src Pulse Resp SpO2 Height Weight  06/22/13 0426 150/73 mmHg 98.4 F (36.9 C) Oral 76 18 93 % - 160 lb 15 oz (73 kg)  06/21/13 2104 132/68 mmHg 98.8 F (37.1 C) Oral 83 20 92 % - -  06/21/13 1430 120/63 mmHg 98.5 F (36.9 C) Oral 68 18 100 % - -  06/21/13 1415 120/63 mmHg - - 66 18 100 % - -  06/21/13 1412 125/64 mmHg - - 70 18 100 % - -  06/21/13 1401 118/59 mmHg - - 69 20 100 % 5' 10.08" (1.78 m) 171 lb 15.3 oz (78 kg)  06/21/13 1400 104/55 mmHg - - 69 20 98 % - -  06/21/13 1345 101/59 mmHg - - 66 18 99 % - -  06/21/13 1343 94/61 mmHg - - 66 18 100 % - -  06/21/13 1340 91/54 mmHg - - 78 16 100 % - -  06/21/13 1335 64/48 mmHg - - 34 17 100 % - -  06/21/13 1325 69/43 mmHg - - 34 22 100 % - -  06/21/13 1315 66/45 mmHg - - 51 20 100 % - -  06/21/13 1310 54/43 mmHg - - 58 15 98 % - -  06/21/13 1308 61/47 mmHg - - 41 22 97 % - -  06/21/13 1305 66/41 mmHg - - 42 21 98 % - -  06/21/13 1245 173/92 mmHg - - 79 21 95 % - -  06/21/13 1230 155/82 mmHg - - 80 19 95 % - -  06/21/13 1215 151/79 mmHg - - 73 19 96 % - -  06/21/13 1207 159/80 mmHg - - 69 14 96 % - -  06/21/13 1130 147/83 mmHg - - 67 17 97 % - -  06/21/13 1128 159/86 mmHg - - 72 - 97 % - -  06/21/13 1115 159/86 mmHg - - 70 26 93 % - -  06/21/13 1045 155/87 mmHg - - 75 17 91 % - -  06/21/13 1030 160/83 mmHg - - 68 22 94 % - -   Current Weight  06/22/13 160 lb 15 oz (73 kg)     Intake/Output from previous day: 06/05 0701 - 06/06 0700 In: -  Out: 410 [Urine:400; Chest Tube:10]    PHYSICAL EXAM:  Heart: RRR Lungs: Clear Chest tube: No air leak    Lab Results: CBC: Recent Labs  06/21/13 0808 06/22/13 0424  WBC 7.6 11.7*  HGB 15.3 14.0  HCT 44.1 40.9  PLT 241 237    BMET:  Recent Labs  06/21/13 0808 06/22/13 0424  NA 140 141  K 4.0 4.2  CL 103 106  CO2 22 23  GLUCOSE 109* 121*  BUN 23 18  CREATININE 1.35 1.10  CALCIUM 9.2 8.8    PT/INR: No results found for this basename: LABPROT, INR,  in the last 72 hours  CXR: FINDINGS:  Small right apical pneumothorax. Indwelling right chest tube.  Mild patchy left lower lobe opacity, likely atelectasis/scarring.  Postsurgical changes in the left mid lung. Underlying chronic  interstitial markings/emphysematous changes.  The heart is  normal in size.  IMPRESSION:  Small right apical pneumothorax. Indwelling right chest tube.    Assessment/Plan: S/P  R CT for spontaneous ptx-  CT currently on water seal after returning from CXR, no air leak with cough. Will leave off suction and watch.   LOS: 1 day    Coolidge Breeze 06/22/2013  Patient examined and chest x-ray viewed Leave chest tube to waterseal Repeat x-ray in a.m.

## 2013-06-23 ENCOUNTER — Inpatient Hospital Stay (HOSPITAL_COMMUNITY): Payer: Medicare Other

## 2013-06-23 NOTE — Progress Notes (Signed)
06/23/2013 2:13 PM Nursing note Pt. And wife ambulated independently around unit 350 ft. Pt. Tolerated well. Will continue to monitor patient.  [place]

## 2013-06-23 NOTE — Progress Notes (Addendum)
[address]       [place] [address]             [phone]               Subjective: Comfortable, no complaints.   Objective: Vital signs in last 24 hours: Patient Vitals for the past 24 hrs:  BP Temp Temp src Pulse Resp SpO2 Weight  06/23/13 0918 - - - - - 92 % -  06/23/13 0502 139/83 mmHg 98.1 F (36.7 C) Oral 69 18 95 % 163 lb 12.8 oz (74.3 kg)  06/22/13 2130 145/59 mmHg 97.5 F (36.4 C) Oral 51 18 95 % -  06/22/13 1502 152/70 mmHg 98.6 F (37 C) Oral 78 18 95 % -   Current Weight  06/23/13 163 lb 12.8 oz (74.3 kg)     Intake/Output from previous day: 06/06 0701 - 06/07 0700 In: 480 [P.O.:480] Out: 750 [Urine:750]    PHYSICAL EXAM:  Heart: RRR Lungs: Diminished BS R base Wound: Clean and dry Chest tube: No air leak    Lab Results: CBC: Recent Labs  06/21/13 0808 06/22/13 0424  WBC 7.6 11.7*  HGB 15.3 14.0  HCT 44.1 40.9  PLT 241 237   BMET:  Recent Labs  06/21/13 0808 06/22/13 0424  NA 140 141  K 4.0 4.2  CL 103 106  CO2 22 23  GLUCOSE 109* 121*  BUN 23 18  CREATININE 1.35 1.10  CALCIUM 9.2 8.8    PT/INR: No results found for this basename: LABPROT, INR,  in the last 72 hours  CXR: FINDINGS:  The right chest tube remains in place. The pneumothorax is slightly  smaller it measures a maximum of 16 mm and is now 14 mm. Chronic  underlying lung disease with emphysema and pulmonary scarring.  Chronic left basilar scarring changes.  IMPRESSION:  Stable right-sided chest tube with slightly smaller pneumothorax.  Chronic emphysematous changes with pulmonary scarring.    Assessment/Plan: S/P  right CT for spontaneous ptx- Continue CT to water seal for now.  Hopefully can d/c CT soon. CXR with continued improvement and no air leak on exam.   LOS: 2 days    Coolidge Breeze 06/23/2013  Observe on water seal an- other day and check x-ray in a.m. before D.C chest tube patient examined and medical record  reviewed,agree with above note. Tharon Aquas Trigt 06/23/2013

## 2013-06-23 NOTE — Progress Notes (Signed)
06/23/2013 4:10 PM Pt provided incentive spirometer and instructed on use using teach back method. Pt. Performed return demonstration on correct use.  [place]

## 2013-06-23 NOTE — Discharge Summary (Signed)
[address]       [place] [address]             [phone]              Discharge Summary  Name: [name] [facility] DOB: 07/25/1928 80 y.o. MRN: [id number]   Admission Date: 06/21/2013 Discharge Date: 06/25/2013    Admitting Diagnosis: Spontaneous right pneumothorax   Discharge Diagnosis:  Spontaneous right pneumothorax  Past Medical History  Diagnosis Date  . Hyperlipidemia   . Spontaneous pneumothorax 09/04/2010    [name]  . Erectile dysfunction   . AF (atrial fibrillation)     after PTX 2012  . Coronary artery disease   . HSV-1 (herpes simplex virus 1) infection   . Elevated MCV   . Allergy   . GERD (gastroesophageal reflux disease)   . Arthritis   . BPH (benign prostatic hyperplasia)   . Back pain   . COPD (chronic obstructive pulmonary disease)      Procedures: Right chest tube placement - 06/21/2013    HPI:  The patient is a 80 y.o. male with a history of significant previous tobacco abuse and COPD. He has approximately 50 pack year smoking history but did quit 13 years ago. Yesterday evening, he developed right anterior chest discomfort associated with dyspnea. He denies similar symptoms in the past. Upon awakening this morning he noted the symptoms have progressed with worsening pain with less exertion. He tried to do his normal morning activities but became more concerned and awoke his wife who felt he should present to the emergency department. He was found to have a small pneumothorax on the right side by chest x-ray, approximately 15%. He has a previous history of left-sided spontaneous pneumothorax with video-assisted thoracoscopy for bleb resection by Dr. Modesto Charon. He denies fevers, chills or other constitutional symptoms. He does have a chronic mildly productive cough that he primarily associates to allergic symptoms. He has no history of previous myocardial infarction or cardiac catheterization but his history  does document coronary artery disease. He has a history of atrial fibrillation and is on is xeralto. He denies palpitations, PND, peripheral edema, or syncope. Dr. Roxy Manns saw the patient for thoracic surgery evaluation.  A CT of the chest was performed which showed a greater than 50% right pneumothorax.  A chest tube was placed and the patient was admitted for further management.    Hospital Course:  The patient was admitted to [facility] on 06/21/2013. His chest tube was placed to suction and the pneumothorax resolved on chest x-ray.  The tube was weaned to water seal, then was ultimately discontinued.  Follow up chest x-rays have remained stable.  The patient has otherwise remained stable during this admission. He has been evaluated on today's date and is medically stable for discharge home.      Recent vital signs:  Filed Vitals:   06/23/13 0502  BP: 139/83  Pulse: 69  Temp: 98.1 F (36.7 C)  Resp: 18    Recent laboratory studies:  CBC: Recent Labs  06/21/13 0808 06/22/13 0424  WBC 7.6 11.7*  HGB 15.3 14.0  HCT 44.1 40.9  PLT 241 237   BMET:  Recent Labs  06/21/13 0808 06/22/13 0424  NA 140 141  K 4.0 4.2  CL 103 106  CO2 22 23  GLUCOSE 109* 121*  BUN 23 18  CREATININE 1.35 1.10  CALCIUM 9.2 8.8    PT/INR: No results found  for this basename: LABPROT, INR,  in the last 72 hours   Discharge Medications:     Medication List         albuterol 108 (90 BASE) MCG/ACT inhaler  Commonly known as:  PROAIR HFA  Inhale 2 puffs into the lungs every 4 (four) hours as needed for wheezing or shortness of breath.     beta carotene w/minerals tablet  Take 1 tablet by mouth daily.     docusate sodium 100 MG capsule  Commonly known as:  COLACE  Take 100 mg by mouth daily.     loratadine 10 MG tablet  Commonly known as:  CLARITIN  Take 10 mg by mouth daily as needed for allergies.     omeprazole 20 MG capsule  Commonly known as:  PRILOSEC  Take 20 mg by mouth daily.      Rivaroxaban 15 MG Tabs tablet  Commonly known as:  XARELTO  Take 15 mg by mouth every morning.     tamsulosin 0.4 MG Caps capsule  Commonly known as:  FLOMAX  Take 0.4 mg by mouth daily as needed (frequemcy).     tiotropium 18 MCG inhalation capsule  Commonly known as:  SPIRIVA  Place 1 capsule (18 mcg total) into inhaler and inhale daily.     traMADol 50 MG tablet  Commonly known as:  ULTRAM  Take 1 tablet (50 mg total) by mouth every 6 (six) hours as needed for moderate pain.     traZODone 100 MG tablet  Commonly known as:  DESYREL  Take 200 mg by mouth at bedtime.         Discharge Instructions:  The patient is to refrain from driving, heavy lifting or strenuous activity.  May shower daily and clean incisions with soap and water.  May resume regular diet.   Follow Up: Follow-up Information   Follow up with Rexene Alberts, MD. (Office will contact you with an appointment)    Specialty:  Cardiothoracic Surgery   Contact information:   [place] [address] [phone]       Follow up with [facility] In 1 week. (For suture removal - office will call to schedule appointment)            Coolidge Breeze 06/23/2013, 10:13 AM

## 2013-06-23 NOTE — Discharge Instructions (Signed)
No driving, heavy lifting or strenuous activity. You may resume your regular diet. You may shower and clean incisions daily with soap and water. Call the office [phone]) if you experience increased chest pain or shortness of breath, redness, swelling, or drainage from your incision site, or temperature greater than 101.

## 2013-06-24 ENCOUNTER — Inpatient Hospital Stay (HOSPITAL_COMMUNITY): Payer: Medicare Other

## 2013-06-24 ENCOUNTER — Encounter: Payer: Medicare Other | Admitting: Family Medicine

## 2013-06-24 MED ORDER — TRAMADOL HCL 50 MG PO TABS: 50.0000 mg | ORAL_TABLET | Freq: Four times a day (QID) | ORAL | Status: DC | PRN

## 2013-06-24 NOTE — Progress Notes (Addendum)
[address]       [place] [address]             [phone]          Subjective: He feels well  Objective: Vital signs in last 24 hours: Temp:  [98.2 F (36.8 C)-98.6 F (37 C)] 98.6 F (37 C) (06/08 0422) Pulse Rate:  [64-73] 73 (06/08 0422) Cardiac Rhythm:  [-] Normal sinus rhythm;Sinus bradycardia (06/07 1930) Resp:  [17-19] 17 (06/08 0422) BP: (132-148)/(72-82) 132/75 mmHg (06/08 0422) SpO2:  [92 %-94 %] 92 % (06/08 0422) Weight:  [162 lb 7.7 oz (73.7 kg)] 162 lb 7.7 oz (73.7 kg) (06/08 0422)  Hemodynamic parameters for last 24 hours:    Intake/Output from previous day: 06/07 0701 - 06/08 0700 In: 240 [P.O.:240] Out: 925 [Urine:875; Chest Tube:50] Intake/Output this shift: Total I/O In: 200 [P.O.:200] Out: -   General appearance: alert, cooperative and no distress Heart: regular rate and rhythm Lungs: clear to auscultation bilaterally  Lab Results:  Recent Labs  06/22/13 0424  WBC 11.7*  HGB 14.0  HCT 40.9  PLT 237   BMET:  Recent Labs  06/22/13 0424  NA 141  K 4.2  CL 106  CO2 23  GLUCOSE 121*  BUN 18  CREATININE 1.10  CALCIUM 8.8    PT/INR: No results found for this basename: LABPROT, INR,  in the last 72 hours ABG    Component Value Date/Time   PHART 7.413 12/16/2010 0500   HCO3 24.8* 12/16/2010 0500   TCO2 26.0 12/16/2010 0500   ACIDBASEDEF 1.2 12/14/2010 1558   O2SAT 91.6 12/16/2010 0500   CBG (last 3)  No results found for this basename: GLUCAP,  in the last 72 hours Scheduled Meds: . docusate sodium  100 mg Oral BID  . pantoprazole  40 mg Oral Daily  . sodium chloride  3 mL Intravenous Q12H  . sodium chloride  3 mL Intravenous Q12H  . tiotropium  18 mcg Inhalation Daily  . traZODone  200 mg Oral QHS   Continuous Infusions:  PRN Meds:.sodium chloride, acetaminophen, acetaminophen, albuterol, alum & mag hydroxide-simeth, bisacodyl, loratadine, ondansetron (ZOFRAN) IV, ondansetron, senna-docusate,  sodium chloride, tamsulosin, traMADol  Dg Chest 2 View  06/23/2013   CLINICAL DATA:  Followup pneumothorax.  EXAM: CHEST  2 VIEW  COMPARISON:  06/22/2013.  FINDINGS: The right chest tube remains in place. The pneumothorax is slightly smaller it measures a maximum of 16 mm and is now 14 mm. Chronic underlying lung disease with emphysema and pulmonary scarring. Chronic left basilar scarring changes.  IMPRESSION: Stable right-sided chest tube with slightly smaller pneumothorax.  Chronic emphysematous changes with pulmonary scarring.   Electronically Signed   By: Kalman Jewels M.D.   On: 06/23/2013 06:45   Dg Chest Port 1 View  06/24/2013   CLINICAL DATA:  Evaluate right pneumothorax.  EXAM: PORTABLE CHEST - 1 VIEW  COMPARISON:  06/23/2013  FINDINGS: Patient has a small right apical pneumothorax which is unchanged in size. However, the right chest tube has been pulled back and only a small portion appears to be within the chest. Again noted are underlying emphysematous changes. Patchy densities at the left lung base are consistent with chronic changes based on the exam from 06/01/2012. Heart and mediastinum are stable. No evidence for a mediastinal shift.  IMPRESSION: The small right apical pneumothorax is stable in size.  The right chest tube has been pulled back.  These results  were called by telephone at the time of interpretation on 06/24/2013 at 7:49 AM to the patient's nurse, Katharine Look, who verbally acknowledged these results.   Electronically Signed   By: Markus Daft M.D.   On: 06/24/2013 07:54   Dg Chest Port 1 View  06/22/2013   CLINICAL DATA:  Follow-up pneumothorax  EXAM: PORTABLE CHEST - 1 VIEW  COMPARISON:  06/21/2013  FINDINGS: Small right apical pneumothorax.  Indwelling right chest tube.  Mild patchy left lower lobe opacity, likely atelectasis/scarring. Postsurgical changes in the left mid lung. Underlying chronic interstitial markings/emphysematous changes.  The heart is normal in size.  IMPRESSION:  Small right apical pneumothorax.  Indwelling right chest tube.   Electronically Signed   By: Julian Hy M.D.   On: 06/22/2013 09:21   Assessment/Plan: 1 S/P right CT for spont pntx. Very small apical pntx ant CT is mostly out of chest. Poss d/c- no evid of air leak.  2 wean O2 off 3 poss home later today if all agree  LOS: 3 days    John Giovanni 06/24/2013  I have seen and examined the patient and agree with the assessment and plan as outlined.  D/C chest tube today.  Rexene Alberts 06/24/2013 9:23 AM

## 2013-06-24 NOTE — Telephone Encounter (Signed)
Called and talked to patient.  He is recovering, feeling better.  He needed a copy of his CPE labs.  If they can't print them at [facility], we can send them to him. He'll check with staff at the hospital.  He thanked me for the call.

## 2013-06-25 ENCOUNTER — Inpatient Hospital Stay (HOSPITAL_COMMUNITY): Payer: Medicare Other

## 2013-06-25 NOTE — Care Management Note (Signed)
    Page 1 of 1   06/25/2013     2:07:12 PM CARE MANAGEMENT NOTE 06/25/2013  Patient:  [name], [name]   Account Number:  [id number]  Date Initiated:  06/25/2013  Documentation initiated by:  Jakyra Kenealy  Subjective/Objective Assessment:   Pt adm on 06/21/13 with spontaneous PTX.  PTA, pt independent, lives with spouse.     Action/Plan:   Pt for dc home today with spouse.  Denies any needs for home.   Anticipated DC Date:  06/25/2013   Anticipated DC Plan:  [place]  CM consult      Choice offered to / List presented to:             Status of service:  Completed, signed off Medicare Important Message given?  YES (If response is "NO", the following Medicare IM given date fields will be blank) Date Medicare IM given:   Date Additional Medicare IM given:  06/24/2013  Discharge Disposition:  HOME/SELF CARE  Per UR Regulation:  Reviewed for med. necessity/level of care/duration of stay  If discussed at [facility] of Stay Meetings, dates discussed:    Comments:

## 2013-06-25 NOTE — Progress Notes (Signed)
  Subjective: Feels well, no SOB. Chest Xray has not been done yet  Objective: Vital signs in last 24 hours: Temp:  [97.5 F (36.4 C)-98 F (36.7 C)] 97.5 F (36.4 C) (06/09 0704) Pulse Rate:  [63-77] 65 (06/09 0704) Cardiac Rhythm:  [-] Normal sinus rhythm (06/08 2100) Resp:  [18] 18 (06/08 2018) BP: (113-126)/(65-73) 113/67 mmHg (06/09 0704) SpO2:  [91 %-94 %] 92 % (06/09 0704)  Hemodynamic parameters for last 24 hours:    Intake/Output from previous day: 06/08 0701 - 06/09 0700 In: 200 [P.O.:200] Out: -  Intake/Output this shift:    General appearance: alert, cooperative and no distress Heart: regular rate and rhythm Lungs: clear to auscultation bilaterally  Lab Results: No results found for this basename: WBC, HGB, HCT, PLT,  in the last 72 hours BMET: No results found for this basename: NA, K, CL, CO2, GLUCOSE, BUN, CREATININE, CALCIUM,  in the last 72 hours  PT/INR: No results found for this basename: LABPROT, INR,  in the last 72 hours ABG    Component Value Date/Time   PHART 7.413 12/16/2010 0500   HCO3 24.8* 12/16/2010 0500   TCO2 26.0 12/16/2010 0500   ACIDBASEDEF 1.2 12/14/2010 1558   O2SAT 91.6 12/16/2010 0500   CBG (last 3)  No results found for this basename: GLUCAP,  in the last 72 hours  Assessment/Plan: S/P spont right pntx Await results of todays CXR. Poss d/c later today   LOS: 4 days    John Giovanni 06/25/2013

## 2013-06-25 NOTE — Progress Notes (Signed)
Nursing note Patient given discharge instructions medication list, paper prescription and AVS. Will discharge home as ordered. Bettina Gavia Marwin Primmer RN

## 2013-06-26 ENCOUNTER — Telehealth: Payer: Self-pay | Admitting: Family Medicine

## 2013-06-26 NOTE — Telephone Encounter (Signed)
Labs are printed, please send to patient.  His labs are fine.  His TGs were up a little but not sig different from prev.  O/w his labs were good.  Thanks.

## 2013-06-26 NOTE — Telephone Encounter (Signed)
Left message on voicemail for pt to return call

## 2013-06-26 NOTE — Telephone Encounter (Signed)
Patient called and asked for a copy of his lab work. He tried to get a copy from the [facility] and they said he'd have to sign a release.  Patient wanted to know if you had any comments you wanted to add to the lab.  Patient wants copy of lab work mailed to him.

## 2013-06-27 ENCOUNTER — Inpatient Hospital Stay (HOSPITAL_COMMUNITY)
Admission: EM | Admit: 2013-06-27 | Discharge: 2013-07-03 | DRG: 164 | Disposition: A | Discharge status: Home Health | Payer: Medicare Other | Attending: Thoracic Surgery (Cardiothoracic Vascular Surgery) | Admitting: Thoracic Surgery (Cardiothoracic Vascular Surgery)

## 2013-06-27 ENCOUNTER — Inpatient Hospital Stay (HOSPITAL_COMMUNITY): Payer: Medicare Other

## 2013-06-27 ENCOUNTER — Emergency Department (HOSPITAL_COMMUNITY): Payer: Medicare Other

## 2013-06-27 ENCOUNTER — Encounter (HOSPITAL_COMMUNITY): Payer: Self-pay | Admitting: Emergency Medicine

## 2013-06-27 ENCOUNTER — Encounter: Payer: Self-pay | Admitting: *Deleted

## 2013-06-27 DIAGNOSIS — J9383 Other pneumothorax: Secondary | ICD-10-CM

## 2013-06-27 DIAGNOSIS — J4489 Other specified chronic obstructive pulmonary disease: Secondary | ICD-10-CM | POA: Diagnosis present

## 2013-06-27 DIAGNOSIS — J449 Chronic obstructive pulmonary disease, unspecified: Secondary | ICD-10-CM | POA: Diagnosis present

## 2013-06-27 DIAGNOSIS — I4510 Unspecified right bundle-branch block: Secondary | ICD-10-CM | POA: Diagnosis present

## 2013-06-27 DIAGNOSIS — I498 Other specified cardiac arrhythmias: Secondary | ICD-10-CM | POA: Diagnosis not present

## 2013-06-27 DIAGNOSIS — I4892 Unspecified atrial flutter: Secondary | ICD-10-CM | POA: Diagnosis present

## 2013-06-27 DIAGNOSIS — Z800 Family history of malignant neoplasm of digestive organs: Secondary | ICD-10-CM

## 2013-06-27 DIAGNOSIS — K219 Gastro-esophageal reflux disease without esophagitis: Secondary | ICD-10-CM | POA: Diagnosis present

## 2013-06-27 DIAGNOSIS — I4891 Unspecified atrial fibrillation: Secondary | ICD-10-CM | POA: Diagnosis present

## 2013-06-27 DIAGNOSIS — Z833 Family history of diabetes mellitus: Secondary | ICD-10-CM

## 2013-06-27 DIAGNOSIS — Z7901 Long term (current) use of anticoagulants: Secondary | ICD-10-CM

## 2013-06-27 DIAGNOSIS — I480 Paroxysmal atrial fibrillation: Secondary | ICD-10-CM | POA: Diagnosis present

## 2013-06-27 DIAGNOSIS — N400 Enlarged prostate without lower urinary tract symptoms: Secondary | ICD-10-CM | POA: Diagnosis present

## 2013-06-27 DIAGNOSIS — Z87891 Personal history of nicotine dependence: Secondary | ICD-10-CM

## 2013-06-27 DIAGNOSIS — J9819 Other pulmonary collapse: Secondary | ICD-10-CM | POA: Diagnosis not present

## 2013-06-27 DIAGNOSIS — E785 Hyperlipidemia, unspecified: Secondary | ICD-10-CM | POA: Diagnosis present

## 2013-06-27 DIAGNOSIS — J939 Pneumothorax, unspecified: Secondary | ICD-10-CM

## 2013-06-27 DIAGNOSIS — I440 Atrioventricular block, first degree: Secondary | ICD-10-CM | POA: Diagnosis present

## 2013-06-27 DIAGNOSIS — N529 Male erectile dysfunction, unspecified: Secondary | ICD-10-CM | POA: Diagnosis present

## 2013-06-27 DIAGNOSIS — I2510 Atherosclerotic heart disease of native coronary artery without angina pectoris: Secondary | ICD-10-CM | POA: Diagnosis present

## 2013-06-27 LAB — COMPREHENSIVE METABOLIC PANEL
ALK PHOS: 72 U/L (ref 39–117)
ALT: 10 U/L (ref 0–53)
AST: 17 U/L (ref 0–37)
Albumin: 3.6 g/dL (ref 3.5–5.2)
BUN: 19 mg/dL (ref 6–23)
CHLORIDE: 105 meq/L (ref 96–112)
CO2: 25 mEq/L (ref 19–32)
Calcium: 9.1 mg/dL (ref 8.4–10.5)
Creatinine, Ser: 1.12 mg/dL (ref 0.50–1.35)
GFR calc Af Amer: 68 mL/min — ABNORMAL LOW (ref >90)
GFR calc non Af Amer: 59 mL/min — ABNORMAL LOW (ref >90)
Glucose, Bld: 114 mg/dL — ABNORMAL HIGH (ref 70–99)
POTASSIUM: 4.8 meq/L (ref 3.7–5.3)
Sodium: 142 mEq/L (ref 137–147)
Total Bilirubin: 0.5 mg/dL (ref 0.3–1.2)
Total Protein: 7.2 g/dL (ref 6.0–8.3)

## 2013-06-27 LAB — BASIC METABOLIC PANEL
BUN: 22 mg/dL (ref 6–23)
CO2: 24 mEq/L (ref 19–32)
Calcium: 9.1 mg/dL (ref 8.4–10.5)
Chloride: 104 mEq/L (ref 96–112)
Creatinine, Ser: 1.25 mg/dL (ref 0.50–1.35)
GFR, EST AFRICAN AMERICAN: 60 mL/min — AB (ref >90)
GFR, EST NON AFRICAN AMERICAN: 51 mL/min — AB (ref >90)
Glucose, Bld: 114 mg/dL — ABNORMAL HIGH (ref 70–99)
Potassium: 4.5 mEq/L (ref 3.7–5.3)
SODIUM: 142 meq/L (ref 137–147)

## 2013-06-27 LAB — CBC WITH DIFFERENTIAL/PLATELET
BASOS PCT: 0 % (ref 0–1)
Basophils Absolute: 0 10*3/uL (ref 0.0–0.1)
EOS ABS: 0.2 10*3/uL (ref 0.0–0.7)
Eosinophils Relative: 2 % (ref 0–5)
HCT: 42.9 % (ref 39.0–52.0)
Hemoglobin: 14.9 g/dL (ref 13.0–17.0)
Lymphocytes Relative: 15 % (ref 12–46)
Lymphs Abs: 1.3 10*3/uL (ref 0.7–4.0)
MCH: 35.9 pg — AB (ref 26.0–34.0)
MCHC: 34.7 g/dL (ref 30.0–36.0)
MCV: 103.4 fL — ABNORMAL HIGH (ref 78.0–100.0)
MONOS PCT: 10 % (ref 3–12)
Monocytes Absolute: 0.9 10*3/uL (ref 0.1–1.0)
NEUTROS PCT: 73 % (ref 43–77)
Neutro Abs: 6.5 10*3/uL (ref 1.7–7.7)
PLATELETS: 278 10*3/uL (ref 150–400)
RBC: 4.15 MIL/uL — ABNORMAL LOW (ref 4.22–5.81)
RDW: 13.5 % (ref 11.5–15.5)
WBC: 8.9 10*3/uL (ref 4.0–10.5)

## 2013-06-27 LAB — PROTIME-INR
INR: 1.05 (ref 0.00–1.49)
INR: 1.09 (ref 0.00–1.49)
Prothrombin Time: 13.5 seconds (ref 11.6–15.2)
Prothrombin Time: 13.9 seconds (ref 11.6–15.2)

## 2013-06-27 LAB — CBC
HCT: 43.9 % (ref 39.0–52.0)
Hemoglobin: 15.2 g/dL (ref 13.0–17.0)
MCH: 35.8 pg — ABNORMAL HIGH (ref 26.0–34.0)
MCHC: 34.6 g/dL (ref 30.0–36.0)
MCV: 103.5 fL — ABNORMAL HIGH (ref 78.0–100.0)
PLATELETS: 270 10*3/uL (ref 150–400)
RBC: 4.24 MIL/uL (ref 4.22–5.81)
RDW: 13.5 % (ref 11.5–15.5)
WBC: 11.7 10*3/uL — AB (ref 4.0–10.5)

## 2013-06-27 LAB — BLOOD GAS, ARTERIAL
ACID-BASE EXCESS: 0.2 mmol/L (ref 0.0–2.0)
Bicarbonate: 23.6 mEq/L (ref 20.0–24.0)
Drawn by: 277331
FIO2: 0.21 %
O2 Saturation: 97 %
PATIENT TEMPERATURE: 97.9
TCO2: 24.6 mmol/L (ref 0–100)
pCO2 arterial: 33 mmHg — ABNORMAL LOW (ref 35.0–45.0)
pH, Arterial: 7.466 — ABNORMAL HIGH (ref 7.350–7.450)
pO2, Arterial: 86 mmHg (ref 80.0–100.0)

## 2013-06-27 LAB — TYPE AND SCREEN
ABO/RH(D): A POS
Antibody Screen: NEGATIVE

## 2013-06-27 LAB — APTT
aPTT: 29 seconds (ref 24–37)
aPTT: 31 seconds (ref 24–37)

## 2013-06-27 LAB — MRSA PCR SCREENING: MRSA by PCR: NEGATIVE

## 2013-06-27 MED ORDER — HYDRALAZINE HCL 20 MG/ML IJ SOLN: 10.0000 mg | Freq: Four times a day (QID) | INTRAMUSCULAR | Status: DC | PRN

## 2013-06-27 MED ORDER — DOCUSATE SODIUM 100 MG PO CAPS: 100.0000 mg | ORAL_CAPSULE | Freq: Every day | ORAL | Status: DC | PRN

## 2013-06-27 MED ORDER — MIDAZOLAM HCL 2 MG/2ML IJ SOLN
INTRAMUSCULAR | Status: AC
  Filled 2013-06-27: qty 4

## 2013-06-27 MED ORDER — PANTOPRAZOLE SODIUM 40 MG PO TBEC: 40.0000 mg | DELAYED_RELEASE_TABLET | Freq: Every day | ORAL | Status: DC

## 2013-06-27 MED ORDER — TAMSULOSIN HCL 0.4 MG PO CAPS
0.4000 mg | ORAL_CAPSULE | Freq: Every day | ORAL | Status: DC
  Administered 2013-06-28 – 2013-07-03 (×6): 0.4 mg via ORAL
  Filled 2013-06-27 (×6): qty 1

## 2013-06-27 MED ORDER — TRAMADOL HCL 50 MG PO TABS: 50.0000 mg | ORAL_TABLET | Freq: Four times a day (QID) | ORAL | Status: DC | PRN

## 2013-06-27 MED ORDER — DEXTROSE 5 % IV SOLN
1.5000 g | INTRAVENOUS | Status: DC
  Filled 2013-06-27: qty 1.5

## 2013-06-27 MED ORDER — HYDROCODONE-ACETAMINOPHEN 5-325 MG PO TABS
1.0000 | ORAL_TABLET | Freq: Four times a day (QID) | ORAL | Status: DC | PRN
  Administered 2013-06-27 (×2): 1 via ORAL
  Filled 2013-06-27 (×2): qty 1

## 2013-06-27 MED ORDER — ACETAMINOPHEN 650 MG RE SUPP: 650.0000 mg | Freq: Four times a day (QID) | RECTAL | Status: DC | PRN

## 2013-06-27 MED ORDER — OCUVITE PO TABS
1.0000 | ORAL_TABLET | Freq: Every day | ORAL | Status: DC
  Administered 2013-06-30 – 2013-07-03 (×4): 1 via ORAL
  Filled 2013-06-27 (×7): qty 1

## 2013-06-27 MED ORDER — DEXTROSE 5 % IV SOLN
1.5000 g | Freq: Two times a day (BID) | INTRAVENOUS | Status: DC
  Administered 2013-06-27 – 2013-06-28 (×3): 1.5 g via INTRAVENOUS
  Filled 2013-06-27 (×4): qty 1.5

## 2013-06-27 MED ORDER — ONDANSETRON HCL 4 MG/2ML IJ SOLN: 4.0000 mg | Freq: Four times a day (QID) | INTRAMUSCULAR | Status: DC | PRN

## 2013-06-27 MED ORDER — TIOTROPIUM BROMIDE MONOHYDRATE 18 MCG IN CAPS
18.0000 ug | ORAL_CAPSULE | Freq: Every day | RESPIRATORY_TRACT | Status: DC
Start: 2013-06-27 — End: 2013-07-03
  Administered 2013-06-29 – 2013-07-03 (×5): 18 ug via RESPIRATORY_TRACT
  Filled 2013-06-27: qty 5

## 2013-06-27 MED ORDER — ONDANSETRON HCL 4 MG PO TABS: 4.0000 mg | ORAL_TABLET | Freq: Four times a day (QID) | ORAL | Status: DC | PRN

## 2013-06-27 MED ORDER — SODIUM CHLORIDE 0.9 % IJ SOLN: 3.0000 mL | INTRAMUSCULAR | Status: DC | PRN

## 2013-06-27 MED ORDER — LORATADINE 10 MG PO TABS
10.0000 mg | ORAL_TABLET | Freq: Every day | ORAL | Status: DC
  Administered 2013-06-30 – 2013-07-03 (×3): 10 mg via ORAL
  Filled 2013-06-27 (×7): qty 1

## 2013-06-27 MED ORDER — ALUM & MAG HYDROXIDE-SIMETH 200-200-20 MG/5ML PO SUSP: 30.0000 mL | Freq: Four times a day (QID) | ORAL | Status: DC | PRN

## 2013-06-27 MED ORDER — SODIUM CHLORIDE 0.9 % IJ SOLN
3.0000 mL | Freq: Two times a day (BID) | INTRAMUSCULAR | Status: DC
  Administered 2013-06-27: 3 mL via INTRAVENOUS

## 2013-06-27 MED ORDER — ACETAMINOPHEN 325 MG PO TABS
650.0000 mg | ORAL_TABLET | Freq: Four times a day (QID) | ORAL | Status: DC | PRN
  Administered 2013-06-27: 650 mg via ORAL
  Filled 2013-06-27: qty 2

## 2013-06-27 MED ORDER — SODIUM CHLORIDE 0.9 % IV SOLN: 250.0000 mL | INTRAVENOUS | Status: DC | PRN

## 2013-06-27 MED ORDER — CEFUROXIME SODIUM 1.5 G IJ SOLR
1.5000 g | Freq: Two times a day (BID) | INTRAMUSCULAR | Status: DC
  Filled 2013-06-27 (×2): qty 1.5

## 2013-06-27 MED ORDER — LEVALBUTEROL HCL 0.63 MG/3ML IN NEBU
0.6300 mg | INHALATION_SOLUTION | Freq: Four times a day (QID) | RESPIRATORY_TRACT | Status: DC | PRN
Start: 2013-06-27 — End: 2013-07-03

## 2013-06-27 MED ORDER — FENTANYL CITRATE 0.05 MG/ML IJ SOLN
INTRAMUSCULAR | Status: AC
  Filled 2013-06-27: qty 2

## 2013-06-27 MED ORDER — TRAZODONE HCL 100 MG PO TABS
100.0000 mg | ORAL_TABLET | Freq: Every day | ORAL | Status: DC
  Administered 2013-06-27 – 2013-07-02 (×5): 100 mg via ORAL
  Filled 2013-06-27 (×7): qty 1

## 2013-06-27 NOTE — ED Notes (Signed)
[place] hooked up to continuous suction set at 20.

## 2013-06-27 NOTE — ED Notes (Signed)
Pt here for SOB due to possible pneumo. Pt was just here Tuesday for spontaneous pneumo on the right side. Pt returning with decreased lung sounds on the right, SOB and low sats.

## 2013-06-27 NOTE — ED Notes (Signed)
[place] and chest tube tray at bedside.

## 2013-06-27 NOTE — ED Notes (Addendum)
Time out performed at this time. Consent at bedside.

## 2013-06-27 NOTE — ED Notes (Signed)
PT is A&Ox3; no signs of distress; respirations even and unlabored.

## 2013-06-27 NOTE — ED Notes (Signed)
Xray at bedside.

## 2013-06-27 NOTE — H&P (Signed)
patient examined and medical record reviewed,agree with above note. VAN TRIGT III,Kadedra Vanaken 06/27/2013

## 2013-06-27 NOTE — ED Notes (Signed)
Attempted report 

## 2013-06-27 NOTE — ED Notes (Addendum)
PA at bedside. Consent form signed and at the bedside.

## 2013-06-27 NOTE — Progress Notes (Signed)
Utilization review completed. Gennett Garcia, RN, BSN. 

## 2013-06-27 NOTE — ED Provider Notes (Signed)
TIME SEEN: 9:51 AM  CHIEF COMPLAINT: Shortness of breath  HPI: Patient is a 80 y.o. M with history of A. fib on Xarelto, CAD, COPD, prior history of tobacco use, prior left-sided pneumothorax and 2012 and right-sided pneumothorax status post chest tube in admission on 06/21/13 by thoracic surgery who presents emergency department with sudden onset shortness of breath that started this morning while brushing his teeth. EMS reports decreased lung sounds on the right. His oxygen saturation was 82% on room air but he is 92% on 6 L nasal cannula. He states he is feeling better with oxygen. Denies any chest pain. No fevers or cough. No lower extremity swelling or pain. No prior history of PE or DVT. He started taking his Xarelto yesterday.  ROS: See HPI Constitutional: no fever  Eyes: no drainage  ENT: no runny nose   Cardiovascular:  no chest pain  Resp: SOB  GI: no vomiting GU: no dysuria Integumentary: no rash  Allergy: no hives  Musculoskeletal: no leg swelling  Neurological: no slurred speech ROS otherwise negative  PAST MEDICAL HISTORY/PAST SURGICAL HISTORY:  Past Medical History  Diagnosis Date  . Hyperlipidemia   . Spontaneous pneumothorax 09/04/2010    [name]  . Erectile dysfunction   . AF (atrial fibrillation)     after PTX 2012  . Coronary artery disease   . HSV-1 (herpes simplex virus 1) infection   . Elevated MCV   . Allergy   . GERD (gastroesophageal reflux disease)   . Arthritis   . BPH (benign prostatic hyperplasia)   . Back pain   . COPD (chronic obstructive pulmonary disease)     MEDICATIONS:  Prior to Admission medications   Medication Sig Start Date End Date Taking? Authorizing Provider  beta carotene w/minerals (OCUVITE) tablet Take 1 tablet by mouth daily.     Yes Historical Provider, MD  docusate sodium (COLACE) 100 MG capsule Take 100 mg by mouth daily.   Yes Historical Provider, MD  loratadine (CLARITIN) 10 MG tablet Take 10 mg by mouth daily as  needed for allergies.    Yes Historical Provider, MD  omeprazole (PRILOSEC) 20 MG capsule Take 20 mg by mouth daily.     Yes Historical Provider, MD  Rivaroxaban (XARELTO) 15 MG TABS tablet Take 15 mg by mouth every morning.   Yes Historical Provider, MD  tiotropium (SPIRIVA) 18 MCG inhalation capsule Place 1 capsule (18 mcg total) into inhaler and inhale daily. 06/11/13  Yes Juanito Doom, MD  traMADol (ULTRAM) 50 MG tablet Take 1 tablet (50 mg total) by mouth every 6 (six) hours as needed for moderate pain. 06/24/13  Yes Erin Barrett, PA-C  traZODone (DESYREL) 100 MG tablet Take 200 mg by mouth at bedtime.   Yes Historical Provider, MD  albuterol (PROAIR HFA) 108 (90 BASE) MCG/ACT inhaler Inhale 2 puffs into the lungs every 4 (four) hours as needed for wheezing or shortness of breath. 12/19/12   Juanito Doom, MD  ANORO ELLIPTA 62.5-25 MCG/INH AEPB Inhale 2 puffs into the lungs daily. 06/02/13   Historical Provider, MD  tamsulosin (FLOMAX) 0.4 MG CAPS capsule Take 0.4 mg by mouth daily as needed (frequemcy).    Historical Provider, MD    ALLERGIES:  Allergies  Allergen Reactions  . Blistex     REACTION: lip swelling  . Rosuvastatin     REACTION: myalgias at 49m/day dose    SOCIAL HISTORY:  History  Substance Use Topics  . Smoking status: Former Smoker --  1.00 packs/day for 50 years    Types: Cigarettes    Quit date: 04/14/1999  . Smokeless tobacco: Not on file  . Alcohol Use: No     Comment: h/o use    FAMILY HISTORY: Family History  Problem Relation Age of Onset  . Diabetes Mother   . Prostate cancer Neg Hx   . Colon cancer Brother     possible dx, not certain  . Peripheral Artery Disease Father     EXAM: BP 156/95  Temp(Src) 98.1 F (36.7 C) (Temporal)  Resp 18  SpO2 90% CONSTITUTIONAL: Alert and oriented and responds appropriately to questions. Well-appearing; well-nourished HEAD: Normocephalic EYES: Conjunctivae clear, PERRL ENT: normal nose; no rhinorrhea;  moist mucous membranes; pharynx without lesions noted NECK: Supple, no meningismus, no LAD  CARD: RRR; S1 and S2 appreciated; no murmurs, no clicks, no rubs, no gallops RESP: Normal chest excursion without splinting; patient is mildly tachypnea, diminished breath sounds on the right side, he has a 2-3 cm surgical incision in the right midaxillary line at the level of the nipple with 2 sutures and is well approximated, there is a second chest wound, he does have some associated bruising around this wound but no erythema warmth or purulent drainage, patient is mildly tachypnea but is in no respiratory distress, hypoxic on room air ABD/GI: Normal bowel sounds; non-distended; soft, non-tender, no rebound, no guarding BACK:  The back appears normal and is non-tender to palpation, there is no CVA tenderness EXT: Normal ROM in all joints; non-tender to palpation; no edema; normal capillary refill; no cyanosis    SKIN: Normal color for age and race; warm NEURO: Moves all extremities equally no slurred speech or facial droop PSYCH: The patient's mood and manner are appropriate. Grooming and personal hygiene are appropriate.  MEDICAL DECISION MAKING: Patient here with decreased breath sounds on the right side and shortness of breath, hypoxia. Differential diagnosis includes pneumothorax, large pleural effusion, pneumonia, PE. We'll obtain labs, chest x-ray and consult cardiothoracic surgery as a suspect this is likely a pneumothorax. Patient's sats 92% on 6 L nasal cannula. We'll place him on her breather.    10:15 AM  Patient has right-sided pneumothorax confirmed by chest x-rays. Spoke with Dr. Berlin Hun with cardiothoracic surgery who will see the patient in the emergency department and place a chest tube and will admit the patient. Labs are pending. EKG shows no ischemic changes. Patient is satting 94% on nonrebreather and his tachypnea has improved.   11:30 PM  CT surgery at bedside and has placed  chest tube. Patient stable. Labs unremarkable. Repeat x-ray shows complete reexpansion of the lung. Patient stable and awaiting bed for admission.    CRITICAL CARE Performed by: Nyra Jabs   Total critical care time: 30 minutes  Critical care time was exclusive of separately billable procedures and treating other patients.  Critical care was necessary to treat or prevent imminent or life-threatening deterioration.  Critical care was time spent personally by me on the following activities: development of treatment plan with patient and/or surrogate as well as nursing, discussions with consultants, evaluation of patient's response to treatment, examination of patient, obtaining history from patient or surrogate, ordering and performing treatments and interventions, ordering and review of laboratory studies, ordering and review of radiographic studies, pulse oximetry and re-evaluation of patient's condition.      EKG Interpretation  Date/Time:  Thursday June 27 2013 09:44:42 EDT Ventricular Rate:  83 PR Interval:    QRS Duration:  140 QT Interval:  409 QTC Calculation: 481 R Axis:   95 Text Interpretation:  Atrial flutter IVCD, consider atypical RBBB Probable lateral infarct, old Artifact No significant change since last tracing Confirmed by Sanav Remer,  DO, Frederick Marro [phone]) on 06/27/2013 10:02:40 AM        Delice Bison Mellina Benison, DO 06/27/13 [id number]

## 2013-06-27 NOTE — ED Notes (Signed)
Patient removed from NRB and placed on 4L [place].

## 2013-06-27 NOTE — ED Notes (Signed)
25 mcg of fentanyl and 1 mg versed given IVP per MD Vantrigt.

## 2013-06-27 NOTE — ED Notes (Signed)
EDP made aware of xray results.

## 2013-06-27 NOTE — H&P (Signed)
[name],[name] is an 80 y.o. male. Jun 25, 1928 [id number]   Chief Complaint: Shortness of breath after going to the bathroom this morning  History of Presenting Illness:  This is an 80 year old Caucasian male with a past medical history of right spontaneuous pneumothorax (discharged from [facility] 06/25/2013), tobacco abuse, and COPD who after using the bathroom this am became profoundly short of breath. He denies any chest pain, pleuritic pain, nausea, or diaphoresis. He presented to [facility] Emergency Department. CXR done showed a 40% right, recurrent spontaneous pneumothorax. Dr. Prescott Gum has been consulted to place a right chest tube and admit. The patient is on a 100% NRB with oxygen saturation in the 90's.  Past Medical History: Past Medical History  Diagnosis Date  . Hyperlipidemia   . Spontaneous pneumothorax 09/04/2010    [name]  . Erectile dysfunction   . AF (atrial fibrillation)     after PTX 2012  . Coronary artery disease   . HSV-1 (herpes simplex virus 1) infection   . Elevated MCV   . Allergy   . GERD (gastroesophageal reflux disease)   . Arthritis   . BPH (benign prostatic hyperplasia)   . Back pain   . COPD (chronic obstructive pulmonary disease)   Right spontaneous pneumothorax 06/21/2013  Past Surgical History: Past Surgical History  Procedure Laterality Date  . Chest tube placement  09/04/2010  . Video assisted thoracoscopy  12/15/2010    Procedure: VIDEO ASSISTED THORACOSCOPY;  Surgeon: Melrose Nakayama, MD;  Location: [place];  Service: Thoracic;  Laterality: Left;  blebectomy with pleural abrasion    Family History: Family History  Problem Relation Age of Onset  . Diabetes Mother   . Prostate cancer Neg Hx   . Colon cancer Brother     possible dx, not certain  . Peripheral Artery Disease Father      Social History:   reports that he  quit smoking about 14 years ago. His smoking use included Cigarettes. He has a 50 pack-year smoking history. He does not have any smokeless tobacco history on file. He reports that he does not drink alcohol or use illicit drugs.  Allergies:  Allergies  Allergen Reactions  . Blistex     REACTION: lip swelling  . Rosuvastatin     REACTION: myalgias at 69m/day dose    Review of Systems:  Cardiac Review of Systems: Y or N  Chest Pain [ N ] Resting SOB [[email or web address].Cullens ] Exertional SOB [ Y ] Orthopnea [Aqua.Slicker]  Pedal Edema [Aqua.Slicker] Palpitations [Aqua.Slicker] Syncope [Aqua.Slicker] Presyncope [ N ]  General Review of Systems: [Y] = yes _0 =no  Constitional: fatigue [Y ]; nausea [Aqua.Slicker]; night sweats [ N]; fever [Aqua.Slicker]; or chills [Aqua.Slicker];  Eye : blurred vision [Aqua.Slicker]; diplopia [ N]; vision changes [Aqua.Slicker]; Amaurosis fugax[N ];  Resp: cough [Aqua.Slicker]; wheezing[Y ]; hemoptysis[N ]; shortness of breath[Y ]; paroxysmal nocturnal dyspnea[ N] GI: gallstones[N ], vomiting[N ]; dysphagia[N ]; melena[ N]; hematochezia [Aqua.Slicker]; heartburn[N ] GU: kidney stones [ N]; hematuria[ N]; dysuria [Aqua.Slicker]; nocturia[ N] Skin: rash, swelling[N ];, hair loss[ N]; peripheral edema[N ]; or itching[N ];  Musculosketetal:  myalgias[Y ]; joint swelling[N ]; joint erythema[N ];  Heme/Lymph: bruising[N ]; bleeding[ N]; anemia[ Y];  Neuro: TIA[N ]; headaches[N ]; stroke[ N]; vertigo[N ]; seizures[N ]; paresthesias[ N]; difficulty walking[ N];  Psych:depression[N ]; anxiety[ N];  Endocrine: diabetes_0 ; thyroid dysfunction_1 ;  Immunizations: Flu _2 ; Pneumococcal_3 ;  Other:  BP 156/95  Pulse 76  Temp(Src) 98.1 F (36.7 C) (Temporal)  Resp 23  SpO2 96%  Physical Exam: General appearance: alert, cooperative and mild distress Neurologic: intact Heart: irregularly irregular rhythm Lungs: Diminshed breath sounds on the right and expiratory wheezing present Abdomen: soft, non-tender; bowel sounds normal; no masses,  no organomegaly Extremities: No cyanosis, clubbing, or edema.  Pulses intact Wound: Chest tube suture intact. Ecchymosis right lateral chest wall   Diagnostic Studies and Laboratory Results: Results for orders placed during the hospital encounter of 06/27/13 (from the past 48 hour(s))  CBC WITH DIFFERENTIAL     Status: Abnormal   Collection Time    06/27/13 10:03 AM      Result Value Ref Range   WBC 8.9  4.0 - 10.5 K/uL   RBC 4.15 (*) 4.22 - 5.81 MIL/uL   Hemoglobin 14.9  13.0 - 17.0 g/dL   HCT 42.9  39.0 - 52.0 %   MCV 103.4 (*) 78.0 - 100.0 fL   MCH 35.9 (*) 26.0 - 34.0 pg   MCHC 34.7  30.0 - 36.0 g/dL   RDW 13.5  11.5 - 15.5 %   Platelets 278  150 - 400 K/uL   Neutrophils Relative % 73  43 - 77 %   Neutro Abs 6.5  1.7 - 7.7 K/uL   Lymphocytes Relative 15  12 - 46 %   Lymphs Abs 1.3  0.7 - 4.0 K/uL   Monocytes Relative 10  3 - 12 %   Monocytes Absolute 0.9  0.1 - 1.0 K/uL   Eosinophils Relative 2  0 - 5 %   Eosinophils Absolute 0.2  0.0 - 0.7 K/uL   Basophils Relative 0  0 - 1 %   Basophils Absolute 0.0  0.0 - 0.1 K/uL   Dg Chest Port 1 View  06/27/2013   CLINICAL DATA:  Followup pneumothorax.  EXAM: PORTABLE CHEST - 1 VIEW  COMPARISON:  06/25/2013.  FINDINGS: The right-sided pneumothorax has significantly enlarged. It is estimated at 40%. The left lung is clear.  IMPRESSION: Significant interval enlargement of the right-sided pneumothorax.  These results will be called to the ordering clinician or representative by the Radiologist Assistant, and communication documented in the PACS or zVision Dashboard.   Electronically Signed   By: Kalman Jewels M.D.   On: 06/27/2013 10:08     Assessment/Plan 1.Recurrent, right spontaneous pneumothorax. He will require a right chest tube and admission. CT of chest was done on last admission and showed an indeterminate nodular density centrally, advanced emphysematous changes within the lungs, areas of scarring and/or atelectasis right lung base and scarring left lung base, and atherosclerotic disease  within the aorta and coronary vessels. He will  need a right VATS on this admission as this is recurrent, likely in am. 2. History of a fib. On Xarelto, prior to admission. Will not start as will require surgery.    Nani Skillern, PA-C 06/27/2013, 10:34 AM

## 2013-06-27 NOTE — Progress Notes (Signed)
Pt arrived from ED VSS, CT hooked to 20 cm suction, no signs of resp distress nor complaints of. Some pain 3/10 at CT insertion site. Call bell within reach and oriented to unit and routine. CMT and elink notified. Will continue to monitor. Family at bedside.

## 2013-06-27 NOTE — Op Note (Signed)
Emergency right chest tube placement for pneumothorax  Pre-and postoperative diagnosis recurrent spontaneous right pneumothorax, 30%  Surgeon Prescott Gum M.D., Asst. Josie Saunders PA  Anesthesia local 1% lidocaine with IV conscious sedation, monitored   Procedure-the patient presented to the emergency department with severe shortness of breath and function desaturation with a chest x-ray showing recurrent right spontaneous pneumothorax. I discussed the situation with the patient and family and obtained informed consent for placement of a right chest tube in the emergency department.  A proper timeout was performed The right chest was prepped and draped as a sterile field centered on the previous incision for a previous chest tube placement. Local 1% lidocaine was infiltrated around the radius incision and the sutures were removed. Lidocaine was further infiltrated into the subcutaneous and intercostal muscle tissue. The right pleural space was entered with a sterile fingertip an immediate rush of air exited A 28 French chest tubes in position and and right pleural space and directed to the apex, clamped and then connected to a underwater seal Pleur-evac drainage system. Silk sutures were used to secure the chest tube and closed the small incision. A sterile dressing was applied. Followup chest x-ray revealed reexpansion of the lung with tube in adequate position. The procedure was tolerated well by the patient.

## 2013-06-27 NOTE — ED Notes (Addendum)
12.5 IVP fentanyl given per MD verbal. Pt tolerating procedure well and communicative throughout.

## 2013-06-27 NOTE — Progress Notes (Signed)
[name] presented today with a recurrent right pneumothorax. He had just been discharged on Tuesday.  He had a 30% pneumothorax. Dr. Prescott Gum placed a chest tube. He does not currently have an air leak.  I discussed the situation with [name] and [name]. Even if we could get the tube out again he would have nearly a 100% chance of the pneumo recurring. An intervention is warranted.  We discussed the options of VATS blebectomy v talc pleurodesis. He is familiar with the surgical approach from Randall previous surgery on the left side. We also discussed the possible 1.1 cm lung nodule in the right lung seen on CT.   I recommended that we proceed with a Right VATS, blebectomy, possible wedge resection, pleural abrasion for treatment of Randall pneumothorax +/- the nodule.  I discussed the general nature of the procedure, the need for general anesthesia, and the incisions to be used with [name]. We discussed the expected hospital stay, overall recovery and short and long term outcomes. They understand the risks include, but are not limited to death, stroke, MI, DVT/PE, bleeding, possible need for transfusion, infections, prolonged air leaks, cardiac arrhythmias, and other organ system dysfunction including respiratory, renal, or GI complications. He accepts the risks and agrees to proceed.  For OR tomorrow

## 2013-06-27 NOTE — ED Notes (Signed)
Report given to Hannah,RN

## 2013-06-27 NOTE — Telephone Encounter (Signed)
Copy mailed along with MD comments.

## 2013-06-27 NOTE — ED Notes (Addendum)
Xray at bedside. Pt A&Ox3; respirations even and unlabored; no acute distress.

## 2013-06-28 ENCOUNTER — Encounter (HOSPITAL_COMMUNITY): Payer: Medicare Other | Admitting: Critical Care Medicine

## 2013-06-28 ENCOUNTER — Inpatient Hospital Stay (HOSPITAL_COMMUNITY): Payer: Medicare Other

## 2013-06-28 ENCOUNTER — Encounter (HOSPITAL_COMMUNITY): Payer: Self-pay | Admitting: Critical Care Medicine

## 2013-06-28 ENCOUNTER — Encounter (HOSPITAL_COMMUNITY)
Admission: EM | Disposition: A | Discharge status: Home Health | Payer: Self-pay | Source: Home / Self Care | Attending: Thoracic Surgery (Cardiothoracic Vascular Surgery)

## 2013-06-28 ENCOUNTER — Inpatient Hospital Stay (HOSPITAL_COMMUNITY): Payer: Medicare Other | Admitting: Critical Care Medicine

## 2013-06-28 HISTORY — PX: VIDEO ASSISTED THORACOSCOPY: SHX5073

## 2013-06-28 SURGERY — VIDEO ASSISTED THORACOSCOPY
Anesthesia: General | Site: Chest | Laterality: Right

## 2013-06-28 MED ORDER — ONDANSETRON HCL 4 MG/2ML IJ SOLN: 4.0000 mg | Freq: Once | INTRAMUSCULAR | Status: DC | PRN

## 2013-06-28 MED ORDER — ACETAMINOPHEN 160 MG/5ML PO SOLN
1000.0000 mg | Freq: Four times a day (QID) | ORAL | Status: DC
  Administered 2013-06-28 – 2013-07-02 (×4): 1000 mg via ORAL
  Filled 2013-06-28 (×20): qty 40

## 2013-06-28 MED ORDER — HYDROMORPHONE HCL PF 1 MG/ML IJ SOLN
INTRAMUSCULAR | Status: AC
Start: 2013-06-28 — End: 2013-06-29
  Filled 2013-06-28: qty 1

## 2013-06-28 MED ORDER — LIDOCAINE HCL (CARDIAC) 20 MG/ML IV SOLN
INTRAVENOUS | Status: AC
  Filled 2013-06-28: qty 5

## 2013-06-28 MED ORDER — ONDANSETRON HCL 4 MG/2ML IJ SOLN
INTRAMUSCULAR | Status: DC | PRN
  Administered 2013-06-28: 4 mg via INTRAVENOUS

## 2013-06-28 MED ORDER — LACTATED RINGERS IV SOLN
INTRAVENOUS | Status: DC | PRN
  Administered 2013-06-28: 11:00:00 via INTRAVENOUS

## 2013-06-28 MED ORDER — FENTANYL 10 MCG/ML IV SOLN
INTRAVENOUS | Status: DC
  Administered 2013-06-28: 15:00:00 via INTRAVENOUS
  Administered 2013-06-28: 10 ug via INTRAVENOUS
  Administered 2013-06-28: 40 ug via INTRAVENOUS
  Administered 2013-06-29: 20 ug via INTRAVENOUS
  Administered 2013-06-29 (×3): 40 ug via INTRAVENOUS
  Administered 2013-06-29: 60 ug via INTRAVENOUS
  Administered 2013-06-29: 70 ug via INTRAVENOUS
  Administered 2013-06-30: 60 ug via INTRAVENOUS
  Administered 2013-06-30: 100 ug via INTRAVENOUS
  Administered 2013-06-30: 09:00:00 via INTRAVENOUS
  Administered 2013-06-30: 50 ug via INTRAVENOUS
  Administered 2013-06-30: 90 ug via INTRAVENOUS
  Administered 2013-06-30: 20 ug via INTRAVENOUS
  Administered 2013-07-01 (×2): 50 ug via INTRAVENOUS
  Administered 2013-07-01: 100 ug via INTRAVENOUS
  Filled 2013-06-28 (×2): qty 50

## 2013-06-28 MED ORDER — UMECLIDINIUM-VILANTEROL 62.5-25 MCG/INH IN AEPB: 2.0000 | INHALATION_SPRAY | Freq: Every day | RESPIRATORY_TRACT | Status: DC

## 2013-06-28 MED ORDER — RIVAROXABAN 15 MG PO TABS
15.0000 mg | ORAL_TABLET | Freq: Every day | ORAL | Status: DC
  Administered 2013-06-29 – 2013-07-03 (×5): 15 mg via ORAL
  Filled 2013-06-28 (×6): qty 1

## 2013-06-28 MED ORDER — METOCLOPRAMIDE HCL 5 MG/ML IJ SOLN
10.0000 mg | Freq: Four times a day (QID) | INTRAMUSCULAR | Status: AC
  Administered 2013-06-28 – 2013-06-29 (×4): 10 mg via INTRAVENOUS
  Filled 2013-06-28 (×4): qty 2

## 2013-06-28 MED ORDER — DIPHENHYDRAMINE HCL 50 MG/ML IJ SOLN: 12.5000 mg | Freq: Four times a day (QID) | INTRAMUSCULAR | Status: DC | PRN

## 2013-06-28 MED ORDER — OXYCODONE HCL 5 MG PO TABS: 5.0000 mg | ORAL_TABLET | ORAL | Status: DC | PRN

## 2013-06-28 MED ORDER — SODIUM CHLORIDE 0.9 % IJ SOLN: 9.0000 mL | INTRAMUSCULAR | Status: DC | PRN

## 2013-06-28 MED ORDER — FENTANYL CITRATE 0.05 MG/ML IJ SOLN: 50.0000 ug | INTRAMUSCULAR | Status: DC | PRN

## 2013-06-28 MED ORDER — DEXTROSE-NACL 5-0.45 % IV SOLN
INTRAVENOUS | Status: DC
  Administered 2013-06-28 – 2013-06-30 (×3): via INTRAVENOUS

## 2013-06-28 MED ORDER — TRAMADOL HCL 50 MG PO TABS: 50.0000 mg | ORAL_TABLET | Freq: Four times a day (QID) | ORAL | Status: DC | PRN

## 2013-06-28 MED ORDER — GLYCOPYRROLATE 0.2 MG/ML IJ SOLN
INTRAMUSCULAR | Status: DC | PRN
  Administered 2013-06-28: 0.6 mg via INTRAVENOUS

## 2013-06-28 MED ORDER — BISACODYL 5 MG PO TBEC
10.0000 mg | DELAYED_RELEASE_TABLET | Freq: Every day | ORAL | Status: DC
  Administered 2013-06-28 – 2013-07-01 (×4): 10 mg via ORAL
  Filled 2013-06-28 (×4): qty 2

## 2013-06-28 MED ORDER — FENTANYL CITRATE 0.05 MG/ML IJ SOLN
INTRAMUSCULAR | Status: AC
  Filled 2013-06-28: qty 5

## 2013-06-28 MED ORDER — ESMOLOL HCL 10 MG/ML IV SOLN
INTRAVENOUS | Status: DC | PRN
  Administered 2013-06-28: 20 mg via INTRAVENOUS

## 2013-06-28 MED ORDER — LACTATED RINGERS IV SOLN
INTRAVENOUS | Status: DC | PRN
Start: 2013-06-28 — End: 2013-06-28
  Administered 2013-06-28: 11:00:00 via INTRAVENOUS

## 2013-06-28 MED ORDER — ROCURONIUM BROMIDE 100 MG/10ML IV SOLN
INTRAVENOUS | Status: DC | PRN
  Administered 2013-06-28: 10 mg via INTRAVENOUS
  Administered 2013-06-28: 50 mg via INTRAVENOUS

## 2013-06-28 MED ORDER — ALBUTEROL SULFATE HFA 108 (90 BASE) MCG/ACT IN AERS: 2.0000 | INHALATION_SPRAY | RESPIRATORY_TRACT | Status: DC | PRN

## 2013-06-28 MED ORDER — NEOSTIGMINE METHYLSULFATE 10 MG/10ML IV SOLN
INTRAVENOUS | Status: AC
  Filled 2013-06-28: qty 1

## 2013-06-28 MED ORDER — PHENYLEPHRINE 40 MCG/ML (10ML) SYRINGE FOR IV PUSH (FOR BLOOD PRESSURE SUPPORT)
PREFILLED_SYRINGE | INTRAVENOUS | Status: AC
  Filled 2013-06-28: qty 10

## 2013-06-28 MED ORDER — NALOXONE HCL 0.4 MG/ML IJ SOLN: 0.4000 mg | INTRAMUSCULAR | Status: DC | PRN

## 2013-06-28 MED ORDER — POTASSIUM CHLORIDE 10 MEQ/50ML IV SOLN
10.0000 meq | Freq: Every day | INTRAVENOUS | Status: DC | PRN
  Filled 2013-06-28: qty 50

## 2013-06-28 MED ORDER — OXYCODONE HCL 5 MG/5ML PO SOLN: 5.0000 mg | Freq: Once | ORAL | Status: AC | PRN

## 2013-06-28 MED ORDER — DIPHENHYDRAMINE HCL 12.5 MG/5ML PO ELIX
12.5000 mg | ORAL_SOLUTION | Freq: Four times a day (QID) | ORAL | Status: DC | PRN
  Filled 2013-06-28: qty 5

## 2013-06-28 MED ORDER — OXYCODONE HCL 5 MG PO TABS
ORAL_TABLET | ORAL | Status: AC
  Filled 2013-06-28: qty 1

## 2013-06-28 MED ORDER — ALBUTEROL SULFATE (2.5 MG/3ML) 0.083% IN NEBU
2.5000 mg | INHALATION_SOLUTION | RESPIRATORY_TRACT | Status: DC | PRN
  Filled 2013-06-28: qty 3

## 2013-06-28 MED ORDER — LIDOCAINE HCL (CARDIAC) 20 MG/ML IV SOLN: INTRAVENOUS | Status: DC | PRN

## 2013-06-28 MED ORDER — SENNOSIDES-DOCUSATE SODIUM 8.6-50 MG PO TABS
1.0000 | ORAL_TABLET | Freq: Every day | ORAL | Status: DC
  Administered 2013-06-28 – 2013-07-02 (×4): 1 via ORAL
  Filled 2013-06-28 (×6): qty 1

## 2013-06-28 MED ORDER — PANTOPRAZOLE SODIUM 40 MG PO TBEC
40.0000 mg | DELAYED_RELEASE_TABLET | Freq: Every day | ORAL | Status: DC
  Administered 2013-06-28 – 2013-07-03 (×6): 40 mg via ORAL
  Filled 2013-06-28 (×6): qty 1

## 2013-06-28 MED ORDER — PROPOFOL 10 MG/ML IV BOLUS
INTRAVENOUS | Status: DC | PRN
  Administered 2013-06-28: 100 mg via INTRAVENOUS
  Administered 2013-06-28 (×2): 50 mg via INTRAVENOUS

## 2013-06-28 MED ORDER — PROPOFOL 10 MG/ML IV BOLUS
INTRAVENOUS | Status: AC
  Filled 2013-06-28: qty 20

## 2013-06-28 MED ORDER — FENTANYL CITRATE 0.05 MG/ML IJ SOLN
INTRAMUSCULAR | Status: DC | PRN
  Administered 2013-06-28: 100 ug via INTRAVENOUS
  Administered 2013-06-28 (×3): 50 ug via INTRAVENOUS

## 2013-06-28 MED ORDER — MIDAZOLAM HCL 5 MG/5ML IJ SOLN
INTRAMUSCULAR | Status: DC | PRN
  Administered 2013-06-28 (×2): 1 mg via INTRAVENOUS

## 2013-06-28 MED ORDER — ALBUTEROL SULFATE (2.5 MG/3ML) 0.083% IN NEBU
INHALATION_SOLUTION | RESPIRATORY_TRACT | Status: AC
  Filled 2013-06-28: qty 3

## 2013-06-28 MED ORDER — ROCURONIUM BROMIDE 50 MG/5ML IV SOLN
INTRAVENOUS | Status: AC
  Filled 2013-06-28: qty 1

## 2013-06-28 MED ORDER — 0.9 % SODIUM CHLORIDE (POUR BTL) OPTIME
TOPICAL | Status: DC | PRN
  Administered 2013-06-28: 1000 mL

## 2013-06-28 MED ORDER — MIDAZOLAM HCL 2 MG/2ML IJ SOLN
INTRAMUSCULAR | Status: AC
  Filled 2013-06-28: qty 2

## 2013-06-28 MED ORDER — MIDAZOLAM HCL 2 MG/2ML IJ SOLN: 1.0000 mg | INTRAMUSCULAR | Status: DC | PRN

## 2013-06-28 MED ORDER — ONDANSETRON HCL 4 MG/2ML IJ SOLN
4.0000 mg | Freq: Four times a day (QID) | INTRAMUSCULAR | Status: DC | PRN
  Filled 2013-06-28: qty 2

## 2013-06-28 MED ORDER — ACETAMINOPHEN 500 MG PO TABS
1000.0000 mg | ORAL_TABLET | Freq: Four times a day (QID) | ORAL | Status: DC
  Administered 2013-06-29 – 2013-07-03 (×15): 1000 mg via ORAL
  Filled 2013-06-28 (×17): qty 2

## 2013-06-28 MED ORDER — FENTANYL CITRATE 0.05 MG/ML IJ SOLN
INTRAMUSCULAR | Status: AC
  Filled 2013-06-28: qty 2

## 2013-06-28 MED ORDER — ONDANSETRON HCL 4 MG/2ML IJ SOLN
4.0000 mg | Freq: Four times a day (QID) | INTRAMUSCULAR | Status: DC | PRN
  Administered 2013-06-29: 4 mg via INTRAVENOUS

## 2013-06-28 MED ORDER — GLYCOPYRROLATE 0.2 MG/ML IJ SOLN
INTRAMUSCULAR | Status: AC
  Filled 2013-06-28: qty 3

## 2013-06-28 MED ORDER — DEXTROSE 5 % IV SOLN
1.5000 g | Freq: Two times a day (BID) | INTRAVENOUS | Status: AC
  Administered 2013-06-28 – 2013-06-29 (×2): 1.5 g via INTRAVENOUS
  Filled 2013-06-28 (×2): qty 1.5

## 2013-06-28 MED ORDER — PHENYLEPHRINE HCL 10 MG/ML IJ SOLN
10.0000 mg | INTRAVENOUS | Status: DC | PRN
  Administered 2013-06-28: 20 ug/min via INTRAVENOUS

## 2013-06-28 MED ORDER — MENTHOL 3 MG MT LOZG
1.0000 | LOZENGE | OROMUCOSAL | Status: DC | PRN
  Filled 2013-06-28: qty 9

## 2013-06-28 MED ORDER — OXYCODONE HCL 5 MG PO TABS
5.0000 mg | ORAL_TABLET | Freq: Once | ORAL | Status: AC | PRN
  Administered 2013-06-28: 5 mg via ORAL

## 2013-06-28 MED ORDER — HYDROMORPHONE HCL PF 1 MG/ML IJ SOLN
0.2500 mg | INTRAMUSCULAR | Status: DC | PRN
  Administered 2013-06-28 (×4): 0.5 mg via INTRAVENOUS

## 2013-06-28 MED ORDER — PHENOL 1.4 % MT LIQD
1.0000 | OROMUCOSAL | Status: DC | PRN
  Filled 2013-06-28: qty 177

## 2013-06-28 MED ORDER — ESMOLOL HCL 10 MG/ML IV SOLN
INTRAVENOUS | Status: AC
Start: 2013-06-28 — End: 2013-06-28
  Filled 2013-06-28: qty 10

## 2013-06-28 MED ORDER — NEOSTIGMINE METHYLSULFATE 10 MG/10ML IV SOLN
INTRAVENOUS | Status: DC | PRN
  Administered 2013-06-28: 4 mg via INTRAVENOUS

## 2013-06-28 MED ORDER — HYDROMORPHONE HCL PF 1 MG/ML IJ SOLN
INTRAMUSCULAR | Status: AC
  Filled 2013-06-28: qty 1

## 2013-06-28 MED ORDER — MEPERIDINE HCL 25 MG/ML IJ SOLN: 6.2500 mg | INTRAMUSCULAR | Status: DC | PRN

## 2013-06-28 SURGICAL SUPPLY — 73 items
APPLIER CLIP ROT 10 11.4 M/L (STAPLE)
CANISTER SUCTION 2500CC (MISCELLANEOUS) ×3 IMPLANT
CATH KIT ON Q 5IN SLV (PAIN MANAGEMENT) IMPLANT
CATH THORACIC 28FR (CATHETERS) ×3 IMPLANT
CATH THORACIC 28FR RT ANG (CATHETERS) IMPLANT
CATH THORACIC 36FR (CATHETERS) IMPLANT
CATH THORACIC 36FR RT ANG (CATHETERS) IMPLANT
CLEANER TIP ELECTROSURG 2X2 (MISCELLANEOUS) ×3 IMPLANT
CLIP APPLIE ROT 10 11.4 M/L (STAPLE) IMPLANT
CLIP TI MEDIUM 6 (CLIP) IMPLANT
CONN Y 3/8X3/8X3/8  BEN (MISCELLANEOUS) ×2
CONN Y 3/8X3/8X3/8 BEN (MISCELLANEOUS) ×1 IMPLANT
CONT SPEC 4OZ CLIKSEAL STRL BL (MISCELLANEOUS) ×6 IMPLANT
COVER SURGICAL LIGHT HANDLE (MISCELLANEOUS) ×3 IMPLANT
DERMABOND ADVANCED (GAUZE/BANDAGES/DRESSINGS) ×2
DERMABOND ADVANCED .7 DNX12 (GAUZE/BANDAGES/DRESSINGS) ×1 IMPLANT
DRAPE LAPAROSCOPIC ABDOMINAL (DRAPES) ×3 IMPLANT
DRAPE WARM FLUID 44X44 (DRAPE) ×3 IMPLANT
ELECT REM PT RETURN 9FT ADLT (ELECTROSURGICAL) ×3
ELECTRODE REM PT RTRN 9FT ADLT (ELECTROSURGICAL) ×1 IMPLANT
GLOVE SURG SIGNA 7.5 PF LTX (GLOVE) ×6 IMPLANT
GOWN STRL REUS W/ TWL LRG LVL3 (GOWN DISPOSABLE) ×2 IMPLANT
GOWN STRL REUS W/ TWL XL LVL3 (GOWN DISPOSABLE) ×1 IMPLANT
GOWN STRL REUS W/TWL LRG LVL3 (GOWN DISPOSABLE) ×4
GOWN STRL REUS W/TWL XL LVL3 (GOWN DISPOSABLE) ×2
HANDLE STAPLE ENDO GIA SHORT (STAPLE) ×2
HEMOSTAT SURGICEL 2X14 (HEMOSTASIS) IMPLANT
KIT BASIN OR (CUSTOM PROCEDURE TRAY) ×3 IMPLANT
KIT ROOM TURNOVER OR (KITS) ×3 IMPLANT
KIT SUCTION CATH 14FR (SUCTIONS) ×3 IMPLANT
NS IRRIG 1000ML POUR BTL (IV SOLUTION) ×6 IMPLANT
PACK CHEST (CUSTOM PROCEDURE TRAY) ×3 IMPLANT
PAD ARMBOARD 7.5X6 YLW CONV (MISCELLANEOUS) ×6 IMPLANT
POUCH ENDO CATCH II 15MM (MISCELLANEOUS) IMPLANT
POUCH SPECIMEN RETRIEVAL 10MM (ENDOMECHANICALS) IMPLANT
RELOAD EGIA 45 TAN VASC (STAPLE) ×9 IMPLANT
RELOAD TRI 45 ART MED THCK PUR (STAPLE) ×3 IMPLANT
RELOAD TRI 60 ART MED THCK PUR (STAPLE) ×3 IMPLANT
SEALANT PROGEL (MISCELLANEOUS) IMPLANT
SEALANT SURG COSEAL 4ML (VASCULAR PRODUCTS) IMPLANT
SEALANT SURG COSEAL 8ML (VASCULAR PRODUCTS) IMPLANT
SOLUTION ANTI FOG 6CC (MISCELLANEOUS) ×3 IMPLANT
SPECIMEN JAR MEDIUM (MISCELLANEOUS) ×3 IMPLANT
SPONGE GAUZE 4X4 12PLY (GAUZE/BANDAGES/DRESSINGS) ×3 IMPLANT
SPONGE GAUZE 4X4 12PLY STER LF (GAUZE/BANDAGES/DRESSINGS) ×3 IMPLANT
SPONGE INTESTINAL PEANUT (DISPOSABLE) IMPLANT
STAPLER ENDO GIA 12MM SHORT (STAPLE) ×1 IMPLANT
SUT PROLENE 4 0 RB 1 (SUTURE)
SUT PROLENE 4-0 RB1 .5 CRCL 36 (SUTURE) IMPLANT
SUT SILK  1 MH (SUTURE) ×4
SUT SILK 1 MH (SUTURE) ×2 IMPLANT
SUT SILK 2 0SH CR/8 30 (SUTURE) IMPLANT
SUT SILK 3 0SH CR/8 30 (SUTURE) IMPLANT
SUT VIC AB 1 CTX 36 (SUTURE) ×2
SUT VIC AB 1 CTX36XBRD ANBCTR (SUTURE) ×1 IMPLANT
SUT VIC AB 2-0 CTX 36 (SUTURE) ×3 IMPLANT
SUT VIC AB 2-0 UR6 27 (SUTURE) IMPLANT
SUT VIC AB 3-0 MH 27 (SUTURE) IMPLANT
SUT VIC AB 3-0 X1 27 (SUTURE) ×3 IMPLANT
SUT VICRYL 2 TP 1 (SUTURE) IMPLANT
SWAB COLLECTION DEVICE MRSA (MISCELLANEOUS) IMPLANT
SYSTEM SAHARA CHEST DRAIN ATS (WOUND CARE) ×3 IMPLANT
TAPE CLOTH 4X10 WHT NS (GAUZE/BANDAGES/DRESSINGS) ×3 IMPLANT
TAPE CLOTH SOFT 2X10 (GAUZE/BANDAGES/DRESSINGS) ×3 IMPLANT
TIP APPLICATOR SPRAY EXTEND 16 (VASCULAR PRODUCTS) IMPLANT
TOWEL OR 17X24 6PK STRL BLUE (TOWEL DISPOSABLE) ×3 IMPLANT
TOWEL OR 17X26 10 PK STRL BLUE (TOWEL DISPOSABLE) ×6 IMPLANT
TRAP SPECIMEN MUCOUS 40CC (MISCELLANEOUS) IMPLANT
TRAY FOLEY CATH 16FRSI W/METER (SET/KITS/TRAYS/PACK) ×3 IMPLANT
TROCAR XCEL NON-BLD 5MMX100MML (ENDOMECHANICALS) IMPLANT
TUBE ANAEROBIC SPECIMEN COL (MISCELLANEOUS) IMPLANT
TUNNELER SHEATH ON-Q 11GX8 DSP (PAIN MANAGEMENT) IMPLANT
WATER STERILE IRR 1000ML POUR (IV SOLUTION) ×6 IMPLANT

## 2013-06-28 NOTE — Interval H&P Note (Signed)
History and Physical Interval Note:  06/28/2013 10:42 AM  [name]  has presented today for surgery, with the diagnosis of (R) PNEUMOTHORAX  The various methods of treatment have been discussed with the patient and family. After consideration of risks, benefits and other options for treatment, the patient has consented to  Procedure(s) with comments: [place] (Right) - (R) VATS W/BLEBECTOMY as a surgical intervention .  The patient's history has been reviewed, patient examined, no change in status, stable for surgery.  I have reviewed the patient's chart and labs.  Questions were answered to the patient's satisfaction.     Kea Callan C

## 2013-06-28 NOTE — Transfer of Care (Signed)
Immediate Anesthesia Transfer of Care Note  Patient: [name]  Procedure(s) Performed: Procedure(s) with comments: VIDEO ASSISTED THORACOSCOPY with stapling of blebs (Right) - (R) VATS W/BLEBECTOMY  Patient Location: PACU  Anesthesia Type:General  Level of Consciousness: awake, alert  and oriented  Airway & Oxygen Therapy: Patient Spontanous Breathing and Patient connected to face mask oxygen  Post-op Assessment: Report given to PACU RN, Post -op Vital signs reviewed and stable and Patient moving all extremities X 4  Post vital signs: Reviewed and stable  Complications: No apparent anesthesia complications

## 2013-06-28 NOTE — Anesthesia Preprocedure Evaluation (Addendum)
Anesthesia Evaluation  Patient identified by MRN, date of birth, ID band Patient awake    Reviewed: Allergy & Precautions, H&P , NPO status , Patient's Chart, lab work & pertinent test results  Airway Mallampati: I TM Distance: >3 FB Neck ROM: Full    Dental   Pulmonary former smoker,          Cardiovascular     Neuro/Psych    GI/Hepatic GERD-  Medicated and Controlled,  Endo/Other    Renal/GU      Musculoskeletal   Abdominal   Peds  Hematology   Anesthesia Other Findings   Reproductive/Obstetrics                          Anesthesia Physical Anesthesia Plan  ASA: III  Anesthesia Plan: General   Post-op Pain Management:    Induction: Intravenous  Airway Management Planned: Oral ETT  Additional Equipment:   Intra-op Plan:   Post-operative Plan: Extubation in OR  Informed Consent: I have reviewed the patients History and Physical, chart, labs and discussed the procedure including the risks, benefits and alternatives for the proposed anesthesia with the patient or authorized representative who has indicated his/her understanding and acceptance.     Plan Discussed with: CRNA and Surgeon  Anesthesia Plan Comments:         Anesthesia Quick Evaluation

## 2013-06-28 NOTE — Brief Op Note (Signed)
06/27/2013 - 06/28/2013  1:05 PM  PATIENT:  [name]  80 y.o. male  PRE-OPERATIVE DIAGNOSIS:  (R) PNEUMOTHORAX  POST-OPERATIVE DIAGNOSIS:  right pneumothorax  PROCEDURE:  Procedure(s) with comments:  VIDEO ASSISTED THORACOSCOPY  -Blebectomy x 2 Left Lower Lobe -Mechanical Pleurodesis  SURGEON:  Surgeon(s) and Role:    * Melrose Nakayama, MD - Primary  PHYSICIAN ASSISTANT: Ellwood Handler PA-C  ANESTHESIA:   general  EBL:  Total I/O In: 800 [I.V.:800] Out: 350 [Urine:300; Blood:50]  BLOOD ADMINISTERED:none  DRAINS: 28 Straight chest tube right chest   LOCAL MEDICATIONS USED:  NONE  SPECIMEN:  Source of Specimen:  Blebs x 2 from [facility]  DISPOSITION OF SPECIMEN:  PATHOLOGY  COUNTS:  YES  TOURNIQUET:  * No tourniquets in log *  DICTATION: .Dragon Dictation  PLAN OF CARE: Admit to inpatient   PATIENT DISPOSITION:  PACU - hemodynamically stable.   Delay start of Pharmacological VTE agent (>24hrs) due to surgical blood loss or risk of bleeding: yes

## 2013-06-28 NOTE — Anesthesia Procedure Notes (Addendum)
Procedure Name: Intubation Date/Time: 06/28/2013 11:37 AM Performed by: Carola Frost Pre-anesthesia Checklist: Patient identified, Emergency Drugs available, Suction available, Patient being monitored and Timeout performed Patient Re-evaluated:Patient Re-evaluated prior to inductionOxygen Delivery Method: Circle system utilized Preoxygenation: Pre-oxygenation with 100% oxygen Intubation Type: IV induction Ventilation: Mask ventilation without difficulty Laryngoscope Size: Mac and 4 Grade View: Grade I Tube type: Oral Endobronchial tube: 39 Fr Number of attempts: 1 Airway Equipment and Method: Stylet and Fiberoptic brochoscope Placement Confirmation: ETT inserted through vocal cords under direct vision,  positive ETCO2,  CO2 detector and breath sounds checked- equal and bilateral Tube secured with: Tape Dental Injury: Teeth and Oropharynx as per pre-operative assessment

## 2013-06-28 NOTE — OR Nursing (Signed)
Right VATS with stapling of blebs

## 2013-06-28 NOTE — Progress Notes (Signed)
Report called to [facility] on Short Stay.

## 2013-06-29 ENCOUNTER — Inpatient Hospital Stay (HOSPITAL_COMMUNITY): Payer: Medicare Other

## 2013-06-29 LAB — CBC
HCT: 41.8 % (ref 39.0–52.0)
Hemoglobin: 14.4 g/dL (ref 13.0–17.0)
MCH: 35.8 pg — ABNORMAL HIGH (ref 26.0–34.0)
MCHC: 34.4 g/dL (ref 30.0–36.0)
MCV: 104 fL — ABNORMAL HIGH (ref 78.0–100.0)
Platelets: 262 10*3/uL (ref 150–400)
RBC: 4.02 MIL/uL — AB (ref 4.22–5.81)
RDW: 13.7 % (ref 11.5–15.5)
WBC: 17.7 10*3/uL — ABNORMAL HIGH (ref 4.0–10.5)

## 2013-06-29 LAB — BLOOD GAS, ARTERIAL
Acid-base deficit: 0.8 mmol/L (ref 0.0–2.0)
Bicarbonate: 23.1 mEq/L (ref 20.0–24.0)
DRAWN BY: 41308
O2 CONTENT: 3 L/min
O2 Saturation: 93.9 %
PCO2 ART: 36.3 mmHg (ref 35.0–45.0)
Patient temperature: 98.6
TCO2: 24.2 mmol/L (ref 0–100)
pH, Arterial: 7.42 (ref 7.350–7.450)
pO2, Arterial: 67.6 mmHg — ABNORMAL LOW (ref 80.0–100.0)

## 2013-06-29 LAB — BASIC METABOLIC PANEL
BUN: 13 mg/dL (ref 6–23)
CO2: 22 mEq/L (ref 19–32)
CREATININE: 0.85 mg/dL (ref 0.50–1.35)
Calcium: 8.5 mg/dL (ref 8.4–10.5)
Chloride: 98 mEq/L (ref 96–112)
GFR, EST NON AFRICAN AMERICAN: 78 mL/min — AB (ref >90)
Glucose, Bld: 157 mg/dL — ABNORMAL HIGH (ref 70–99)
POTASSIUM: 3.9 meq/L (ref 3.7–5.3)
Sodium: 134 mEq/L — ABNORMAL LOW (ref 137–147)

## 2013-06-29 MED ORDER — AMIODARONE HCL IN DEXTROSE 360-4.14 MG/200ML-% IV SOLN
60.0000 mg/h | INTRAVENOUS | Status: AC
  Administered 2013-06-30: 60 mg/h via INTRAVENOUS
  Filled 2013-06-29 (×2): qty 200

## 2013-06-29 MED ORDER — AMIODARONE HCL IN DEXTROSE 360-4.14 MG/200ML-% IV SOLN
30.0000 mg/h | INTRAVENOUS | Status: DC
  Administered 2013-06-30 (×2): 30 mg/h via INTRAVENOUS
  Filled 2013-06-29 (×3): qty 200

## 2013-06-29 MED ORDER — AMIODARONE LOAD VIA INFUSION
150.0000 mg | Freq: Once | INTRAVENOUS | Status: AC
  Administered 2013-06-29: 150 mg via INTRAVENOUS
  Filled 2013-06-29: qty 83.34

## 2013-06-29 NOTE — Op Note (Signed)
NAMECOLBEY, WIRTANEN NO.:  [id number]  MEDICAL RECORD NO.:  [id number]  LOCATION:  3S12C                        FACILITY:  [facility]  PHYSICIAN:  Revonda Standard. Roxan Hockey, M.D.DATE OF BIRTH:  1928/03/13  DATE OF PROCEDURE:  06/28/2013 DATE OF DISCHARGE:                              OPERATIVE REPORT   PREOPERATIVE DIAGNOSIS:  Recurrent spontaneous pneumothorax, possible lung nodule.  POSTOPERATIVE DIAGNOSIS:  Recurrent spontaneous pneumothorax, possible lung nodule.  PROCEDURE:  Right video-assisted thoracoscopy, blebectomy, pleural abrasion.  SURGEON:  Revonda Standard. Roxan Hockey, MD  ASSISTANT:  Ellwood Handler, PA.  ANESTHESIA:  General.  FINDINGS:  Large complex of blebs, one of which was ruptured, on the right lower lobe. Diffuse emphysematous disease. Diffusely nodular lungs.  No palpable dominant nodule.  CLINICAL NOTE:  Mr. Kochan is an 80 year old gentleman with severe emphysema.  He had a recurrent right spontaneous pneumothorax requiring a second chest tube placement within a week.  He was advised to undergo surgical removal of blebs and pleural abrasion.  A CT scan done during his past admission showed a possible 1 cm nodule in the right lung. Plan was to resect this if it could be identified intraoperatively.  The Indications, risks, benefits, and alternatives were discussed in detail with the patient.  He understood and accepted the risks and agreed to proceed.  OPERATIVE NOTE:  Mr. Gutman was brought to the operating room on June 28, 2013.  He had induction of general anesthesia, and placement of a double-lumen endotracheal tube.  Intravenous antibiotics were administered.  A Foley catheter was placed.  Sequential compression devices were placed on the legs for DVT prophylaxis.  He was placed in a left lateral decubitus position and the single-lung ventilation of the left lung was initiated and was tolerated well throughout the procedure. The  chest tube was removed and the right chest was prepped and draped in usual sterile fashion.    An incision was made in the midaxillary line in approximately the seventh intercostal space, and was carried through the skin and subcutaneous tissue.  A 5-mm port was placed into the chest and the thoracoscope was advanced through the port.  There was good isolation of the right lung with no cross ventilation.  A small working incision was made in the fifth intercostal space, anterolaterally.  No rib spreading was performed.  The lung was inspected.  There were no adhesions to the parietal pleura.  There were some adhesions in the fissures.  There was a large bleb with an adjacent large ruptured bleb along the medial aspect of the lower lobe.  The entire lung was diffusely emphysematous.  There was another prominent bleb along the lateral edge of the left lower lobe near the confluence of the major and minor fissures.  The lung was retracted and pulled towards the incision to allow palpation.  No dominant pulmonary nodule could be palpated.  There was a diffuse nodular character to the lung, but no palpable nodule correlating with the CT findings.  The complex of the large bleb and adjacent ruptured bleb was resected with sequential firings of an endoscopic GIA stapler. Purple staple cartridges with staple line reinforcement were used.  The specimen was sent  for permanent pathology. Next, the smaller bleb along the fissure was removed, this was done with sequential firings of a tan cartridge endoscopic stapler.  A final inspection was made, and there was no palpable dominant nodule.  The pleural surface was lightly abraded with a scratch pad to promote adhesion formation.  A 28-French chest tube was placed through the original port incision and secured with #1 silk sutures.  The right lung was reinflated.  There were no additional visible isolated blebs, although there was diffuse emphysematous  change in the lung.  The scope was removed.  The working incision was closed with a running #1 Vicryl fascial suture, a 2-0 Vicryl subcutaneous suture, and a 3-0 Vicryl subcuticular suture.  All sponge, needle, and instrument counts were correct at the end of the procedure.  There were no intraoperative complications.  The patient was taken from the operating room to the postanesthetic care unit in good condition.     Revonda Standard Roxan Hockey, M.D.     SCH/MEDQ  D:  06/28/2013  T:  06/29/2013  Job:  [id number]

## 2013-06-29 NOTE — Evaluation (Signed)
Physical Therapy Evaluation Patient Details Name: [name] MRN: [id number] DOB: [birth year] Today's Date: 06/29/2013   History of Present Illness  Admitted with SOB, Recurrent R pnemothorax, s/p VATS for abrasion and blebectomy.  Clinical Impression  Pt admitted with/for above problems.  Pt currently limited functionally due to the problems listed below.  (see problems list.)  Pt will benefit from PT to maximize function and safety to be able to get home safely with available assist of family.     Follow Up Recommendations Home health PT;Supervision for mobility/OOB    Equipment Recommendations  None recommended by PT    Recommendations for Other Services       Precautions / Restrictions Precautions Precautions: Fall Precaution Comments: 2-3 L O2 [place]      Mobility  Bed Mobility Overal bed mobility: Needs Assistance Bed Mobility: Supine to Sit;Sit to Supine     Supine to sit: Min assist Sit to supine: Min guard   General bed mobility comments: cues for hand placement, mild truncal assist up due to fatigue  Transfers Overall transfer level: Needs assistance   Transfers: Sit to/from Stand Sit to Stand: Min assist         General transfer comment: mild lift assist, cues for hand placement  Ambulation/Gait Ambulation/Gait assistance: Min guard Ambulation Distance (Feet): 90 Feet (time 2 pushing w/c) Assistive device:  (pushing w/c) Gait Pattern/deviations: Step-through pattern;Trunk flexed Gait velocity: slow   General Gait Details: gait slow and guarded.  Quick to fatigue and begin to get dyspneic.  Sats consistently dropping to upper 80's.  Low of 79% on 3L [place].  EHR up to 110's  Stairs            Wheelchair Mobility    Modified Rankin (Stroke Patients Only)       Balance Overall balance assessment: Needs assistance Sitting-balance support: No upper extremity supported Sitting balance-Leahy Scale: Good       Standing balance-Leahy  Scale: Poor                               Pertinent Vitals/Pain Sats on 3LNC during ambulation and immediately after dropped consistently into the upper 80's and lowest to 79%.  With efficient breathing sats rose to 90-92% on 3L.  EHR 110's    Home Living Family/patient expects to be discharged to:: Private residence Living Arrangements: Spouse/significant other Available Help at Discharge: Family;Available 24 hours/day Type of Home: House Home Access: Stairs to enter Entrance Stairs-Rails: Psychiatric nurse of Steps: 3 Home Layout: One level Home Equipment: None      Prior Function Level of Independence: Independent               Hand Dominance        Extremity/Trunk Assessment   Upper Extremity Assessment: Defer to OT evaluation           Lower Extremity Assessment: Overall WFL for tasks assessed;Generalized weakness (bilaterally)         Communication   Communication: No difficulties  Cognition Arousal/Alertness: Awake/alert Behavior During Therapy: WFL for tasks assessed/performed Overall Cognitive Status: Within Functional Limits for tasks assessed                      General Comments      Exercises        Assessment/Plan    PT Assessment Patient needs continued PT services  PT Diagnosis Generalized weakness;Other (  comment) (decreased activity tolerance)   PT Problem List Decreased strength;Decreased activity tolerance;Decreased mobility;Decreased balance;Cardiopulmonary status limiting activity  PT Treatment Interventions Gait training;Stair training;Functional mobility training;Therapeutic activities;Patient/family education;Balance training   PT Goals (Current goals can be found in the Care Plan section) Acute Rehab PT Goals Patient Stated Goal: back to my independent level PT Goal Formulation: With patient Time For Goal Achievement: 07/13/13 Potential to Achieve Goals: Good    Frequency Min  3X/week   Barriers to discharge        Co-evaluation               End of Session Equipment Utilized During Treatment: Oxygen Activity Tolerance: Patient tolerated treatment well;Patient limited by fatigue Patient left: in bed;with call bell/phone within reach;with family/visitor present Nurse Communication: Mobility status         Time: 7955-8316 PT Time Calculation (min): 23 min   Charges:   PT Evaluation $Initial PT Evaluation Tier I: 1 Procedure PT Treatments $Gait Training: 8-22 mins   PT G Codes:          Hamsa Laurich, Tessie Fass 06/29/2013, 12:24 PM  06/29/2013  Donnella Sham, [place] [phone]  (pager)

## 2013-06-29 NOTE — Progress Notes (Addendum)
[address]       [place] [address]             [phone]       1 Day Post-Op Procedure(s) (LRB): VIDEO ASSISTED THORACOSCOPY with stapling of blebs (Right)  Subjective: Patient is eating lunch. Has incisional/chest tube pain.  Objective: Vital signs in last 24 hours: Temp:  [96.4 F (35.8 C)-98.7 F (37.1 C)] 98.7 F (37.1 C) (06/13 0400) Pulse Rate:  [80-105] 89 (06/13 0400) Cardiac Rhythm:  [-] Normal sinus rhythm (06/13 0402) Resp:  [15-30] 20 (06/13 0402) BP: (116-168)/(52-115) 129/61 mmHg (06/13 0400) SpO2:  [90 %-98 %] 95 % (06/13 0402) Arterial Line BP: (135-193)/(52-86) 164/63 mmHg (06/13 0400)      Intake/Output from previous day: 06/12 0701 - 06/13 0700 In: 1250 [P.O.:180; I.V.:1070] Out: 1455 [Urine:1225; Blood:50; Chest Tube:180]   Physical Exam:  Cardiovascular: IRRR Pulmonary: Coarse on right; no rales, wheezes, or rhonchi. Abdomen: Soft, non tender, bowel sounds present. Extremities: No lower extremity edema. Wounds: Dressing is clean and dry.  Chest Tube: to suction and no air leak  Lab Results: CBC: Recent Labs  06/27/13 1656 06/29/13 0420  WBC 11.7* 17.7*  HGB 15.2 14.4  HCT 43.9 41.8  PLT 270 262   BMET:  Recent Labs  06/27/13 1656 06/29/13 0420  NA 142 134*  K 4.8 3.9  CL 105 98  CO2 25 22  GLUCOSE 114* 157*  BUN 19 13  CREATININE 1.12 0.85  CALCIUM 9.1 8.5    PT/INR:  Recent Labs  06/27/13 1656  LABPROT 13.9  INR 1.09   ABG:  INR: Will add last result for INR, ABG once components are confirmed Will add last 4 CBG results once components are confirmed  Assessment/Plan:  1. CV - A fib in the 100's. On Xarelto. 2.  Pulmonary - Chest tube with 210 cc since surgery. Chest tube is to suction. There is no air leak. CXR shows questionable trace right apical pneumothorax, atelectasis at bases R>L. Encourage incentive spirometer and flutter valve. Possibly place CT to water seal.  3. Remove a  line 4. Decrease IVF 5. Remove foley in am  ZIMMERMAN,DONIELLE MPA-C 06/29/2013,6:11 AM I have seen and examined [name] and agree with the above assessment  and plan.  Grace Isaac MD Beeper [phone] Office [phone] 06/29/2013 5:08 PM

## 2013-06-30 ENCOUNTER — Inpatient Hospital Stay (HOSPITAL_COMMUNITY): Payer: Medicare Other

## 2013-06-30 DIAGNOSIS — I4891 Unspecified atrial fibrillation: Secondary | ICD-10-CM

## 2013-06-30 DIAGNOSIS — J9383 Other pneumothorax: Principal | ICD-10-CM

## 2013-06-30 DIAGNOSIS — I2510 Atherosclerotic heart disease of native coronary artery without angina pectoris: Secondary | ICD-10-CM

## 2013-06-30 LAB — COMPREHENSIVE METABOLIC PANEL
ALBUMIN: 2.7 g/dL — AB (ref 3.5–5.2)
ALT: 8 U/L (ref 0–53)
AST: 14 U/L (ref 0–37)
Alkaline Phosphatase: 53 U/L (ref 39–117)
BILIRUBIN TOTAL: 0.4 mg/dL (ref 0.3–1.2)
BUN: 14 mg/dL (ref 6–23)
CHLORIDE: 97 meq/L (ref 96–112)
CO2: 23 mEq/L (ref 19–32)
CREATININE: 0.87 mg/dL (ref 0.50–1.35)
Calcium: 8.5 mg/dL (ref 8.4–10.5)
GFR calc Af Amer: 90 mL/min — ABNORMAL LOW (ref >90)
GFR calc non Af Amer: 78 mL/min — ABNORMAL LOW (ref >90)
Glucose, Bld: 124 mg/dL — ABNORMAL HIGH (ref 70–99)
Potassium: 3.8 mEq/L (ref 3.7–5.3)
Sodium: 133 mEq/L — ABNORMAL LOW (ref 137–147)
TOTAL PROTEIN: 6 g/dL (ref 6.0–8.3)

## 2013-06-30 LAB — CBC
HCT: 38.9 % — ABNORMAL LOW (ref 39.0–52.0)
Hemoglobin: 13.4 g/dL (ref 13.0–17.0)
MCH: 35.4 pg — ABNORMAL HIGH (ref 26.0–34.0)
MCHC: 34.4 g/dL (ref 30.0–36.0)
MCV: 102.6 fL — AB (ref 78.0–100.0)
PLATELETS: 226 10*3/uL (ref 150–400)
RBC: 3.79 MIL/uL — ABNORMAL LOW (ref 4.22–5.81)
RDW: 13.4 % (ref 11.5–15.5)
WBC: 14 10*3/uL — AB (ref 4.0–10.5)

## 2013-06-30 MED ORDER — SODIUM CHLORIDE 0.9 % IV BOLUS (SEPSIS)
250.0000 mL | Freq: Once | INTRAVENOUS | Status: AC
  Administered 2013-06-30: 250 mL via INTRAVENOUS

## 2013-06-30 MED ORDER — AMIODARONE HCL 200 MG PO TABS
400.0000 mg | ORAL_TABLET | Freq: Two times a day (BID) | ORAL | Status: DC
  Administered 2013-06-30 – 2013-07-02 (×5): 400 mg via ORAL
  Filled 2013-06-30 (×8): qty 2

## 2013-06-30 MED ORDER — POTASSIUM CHLORIDE CRYS ER 20 MEQ PO TBCR
20.0000 meq | EXTENDED_RELEASE_TABLET | Freq: Once | ORAL | Status: AC
  Administered 2013-06-30: 20 meq via ORAL
  Filled 2013-06-30: qty 1

## 2013-06-30 NOTE — Consult Note (Signed)
Admit date: 06/27/2013 Referring Physician  Dr. Servando Snare Primary Cardiologist  None Reason for Consultation  Afib with RVR  HPI: This is an 80 year old Caucasian male with a past medical history of right spontaneuous pneumothorax (discharged from [facility] 06/25/2013), tobacco abuse, and COPD who after using the bathroom became profoundly short of breath. He denied any chest pain, pleuritic pain, nausea, or diaphoresis. He presented to [facility] Emergency Department. CXR done showed a 40% right, recurrent spontaneous pneumothorax. Dr. Prescott Gum wasconsulted to place a right chest tube and admit. On 6/13 he underwent VATS with blebectomy and pleural abrasion. He has a history of atrial fibrillation after PTX in 2012.  Apparently per Dr. Carrie Mew note from this evening he was in afib with HR around 100's but now is in rapid afib.  Dr. Servando Snare started IV Amio but requested a Cardiology consult.      PMH:   Past Medical History  Diagnosis Date  . Hyperlipidemia   . Spontaneous pneumothorax 09/04/2010    [name]  . Erectile dysfunction   . AF (atrial fibrillation)     after PTX 2012  . Coronary artery disease   . HSV-1 (herpes simplex virus 1) infection   . Elevated MCV   . Allergy   . GERD (gastroesophageal reflux disease)   . Arthritis   . BPH (benign prostatic hyperplasia)   . Back pain   . COPD (chronic obstructive pulmonary disease)      PSH:   Past Surgical History  Procedure Laterality Date  . Chest tube placement  09/04/2010  . Video assisted thoracoscopy  12/15/2010    Procedure: VIDEO ASSISTED THORACOSCOPY;  Surgeon: Melrose Nakayama, MD;  Location: [facility];  Service: Thoracic;  Laterality: Left;  blebectomy with pleural abrasion    Allergies:  Blistex and Rosuvastatin Prior to Admit Meds:   Prescriptions prior to admission  Medication Sig Dispense Refill  . ANORO ELLIPTA 62.5-25 MCG/INH AEPB Inhale 2 puffs into the lungs daily.      . beta carotene w/minerals  (OCUVITE) tablet Take 1 tablet by mouth daily.        Marland Kitchen docusate sodium (COLACE) 100 MG capsule Take 100 mg by mouth daily.      Marland Kitchen loratadine (CLARITIN) 10 MG tablet Take 10 mg by mouth daily as needed for allergies.       Marland Kitchen omeprazole (PRILOSEC) 20 MG capsule Take 20 mg by mouth daily.        . Rivaroxaban (XARELTO) 15 MG TABS tablet Take 15 mg by mouth every morning.      . tamsulosin (FLOMAX) 0.4 MG CAPS capsule Take 0.4 mg by mouth daily as needed (frequemcy).      Marland Kitchen tiotropium (SPIRIVA) 18 MCG inhalation capsule Place 1 capsule (18 mcg total) into inhaler and inhale daily.  30 capsule  6  . traMADol (ULTRAM) 50 MG tablet Take 1 tablet (50 mg total) by mouth every 6 (six) hours as needed for moderate pain.  30 tablet  0  . traZODone (DESYREL) 100 MG tablet Take 200 mg by mouth at bedtime.      Marland Kitchen albuterol (PROAIR HFA) 108 (90 BASE) MCG/ACT inhaler Inhale 2 puffs into the lungs every 4 (four) hours as needed for wheezing or shortness of breath.  1 Inhaler  2   Fam HX:    Family History  Problem Relation Age of Onset  . Diabetes Mother   . Prostate cancer Neg Hx   . Colon cancer Brother  possible dx, not certain  . Peripheral Artery Disease Father    Social HX:    History   Social History  . Marital Status: Married    Spouse Name: N/A    Number of Children: N/A  . Years of Education: N/A   Occupational History  . Not on file.   Social History Main Topics  . Smoking status: Former Smoker -- 1.00 packs/day for 50 years    Types: Cigarettes    Quit date: 04/14/1999  . Smokeless tobacco: Not on file  . Alcohol Use: No     Comment: h/o use  . Drug Use: No  . Sexual Activity: Yes   Other Topics Concern  . Not on file   Social History Narrative   Occupation:Retired [employer]   Married 1957   Lives with wife    2 Children     [employer]ast [employer]     ROS:  All 11 ROS were addressed and are negative except what is stated in the HPI  Physical Exam: Blood pressure  98/61, pulse 145, temperature 98.2 F (36.8 C), temperature source Oral, resp. rate 25, height _0  (1.778 m), weight 162 lb 0.6 oz (73.5 kg), SpO2 91.00%.    General: Well developed, well nourished, in no acute distress Head: Eyes PERRLA, No xanthomas.   Normal cephalic and atramatic  Lungs:   Clear bilaterally to auscultation and percussion. Heart:   Irregularly irregular and tachy S1 S2 Pulses are 2+ & equal.            No carotid bruit. No JVD.  No abdominal bruits. No femoral bruits. Abdomen: Bowel sounds are positive, abdomen soft and non-tender without masses Extremities:   No clubbing, cyanosis or edema.  DP +1 Neuro: Alert and oriented X 3. Psych:  Good affect, responds appropriately    Labs:   Lab Results  Component Value Date   WBC 17.7* 06/29/2013   HGB 14.4 06/29/2013   HCT 41.8 06/29/2013   MCV 104.0* 06/29/2013   PLT 262 06/29/2013    Recent Labs Lab 06/27/13 1656 06/29/13 0420  NA 142 134*  K 4.8 3.9  CL 105 98  CO2 25 22  BUN 19 13  CREATININE 1.12 0.85  CALCIUM 9.1 8.5  PROT 7.2  --   BILITOT 0.5  --   ALKPHOS 72  --   ALT 10  --   AST 17  --   GLUCOSE 114* 157*   No results found for this basename: PTT   Lab Results  Component Value Date   INR 1.09 06/27/2013   INR 1.05 06/27/2013   INR 1.06 12/14/2010   No results found for this basename: CKTOTAL, CKMB, CKMBINDEX, TROPONINI     Lab Results  Component Value Date   CHOL 206* 06/18/2013   CHOL 198 05/23/2012   CHOL 201* 04/18/2011   Lab Results  Component Value Date   HDL 34.70* 06/18/2013   HDL 33.70* 05/23/2012   HDL 35.30* 04/18/2011   Lab Results  Component Value Date   LDLCALC 137* 06/18/2013   LDLCALC 133* 05/23/2012   LDLCALC 126* 09/16/2010   Lab Results  Component Value Date   TRIG 173.0* 06/18/2013   TRIG 157.0* 05/23/2012   TRIG 196.0* 04/18/2011   Lab Results  Component Value Date   CHOLHDL 6 06/18/2013   CHOLHDL 6 05/23/2012   CHOLHDL 6 04/18/2011   Lab Results  Component Value Date    LDLDIRECT 144.6 04/18/2011  LDLDIRECT 146.2 03/15/2010   LDLDIRECT 93.0 02/03/2009      Radiology:  Dg Chest Port 1 View  06/29/2013   CLINICAL DATA:  Followup chest tube  EXAM: PORTABLE CHEST - 1 VIEW  COMPARISON:  06/28/2013  FINDINGS: There is a a right IJ catheter with tip in the projection of the cavoatrial junction. Right-sided chest tube in place. There is no change in tiny right apical pneumothorax. Increased in right midlung opacity. Bibasilar opacities are unchanged from previous exam.  IMPRESSION: 1. Stable right chest tube with tiny right apical pneumothorax. 2. Increase and right midlung opacity.   Electronically Signed   By: Kerby Moors M.D.   On: 06/29/2013 07:55   Dg Chest Port 1 View  06/28/2013   CLINICAL DATA:  Central line placement and status post VATS  EXAM: PORTABLE CHEST - 1 VIEW  COMPARISON:  06/28/2013  FINDINGS: The heart is mildly enlarged but stable. There is tortuosity and calcification of the thoracic aorta. The right sided chest tube has been retracted slightly. No definite pneumothorax. The right IJ catheter tip is in the right atrium. It could be retracted 3 cm. Slight increase in bibasilar atelectasis mainly due to lower lung volumes.  IMPRESSION: 1. Right IJ central venous catheter tip is in the right atrium and could be retracted approximately 3 cm. 2. Slight increase in bibasilar atelectasis due to lower lung volumes. 3. Right-sided chest tube has been retracted slightly. No pneumothorax.   Electronically Signed   By: Kalman Jewels M.D.   On: 06/28/2013 14:07   Portable Chest 1 View  06/28/2013   CLINICAL DATA:  Right-sided pneumothorax.  EXAM: PORTABLE CHEST - 1 VIEW  COMPARISON:  Chest x-ray 06/27/2013.  FINDINGS: Lung volumes are low. Irregular opacities at the left base favored to reflect subsegmental atelectasis. Small left pleural effusion. Right-sided chest tube in position with tip in the apex of the right hemithorax. Trace right apical pneumothorax noted  (1-2% of the volume of the right hemithorax). Small amount of gas in the right chest wall adjacent to the chest tube. No definite consolidative airspace disease. No evidence of pulmonary edema. Heart size is normal. Mediastinal contours are unremarkable. Atherosclerosis in the thoracic aorta.  IMPRESSION: 1. Allowing for slight differences in patient positioning, the radiographic appearance of the chest is essentially unchanged, with a small right-sided pneumothorax (approximately 1-2% of the volume of the right hemithorax), as above.   Electronically Signed   By: Vinnie Langton M.D.   On: 06/28/2013 08:31    EKG:  Atrial fibrillation with RVR  ASSESSMENT:  1.  Atrial fibrillation with RVR  2.  Spontaneous PTX s/p VATS with blebectomy and pleural abrasion 3.  CAD 4.  COPD  PLAN:   1.  Continue IV Amio gtt for rate control as BP tolerates  Sueanne Margarita, MD  06/30/2013  1:08 AM

## 2013-06-30 NOTE — Progress Notes (Addendum)
[address]       [place], [address]             [phone]       2 Days Post-Op Procedure(s) (LRB): VIDEO ASSISTED THORACOSCOPY with stapling of blebs (Right)  Subjective: Patient passing flatus but no bowel movement yet. Does not want laxative yet  Objective: Vital signs in last 24 hours: Temp:  [98.1 F (36.7 C)-98.8 F (37.1 C)] 98.8 F (37.1 C) (06/14 0700) Pulse Rate:  [80-145] 120 (06/14 0700) Cardiac Rhythm:  [-] Atrial fibrillation (06/14 0800) Resp:  [18-31] 20 (06/14 0813) BP: (79-161)/(47-85) 107/66 mmHg (06/14 0700) SpO2:  [90 %-94 %] 93 % (06/14 0813)      Intake/Output from previous day: 06/13 0701 - 06/14 0700 In: 901.4 [I.V.:801.4; IV Piggyback:100] Out: 1600 [Urine:1350; Chest Tube:250]   Physical Exam:  Cardiovascular:RRR Pulmonary: Mostly clear; no rales, wheezes, or rhonchi. Abdomen: Soft, non tender, bowel sounds present. Extremities: No lower extremity edema. Wounds: Dressing is clean and dry.  Chest Tube: to suction and no air leak  Lab Results: CBC:  Recent Labs  06/29/13 0420 06/30/13 0430  WBC 17.7* 14.0*  HGB 14.4 13.4  HCT 41.8 38.9*  PLT 262 226   BMET:   Recent Labs  06/29/13 0420 06/30/13 0430  NA 134* 133*  K 3.9 3.8  CL 98 97  CO2 22 23  GLUCOSE 157* 124*  BUN 13 14  CREATININE 0.85 0.87  CALCIUM 8.5 8.5    PT/INR:   Recent Labs  06/27/13 1656  LABPROT 13.9  INR 1.09   ABG:  INR: Will add last result for INR, ABG once components are confirmed Will add last 4 CBG results once components are confirmed  Assessment/Plan:  1. CV - A fib with RVR yesterday. Has a history of a fib with CVR. Converted to SR this am.Cardiology has evaluated.On Amiodarone drip and Xarelto. Per cardiology. 2.  Pulmonary - Chest tube with 250 cc since surgery. Chest tube is to suction. There is no air leak. Place chest tube to water seal. CXR shows no pneumothorax, atelectasis on left. Encourage  incentive spirometer and flutter valve. Check CXR in am   ZIMMERMAN,DONIELLE MPA-C 06/30/2013,9:11 AM   Back in sinus now, chest tube to water seal today, poss remove tomorrow I have seen and examined [name] and agree with the above assessment  and plan.  Grace Isaac MD Beeper [phone] Office [phone] 06/30/2013 11:42 AM

## 2013-06-30 NOTE — Progress Notes (Signed)
Pt heart Rate 130's-140 A-fib. Dr Servando Snare returned page and gave orders (see mar). Orders followed. Will continue to monitor Pt.

## 2013-06-30 NOTE — Progress Notes (Signed)
Patient ID: [name] [facility], male   DOB: [birth year], 80 y.o.   MRN: [id number]  Cardiology consult was done early today by Dr. Radford Pax. The patient has converted to sinus rhythm.  Daryel November, MD

## 2013-06-30 NOTE — Progress Notes (Signed)
Pt Bp 79/55. Dr. Radford Pax paged and returned page. Orders given ( see mar). Orders followed. Will continue to monitor pt.

## 2013-07-01 ENCOUNTER — Inpatient Hospital Stay (HOSPITAL_COMMUNITY): Payer: Medicare Other

## 2013-07-01 ENCOUNTER — Encounter (HOSPITAL_COMMUNITY): Payer: Self-pay | Admitting: Thoracic Surgery (Cardiothoracic Vascular Surgery)

## 2013-07-01 DIAGNOSIS — I4891 Unspecified atrial fibrillation: Secondary | ICD-10-CM

## 2013-07-01 MED ORDER — METOPROLOL TARTRATE 12.5 MG HALF TABLET
12.5000 mg | ORAL_TABLET | Freq: Two times a day (BID) | ORAL | Status: DC
  Administered 2013-07-01 – 2013-07-02 (×2): 12.5 mg via ORAL
  Filled 2013-07-01 (×7): qty 1

## 2013-07-01 NOTE — Anesthesia Postprocedure Evaluation (Signed)
Anesthesia Post Note  Patient: [name]  Procedure(s) Performed: Procedure(s) (LRB): VIDEO ASSISTED THORACOSCOPY with stapling of blebs (Right)  Anesthesia type: general  Patient location: PACU  Post pain: Pain level controlled  Post assessment: Patient's Cardiovascular Status Stable  Last Vitals:  Filed Vitals:   07/01/13 1500  BP:   Pulse:   Temp: 36.7 C  Resp:     Post vital signs: Reviewed and stable  Level of consciousness: sedated  Complications: No apparent anesthesia complications

## 2013-07-01 NOTE — Progress Notes (Signed)
Physical Therapy Treatment Patient Details Name: [name] MRN: [id number] DOB: 01-24-1928 Today's Date: 07/01/2013    History of Present Illness Admitted with SOB, Recurrent R pnemothorax, s/p VATS for abrasion and blebectomy.    PT Comments    Pt ambulated ~150 ft pushing manual w/c for bil. UE support and min guard for safety.  Pt began walking on 2L [place] but repeatedly had O2 sats drop to 88-89%, so O2 was increased to 3L [place] for remainder of walk with frequent standing rests to maintain O2 sats at or above 92%.  Pt reports that he does not feel any different when his O2 sats were lower.  Pt required no physical assist with any mobility or transfers today.  Will continue to follow.   Follow Up Recommendations  Home health PT;Supervision for mobility/OOB     Equipment Recommendations  None recommended by PT    Recommendations for Other Services       Precautions / Restrictions Precautions Precautions: Fall Precaution Comments: 2-3 L O2 [place] Restrictions Weight Bearing Restrictions: No    Mobility  Bed Mobility Overal bed mobility: Needs Assistance Bed Mobility: Supine to Sit     Supine to sit: Supervision;HOB elevated     General bed mobility comments: VC for hand placement and use of bed rails, no physical assist necessary.  Transfers Overall transfer level: Needs assistance Equipment used: None Transfers: Sit to/from Stand Sit to Stand: Min guard         General transfer comment: No physical assistance needed, VC for hand placement and to scoot to edge of seat before trying to stand.  Ambulation/Gait Ambulation/Gait assistance: Min guard Ambulation Distance (Feet): 150 Feet Assistive device:  (Pushing manual w/c) Gait Pattern/deviations: Trunk flexed;Step-through pattern     General Gait Details: O2 sats during ambulation ranged 88-95%.  Standing rests with focus on deep breathing were performed when O2 sats dropped below 91.  Pt ambulated first 50  feet on 2L [place], but was bumped up to 3L [place] thereafter to help keep O2 sats above 92.   Stairs            Wheelchair Mobility    Modified Rankin (Stroke Patients Only)       Balance Overall balance assessment: Needs assistance         Standing balance support: During functional activity;No upper extremity supported Standing balance-Leahy Scale: Fair Standing balance comment: Pt able to stand to use urinal with no UE support, but PT provided min guard with gait belt for balance/safety.                    Cognition Arousal/Alertness: Awake/alert Behavior During Therapy: WFL for tasks assessed/performed Overall Cognitive Status: Within Functional Limits for tasks assessed                      Exercises General Exercises - Lower Extremity Ankle Circles/Pumps: AROM;Both;20 reps;Seated    General Comments General comments (skin integrity, edema, etc.): BP upon sitting EOB from supine was 126/61 (HR 78).  Upon standing, BP was 145/64 (HR 83).  After returning to sit post-ambulation, BP 110/63 (HR 73).      Pertinent Vitals/Pain Pt reports no pain today with therapy.  O2 sats ranged 88-95% as described above.  Pt on 2L  while resting in chair with O2 sats at 93% at end of therapy session today.    Home Living  Prior Function            PT Goals (current goals can now be found in the care plan section) Acute Rehab PT Goals Patient Stated Goal: None stated PT Goal Formulation: With patient Time For Goal Achievement: 07/13/13 Potential to Achieve Goals: Good Progress towards PT goals: Progressing toward goals    Frequency  Min 3X/week    PT Plan Current plan remains appropriate    Co-evaluation             End of Session Equipment Utilized During Treatment: Gait belt;Oxygen Activity Tolerance: Patient tolerated treatment well Patient left: in chair;with call bell/phone within reach;with family/visitor  present     Time: 7505-1833 PT Time Calculation (min): 38 min  Charges:                       G Codes:      Britanie Harshman, SPT 07/01/2013, 1:31 PM

## 2013-07-01 NOTE — Progress Notes (Signed)
3 Days Post-Op Procedure(s) (LRB): VIDEO ASSISTED THORACOSCOPY with stapling of blebs (Right) Subjective: Some pain at chest tube site Denies nausea  Objective: Vital signs in last 24 hours: Temp:  [97.8 F (36.6 C)-98.8 F (37.1 C)] 98.4 F (36.9 C) (06/15 0416) Pulse Rate:  [56-68] 68 (06/15 0416) Cardiac Rhythm:  [-] Normal sinus rhythm (06/15 0047) Resp:  [17-25] 22 (06/15 0416) BP: (107-144)/(45-84) 130/63 mmHg (06/15 0416) SpO2:  [90 %-96 %] 92 % (06/15 0416) FiO2 (%):  [28 %] 28 % (06/14 0943)  Hemodynamic parameters for last 24 hours:    Intake/Output from previous day: 06/14 0701 - 06/15 0700 In: 330.8 [I.V.:330.8] Out: 2140 [Urine:2020; Chest Tube:120] Intake/Output this shift:    General appearance: alert and no distress Neurologic: intact Heart: regular rate and rhythm Lungs: diminished breath sounds bilaterally Abdomen: normal findings: soft, non-tender no air leak  Lab Results:  Recent Labs  06/29/13 0420 06/30/13 0430  WBC 17.7* 14.0*  HGB 14.4 13.4  HCT 41.8 38.9*  PLT 262 226   BMET:  Recent Labs  06/29/13 0420 06/30/13 0430  NA 134* 133*  K 3.9 3.8  CL 98 97  CO2 22 23  GLUCOSE 157* 124*  BUN 13 14  CREATININE 0.85 0.87  CALCIUM 8.5 8.5    PT/INR: No results found for this basename: LABPROT, INR,  in the last 72 hours ABG    Component Value Date/Time   PHART 7.420 06/29/2013 0425   HCO3 23.1 06/29/2013 0425   TCO2 24.2 06/29/2013 0425   ACIDBASEDEF 0.8 06/29/2013 0425   O2SAT 93.9 06/29/2013 0425   CBG (last 3)  No results found for this basename: GLUCAP,  in the last 72 hours  Assessment/Plan: S/P Procedure(s) (LRB): VIDEO ASSISTED THORACOSCOPY with stapling of blebs (Right) - POD # 3 Right VATS, blebectomy  No air leak and minimal drainage- dc CT  Atrial fibrillation- in SR on PO amiodarone  On xarelto  Wean O2  Increase mobility   LOS: 4 days    Jeron Grahn C 07/01/2013

## 2013-07-01 NOTE — Progress Notes (Signed)
Patient ID: [name] [facility], male   DOB: 22-Nov-1928, 80 y.o.   MRN: [id number]    Subjective:  Denies SSCP, palpitations or Dyspnea Chest tube just came out so less pain   Objective:  Filed Vitals:   07/01/13 0416 07/01/13 0700 07/01/13 0738 07/01/13 0800  BP: 130/63  113/59   Pulse: 68  54   Temp: 98.4 F (36.9 C)   98.2 F (36.8 C)  TempSrc: Oral   Oral  Resp: _0 Height:      Weight:      SpO2: 92% 93% 92% 92%    Intake/Output from previous day:  Intake/Output Summary (Last 24 hours) at 07/01/13 0946 Last data filed at 07/01/13 5974  Gross per 24 hour  Intake 190.23 ml  Output   2090 ml  Net -1899.77 ml    Physical Exam: Affect appropriate Elderly frail male  HEENT: normal Neck supple with no adenopathy JVP normal no bruits no thyromegaly Lungs clear with no wheezing and good diaphragmatic motion Chest tube side right lung with dressing  Heart:  S1/S2 no murmur, no rub, gallop or click PMI normal Abdomen: benighn, BS positve, no tenderness, no AAA no bruit.  No HSM or HJR Distal pulses intact with no bruits No edema Neuro non-focal Skin warm and dry No muscular weakness   Lab Results: Basic Metabolic Panel:  Recent Labs  06/29/13 0420 06/30/13 0430  NA 134* 133*  K 3.9 3.8  CL 98 97  CO2 22 23  GLUCOSE 157* 124*  BUN 13 14  CREATININE 0.85 0.87  CALCIUM 8.5 8.5   Liver Function Tests:  Recent Labs  06/30/13 0430  AST 14  ALT 8  ALKPHOS 53  BILITOT 0.4  PROT 6.0  ALBUMIN 2.7*   CBC:  Recent Labs  06/29/13 0420 06/30/13 0430  WBC 17.7* 14.0*  HGB 14.4 13.4  HCT 41.8 38.9*  MCV 104.0* 102.6*  PLT 262 226    Imaging: Dg Chest Port 1 View  07/01/2013   CLINICAL DATA:  Chest tube evaluation  EXAM: PORTABLE CHEST - 1 VIEW  COMPARISON:  06/30/2013  FINDINGS: Cardiomediastinal silhouette is stable. Stable right IJ central line position. Right chest tube is unchanged in position. Persistent right basilar streaky atelectasis  or infiltrate. No pneumothorax. Left lung is clear.  IMPRESSION: Stable right chest tube position. Streaky right basilar atelectasis or infiltrate. No pneumothorax.   Electronically Signed   By: Lahoma Crocker M.D.   On: 07/01/2013 08:05   Dg Chest Port 1 View  06/30/2013   CLINICAL DATA:  Evaluate chest tube placement.  EXAM: PORTABLE CHEST - 1 VIEW  COMPARISON:  06/29/2013.  FINDINGS: Right-sided chest tube is identified. No appreciable pneumothorax identified. There is a right IJ catheter with tip in the projection of the cavoatrial junction. The heart size appears normal. There is been slight improved aeration to the right midlung and right base. Stable left base atelectasis.  IMPRESSION: Stable right chest tube.  No appreciable pneumothorax identified.   Electronically Signed   By: Kerby Moors M.D.   On: 06/30/2013 07:53    Cardiac Studies:  ECG:  6/12  Read as flutter but looks like SR with artifact and RBBB    Telemetry:  NSR rates mid 80's   Echo:   Medications:   . acetaminophen  1,000 mg Oral 4 times per day   Or  . acetaminophen (TYLENOL) oral liquid 160 mg/5 mL  1,000 mg Oral 4  times per day  . amiodarone  400 mg Oral BID  . beta carotene w/minerals  1 tablet Oral Daily  . bisacodyl  10 mg Oral Daily  . fentaNYL   Intravenous 6 times per day  . loratadine  10 mg Oral Daily  . pantoprazole  40 mg Oral Daily  . Rivaroxaban  15 mg Oral Q supper  . senna-docusate  1 tablet Oral QHS  . tamsulosin  0.4 mg Oral Daily  . tiotropium  18 mcg Inhalation Daily  . traZODone  100 mg Oral QHS  . Umeclidinium-Vilanterol  2 puff Inhalation Daily     . dextrose 5 % and 0.45% NaCl 10 mL/hr at 06/30/13 1310    Assessment/Plan:  PAF:  Maint NSR on amiodarone  Continue for 24-48hrs  Add low dose beta blocker to blunt adrenergic tone since he is still having PAC;s on my exam Pneumothorax  Chest tube out this am  F/u CXR plan per CVTS  [name] 07/01/2013, 9:46 AM

## 2013-07-01 NOTE — Progress Notes (Signed)
Agree with SPT.    [place], [place]

## 2013-07-01 NOTE — Care Management Note (Addendum)
Page 1 of 2   07/03/2013     2:19:49 PM CARE MANAGEMENT NOTE 07/03/2013  Patient:  [name], [name]   Account Number:  [id number]  Date Initiated:  06/28/2013  Documentation initiated by:  Marvetta Gibbons  Subjective/Objective Assessment:   Pt admitted with recurrent spont. pntx- plan for OR today for VATS     Action/Plan:   PTA pt lived at home  PT eval   Anticipated DC Date:  07/02/2013   Anticipated DC Plan:  [place]  CM consult      [place]   Choice offered to / List presented to:  C-1 Patient   DME arranged  [place]      DME agency  [place] arranged  [place].   Status of service:  Completed, signed off Medicare Important Message given?  YES (If response is "NO", the following Medicare IM given date fields will be blank) Date Medicare IM given:  07/03/2013 Date Additional Medicare IM given:    Discharge Disposition:  [place]  Per UR Regulation:  Reviewed for med. necessity/level of care/duration of stay  If discussed at [place] of Stay Meetings, dates discussed:   07/02/2013    Comments:  07/03/13 Ellan Lambert, RN, BSN [phone] Pt for dc home today.  Referral to [facility], per pt/wife choice. Start of care 24-48h post dc date.  Pt will need home oxygen at dc, as cont to desat with ambulation.  Also needs rollator for home.  Referral to [facility] for DME needs.  07/01/13- 1400- Marvetta Gibbons RN, BSN [phone] Spoke with pt and wife at bedside regarding d/c needs, - discussed with pt PT recommendations for HH-PT at discharge- pt and wife agreeable- [place] list for [facility] given to wife to review for choice- pt still with 02- will follow for potential need- MD please order HH-PT for discharge- CM to f/u for [facility] agency of choice and any other d/c needs.

## 2013-07-02 ENCOUNTER — Inpatient Hospital Stay (HOSPITAL_COMMUNITY): Payer: Medicare Other

## 2013-07-02 NOTE — Progress Notes (Signed)
Pt can sit upright at 1125. Time wrote on whiteboard for [place], [place].

## 2013-07-02 NOTE — Progress Notes (Addendum)
Pt transferred to 2W with all belongings. RIJ removal education done with Sharyn Lull , Therapist, sports. Instructed Sharyn Lull about pt needing to lay flat in [place] position for 30 minutes. [place], [place] stated that it was okay to leave PIV in right forearm in.

## 2013-07-02 NOTE — Progress Notes (Signed)
SATURATION QUALIFICATIONS: (This note is used to comply with regulatory documentation for home oxygen)  Patient Saturations on Room Air at Rest = 87%  Patient Saturations on Room Air while Ambulating = 82%  Patient Saturations on 2 Liters of oxygen while Ambulating = 90%  Please briefly explain why patient needs home oxygen:Pt Saturations dropped with ambulation.

## 2013-07-02 NOTE — Progress Notes (Signed)
Patient ID: [name] [facility], male   DOB: 1928/04/27, 80 y.o.   MRN: [id number]    Subjective:  Feels stronger out of chair and walking  Occasional PAC;s on telemetry no PAF  Objective:  Filed Vitals:   07/02/13 0703 07/02/13 0752 07/02/13 0818 07/02/13 0926  BP: 101/50   106/41  Pulse: 73   74  Temp:  98 F (36.7 C)    TempSrc:  Oral    Resp: 18   16  Height:      Weight:      SpO2: 93%  94% 94%    Intake/Output from previous day:  Intake/Output Summary (Last 24 hours) at 07/02/13 0944 Last data filed at 07/02/13 7824  Gross per 24 hour  Intake  983.5 ml  Output    651 ml  Net  332.5 ml    Physical Exam: Affect appropriate Elderly frail male  HEENT: normal Neck supple with no adenopathy JVP normal no bruits no thyromegaly Lungs clear with no wheezing and good diaphragmatic motion Chest tube side right lung with dressing  Heart:  S1/S2 no murmur, no rub, gallop or click PMI normal Abdomen: benighn, BS positve, no tenderness, no AAA no bruit.  No HSM or HJR Distal pulses intact with no bruits No edema Neuro non-focal Skin warm and dry No muscular weakness   Lab Results: Basic Metabolic Panel:  Recent Labs  06/30/13 0430  NA 133*  K 3.8  CL 97  CO2 23  GLUCOSE 124*  BUN 14  CREATININE 0.87  CALCIUM 8.5   Liver Function Tests:  Recent Labs  06/30/13 0430  AST 14  ALT 8  ALKPHOS 53  BILITOT 0.4  PROT 6.0  ALBUMIN 2.7*   CBC:  Recent Labs  06/30/13 0430  WBC 14.0*  HGB 13.4  HCT 38.9*  MCV 102.6*  PLT 226    Imaging: Dg Chest 2 View  07/02/2013   CLINICAL DATA:  Postop bleb resection  EXAM: CHEST  2 VIEW  COMPARISON:  07/01/2013  FINDINGS: Right central line tip is in the SVC. There is hyperinflation of the lungs compatible with COPD. Small right apical pneumothorax. Coarsened bibasilar opacities bilaterally, right greater than left, likely prominent scarring or atelectasis. Small bilateral pleural effusions. Heart is normal size.   IMPRESSION: Small right apical thorax.  Bibasilar scarring and/or atelectasis and small effusions.  COPD.   Electronically Signed   By: Rolm Baptise M.D.   On: 07/02/2013 08:15   Dg Chest 1v Repeat Same Day  07/01/2013   CLINICAL DATA:  CT removal, r/o pneumo  EXAM: CHEST - 1 VIEW SAME DAY  COMPARISON:  DG CHEST 1V PORT dated 07/01/2013  FINDINGS: Low lung volumes. Cardiomediastinal silhouette stable. Atherosclerotic calcifications within the aorta. The right chest tube has been removed. No appreciable pneumothorax. A right internal jugular catheter tip projecting at the level superior vena cava. The lung parenchyma otherwise stable with areas of atelectasis and/or scarring in the lung bases. Stable postsurgical changes in the right chest. No acute osseous abnormalities.  IMPRESSION: Interval removal of the right-sided chest tube. No evidence pneumothorax.  Chest otherwise stable.   Electronically Signed   By: Margaree Mackintosh M.D.   On: 07/01/2013 11:11   Dg Chest Port 1 View  07/01/2013   CLINICAL DATA:  Chest tube evaluation  EXAM: PORTABLE CHEST - 1 VIEW  COMPARISON:  06/30/2013  FINDINGS: Cardiomediastinal silhouette is stable. Stable right IJ central line position. Right chest tube is unchanged  in position. Persistent right basilar streaky atelectasis or infiltrate. No pneumothorax. Left lung is clear.  IMPRESSION: Stable right chest tube position. Streaky right basilar atelectasis or infiltrate. No pneumothorax.   Electronically Signed   By: Lahoma Crocker M.D.   On: 07/01/2013 08:05    Cardiac Studies:  ECG:  6/12  Read as flutter but looks like SR with artifact and RBBB    Telemetry:  NSR rates mid 80's  Occasional PAC  Echo:   Medications:   . acetaminophen  1,000 mg Oral 4 times per day   Or  . acetaminophen (TYLENOL) oral liquid 160 mg/5 mL  1,000 mg Oral 4 times per day  . amiodarone  400 mg Oral BID  . beta carotene w/minerals  1 tablet Oral Daily  . bisacodyl  10 mg Oral Daily  .  loratadine  10 mg Oral Daily  . metoprolol tartrate  12.5 mg Oral BID  . pantoprazole  40 mg Oral Daily  . Rivaroxaban  15 mg Oral Q supper  . senna-docusate  1 tablet Oral QHS  . tamsulosin  0.4 mg Oral Daily  . tiotropium  18 mcg Inhalation Daily  . traZODone  100 mg Oral QHS  . Umeclidinium-Vilanterol  2 puff Inhalation Daily     . dextrose 5 % and 0.45% NaCl Stopped (07/01/13 2021)    Assessment/Plan:  PAF:  Maint NSR on amiodarone  Now oral 400 bid with lopressor  Rhythm stable on current meds  Pneumothorax   CXR 6/15 with persistent small apical pneumo  Lungs still congested plan per CVTS  [name] 07/02/2013, 9:44 AM

## 2013-07-02 NOTE — Progress Notes (Signed)
PT Cancellation Note  Patient Details Name: JAYMIE MISCH MRN: [id number] DOB: 12/10/1928   Cancelled Treatment:    Reason Eval/Treat Not Completed: Patient not medically ready (pt transferred units, on trendelenburg rest at this time.) Will follow as able.   Duncan Dull 07/02/2013, 11:49 AM Alben Deeds, PT DPT  [phone]

## 2013-07-02 NOTE — Progress Notes (Signed)
4 Days Post-Op Procedure(s) (LRB): VIDEO ASSISTED THORACOSCOPY with stapling of blebs (Right) Subjective: No complaints this AM No further a fib but still having PACs + BM yesterday  Objective: Vital signs in last 24 hours: Temp:  [97.5 F (36.4 C)-98.1 F (36.7 C)] 98 F (36.7 C) (06/16 0752) Pulse Rate:  [54-93] 73 (06/16 0703) Cardiac Rhythm:  [-] Normal sinus rhythm (06/15 2331) Resp:  [18-24] 18 (06/16 0703) BP: (101-121)/(50-89) 101/50 mmHg (06/16 0703) SpO2:  [92 %-96 %] 93 % (06/16 0703)  Hemodynamic parameters for last 24 hours:    Intake/Output from previous day: 06/15 0701 - 06/16 0700 In: 743.5 [P.O.:480; I.V.:263.5] Out: 701 [Urine:700; Stool:1] Intake/Output this shift:    General appearance: alert and no distress Neurologic: intact Heart: slightly irregular Lungs: diminished breath sounds bibasilar and no wheezing Wound: clean and dry  Lab Results:  Recent Labs  06/30/13 0430  WBC 14.0*  HGB 13.4  HCT 38.9*  PLT 226   BMET:  Recent Labs  06/30/13 0430  NA 133*  K 3.8  CL 97  CO2 23  GLUCOSE 124*  BUN 14  CREATININE 0.87  CALCIUM 8.5    PT/INR: No results found for this basename: LABPROT, INR,  in the last 72 hours ABG    Component Value Date/Time   PHART 7.420 06/29/2013 0425   HCO3 23.1 06/29/2013 0425   TCO2 24.2 06/29/2013 0425   ACIDBASEDEF 0.8 06/29/2013 0425   O2SAT 93.9 06/29/2013 0425   CBG (last 3)  No results found for this basename: GLUCAP,  in the last 72 hours  Assessment/Plan: S/P Procedure(s) (LRB): VIDEO ASSISTED THORACOSCOPY with stapling of blebs (Right) - CV- postop atrial fibrillation, now sinus bradycardia with PACs  On PO amiodarone and low dose lopressor,   rivaroxaban  Will defer medical management to Cardiology  RESP- still on 1L [place], will probably need home O2  RENAL- stable  DVT prophylaxis- on rivaroxaban  Deconditioning- ambulate   LOS: 5 days    HENDRICKSON,STEVEN C 07/02/2013

## 2013-07-03 DIAGNOSIS — Z7901 Long term (current) use of anticoagulants: Secondary | ICD-10-CM

## 2013-07-03 MED ORDER — AMIODARONE HCL 200 MG PO TABS: 200.0000 mg | ORAL_TABLET | Freq: Two times a day (BID) | ORAL | Status: DC

## 2013-07-03 MED ORDER — TRAZODONE HCL 100 MG PO TABS: 100.0000 mg | ORAL_TABLET | Freq: Every day | ORAL | Status: DC

## 2013-07-03 MED ORDER — METOPROLOL TARTRATE 25 MG PO TABS: 12.5000 mg | ORAL_TABLET | Freq: Two times a day (BID) | ORAL | Status: DC

## 2013-07-03 MED ORDER — AMIODARONE HCL 200 MG PO TABS
200.0000 mg | ORAL_TABLET | Freq: Two times a day (BID) | ORAL | Status: DC
  Administered 2013-07-03: 200 mg via ORAL
  Filled 2013-07-03 (×2): qty 1

## 2013-07-03 MED ORDER — TRAMADOL HCL 50 MG PO TABS: 50.0000 mg | ORAL_TABLET | Freq: Four times a day (QID) | ORAL | Status: DC | PRN

## 2013-07-03 NOTE — Progress Notes (Signed)
[address]       [place] [address]             [phone]      5 Days Post-Op Procedure(s) (LRB): VIDEO ASSISTED THORACOSCOPY with stapling of blebs (Right)  Subjective:  Mr. Pullara states he is doing well this morning.  He wants to go home today.  Objective: Vital signs in last 24 hours: Temp:  [97.4 F (36.3 C)-98.6 F (37 C)] 98.6 F (37 C) (06/17 0507) Pulse Rate:  [54-74] 58 (06/17 0507) Cardiac Rhythm:  [-] Sinus bradycardia;Normal sinus rhythm (06/16 1115) Resp:  [16-20] 20 (06/17 0507) BP: (105-118)/(41-65) 118/59 mmHg (06/17 0507) SpO2:  [94 %-95 %] 95 % (06/17 0727) Weight:  [162 lb 12.8 oz (73.846 kg)] 162 lb 12.8 oz (73.846 kg) (06/17 0507)  Intake/Output from previous day: 06/16 0701 - 06/17 0700 In: 1200 [P.O.:1200] Out: 1101 [Urine:1100; Stool:1]  General appearance: alert, cooperative and no distress Heart: regular rate and rhythm Lungs: clear to auscultation bilaterally Abdomen: soft, non-tender; bowel sounds normal; no masses,  no organomegaly Wound: clean and dry  Lab Results: No results found for this basename: WBC, HGB, HCT, PLT,  in the last 72 hours BMET: No results found for this basename: NA, K, CL, CO2, GLUCOSE, BUN, CREATININE, CALCIUM,  in the last 72 hours  PT/INR: No results found for this basename: LABPROT, INR,  in the last 72 hours ABG    Component Value Date/Time   PHART 7.420 06/29/2013 0425   HCO3 23.1 06/29/2013 0425   TCO2 24.2 06/29/2013 0425   ACIDBASEDEF 0.8 06/29/2013 0425   O2SAT 93.9 06/29/2013 0425   CBG (last 3)  No results found for this basename: GLUCAP,  in the last 72 hours  Assessment/Plan: S/P Procedure(s) (LRB): VIDEO ASSISTED THORACOSCOPY with stapling of blebs (Right)  1. CV- Previous episodes of A. Fib, currently NSR- per Cardiology will taper Amiodarone over next week, continue Lopressor, Xarelto 2. Pulm- remains on oxygen, will arrange for home use 3. CXR with right apical  pneumothorax 4. Dispo- patient wishes to go home, today, apical pneumothorax on CXR, would likely benefit from repeat CXR in AM and if no change in pneumothorax d/c in AM   LOS: 6 days    Ahmed Prima, [facility] 07/03/2013

## 2013-07-03 NOTE — Progress Notes (Signed)
Subjective:  Up in chair  Objective:  Vital Signs in the last 24 hours: Temp:  [97.4 F (36.3 C)-98.6 F (37 C)] 98.6 F (37 C) (06/17 0507) Pulse Rate:  [54-74] 58 (06/17 0507) Resp:  [16-20] 20 (06/17 0507) BP: (105-118)/(41-65) 118/59 mmHg (06/17 0507) SpO2:  [94 %-95 %] 95 % (06/17 0727) Weight:  [73.846 kg (162 lb 12.8 oz)] 73.846 kg (162 lb 12.8 oz) (06/17 0507)  Intake/Output from previous day:  Intake/Output Summary (Last 24 hours) at 07/03/13 0851 Last data filed at 07/03/13 0500  Gross per 24 hour  Intake    960 ml  Output   1101 ml  Net   -141 ml    Physical Exam: General appearance: alert, cooperative and no distress Lungs: decreased Rt base Heart: regular rate and rhythm   Rate: 80  Rhythm: normal sinus rhythm  Lab Results: No results found for this basename: WBC, HGB, PLT,  in the last 72 hours No results found for this basename: NA, K, CL, CO2, GLUCOSE, BUN, CREATININE,  in the last 72 hours No results found for this basename: TROPONINI, CK, MB,  in the last 72 hours No results found for this basename: INR,  in the last 72 hours  Imaging: Imaging results have been reviewed  Cardiac Studies:  Assessment/Plan:   Active Problems:   Recurrent spontaneous pneumothorax- s/p CATS 06/29/13   PAF (paroxysmal atrial fibrillation)- 2012 and this adm post op   Chronic anticoagulation- Xarelto since 2012 (PAF)   COPD    PLAN: F/U with Dr Aundra Dubin after discharge, (we will arrange). Decrease Amio to 200 mg BID x 7 days, then 200 mg daily. He is on Xarelto.   Kerin Ransom PA-C Beeper [phone] 07/03/2013, 8:51 AM  Patient examined chart reviewed  Chest tube site right thorax ok  Agee with decreasing amiodarone with split dosing Continue xarelto for anticoagulation  [name]

## 2013-07-03 NOTE — Progress Notes (Signed)
Physical Therapy Treatment Patient Details Name: [name] MRN: [id number] DOB: 12-May-1928 Today's Date: 07/03/2013    History of Present Illness Admitted with SOB, Recurrent R pnemothorax, s/p VATS for abrasion and blebectomy.    PT Comments    Pt progressing well, does require supplemental O2 during ambulation to keep sats above 88% (see vitals below). Staggering gait without AD, much safer with RW, recommend rollator so has seat available for energy conservation. Pt ambulated 300' with one seated rest break, supervision with AD, min A without AD. PT will continue to follow.   Follow Up Recommendations  Home health PT;Supervision for mobility/OOB     Equipment Recommendations  Other (comment) (rollator)    Recommendations for Other Services       Precautions / Restrictions Precautions Precautions: Fall Precaution Comments: 2-3 L O2 [place] Restrictions Weight Bearing Restrictions: No    Mobility  Bed Mobility               General bed mobility comments: pt up in chair  Transfers Overall transfer level: Needs assistance Equipment used: Rolling walker (2 wheeled);None Transfers: Sit to/from Stand Sit to Stand: Supervision         General transfer comment: pt stood safely today with appropriate use of UE's and no physical assist needed, performed transfer multiple times  Ambulation/Gait Ambulation/Gait assistance: Min assist;Supervision Ambulation Distance (Feet): 300 Feet (150' x2) Assistive device: Rolling walker (2 wheeled);None Gait Pattern/deviations: Step-through pattern;Staggering left;Staggering right Gait velocity: decreased with RW, WFL without RW but not safe   General Gait Details: Pt ambulates slower with RW but no staggering and safe with only supervision. O2 sats 87% on RA, 91% on 3L O2. 2nd 150' pt ambulated without AD but required min A for safety as he staggers right and left and demonstrates decreased proprioception and balance  reactions. Educated pt on safe use of RW to prevent falls. Recommend rollator with seat for ease of pushing it as well as having seat available for energy conservation   Stairs            Wheelchair Mobility    Modified Rankin (Stroke Patients Only)       Balance Overall balance assessment: Needs assistance Sitting-balance support: No upper extremity supported Sitting balance-Leahy Scale: Good     Standing balance support: No upper extremity supported Standing balance-Leahy Scale: Fair Standing balance comment: see comments on balance under gait                    Cognition Arousal/Alertness: Awake/alert Behavior During Therapy: WFL for tasks assessed/performed Overall Cognitive Status: Within Functional Limits for tasks assessed                      Exercises      General Comments        Pertinent Vitals/Pain SATURATION QUALIFICATIONS: (This note is used to comply with regulatory documentation for home oxygen)  Patient Saturations on Room Air at Rest = 95%  Patient Saturations on Room Air while Ambulating = 87%  Patient Saturations on 3L Liters of oxygen while Ambulating = 91%  Please briefly explain why patient needs home oxygen: O2 desaturation on RA      Home Living                      Prior Function            PT Goals (current goals can now be found in the  care plan section) Acute Rehab PT Goals Patient Stated Goal: return home PT Goal Formulation: With patient Time For Goal Achievement: 07/13/13 Potential to Achieve Goals: Good Progress towards PT goals: Progressing toward goals    Frequency  Min 3X/week    PT Plan Current plan remains appropriate    Co-evaluation             End of Session Equipment Utilized During Treatment: Gait belt;Oxygen Activity Tolerance: Patient tolerated treatment well Patient left: in chair;with call bell/phone within reach;with family/visitor present     Time:  0918-0950 PT Time Calculation (min): 32 min  Charges:  $Gait Training: 23-37 mins                    G Codes:     Leighton Roach, PT  Acute Rehab Services  [place], [place] 07/03/2013, 10:06 AM

## 2013-07-03 NOTE — Discharge Instructions (Signed)
Video-Assisted Thoracic Surgery Care After Refer to this sheet in the next few weeks. These instructions provide you with information on caring for yourself after your procedure. Your caregiver may also give you more specific instructions. Your procedure has been planned according to current medical practices, but problems sometimes occur. Call your caregiver if you have any problems or questions after your procedure. HOME CARE INSTRUCTIONS   Only take over-the-counter or prescription medications as directed.  Only take pain medications (narcotics) as directed.  Do not drive until your caregiver approves. Driving while taking narcotics or soon after surgery can be dangerous, so discuss the specific timing with your caregiver.  Avoid activities that use your chest muscles, such as lifting heavy objects, for at least 3 4 weeks.   Take deep breaths to expand the lungs and to protect against pneumonia.  Do breathing exercises as directed by your caregiver. If you were given an incentive spirometer to help with breathing, use it as directed.  You may resume a normal diet and activities when you feel you are able to or as directed.  Do not take a bath until your caregiver says it is OK. Use the shower instead.   Keep the bandage (dressing) covering the area where the chest tube was inserted (incision site) dry for 48 hours. After 48 hours, remove the dressing unless there is new drainage.  Remove dressings as directed by your caregiver.  Change dressings if necessary or as directed.  Keep all follow-up appointments. It is important for you to see your caregiver after surgery to discuss appropriate follow-up care and surveillance, if it is necessary. SEEK MEDICAL CARE:  You feel excessive or increasing pain at an incision site.  You notice bleeding, skin irritation, drainage, swelling, or redness at an incision site.  There is a bad smell coming from an incision or dressing.  It feels  like your heart is fluttering or beating rapidly.  Your pain medication does not relieve your pain. SEEK IMMEDIATE MEDICAL CARE IF:   You have a fever.   You have chest pain.  You have a rash.  You have shortness of breath.  You have trouble breathing.   You feel weak, lightheaded, dizzy, or faint.  MAKE SURE YOU:   Understand these instructions.   Will watch your condition.   Will get help right away if you are not doing well or get worse. Document Released: 04/30/2012 Document Reviewed: 04/30/2012 [facility] Patient Information 2014 [place], [place].

## 2013-07-03 NOTE — Progress Notes (Signed)
Pt and wife given d/c instructions; both verbalized understanding; IV and tele monitor d/c at this time; pt and wife given prescriptions; walker and O2 delivered to room; pt sitting up in chair eating lunch at this time; pt to be d/c after lunch per pt request; will cont. To monitor.

## 2013-07-03 NOTE — Discharge Summary (Signed)
Physician Discharge Summary       [address]       [place] [address]             [phone]    Patient ID: [name] [facility] MRN: [id number] DOB/AGE: [birth year] 78 y.o.  Admit date: 06/27/2013 Discharge date: 07/03/2013  Admission Diagnoses: 1. Recurrent, right spontaneous pneumothorax 2. History of recurrent, left spontaneous pneumothorax (s/p VAT 12') 3. History of COPD 4. History of tobacco abuse 5. History of PAF (on Xarelto since 12') 6. History of GERD 7. History of BPH  Discharge Diagnoses:  1. Recurrent, right spontaneous pneumothorax 2. History of recurrent, left spontaneous pneumothorax (s/p VAT 12') 3. History of COPD 4. History of tobacco abuse 5. History of PAF (on Xarelto since 12') 6. History of GERD 7. History of BPH  Consults: cardiology  Procedure (s):  1. Emergency right chest tube placement for pneumothorax by Dr. Prescott Gum on 06/27/2013  2.Right video-assisted thoracoscopy, blebectomy, pleural  Abrasion by Dr. Roxan Hockey on 06/28/2013  Pathology: 1. Lung, bleb(s) / bullae, Right lower lobe - MESOTHELIUM AND LUNG PARENCHYMA WITH INTERSTITIAL FIBROSIS, INFLAMMATION, AND ANTHRACOSIS. - THERE IS NO EVIDENCE OF MALIGNANCY. 2. Lung, bleb(s) / bullae, Right lower lobe - MESOTHELIUM AND LUNG PARENCHYMA WITH INTERSTITIAL FIBROSIS, INFLAMMATION, AND ANTHRACOSIS. - THERE IS NO EVIDENCE OF MALIGNANCY.  History of Presenting Illness: This is an 80 year old Caucasian male with a past medical history of right spontaneuous pneumothorax (discharged from [facility] 06/25/2013), tobacco abuse, and COPD who after using the bathroom this am became profoundly short of breath. He denies any chest pain, pleuritic pain, nausea, or diaphoresis. He presented to [facility] Emergency Department. CXR done showed a 40% right, recurrent spontaneous pneumothorax. Dr. Prescott Gum has been consulted to place a right chest tube and admit. The patient is on a 100% NRB  with oxygen saturation in the 90's. CT scan that was done on his last admission showed an indeterminate nodular density centrally, advanced emphysematous changes within the lungs, areas of scarring and/or atelectasis right lung base and scarring left lung base, and atherosclerotic disease within the aorta and coronary vessels. Dr. Roxan Hockey performed a left VATS for recurrent spontaneous pneumothorax in August 2012. He was consulted to perform a right VATS and blebectomy. Potential risks, benefits, and complications were discussed with the patient and his wife and he agreed to proceed with surgery. He underwent the aforementioned surgery on 06/28/2013.   Brief Hospital Course:  He has remained afebrile and hemodynamically stable. A line and foley were removed early in the post operative course. IVF were decreased then hep locked as he tolerated a diet. Daily chest x rays were obtained and remained stable. Chest tube had decreasing output. It was placed to water seal on 6/14. He went into a fib with RVR. Dr. Radford Pax was consulted on 6/13. He was put on Lopressor 12.5 bid and an Amiodarone drip. He was on Xarelto pre op and this had already been started. He converted to sinus rhythm. He was then placed on oral Amiodarone. He desats into the 80's on room air. He will need a couple liters of oxygen via [place] at discharge. He will also need home PT. Chest x ray today shows small right apical pneumothorax, bibasilar atelectasis, and small bilateral effusions. He is felt surgically stable for discharge  from a surgical and cardiology standpoint.  Latest Vital Signs: Blood pressure 118/59, pulse 58, temperature 98.6 F (37 C), temperature source Oral, resp. rate  20, height _0  (1.778 m), weight 162 lb 12.8 oz (73.846 kg), SpO2 93.00%.  Physical Exam: General appearance: alert, cooperative and no distress  Heart: regular rate and rhythm  Lungs: clear to auscultation bilaterally  Abdomen: soft, non-tender;  bowel sounds normal; no masses, no organomegaly  Wound: clean and dry   Discharge Condition:Stable  Recent laboratory studies:  Lab Results  Component Value Date   WBC 14.0* 06/30/2013   HGB 13.4 06/30/2013   HCT 38.9* 06/30/2013   MCV 102.6* 06/30/2013   PLT 226 06/30/2013   Lab Results  Component Value Date   NA 133* 06/30/2013   K 3.8 06/30/2013   CL 97 06/30/2013   CO2 23 06/30/2013   CREATININE 0.87 06/30/2013   GLUCOSE 124* 06/30/2013      Diagnostic Studies: Dg Chest 2 View  07/02/2013   CLINICAL DATA:  Postop bleb resection  EXAM: CHEST  2 VIEW  COMPARISON:  07/01/2013  FINDINGS: Right central line tip is in the SVC. There is hyperinflation of the lungs compatible with COPD. Small right apical pneumothorax. Coarsened bibasilar opacities bilaterally, right greater than left, likely prominent scarring or atelectasis. Small bilateral pleural effusions. Heart is normal size.  IMPRESSION: Small right apical thorax.  Bibasilar scarring and/or atelectasis and small effusions.  COPD.   Electronically Signed   By: Rolm Baptise M.D.   On: 07/02/2013 08:15   Ct Chest Wo Contrast  06/21/2013   CLINICAL DATA:  CHEST PAIN evaluation of  R pneumothorax  EXAM: CT CHEST WITHOUT CONTRAST  TECHNIQUE: Multidetector CT imaging of the chest was performed following the standard protocol without IV contrast.  COMPARISON:  Chest CT dated 12/13/2010  FINDINGS: Noncontrast evaluation of thoracic inlet unremarkable.  A large right pneumothorax is appreciated with mild shift of mediastinal structures to the left. The pneumothorax appears to encompass the greater than 50% of the right hemithorax volume. An 11 mm nodular density is appreciated centrally within the partially collapsed right lung. Surveillance evaluation of this finding with repeat chest CT with contrast status post management of the pneumothorax is recommended. This finding is appreciated on image 31 series 4. Linear density appreciated within the  dependent posterior right lung.  Bilateral advanced emphysematous changes are again appreciated. There is scarring and/or atelectasis within the anterior left lung base.  The visualized upper abdominal viscera are stable when compared to the previous study.  Multilevel spondylosis without aggressive appearing osseous lesions.  Small subcentimeter lymph nodes within the AP window, paratracheal and subcarinal regions. Atherosclerotic calcifications are appreciated within the aorta and coronary vessels.  IMPRESSION: Large right pneumothorax with mild shift of the mediastinal structures to the left. When compared to the prior chest radiograph the pneumothorax has increased in size. These results were called by telephone at the time of interpretation on 06/21/2013 at 12:21 PM to Dr. Roxy Manns , who verbally acknowledged these results.  Indeterminate nodular density centrally within the partially collapsed right lung. Repeat surveillance evaluation with contrast chest CT status post pneumothorax maintenance is recommended.  Advanced emphysematous changes within the lungs  Atherosclerotic disease within the aorta and coronary vessels  Stable cyst within the liver and kidneys.  Areas of scarring and/or atelectasis right lung base and scarring left lung base.   Electronically Signed   By: Margaree Mackintosh M.D.   On: 06/21/2013 12:25   Discharge Medications:   Medication List         albuterol 108 (90 BASE) MCG/ACT inhaler  Commonly known as:  PROAIR HFA  Inhale 2 puffs into the lungs every 4 (four) hours as needed for wheezing or shortness of breath.     amiodarone 200 MG tablet  Commonly known as:  PACERONE  Take 1 tablet (200 mg total) by mouth 2 (two) times daily. For 7 days; then take Amiodarone 200 my by mouth daily thereafter     ANORO ELLIPTA 62.5-25 MCG/INH Aepb  Generic drug:  Umeclidinium-Vilanterol  Inhale 2 puffs into the lungs daily.     beta carotene w/minerals tablet  Take 1 tablet by mouth daily.       docusate sodium 100 MG capsule  Commonly known as:  COLACE  Take 100 mg by mouth daily.     loratadine 10 MG tablet  Commonly known as:  CLARITIN  Take 10 mg by mouth daily as needed for allergies.     metoprolol tartrate 25 MG tablet  Commonly known as:  LOPRESSOR  Take 0.5 tablets (12.5 mg total) by mouth 2 (two) times daily.     omeprazole 20 MG capsule  Commonly known as:  PRILOSEC  Take 20 mg by mouth daily.     Rivaroxaban 15 MG Tabs tablet  Commonly known as:  XARELTO  Take 15 mg by mouth every morning.     tamsulosin 0.4 MG Caps capsule  Commonly known as:  FLOMAX  Take 0.4 mg by mouth daily as needed (frequemcy).     tiotropium 18 MCG inhalation capsule  Commonly known as:  SPIRIVA  Place 1 capsule (18 mcg total) into inhaler and inhale daily.     traMADol 50 MG tablet  Commonly known as:  ULTRAM  Take 1 tablet (50 mg total) by mouth every 6 (six) hours as needed for moderate pain.     traZODone 100 MG tablet  Commonly known as:  DESYREL  Take 1 tablet (100 mg total) by mouth at bedtime.        Follow Up Appointments:     Follow-up Information   Follow up with Melrose Nakayama, MD On 07/23/2013. (PA/LAT CXR to be taken (at [facility] which is in the same building as Dr. Leonarda Salon office) on 07/23/2013 at 12:00 pm;Appointment with Dr. Roxan Hockey is at 1:00 pm)    Specialty:  Cardiothoracic Surgery   Contact information:   [address] [place] [address] [phone]       Follow up with Melrose Nakayama, MD On 07/08/2013. (Appointment is with nurse only to have chest tube sutures removed at 10:00 am)    Specialty:  Cardiothoracic Surgery   Contact information:   [address] [place] [address] [phone]       Follow up with [name], MD. (Call for a follow up appointment 1-2 weeks)    Specialty:  Cardiology   Contact information:   [address] [place]  [address] [phone]       Signed: Lars Pinks MPA-C 07/03/2013, 10:41 AM

## 2013-07-08 ENCOUNTER — Ambulatory Visit (INDEPENDENT_AMBULATORY_CARE_PROVIDER_SITE_OTHER): Payer: Self-pay

## 2013-07-08 ENCOUNTER — Ambulatory Visit: Payer: Medicare Other

## 2013-07-08 DIAGNOSIS — I2510 Atherosclerotic heart disease of native coronary artery without angina pectoris: Secondary | ICD-10-CM

## 2013-07-08 DIAGNOSIS — Z4802 Encounter for removal of sutures: Secondary | ICD-10-CM

## 2013-07-08 NOTE — Progress Notes (Signed)
Removed 4 chest tube sutures with no signs of infection and patient tolerated well.

## 2013-07-16 ENCOUNTER — Ambulatory Visit (INDEPENDENT_AMBULATORY_CARE_PROVIDER_SITE_OTHER): Payer: Medicare Other | Admitting: Family Medicine

## 2013-07-16 ENCOUNTER — Telehealth: Payer: Self-pay

## 2013-07-16 ENCOUNTER — Encounter: Payer: Self-pay | Admitting: Family Medicine

## 2013-07-16 VITALS — BP 132/74 | HR 46 | Temp 97.5°F | Wt 166.5 lb

## 2013-07-16 DIAGNOSIS — F418 Other specified anxiety disorders: Secondary | ICD-10-CM | POA: Insufficient documentation

## 2013-07-16 DIAGNOSIS — F411 Generalized anxiety disorder: Secondary | ICD-10-CM

## 2013-07-16 DIAGNOSIS — R609 Edema, unspecified: Secondary | ICD-10-CM

## 2013-07-16 MED ORDER — ALPRAZOLAM 0.25 MG PO TABS: 0.2500 mg | ORAL_TABLET | Freq: Two times a day (BID) | ORAL | Status: DC | PRN

## 2013-07-16 NOTE — Telephone Encounter (Signed)
Mrs Vandervelden said noticed some swelling in pts ankles on 07/14/13; ankles are still swollen today. Pt sleeps in recliner with his feet down during the night so pts legs are never elevated. Pts wife not sure why but pt does not like for feet to be elevated.Advised Mrs Defreitas to encourage pt to elevate feet to help with swelling of ankles. Mrs Umble said pt is not having trouble breathing and does not appear in any distress.Pts pulse ox is 96% on 2 L of oxygen and P 68. Pt has no CP, H/A or dizziness. Pt does feel slightly weaker than usual. Mrs Jeziorski said pt does not want to go to [facility] or ED and request appt at [facility]. Pt scheduled with Dr Diona Browner today at 2:45. Mrs Rasnic advised if pts condition changes or worsens or any CP or difficulty breathing pt should go to ED. Mrs Bunyard voiced understanding.

## 2013-07-16 NOTE — Progress Notes (Signed)
Pre visit review using our clinic review tool, if applicable. No additional management support is needed unless otherwise documented below in the visit note. 

## 2013-07-16 NOTE — Progress Notes (Signed)
   Subjective:    Patient ID: [name], male    DOB: [birth year], 80 y.o.   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  Recent hosp admission 6/11 to 6/17. CXR done showed a 40% right, recurrent spontaneous pneumothorax. Dr. Prescott Gum  consulted to place a right chest tube and admit.  CT scan that was done on his last admission showed an indeterminate nodular density centrally, advanced emphysematous changes within the lungs, areas of scarring and/or atelectasis right lung base and scarring left lung base, and atherosclerotic disease within the aorta and coronary vessels. Dr. Roxan Hockey performed a left VATS for recurrent spontaneous pneumothorax in August 2012. He was consulted  Again this last admission to perform a right VATS and blebectomy on 06/28/2013.  He had been doing well with PT.  Since hospital discharge. He has noted decreased appetite. He has been anxious throughout the day, but worse this AM. He had bad episode this AM. Rested, relaxed. He is frustrated about health and inability to do things. Not sleeping well. He reports decreased energy. He is now on oxygen.  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 No ETOH now.   He has noted new swelling in B ankles. He has history of  significant varicose veins. He is sleeping easy chair with feet on floor. Getting better, now. No issue in hospital. Nml liver and kidney function last check. BNP nml on  06/21/2013 Last TSH 11/2012 nl.   Breathing is stable. He was able to walk yesterday.    Review of Systems  Constitutional: Positive for fatigue. Negative for fever.  HENT: Negative for ear pain.   Eyes: Negative for pain.  Respiratory: Positive for shortness of breath. Negative for wheezing.   Cardiovascular:  Positive for leg swelling. Negative for chest pain and palpitations.  Gastrointestinal: Negative for abdominal pain.       Objective:   Physical Exam  Constitutional: Vital signs are normal. He appears well-developed and well-nourished.  HENT:  Head: Normocephalic.  Right Ear: Hearing normal.  Left Ear: Hearing normal.  Nose: Nose normal.  Mouth/Throat: Oropharynx is clear and moist and mucous membranes are normal.  Neck: Trachea normal. Carotid bruit is not present. No mass and no thyromegaly present.  Cardiovascular: Normal rate, regular rhythm and normal pulses.  Exam reveals no gallop, no distant heart sounds and no friction rub.   No murmur heard. 1 plus peripheral edema B , non pitting B varicose veins and spider veins  Pulmonary/Chest: Effort normal and breath sounds normal. No accessory muscle usage. No respiratory distress. He has no decreased breath sounds. He has no wheezes. He has no rhonchi. He has no rales.  On oxygen via nasal canula  Skin: Skin is warm, dry and intact. No rash noted.  Well healing surgical site  Psychiatric: His speech is normal and behavior is normal. Judgment and thought content normal. His mood appears anxious. His affect is not blunt. He is not agitated. Cognition and memory are normal. He expresses no homicidal and no suicidal ideation.          Assessment & Plan:

## 2013-07-16 NOTE — Assessment & Plan Note (Signed)
Multifactorial likely due to inactivity, sitting in recliner at night with feet at floor, varicose veins and venous insuffiencey, fluids in hospital.  Recommended elevating feet above heart.  Recent CMET, TSH and BNP normal.

## 2013-07-16 NOTE — Telephone Encounter (Signed)
Noted.

## 2013-07-16 NOTE — Patient Instructions (Addendum)
Elevate feet above heart. Walk and move as much as tolerated.  Can consider compression hose if swelling not continuing to improve. Start xanax as needed for anxiety. Call if interested in counselor. Call if worsening breathing or fever. Follow up in 2 weeks with PCP Dr. Damita Dunnings.

## 2013-07-16 NOTE — Assessment & Plan Note (Signed)
Likely due to recent health changes.  < 2 weeks symptoms.  Will treat with xanax prn in limited fashion for temporary basis. Discussed with pt that if issues continue , he would need to consider treating GAD/depression with SSRI.  Offered counselor, but pt will consider.

## 2013-07-16 NOTE — Telephone Encounter (Signed)
Thanks, nice/kind/reliable patient.

## 2013-07-22 ENCOUNTER — Other Ambulatory Visit: Payer: Self-pay | Admitting: Thoracic Surgery (Cardiothoracic Vascular Surgery)

## 2013-07-22 DIAGNOSIS — J9383 Other pneumothorax: Secondary | ICD-10-CM

## 2013-07-23 ENCOUNTER — Encounter: Payer: Self-pay | Admitting: Thoracic Surgery (Cardiothoracic Vascular Surgery)

## 2013-07-23 ENCOUNTER — Ambulatory Visit (INDEPENDENT_AMBULATORY_CARE_PROVIDER_SITE_OTHER): Payer: Self-pay | Admitting: Thoracic Surgery (Cardiothoracic Vascular Surgery)

## 2013-07-23 ENCOUNTER — Ambulatory Visit
Admission: RE | Admit: 2013-07-23 | Discharge: 2013-07-23 | Disposition: A | Discharge status: Home / Self Care | Payer: Medicare Other | Source: Ambulatory Visit | Attending: Thoracic Surgery (Cardiothoracic Vascular Surgery) | Admitting: Thoracic Surgery (Cardiothoracic Vascular Surgery)

## 2013-07-23 VITALS — BP 98/53 | HR 60 | Resp 20 | Ht 70.0 in | Wt 166.0 lb

## 2013-07-23 DIAGNOSIS — Z09 Encounter for follow-up examination after completed treatment for conditions other than malignant neoplasm: Secondary | ICD-10-CM

## 2013-07-23 DIAGNOSIS — J9383 Other pneumothorax: Secondary | ICD-10-CM

## 2013-07-23 NOTE — Progress Notes (Signed)
HPI:  [name] returns today for a scheduled postoperative followup visit.  He is an 80 year old gentleman with severe COPD who presented with a recurrent right pneumothorax. He underwent a right VATS and blebectomy on June 12. He did have some atrial fibrillation postoperatively. He had been on the relative preoperatively and that was continued. He was started on amiodarone for atrial fibrillation. He was discharged on home oxygen. He is using that when necessary basis currently.  He says he occasionally feel some tightness or feelings back and takes a deep breath, but otherwise is not having any discomfort. He does not feel like his breathing is back 100% yet.  Past Medical History  Diagnosis Date  . Hyperlipidemia   . Spontaneous pneumothorax 09/04/2010    [name]  . Erectile dysfunction   . AF (atrial fibrillation)     after PTX 2012  . Coronary artery disease   . HSV-1 (herpes simplex virus 1) infection   . Elevated MCV   . Allergy   . GERD (gastroesophageal reflux disease)   . Arthritis   . BPH (benign prostatic hyperplasia)   . Back pain   . COPD (chronic obstructive pulmonary disease)       Current Outpatient Prescriptions  Medication Sig Dispense Refill  . albuterol (PROAIR HFA) 108 (90 BASE) MCG/ACT inhaler Inhale 2 puffs into the lungs every 4 (four) hours as needed for wheezing or shortness of breath.  1 Inhaler  2  . ALPRAZolam (XANAX) 0.25 MG tablet Take 1 tablet (0.25 mg total) by mouth 2 (two) times daily as needed for anxiety.  20 tablet  0  . amiodarone (PACERONE) 200 MG tablet Take 1 tablet (200 mg total) by mouth 2 (two) times daily. For 7 days; then take Amiodarone 200 my by mouth daily thereafter  60 tablet  1  . beta carotene w/minerals (OCUVITE) tablet Take 1 tablet by mouth daily.        Marland Kitchen docusate sodium (COLACE) 100 MG capsule Take 100 mg by mouth daily.      Marland Kitchen loratadine (CLARITIN) 10 MG tablet Take 10 mg by mouth daily as needed for  allergies.       . metoprolol tartrate (LOPRESSOR) 25 MG tablet Take 0.5 tablets (12.5 mg total) by mouth 2 (two) times daily.  30 tablet  1  . omeprazole (PRILOSEC) 20 MG capsule Take 20 mg by mouth daily.        . Rivaroxaban (XARELTO) 15 MG TABS tablet Take 15 mg by mouth every morning.      . tamsulosin (FLOMAX) 0.4 MG CAPS capsule Take 0.4 mg by mouth daily as needed (frequemcy).      Marland Kitchen tiotropium (SPIRIVA) 18 MCG inhalation capsule Place 1 capsule (18 mcg total) into inhaler and inhale daily.  30 capsule  6  . traMADol (ULTRAM) 50 MG tablet Take 1 tablet (50 mg total) by mouth every 6 (six) hours as needed for moderate pain.  30 tablet  0  . traZODone (DESYREL) 100 MG tablet Take 1 tablet (100 mg total) by mouth at bedtime.       No current facility-administered medications for this visit.    Physical Exam BP 98/53  Pulse 60  Resp 20  Ht 5' 10" (1.778 m)  Wt 166 lb (75.297 kg)  BMI 23.82 kg/m2  SpO33 60% 80 year old male in no acute distress Alert and oriented x3 with no focal deficits Incisions healing well Lungs diminished breath sounds bilaterally, no wheezing  Diagnostic Tests:  Chest x-ray  CHEST 2 VIEW  COMPARISON: July 02, 2013  FINDINGS:  The heart size and mediastinal contours are stable. The aorta is  tortuous. The lungs are hyperinflated. There is scarring of  bilateral lungs unchanged. The previously noted right apical  pneumothorax is not seen today. The visualized skeletal structures  are stable.  IMPRESSION:  No active cardiopulmonary disease. COPD. Chronic scarring of  bilateral lung bases. Previously noted pneumothorax is not seen.  Electronically Signed  By: Abelardo Diesel M.D.  On: 07/23/2013 12:14  Impression: 80 year old gentleman with severe COPD who had a blebectomy about 3 weeks ago. He had previously had the same procedure done on the left side a couple of years ago. Overall he is doing about as well as you can hope for at this point. He does have  severe COPD. He did have some major fibrillation postoperatively. He is currently on amiodarone. He will followup with Dr. Radford Pax regarding that issue. Hopefully can get off the amiodarone relatively soon.  From a surgical standpoint he's doing well. His incisions are healing nicely. He's having minimal discomfort. His activities are unrestricted, but he was advised to build into new activities gradually   Plan: He will followup with Dr. Damita Dunnings.  I will be happy to see him back if I can be of any further assistance with his care

## 2013-07-25 DIAGNOSIS — I2510 Atherosclerotic heart disease of native coronary artery without angina pectoris: Secondary | ICD-10-CM

## 2013-07-30 ENCOUNTER — Encounter: Payer: Self-pay | Admitting: Family Medicine

## 2013-07-30 ENCOUNTER — Ambulatory Visit (INDEPENDENT_AMBULATORY_CARE_PROVIDER_SITE_OTHER): Payer: Medicare Other | Admitting: Family Medicine

## 2013-07-30 VITALS — BP 140/58 | HR 64 | Temp 97.3°F | Wt 166.2 lb

## 2013-07-30 DIAGNOSIS — F418 Other specified anxiety disorders: Secondary | ICD-10-CM

## 2013-07-30 DIAGNOSIS — R350 Frequency of micturition: Secondary | ICD-10-CM

## 2013-07-30 DIAGNOSIS — I4891 Unspecified atrial fibrillation: Secondary | ICD-10-CM

## 2013-07-30 DIAGNOSIS — J9383 Other pneumothorax: Secondary | ICD-10-CM

## 2013-07-30 DIAGNOSIS — F411 Generalized anxiety disorder: Secondary | ICD-10-CM

## 2013-07-30 DIAGNOSIS — I480 Paroxysmal atrial fibrillation: Secondary | ICD-10-CM

## 2013-07-30 NOTE — Progress Notes (Signed)
Pre visit review using our clinic review tool, if applicable. No additional management support is needed unless otherwise documented below in the visit note.  F/u for PTX.  Recap of recent events: 80 year old gentleman with severe COPD who presented with a recurrent right pneumothorax. He underwent a right VATS and blebectomy on June 12. He did have some atrial fibrillation postoperatively. He had been on the relative preoperatively and that was continued. He was started on amiodarone for atrial fibrillation. He was discharged on home oxygen. He is using that when necessary basis currently.  He'll f/u with cards re: amiodarone.    BLE edema resolved with sleeping in a bed, not with his feet hanging down in the recliner.  No chest pain now, other than the scar tissue on the R chest wall, with movement.  Still getting SOB with activity, worse in the heat.  Anxiety- took 1 dose of xanax prev, didn't help much.  He is less worried now, and that is an improvement.  He hasn't needed xanax again.    Frequency but no burning with urination.  Flomax isn't helping a lot, taking 1 a day.  Nocturia. D/w pt about upping his dose.  No ADE on med.    [facility]   Meds, vitals, and allergies reviewed.   ROS: See HPI.  Otherwise, noncontributory.  nad ncat Mmm Neck supple, no LA rrr ctab abd soft, Not ttp Ext w/o edema R chest wall with healed incisions noted.

## 2013-07-30 NOTE — Patient Instructions (Addendum)
Take care.  Glad to see you.  I'll send records to the [place].   Try taking up to 2 flomax a day and see if that helps.

## 2013-07-31 ENCOUNTER — Ambulatory Visit (INDEPENDENT_AMBULATORY_CARE_PROVIDER_SITE_OTHER): Payer: Medicare Other | Admitting: Physician Assistant

## 2013-07-31 ENCOUNTER — Encounter: Payer: Self-pay | Admitting: Physician Assistant

## 2013-07-31 ENCOUNTER — Encounter: Payer: Self-pay | Admitting: Family Medicine

## 2013-07-31 VITALS — BP 117/60 | HR 42 | Ht 70.0 in | Wt 165.0 lb

## 2013-07-31 DIAGNOSIS — I498 Other specified cardiac arrhythmias: Secondary | ICD-10-CM

## 2013-07-31 DIAGNOSIS — I4891 Unspecified atrial fibrillation: Secondary | ICD-10-CM

## 2013-07-31 DIAGNOSIS — R001 Bradycardia, unspecified: Secondary | ICD-10-CM

## 2013-07-31 DIAGNOSIS — J9383 Other pneumothorax: Secondary | ICD-10-CM

## 2013-07-31 DIAGNOSIS — I480 Paroxysmal atrial fibrillation: Secondary | ICD-10-CM

## 2013-07-31 DIAGNOSIS — J449 Chronic obstructive pulmonary disease, unspecified: Secondary | ICD-10-CM

## 2013-07-31 NOTE — Progress Notes (Signed)
Cardiology Office Note    Date:  07/31/2013   ID:  [name], DOB 08-08-1928, MRN [id number]  PCP:  [name], MD  Cardiologist:  Dr. [name]      History of Present Illness: [name] [facility] is a 80 y.o. male with a hx of paroxysmal atrial fibrillation, COPD.  He developed a pneuthorax after a violent sneeze in 8/12. He required a chest tube. It was suspected that he ruptured a bleb. While in the hospital, he developed atrial fibrillation with rapid response that converted after amiodarone was begun. He had had no prior history of atrial fibrillation. After discharge, 3 week event monitor showed no further atrial fibrillation. Echo showed normal LV systolic function and no significant valvular dysfunction.  He was initially managed with aspirin and metoprolol and Amiodarone was stopped.  He went back in the hospital in 11/12 with recurrent left PTX. This time, he had VATS with blebectomy x 3 and pleural stripping/abrasion. He developed post-operative atrial fibrillation and was again started on amiodarone, eventually converting back to NSR.   He was admitted 6/11-6/15 with a recurrent R spontaneous pneumothorax.  He underwent emergent R CT placement and then R VATS with blebectomy and pleural abrasion.  There were no malignant cells on pathology.  Hospitalization was c/b AFib with RVR.  He converted to NSR on IV Amiodarone.  He was transitioned over to oral Amiodarone and d/c on 200 bid x 7 days >>> 200 QD.  He was previously on Xarelto for anticoagulation and this was continued.  He returns for follow up.  He is doing ok.  He has chronic dyspnea.  He is on O2 at home.  He denies chest pain, syncope, dizziness, orthopnea, PND, edema.     Studies:   - Myoview (06/2012): Overall Impression: Normal stress nuclear study. No ischemia or infarction.  LV Ejection Fraction: Study not gated. LV Wall Motion: Study not gated    - Echo (06/2012): - Left ventricle: The cavity size was  normal. Wall thickness was increased in a pattern of mild LVH. Systolic function was normal. The estimated ejection fraction was in the range of 55% to 60%. Wall motion was normal; there were no regional wall motion abnormalities. Doppler parameters are consistent with abnormal left ventricular relaxation (grade 1 diastolic dysfunction). - Mitral valve: Mild regurgitation. - Atrial septum: No defect or patent foramen ovale was identified. - Pulmonary arteries: PA peak pressure: 3m Hg (S).    Recent Labs: 11/26/2012: TSH 1.11  06/18/2013: HDL Cholesterol by NMR 34.70*; LDL (calc) 137*  06/21/2013: Pro B Natriuretic peptide (BNP) 157.6  06/30/2013: ALT 8; Creatinine 0.87; Hemoglobin 13.4; Potassium 3.8   Wt Readings from Last 3 Encounters:  07/31/13 165 lb (74.844 kg)  07/30/13 166 lb 4 oz (75.411 kg)  07/23/13 166 lb (75.297 kg)     Past Medical History: 1. Hyperlipidemia: myalgias with Crestor.  2. Atrial fibrillation: 1 episode has been noted in setting of pneumothorax in 8/12. Converted back to NSR after starting amiodarone. 3 week event monitor afterwards showed no further atrial fibrillation. 3. Erectile dysfunction.  4. Left pneumothorax after sneezing in 8/12 due to ruptured bleb. Recurrent PTX on left in 11/12 with VATS, blebectomy x 3, pleural stripping and abrasion (Dr. HRoxan Hockey.  5. COPD: FEV1/FVC 54% in 12/14.  6. ABIs (4/13) were normal  7. CKD  8. Exertional dyspnea: Lexiscan Cardiolite (6/14) with no ischemia or infarction. Echo (6/14) with EF 55-60%, mild MR.  Past Medical History  Diagnosis Date  . Hyperlipidemia   . Spontaneous pneumothorax 09/04/2010    [name]  . Erectile dysfunction   . AF (atrial fibrillation)     after PTX 2012  . Coronary artery disease   . HSV-1 (herpes simplex virus 1) infection   . Elevated MCV   . Allergy   . GERD (gastroesophageal reflux disease)   . Arthritis   . BPH (benign prostatic hyperplasia)   . Back pain   .  COPD (chronic obstructive pulmonary disease)     Current Outpatient Prescriptions  Medication Sig Dispense Refill  . albuterol (PROAIR HFA) 108 (90 BASE) MCG/ACT inhaler Inhale 2 puffs into the lungs every 4 (four) hours as needed for wheezing or shortness of breath.  1 Inhaler  2  . amiodarone (PACERONE) 200 MG tablet Take 1 tablet (200 mg total) by mouth 2 (two) times daily. For 7 days; then take Amiodarone 200 my by mouth daily thereafter  60 tablet  1  . beta carotene w/minerals (OCUVITE) tablet Take 1 tablet by mouth daily.        Marland Kitchen docusate sodium (COLACE) 100 MG capsule Take 100 mg by mouth daily.      Marland Kitchen loratadine (CLARITIN) 10 MG tablet Take 10 mg by mouth daily as needed for allergies.       . metoprolol tartrate (LOPRESSOR) 25 MG tablet Take 0.5 tablets (12.5 mg total) by mouth 2 (two) times daily.  30 tablet  1  . omeprazole (PRILOSEC) 20 MG capsule Take 20 mg by mouth daily.        . Rivaroxaban (XARELTO) 15 MG TABS tablet Take 15 mg by mouth every morning.      . tamsulosin (FLOMAX) 0.4 MG CAPS capsule Take 0.8 mg by mouth daily as needed (frequency).       Marland Kitchen tiotropium (SPIRIVA) 18 MCG inhalation capsule Place 1 capsule (18 mcg total) into inhaler and inhale daily.  30 capsule  6  . traZODone (DESYREL) 100 MG tablet Take 1 tablet (100 mg total) by mouth at bedtime.       No current facility-administered medications for this visit.    Allergies:   Blistex and Rosuvastatin   Social History:  The patient  reports that he quit smoking about 14 years ago. His smoking use included Cigarettes. He has a 50 pack-year smoking history. He does not have any smokeless tobacco history on file. He reports that he does not drink alcohol or use illicit drugs.   Family History:  The patient's family history includes Colon cancer in his brother; Diabetes in his mother; Hypertension in an other family member; Peripheral Artery Disease in his father; Stroke in his brother. There is no history of  Prostate cancer.   ROS:  Please see the history of present illness.   No bleeding problems.   All other systems reviewed and negative.   PHYSICAL EXAM: VS:  BP 117/60  Pulse 42  Ht _0  (1.778 m)  Wt 165 lb (74.844 kg)  BMI 23.68 kg/m2 Well nourished, well developed, in no acute distress HEENT: normal Neck: no JVD Cardiac:  normal S1, S2; RRR; no murmur Lungs:  Decreased breath sounds bilaterally, no wheezing, rhonchi or rales Abd: soft, nontender, no hepatomegaly Ext: no edema Skin: warm and dry Neuro:  CNs 2-12 intact, no focal abnormalities noted  EKG:  Sinus brady, HR 42, RBBB     ASSESSMENT AND PLAN:  1. PAF (paroxysmal atrial fibrillation):  He  is maintaining NSR.  I will go ahead and d/c Amiodarone once his current Rx if complete.  Continue Xarelto.  He is tolerating anticoagulation.   2. Bradycardia:  Stop Amiodarone as noted.  He is overall asymptomatic.  Question if his dyspnea is worse from chronotropic incompetence.  His beta blocker was d/c'd in the past due to bradycardia.  Will follow up in 1 month. If HR is not better, consider stopping beta blocker at that time.  3. COPD:  F/u with pulmonary. 4. Recurrent spontaneous pneumothorax- s/p VATS 06/29/13  5. Disposition:  F/u with Dr. [name] or me in 4-6 weeks.    Signed, Versie Starks, MHS 07/31/2013 11:53 AM    [facility] [address], [place], [place]  [address] Phone: [phone]; Fax: [phone]

## 2013-07-31 NOTE — Patient Instructions (Signed)
Your physician recommends that you continue on your current medications as directed. Please refer to the Current Medication list given to you today. Finish your last few Amiodarone pills and then Stop Amiodarone.   Your physician recommends that you schedule a follow-up appointment in: 4-6 weeks with Dr. Aundra Dubin or see Richardson Dopp on a day Dr Aundra Dubin is in the office

## 2013-07-31 NOTE — Assessment & Plan Note (Signed)
Now resolved, hopefully he'll wean off O2 fully in the near future.

## 2013-07-31 NOTE — Assessment & Plan Note (Signed)
He'll inc his flomax to 0.8 mg a day and report back as needed.  He agrees.

## 2013-07-31 NOTE — Assessment & Plan Note (Signed)
He'll f/u with cards.

## 2013-07-31 NOTE — Assessment & Plan Note (Signed)
Now resolved.

## 2013-08-01 ENCOUNTER — Telehealth: Payer: Self-pay | Admitting: Pulmonary Disease

## 2013-08-01 DIAGNOSIS — J449 Chronic obstructive pulmonary disease, unspecified: Secondary | ICD-10-CM

## 2013-08-01 NOTE — Telephone Encounter (Signed)
Called and spoke to [place]. Junie Panning is requesting pt start pulmonary rehab at [facility]. Junie Panning is aware that BQ will not be back in the office till next week.   BQ please advise if to place order for pulmonary rehab for pt.

## 2013-08-05 NOTE — Telephone Encounter (Signed)
Order placed. [email or web address] for [employer]

## 2013-08-05 NOTE — Telephone Encounter (Signed)
yes

## 2013-08-06 NOTE — Telephone Encounter (Signed)
Erin advised.Sioux Bing, CMA

## 2013-08-16 ENCOUNTER — Telehealth (HOSPITAL_COMMUNITY): Payer: Self-pay

## 2013-08-16 NOTE — Telephone Encounter (Signed)
Called patient regarding entrance into Pulmonary Rehab.  Patient states that he doesn't think he needs rehab but wants to think about it and wants [place] to call him in 2 weeks.  I will follow up with the patient.

## 2013-08-21 ENCOUNTER — Telehealth: Payer: Self-pay | Admitting: Physician Assistant

## 2013-08-21 NOTE — Telephone Encounter (Signed)
New message     Talk to Scott's nurse regarding some of his medications

## 2013-08-21 NOTE — Telephone Encounter (Signed)
Pt was seen in the [place] [facility] this past Tuesday. Pt states that his is F/U in the [place] by Dr. Posey Pronto. Pt is taking Xarelto for his arrhythmias. Dr. Posey Pronto has suggested  That he may find assistance to purchase Xarelto for him. Pt will need his cardiologist's last office note. Note requested was faxed to Dr. Posey Pronto at [facility]. Pt is aware.

## 2013-08-22 ENCOUNTER — Telehealth: Payer: Self-pay | Admitting: Cardiology

## 2013-08-22 NOTE — Telephone Encounter (Signed)
Will forward to Dr Aundra Dubin

## 2013-08-22 NOTE — Telephone Encounter (Signed)
May transition to Eliquis 5 mg bid if that will be cheaper.

## 2013-08-22 NOTE — Telephone Encounter (Signed)
New message   xarelto is very expensive.     Wants to know can he get eliquis .

## 2013-08-22 NOTE — Telephone Encounter (Signed)
[facility] states [facility]arel[facility] to Eliquis because it would be less expensive.   [facility] called him about 10 minutes ago and told him since he had been on Xarelto for 2-3 years they were going to leave him on Xarelto.

## 2013-08-24 ENCOUNTER — Other Ambulatory Visit: Payer: Self-pay | Admitting: Thoracic Surgery (Cardiothoracic Vascular Surgery)

## 2013-09-02 ENCOUNTER — Telehealth: Payer: Self-pay | Admitting: Pulmonary Disease

## 2013-09-02 NOTE — Telephone Encounter (Signed)
Called spoke with pt. He reports he had several CXR's done by Dr. Roxan Hockey (in epic) that dr. [place] Bells may want to review. Please advise thanks

## 2013-09-03 ENCOUNTER — Telehealth (HOSPITAL_COMMUNITY): Payer: Self-pay

## 2013-09-03 NOTE — Telephone Encounter (Signed)
I spoke with patient about results and he verbalized understanding and had no questions 

## 2013-09-03 NOTE — Telephone Encounter (Signed)
Please let him know that I have reviewed the radiology reports and that from my standpoint things look OK

## 2013-09-04 ENCOUNTER — Ambulatory Visit: Payer: Medicare Other | Admitting: Pulmonary Disease

## 2013-09-04 ENCOUNTER — Other Ambulatory Visit: Payer: Self-pay | Admitting: Thoracic Surgery (Cardiothoracic Vascular Surgery)

## 2013-09-05 ENCOUNTER — Ambulatory Visit (INDEPENDENT_AMBULATORY_CARE_PROVIDER_SITE_OTHER): Payer: Medicare Other | Admitting: Pulmonary Disease

## 2013-09-05 ENCOUNTER — Other Ambulatory Visit: Payer: Self-pay

## 2013-09-05 ENCOUNTER — Encounter (INDEPENDENT_AMBULATORY_CARE_PROVIDER_SITE_OTHER): Payer: Self-pay

## 2013-09-05 ENCOUNTER — Encounter: Payer: Self-pay | Admitting: Pulmonary Disease

## 2013-09-05 VITALS — BP 116/68 | HR 87 | Ht 70.0 in | Wt 167.0 lb

## 2013-09-05 DIAGNOSIS — J9611 Chronic respiratory failure with hypoxia: Secondary | ICD-10-CM | POA: Insufficient documentation

## 2013-09-05 DIAGNOSIS — J449 Chronic obstructive pulmonary disease, unspecified: Secondary | ICD-10-CM

## 2013-09-05 DIAGNOSIS — J9610 Chronic respiratory failure, unspecified whether with hypoxia or hypercapnia: Secondary | ICD-10-CM

## 2013-09-05 DIAGNOSIS — R0902 Hypoxemia: Secondary | ICD-10-CM

## 2013-09-05 MED ORDER — METOPROLOL TARTRATE 25 MG PO TABS: 12.5000 mg | ORAL_TABLET | Freq: Two times a day (BID) | ORAL | Status: DC

## 2013-09-05 NOTE — Assessment & Plan Note (Signed)
This has been a stable interval. He needs to exercise more. Specifically, he needs to start doing more resistance training. We discussed various options today. He will start doing this at his local [employer].  Plan: -Continue Spiriva -Flu shot in the fall -Followup 6 months

## 2013-09-05 NOTE — Assessment & Plan Note (Signed)
Needs 2L O2 with exertion only We will prescribe a portable oxygen concentrator if he decides that he wants one

## 2013-09-05 NOTE — Progress Notes (Signed)
Subjective:    Patient ID: [name], male    DOB: 1928-04-30, 80 y.o.   MRN: [id number]  Synopsis: former smoker with moderate airflow obstruction due to COPD.  Spirometry in 12/2011 showed clear airflow obstruction and an FEV1 of 2.12 L (67% pred).  HPI  09/05/2013 ROV > [name] has a few questions about his lungs.  He says that he gets hort of breath when he walks about 150 feet.  He has noted that his oxygen level will drop to 82-83% without oxygen.  However he can walk on the treadmill until his legs get tired without shortness of breath when he is using his oxygen level.  He has noticed that his oxygen level dropped to 87% when he walks out to the mailbox. This improves nearly immediately with rest. He has been walking on the treadmill for 25 minutes or so at a time. He does not participate in weight or resistance training. He is considering getting a portable oxygen concentrator.   Past Medical History  Diagnosis Date  . Hyperlipidemia   . Spontaneous pneumothorax 09/04/2010    [name]  . Erectile dysfunction   . AF (atrial fibrillation)     after PTX 2012  . Coronary artery disease   . HSV-1 (herpes simplex virus 1) infection   . Elevated MCV   . Allergy   . GERD (gastroesophageal reflux disease)   . Arthritis   . BPH (benign prostatic hyperplasia)   . Back pain   . COPD (chronic obstructive pulmonary disease)      Review of Systems  Constitutional: Positive for fatigue. Negative for fever and chills.  HENT: Negative for postnasal drip, rhinorrhea and sinus pressure.   Respiratory: Positive for shortness of breath. Negative for cough and wheezing.   Cardiovascular: Negative for chest pain, palpitations and leg swelling.       Objective:   Physical Exam  Filed Vitals:   09/05/13 1629  BP: 116/68  Pulse: 87  Height: _0  (1.778 m)  Weight: 167 lb (75.751 kg)  SpO2: 95%   RA  O2 saturation dropped to 84% ambulating on RA, improved with 2L O2 to  94%  Gen: well appearing HEENT: NCAT, EOMi PULM: CTA B CV: Irreg irreg, no mgr AB: BS+, soft, nontender Ext: warm, no edema      Assessment & Plan:   COPD This has been a stable interval. He needs to exercise more. Specifically, he needs to start doing more resistance training. We discussed various options today. He will start doing this at his local [employer].  Plan: -Continue Spiriva -Flu shot in the fall -Followup 6 months  Chronic hypoxemic respiratory failure Needs 2L O2 with exertion only We will prescribe a portable oxygen concentrator if he decides that he wants one    Updated Medication List Outpatient Encounter Prescriptions as of 09/05/2013  Medication Sig  . acetaminophen (TYLENOL) 325 MG tablet Take 650 mg by mouth every 6 (six) hours as needed.  Marland Kitchen albuterol (PROAIR HFA) 108 (90 BASE) MCG/ACT inhaler Inhale 2 puffs into the lungs every 4 (four) hours as needed for wheezing or shortness of breath.  . beta carotene w/minerals (OCUVITE) tablet Take 1 tablet by mouth daily.    Marland Kitchen loratadine (CLARITIN) 10 MG tablet Take 10 mg by mouth daily as needed for allergies.   . metoprolol tartrate (LOPRESSOR) 25 MG tablet Take 0.5 tablets (12.5 mg total) by mouth 2 (two) times daily.  Marland Kitchen omeprazole (PRILOSEC) 20 MG capsule  Take 20 mg by mouth daily.    . Rivaroxaban (XARELTO) 15 MG TABS tablet Take 15 mg by mouth every morning.  . tamsulosin (FLOMAX) 0.4 MG CAPS capsule Take 0.8 mg by mouth daily as needed (frequency).   Marland Kitchen tiotropium (SPIRIVA) 18 MCG inhalation capsule Place 1 capsule (18 mcg total) into inhaler and inhale daily.  . traZODone (DESYREL) 100 MG tablet Take 200 mg by mouth at bedtime.  . [DISCONTINUED] traZODone (DESYREL) 100 MG tablet Take 1 tablet (100 mg total) by mouth at bedtime.  . [DISCONTINUED] amiodarone (PACERONE) 200 MG tablet Take 1 tablet (200 mg total) by mouth 2 (two) times daily. For 7 days; then take Amiodarone 200 my by mouth daily thereafter  .  [DISCONTINUED] docusate sodium (COLACE) 100 MG capsule Take 100 mg by mouth daily.

## 2013-09-05 NOTE — Patient Instructions (Signed)
Let us know if you want a portable oxygen concentrator.  I recommend the Activox and Inogen devices Exercise regularly> make it a goal to do 25 minutes of walking daily and some resistance exercises (weights, stretching bands) daily Use your oxygen with exertion We will see you back in 6 months or sooner if needed

## 2013-09-11 ENCOUNTER — Ambulatory Visit (INDEPENDENT_AMBULATORY_CARE_PROVIDER_SITE_OTHER): Payer: Medicare Other | Admitting: Physician Assistant

## 2013-09-11 ENCOUNTER — Encounter: Payer: Self-pay | Admitting: Physician Assistant

## 2013-09-11 VITALS — BP 130/78 | HR 61 | Ht 70.0 in | Wt 166.0 lb

## 2013-09-11 DIAGNOSIS — I480 Paroxysmal atrial fibrillation: Secondary | ICD-10-CM

## 2013-09-11 DIAGNOSIS — R001 Bradycardia, unspecified: Secondary | ICD-10-CM

## 2013-09-11 DIAGNOSIS — J449 Chronic obstructive pulmonary disease, unspecified: Secondary | ICD-10-CM

## 2013-09-11 DIAGNOSIS — I4891 Unspecified atrial fibrillation: Secondary | ICD-10-CM

## 2013-09-11 DIAGNOSIS — I498 Other specified cardiac arrhythmias: Secondary | ICD-10-CM

## 2013-09-11 NOTE — Patient Instructions (Signed)
Your physician recommends that you schedule a follow-up appointment in: [place]  Your physician recommends that you continue on your current medications as directed. Please refer to the Current Medication list given to you today.

## 2013-09-11 NOTE — Progress Notes (Signed)
Cardiology Office Note    Date:  09/11/2013   ID:  [name] [facility], DOB [birth year], MRN [id number]  PCP:  [name], MD  Cardiologist:  Dr. [name]      History of Present Illness: [name] [facility] is a 80 y.o. male with a hx of paroxysmal atrial fibrillation, COPD.  He developed a pneuthorax after a violent sneeze in 8/12 requiring chest tube placement. It was suspected that he ruptured a bleb. While in the hospital, he developed AFib with RVR that converted after amiodarone was begun. He had had no prior history of atrial fibrillation. After DC, 3 week event monitor showed no further atrial fibrillation. Echo showed normal LV systolic function and no significant valvular dysfunction.  He was initially managed with aspirin and metoprolol and Amiodarone was stopped.  He went back in the hospital in 11/12 with recurrent left PTX. This time, he had VATS with blebectomy x 3 and pleural stripping/abrasion. He developed post-operative AFib and was again started on amiodarone, eventually converting back to NSR.   Admitted 06/2013 with a recurrent R spontaneous pneumothorax.  He underwent emergent R CT placement and then R VATS with blebectomy and pleural abrasion.  There were no malignant cells on pathology.  Hospitalization was c/b AFib with RVR.  He converted to NSR on IV Amiodarone.   I saw him 07/31/13.  I stopped his amiodarone.  He returns for FU.  He is doing well.  He has chronic DOE.  He is on chronic O2.  He denies chest pain, orthopnea, PND, edema.  He denies syncope, near syncope, palpitations.    Studies:   - Myoview (06/2012):  Overall Impression: Normal stress nuclear study. No ischemia or infarction.  LV Ejection Fraction: Study not gated.   - Echo (06/2012):   Mild LVH. EF  55% to 60%. Wall motion was normal;  grade 1 diastolic dysfunction, - Mitral valve: Mild regurgitation.  PA peak pressure: 54m Hg (S).   Recent Labs: 11/26/2012: TSH 1.11  06/18/2013: HDL  Cholesterol by NMR 34.70*; LDL (calc) 137*  06/21/2013: Pro B Natriuretic peptide (BNP) 157.6  06/30/2013: ALT 8; Creatinine 0.87; Hemoglobin 13.4; Potassium 3.8   Wt Readings from Last 3 Encounters:  09/11/13 166 lb (75.297 kg)  09/05/13 167 lb (75.751 kg)  07/31/13 165 lb (74.844 kg)     Past Medical History: 1. Hyperlipidemia: myalgias with Crestor.  2. Atrial fibrillation: 1 episode has been noted in setting of pneumothorax in 8/12. Converted back to NSR after starting amiodarone. 3 week event monitor afterwards showed no further atrial fibrillation. 3. Erectile dysfunction.  4. Left pneumothorax after sneezing in 8/12 due to ruptured bleb. Recurrent PTX on left in 11/12 with VATS, blebectomy x 3, pleural stripping and abrasion (Dr. HRoxan Hockey.  5. COPD: FEV1/FVC 54% in 12/14.  6. ABIs (4/13) were normal  7. CKD  8. Exertional dyspnea: Lexiscan Cardiolite (6/14) with no ischemia or infarction. Echo (6/14) with EF 55-60%, mild MR.    Past Medical History  Diagnosis Date  . Hyperlipidemia   . Spontaneous pneumothorax 09/04/2010    [name]  . Erectile dysfunction   . AF (atrial fibrillation)     after PTX 2012  . Coronary artery disease   . HSV-1 (herpes simplex virus 1) infection   . Elevated MCV   . Allergy   . GERD (gastroesophageal reflux disease)   . Arthritis   . BPH (benign prostatic hyperplasia)   . Back pain   . COPD (  chronic obstructive pulmonary disease)     Current Outpatient Prescriptions  Medication Sig Dispense Refill  . acetaminophen (TYLENOL) 325 MG tablet Take 650 mg by mouth every 6 (six) hours as needed.      Marland Kitchen albuterol (PROAIR HFA) 108 (90 BASE) MCG/ACT inhaler Inhale 2 puffs into the lungs every 4 (four) hours as needed for wheezing or shortness of breath.  1 Inhaler  2  . beta carotene w/minerals (OCUVITE) tablet Take 1 tablet by mouth daily.        Marland Kitchen loratadine (CLARITIN) 10 MG tablet Take 10 mg by mouth daily as needed for allergies.         . metoprolol tartrate (LOPRESSOR) 25 MG tablet Take 0.5 tablets (12.5 mg total) by mouth 2 (two) times daily.  30 tablet  0  . omeprazole (PRILOSEC) 20 MG capsule Take 20 mg by mouth daily.        . Rivaroxaban (XARELTO) 15 MG TABS tablet Take 15 mg by mouth every morning.      . tamsulosin (FLOMAX) 0.4 MG CAPS capsule Take 0.8 mg by mouth daily as needed (frequency).       Marland Kitchen tiotropium (SPIRIVA) 18 MCG inhalation capsule Place 1 capsule (18 mcg total) into inhaler and inhale daily.  30 capsule  6  . traZODone (DESYREL) 100 MG tablet Take 200 mg by mouth at bedtime.       No current facility-administered medications for this visit.    Allergies:   Blistex and Rosuvastatin   Social History:  The patient  reports that he quit smoking about 14 years ago. His smoking use included Cigarettes. He has a 50 pack-year smoking history. He does not have any smokeless tobacco history on file. He reports that he does not drink alcohol or use illicit drugs.   Family History:  The patient's family history includes Colon cancer in his brother; Diabetes in his mother; Hypertension in an other family member; Peripheral Artery Disease in his father; Stroke in his brother. There is no history of Prostate cancer or Heart attack.   ROS:  Please see the history of present illness.  No bleeding problems.   All other systems reviewed and negative.   PHYSICAL EXAM: VS:  BP 130/78  Pulse 61  Ht 5' 10" (1.778 m)  Wt 166 lb (75.297 kg)  BMI 23.82 kg/m2 Well nourished, well developed, in no acute distress HEENT: normal Neck: no JVD Cardiac:  normal S1, S2; RRR; no murmur Lungs:  Decreased breath sounds bilaterally, no wheezing, rhonchi or rales Abd: soft, nontender, no hepatomegaly Ext: no edema Skin: warm and dry Neuro:  CNs 2-12 intact, no focal abnormalities noted  EKG:  NSR, HR 61, RBBB     ASSESSMENT AND PLAN:  PAF (paroxysmal atrial fibrillation):  Maintaining NSR. Continue Xarelto.  He is tolerating  anticoagulation.    Bradycardia:  Resolved off of Amiodarone.  Continue current dose of beta blocker.   COPD:  F/u with pulmonary.  Recurrent spontaneous pneumothorax- s/p VATS 06/29/13   Disposition:  FU with Dr. [name] in 4 mos.     Signed, Versie Starks, MHS 09/11/2013 2:51 PM    [facility] [address], [place], [place]  [address] Phone: [phone]; Fax: [phone]

## 2013-09-18 ENCOUNTER — Ambulatory Visit (INDEPENDENT_AMBULATORY_CARE_PROVIDER_SITE_OTHER): Payer: Medicare Other | Admitting: Family Medicine

## 2013-09-18 ENCOUNTER — Encounter: Payer: Self-pay | Admitting: Family Medicine

## 2013-09-18 VITALS — BP 110/60 | HR 78 | Temp 97.6°F | Wt 164.2 lb

## 2013-09-18 DIAGNOSIS — Z23 Encounter for immunization: Secondary | ICD-10-CM

## 2013-09-18 DIAGNOSIS — H6120 Impacted cerumen, unspecified ear: Secondary | ICD-10-CM

## 2013-09-18 DIAGNOSIS — H6122 Impacted cerumen, left ear: Secondary | ICD-10-CM

## 2013-09-18 NOTE — Progress Notes (Signed)
Pre visit review using our clinic review tool, if applicable. No additional management support is needed unless otherwise documented below in the visit note.  Some SOB, slightly better than prev.  Has intermittent O2 to use.   Due for PNA vaccine, he'll get flu shot later.    Dec in L ear hearing, likely cerumen impaction. No pain. No other new sx.   Meds, vitals, and allergies reviewed.   ROS: See HPI.  Otherwise, noncontributory.  nad On intermittent O2 via [place] L cerumen impaction.  Some was in R canal but much less than L side.  Wax in L canal removed with irritation and curette.   Recheck wnl, hearing improved, tolerated well w/o complication.

## 2013-09-18 NOTE — Patient Instructions (Signed)
Use debrox in each ear before a shower.  Take care.  Glad to see you.

## 2013-09-19 ENCOUNTER — Encounter: Payer: Self-pay | Admitting: Family Medicine

## 2013-09-19 NOTE — Assessment & Plan Note (Signed)
Wax in L canal removed with irritation and curette.   Recheck wnl, hearing improved, tolerated well w/o complication.

## 2013-10-04 ENCOUNTER — Other Ambulatory Visit: Payer: Self-pay | Admitting: Cardiology

## 2013-11-19 ENCOUNTER — Other Ambulatory Visit: Payer: Self-pay | Admitting: Family Medicine

## 2013-11-19 NOTE — Telephone Encounter (Signed)
Received refill request from pharmacy for Flomax . Request from pharmacy does not match medication sheet. Is it okay to refill medication?

## 2013-11-20 NOTE — Telephone Encounter (Signed)
He had increased to 0.11m a day.  Sent.  Thanks.

## 2013-11-22 ENCOUNTER — Telehealth: Payer: Self-pay

## 2013-11-22 NOTE — Telephone Encounter (Signed)
LMOVM advising to call back and schedule Medicare physical for 2015

## 2013-11-29 ENCOUNTER — Telehealth: Payer: Self-pay | Admitting: Family Medicine

## 2013-11-29 NOTE — Telephone Encounter (Signed)
Pt called back wanting to know if he needs to do a cpx medicare for 2015 See below note   Lanelle Bal, CMA at 11/22/2013 11:59 AM     Status: Signed       Expand All Collapse All   LMOVM advising to call back and schedule Medicare physical for 2015

## 2013-12-03 NOTE — Telephone Encounter (Signed)
He had tried to schedule one but he had been in the hospital earlier this year.  I don't think it has to be done before the end of 2015- I would get it set up for spring of 2016.

## 2013-12-04 NOTE — Telephone Encounter (Signed)
Left message asking pt to call office  °

## 2013-12-05 ENCOUNTER — Other Ambulatory Visit: Payer: Self-pay | Admitting: Cardiology

## 2013-12-05 NOTE — Telephone Encounter (Signed)
Appointment 5/2 labs 05/22/14 cpx Pt aware

## 2014-01-01 ENCOUNTER — Encounter: Payer: Self-pay | Admitting: Cardiology

## 2014-01-01 ENCOUNTER — Ambulatory Visit (INDEPENDENT_AMBULATORY_CARE_PROVIDER_SITE_OTHER): Payer: Medicare Other | Admitting: Cardiology

## 2014-01-01 VITALS — BP 134/82 | HR 78 | Ht 70.0 in | Wt 163.0 lb

## 2014-01-01 DIAGNOSIS — I480 Paroxysmal atrial fibrillation: Secondary | ICD-10-CM

## 2014-01-01 DIAGNOSIS — J9611 Chronic respiratory failure with hypoxia: Secondary | ICD-10-CM

## 2014-01-01 LAB — CBC WITH DIFFERENTIAL/PLATELET
BASOS PCT: 0.1 % (ref 0.0–3.0)
Basophils Absolute: 0 10*3/uL (ref 0.0–0.1)
EOS ABS: 0.1 10*3/uL (ref 0.0–0.7)
Eosinophils Relative: 0.6 % (ref 0.0–5.0)
HEMATOCRIT: 44.1 % (ref 39.0–52.0)
Hemoglobin: 14.6 g/dL (ref 13.0–17.0)
LYMPHS ABS: 1.7 10*3/uL (ref 0.7–4.0)
Lymphocytes Relative: 17.4 % (ref 12.0–46.0)
MCHC: 33.2 g/dL (ref 30.0–36.0)
MCV: 106.8 fl — AB (ref 78.0–100.0)
MONO ABS: 0.9 10*3/uL (ref 0.1–1.0)
Monocytes Relative: 9 % (ref 3.0–12.0)
Neutro Abs: 7.2 10*3/uL (ref 1.4–7.7)
Neutrophils Relative %: 72.9 % (ref 43.0–77.0)
Platelets: 262 10*3/uL (ref 150.0–400.0)
RBC: 4.13 Mil/uL — AB (ref 4.22–5.81)
RDW: 14.9 % (ref 11.5–15.5)
WBC: 9.8 10*3/uL (ref 4.0–10.5)

## 2014-01-01 LAB — BASIC METABOLIC PANEL
BUN: 21 mg/dL (ref 6–23)
CALCIUM: 8.9 mg/dL (ref 8.4–10.5)
CO2: 26 mEq/L (ref 19–32)
CREATININE: 1.3 mg/dL (ref 0.4–1.5)
Chloride: 110 mEq/L (ref 96–112)
GFR: 56.94 mL/min — AB (ref >60.00)
GLUCOSE: 97 mg/dL (ref 70–99)
POTASSIUM: 3.8 meq/L (ref 3.5–5.1)
Sodium: 143 mEq/L (ref 135–145)

## 2014-01-01 NOTE — Patient Instructions (Addendum)
Your physician recommends that you have  lab work today--BMET/CBCd  Your physician wants you to follow-up in: 6 months with Dr Aundra Dubin. (June 2016). You will receive a reminder letter in the mail two months in advance. If you don't receive a letter, please call our office to schedule the follow-up appointment.

## 2014-01-02 NOTE — Progress Notes (Signed)
Patient ID: [name] [facility], male   DOB: 07/24/1928, 80 y.o.   MRN: [id number] PCP: Dr. Damita Dunnings  80 yo with paroxysmal atrial fibrillation presents for cardiology followup.  Patient developed a pneuthorax after a violent sneeze in 8/12.  He required a chest tube.  It was suspected that he ruptured a bleb.  While in the hospital, he developed atrial fibrillation with rapid response that converted after amiodarone was begun.  He had had no prior history of atrial fibrillation.  After discharge, 3 week event monitor showed no further atrial fibrillation.  Echo showed normal LV systolic function and no significant valvular dysfunction.  I initially managed him with aspirin and metoprolol and stopped the amiodarone. He went back in the hospital in 11/12 with recurrent left PTX.  This time, he had VATS with blebectomy x 3 and pleural stripping/abrasion.  He developed post-operative atrial fibrillation and was again started on amiodarone, eventually converting back to NSR.  Due to exertional dyspnea, he had a Lexiscan Cardiolite in 6/14 that was normal.  Echo in 6/14 showed normal EF.  He was admitted in 6/15 with spontaneous right PTX.  This time, he had right blebectomy and pleural abrasion.  No malignant cells.  Hospitalization was complicated by atrial fibrillation with RVR.   No further palpitations.  No chest pain.  He continues to be short of breath with moderate exertion (after walking 200 yards).  He is wearing home oxygen because of COPD.  He is short of breath walking up a hill or up a flight of steps.  No lightheadedness or syncope.    Labs (8/12): LDL 126, HDL 33, TSH normal, creatinine 1.2 Labs (11/12): K 3.7, creatinine 0.9 (GFR 78) Labs (12/12): Creatinine 1.5, HCT 40 Labs (5/14): K 4.3, creatinine 1.5, LDL 133 Labs (11/14): K 4.3, creatinine 1.3, BNP 60, TSH normal Labs (6/15): K 3.8, creatinine 0.87, HCT 38.9  ECG: NSR with RBBB, LAFB, inferior Qs  PMH: 1. Hyperlipidemia: myalgias with  Crestor.  2. Atrial fibrillation: 1 episode has been noted in setting of pneumothorax in 8/12.  Converted back to NSR after starting amiodarone.  3 week event monitor afterwards showed no further atrial fibrillation. Atrial fibrillation with PTX in 6/15.   3. Erectile dysfunction.  4. Left pneumothorax after sneezing in 8/12 due to ruptured bleb.  Recurrent PTX on left in 11/12 with VATS, blebectomy x 3, pleural stripping and abrasion (Dr. Roxan Hockey).  Recurrent right PTX in 6/15 with right VATS, blebectomy, pleural abrasion (no malignant cells).  5. COPD: FEV1/FVC 54% in 12/14.  He is on home oxygen.  6. ABIs (4/13) were normal 7. CKD 8. Exertional dyspnea: Lexiscan Cardiolite (6/14) with no ischemia or infarction.  Echo (6/14) with EF 55-60%, mild MR.   SH: Lives in [name] with wife.  Quit smoking around 2000 after 50 pack-years.  Retired from [employer].  No ETOH.   FH: Mother with PCM, father with CVA and ? CAD.  Current Outpatient Prescriptions  Medication Sig Dispense Refill  . acetaminophen (TYLENOL) 325 MG tablet Take 650 mg by mouth every 6 (six) hours as needed.    Marland Kitchen albuterol (PROAIR HFA) 108 (90 BASE) MCG/ACT inhaler Inhale 2 puffs into the lungs every 4 (four) hours as needed for wheezing or shortness of breath. 1 Inhaler 2  . beta carotene w/minerals (OCUVITE) tablet Take 1 tablet by mouth daily.      Marland Kitchen loratadine (CLARITIN) 10 MG tablet Take 10 mg by mouth daily as needed for allergies.     Marland Kitchen  metoprolol tartrate (LOPRESSOR) 25 MG tablet TAKE 1/2 TABLET (12.5 MG TOTAL) BY MOUTH 2 (TWO) TIMES DAILY. 30 tablet 1  . NON FORMULARY Oxygen 2 liters as needed    . omeprazole (PRILOSEC) 20 MG capsule Take 20 mg by mouth daily.      . Rivaroxaban (XARELTO) 15 MG TABS tablet Take 15 mg by mouth every morning.    . tamsulosin (FLOMAX) 0.4 MG CAPS capsule Take 2 capsules (0.8 mg total) by mouth daily. (Patient taking differently: Take 0.4 mg by mouth daily. ) 60 capsule 5  . tiotropium  (SPIRIVA) 18 MCG inhalation capsule Place 1 capsule (18 mcg total) into inhaler and inhale daily. 30 capsule 6  . traZODone (DESYREL) 100 MG tablet Take 200 mg by mouth at bedtime.     No current facility-administered medications for this visit.    BP 134/82 mmHg  Pulse 78  Ht _0  (1.778 m)  Wt 163 lb (73.936 kg)  BMI 23.39 kg/m2  SpO2 94% General: NAD Neck: No JVD, no thyromegaly or thyroid nodule.  Lungs: Decreased breath sounds bilaterally.  CV: Nondisplaced PMI.  Heart sounds quiet, regular S1/S2, +S4, no murmur.  Trace ankle edema.  No carotid bruit.  Feet are warm but I am unable to palpate pedal pulses.   Abdomen: Soft, nontender, no hepatosplenomegaly, no distention.  Neurologic: Alert and oriented x 3.  Psych: Normal affect. Extremities: No clubbing or cyanosis.   Assessment/Plan: 1. Exertional dyspnea:  He is not in atrial fibrillation.  ETT-Cardiolite and echo in 6/14 were unremarkable.  He has been seen by pulmonary and has significant COPD, which is the likely cause of his dyspnea.  He is on Spiriva and home oxygen. 2. Paroxysmal atrial fibrillation: He has had atrial fibrillation only in association with pneumothoraces.  He is in NSR today with no recent palpitations.  Continue Xarelto and metoprolol.  I will get CBC and BMET.   Followup in 6 months.    [name] 01/02/2014

## 2014-01-29 ENCOUNTER — Ambulatory Visit (INDEPENDENT_AMBULATORY_CARE_PROVIDER_SITE_OTHER): Payer: Medicare Other | Admitting: Family Medicine

## 2014-01-29 ENCOUNTER — Encounter: Payer: Self-pay | Admitting: Family Medicine

## 2014-01-29 VITALS — BP 116/68 | HR 72 | Temp 97.4°F | Wt 168.5 lb

## 2014-01-29 DIAGNOSIS — J309 Allergic rhinitis, unspecified: Secondary | ICD-10-CM | POA: Diagnosis not present

## 2014-01-29 MED ORDER — FLUTICASONE PROPIONATE 50 MCG/ACT NA SUSP: 2.0000 | Freq: Every day | NASAL | Status: AC

## 2014-01-29 NOTE — Progress Notes (Signed)
Pre visit review using our clinic review tool, if applicable. No additional management support is needed unless otherwise documented below in the visit note.  More SOB in the last 6 weeks, around the time the weather started changing.  More drainage, post nasal gtt and cough/throat clearing.  No anterior rhinorrhea.  No sputum now.  Still with cough, but minimal.  No fevers.  No ST.  No ear pain.  He feels like he has some SOB with exertion now.  On O2 intermittently, with activity.   No wheeze usually.  Rare sx o/w.  O2 does help some.  He tried Human resources officer and claritin w/o relief.  On low dose of flonase.  Has more dyspnea from nasal congestion with nasal breathing compared to oral breathing.   Meds, vitals, and allergies reviewed.   ROS: See HPI.  Otherwise, noncontributory.  GEN: nad, alert and oriented HEENT: mucous membranes moist, tm w/o erythema, nasal exam w/o erythema, clear discharge noted,  OP with cobblestoning and post nasal gtt noted NECK: supple w/o LA CV: rrr.   Occ ectopy noted.  PULM: ctab w/o wheeze, he does have global dec in BS but no focal changes, no inc wob EXT: no edema SKIN: no acute rash

## 2014-01-29 NOTE — Patient Instructions (Signed)
Increase the flonase to 2 sprays per nostril at night and see if that helps the nasal congestion and your breathing.  It not better, then notify me.  Take care.

## 2014-01-30 NOTE — Assessment & Plan Note (Signed)
Likely the issue, not a new pulmonary issue.  Inc flonase to 2 sprays per nostril per day and update me as needed.  He agrees.  95% 2L, 92-93% on RA at office.

## 2014-02-01 ENCOUNTER — Other Ambulatory Visit: Payer: Self-pay | Admitting: Cardiology

## 2014-02-02 DIAGNOSIS — J449 Chronic obstructive pulmonary disease, unspecified: Secondary | ICD-10-CM | POA: Diagnosis not present

## 2014-02-06 ENCOUNTER — Telehealth: Payer: Self-pay | Admitting: Pulmonary Disease

## 2014-02-06 NOTE — Telephone Encounter (Signed)
Called spoke with pt. He reports the [place] will cover inogen one O2. He scheduled f/u OV w/ BQ and reports he will schedule this then. FYI for [place].

## 2014-03-03 ENCOUNTER — Ambulatory Visit: Payer: Medicare Other | Admitting: Pulmonary Disease

## 2014-03-05 ENCOUNTER — Encounter: Payer: Self-pay | Admitting: Adult Health

## 2014-03-05 ENCOUNTER — Ambulatory Visit (INDEPENDENT_AMBULATORY_CARE_PROVIDER_SITE_OTHER): Payer: Medicare Other | Admitting: Adult Health

## 2014-03-05 ENCOUNTER — Encounter (INDEPENDENT_AMBULATORY_CARE_PROVIDER_SITE_OTHER): Payer: Self-pay

## 2014-03-05 VITALS — BP 112/64 | HR 52 | Temp 97.6°F | Ht 70.0 in | Wt 167.0 lb

## 2014-03-05 DIAGNOSIS — J449 Chronic obstructive pulmonary disease, unspecified: Secondary | ICD-10-CM | POA: Diagnosis not present

## 2014-03-05 DIAGNOSIS — J9611 Chronic respiratory failure with hypoxia: Secondary | ICD-10-CM | POA: Diagnosis not present

## 2014-03-05 MED ORDER — DOXYCYCLINE HYCLATE 100 MG PO TABS: 100.0000 mg | ORAL_TABLET | Freq: Two times a day (BID) | ORAL | Status: DC

## 2014-03-05 NOTE — Assessment & Plan Note (Signed)
Continue on O2 with sats to keep >90

## 2014-03-05 NOTE — Patient Instructions (Signed)
Continue on Spiriva .  Wear Oxygen 2l/m with activity .  Mucinex DM Twice daily As needed  Cough/congestion  Claritin 37m At bedtime  As needed  Drainage.  Doxycycline 1063mTwice daily  To have on hold if symptoms worsen with discolored mucus.  Please contact office for sooner follow up if symptoms do not improve or worsen or seek emergency care  Follow up Dr. McLake Bellss planned and As needed

## 2014-03-05 NOTE — Assessment & Plan Note (Signed)
Mild flare with URI   Plan  Continue on Spiriva .  Wear Oxygen 2l/m with activity .  Mucinex DM Twice daily As needed  Cough/congestion  Claritin 59m At bedtime  As needed  Drainage.  Doxycycline 106mTwice daily  To have on hold if symptoms worsen with discolored mucus.  Please contact office for sooner follow up if symptoms do not improve or worsen or seek emergency care  Follow up Dr. McLake Bellss planned and As needed

## 2014-03-05 NOTE — Progress Notes (Signed)
   Subjective:    Patient ID: [name], male    DOB: 1928-03-16, 81 y.o.   MRN: [id number]  HPI Synopsis: former smoker with moderate airflow obstruction due to COPD.  Spirometry in 12/2011 showed clear airflow obstruction and an FEV1 of 2.12 L (67% pre)  03/05/2014 Acute OV  Presents for BQ pt here for increased SOB, dry cough, head congestion w/ PND with clear drainage x4 weeks.  Denies any f/c/s, n/v/d, hemoptysis. No discolored mucus.  Wears oxygen with activity gets winded easily without O2.  Wears oxygen with activity and really helps.  Looking into. [employer] concentrator with [facility] to cover cost.  Remains on Spiriva daily    Review of Systems Constitutional:   No  weight loss, night sweats,  Fevers, chills,  +fatigue, or  lassitude.  HEENT:   No headaches,  Difficulty swallowing,  Tooth/dental problems, or  Sore throat,                No sneezing, itching, ear ache, nasal congestion, post nasal drip,   CV:  No chest pain,  Orthopnea, PND, swelling in lower extremities, anasarca, dizziness, palpitations, syncope.   GI  No heartburn, indigestion, abdominal pain, nausea, vomiting, diarrhea, change in bowel habits, loss of appetite, bloody stools.   Resp:   No chest wall deformity  Skin: no rash or lesions.  GU: no dysuria, change in color of urine, no urgency or frequency.  No flank pain, no hematuria   MS:  No joint pain or swelling.  No decreased range of motion.  No back pain.  Psych:  No change in mood or affect. No depression or anxiety.  No memory loss.         Objective:   Physical Exam GEN: A/Ox3; pleasant , NAD elderly and chronically ill appearing   HEENT:  [place]/AT,  EACs-clear, TMs-wnl, NOSE-clear, THROAT-clear, no lesions, no postnasal drip or exudate noted.   NECK:  Supple w/ fair ROM; no JVD; normal carotid impulses w/o bruits; no thyromegaly or nodules palpated; no lymphadenopathy.  RESP  Decreased BS in bases , no wheezing , no accessory muscle  use, no dullness to percussion  CARD:  RRR, no m/r/g  , no peripheral edema, pulses intact, no cyanosis or clubbing.  GI:   Soft & nt; nml bowel sounds; no organomegaly or masses detected.  Musco: Warm bil, no deformities or joint swelling noted.   Neuro: alert, no focal deficits noted.    Skin: Warm, no lesions or rashes         Assessment & Plan:

## 2014-03-19 DIAGNOSIS — M25511 Pain in right shoulder: Secondary | ICD-10-CM | POA: Diagnosis not present

## 2014-04-03 DIAGNOSIS — J449 Chronic obstructive pulmonary disease, unspecified: Secondary | ICD-10-CM | POA: Diagnosis not present

## 2014-05-04 DIAGNOSIS — J449 Chronic obstructive pulmonary disease, unspecified: Secondary | ICD-10-CM | POA: Diagnosis not present

## 2014-05-18 ENCOUNTER — Other Ambulatory Visit: Payer: Self-pay | Admitting: Family Medicine

## 2014-05-18 DIAGNOSIS — I480 Paroxysmal atrial fibrillation: Secondary | ICD-10-CM

## 2014-05-18 DIAGNOSIS — E785 Hyperlipidemia, unspecified: Secondary | ICD-10-CM

## 2014-05-19 ENCOUNTER — Other Ambulatory Visit (INDEPENDENT_AMBULATORY_CARE_PROVIDER_SITE_OTHER): Payer: Medicare Other

## 2014-05-19 DIAGNOSIS — E785 Hyperlipidemia, unspecified: Secondary | ICD-10-CM | POA: Diagnosis not present

## 2014-05-19 DIAGNOSIS — I480 Paroxysmal atrial fibrillation: Secondary | ICD-10-CM | POA: Diagnosis not present

## 2014-05-19 LAB — LIPID PANEL
Cholesterol: 188 mg/dL (ref 0–200)
HDL: 31.5 mg/dL — AB (ref >39.00)
LDL Cholesterol: 132 mg/dL — ABNORMAL HIGH (ref 0–99)
NONHDL: 156.5
TRIGLYCERIDES: 124 mg/dL (ref 0.0–149.0)
Total CHOL/HDL Ratio: 6
VLDL: 24.8 mg/dL (ref 0.0–40.0)

## 2014-05-19 LAB — CBC WITH DIFFERENTIAL/PLATELET
BASOS ABS: 0 10*3/uL (ref 0.0–0.1)
BASOS PCT: 0.2 % (ref 0.0–3.0)
EOS ABS: 0.1 10*3/uL (ref 0.0–0.7)
EOS PCT: 1.1 % (ref 0.0–5.0)
HCT: 41.7 % (ref 39.0–52.0)
Hemoglobin: 14.2 g/dL (ref 13.0–17.0)
LYMPHS ABS: 1.3 10*3/uL (ref 0.7–4.0)
Lymphocytes Relative: 15.6 % (ref 12.0–46.0)
MCHC: 34 g/dL (ref 30.0–36.0)
MCV: 104.2 fl — AB (ref 78.0–100.0)
Monocytes Absolute: 0.8 10*3/uL (ref 0.1–1.0)
Monocytes Relative: 10.5 % (ref 3.0–12.0)
NEUTROS PCT: 72.6 % (ref 43.0–77.0)
Neutro Abs: 5.8 10*3/uL (ref 1.4–7.7)
PLATELETS: 269 10*3/uL (ref 150.0–400.0)
RBC: 4 Mil/uL — ABNORMAL LOW (ref 4.22–5.81)
RDW: 15.5 % (ref 11.5–15.5)
WBC: 8 10*3/uL (ref 4.0–10.5)

## 2014-05-19 LAB — COMPREHENSIVE METABOLIC PANEL
ALT: 8 U/L (ref 0–53)
AST: 13 U/L (ref 0–37)
Albumin: 3.7 g/dL (ref 3.5–5.2)
Alkaline Phosphatase: 63 U/L (ref 39–117)
BUN: 17 mg/dL (ref 6–23)
CO2: 28 meq/L (ref 19–32)
CREATININE: 1.23 mg/dL (ref 0.40–1.50)
Calcium: 8.9 mg/dL (ref 8.4–10.5)
Chloride: 105 mEq/L (ref 96–112)
GFR: 59.56 mL/min — AB (ref >60.00)
Glucose, Bld: 99 mg/dL (ref 70–99)
Potassium: 4.1 mEq/L (ref 3.5–5.1)
SODIUM: 140 meq/L (ref 135–145)
Total Bilirubin: 0.7 mg/dL (ref 0.2–1.2)
Total Protein: 7 g/dL (ref 6.0–8.3)

## 2014-05-22 ENCOUNTER — Encounter: Payer: Self-pay | Admitting: Family Medicine

## 2014-05-22 ENCOUNTER — Ambulatory Visit (INDEPENDENT_AMBULATORY_CARE_PROVIDER_SITE_OTHER): Payer: Medicare Other | Admitting: Family Medicine

## 2014-05-22 VITALS — BP 132/66 | HR 70 | Temp 97.5°F | Ht 70.0 in | Wt 163.8 lb

## 2014-05-22 DIAGNOSIS — K219 Gastro-esophageal reflux disease without esophagitis: Secondary | ICD-10-CM

## 2014-05-22 DIAGNOSIS — J9611 Chronic respiratory failure with hypoxia: Secondary | ICD-10-CM | POA: Diagnosis not present

## 2014-05-22 DIAGNOSIS — Z0000 Encounter for general adult medical examination without abnormal findings: Secondary | ICD-10-CM

## 2014-05-22 DIAGNOSIS — Z7189 Other specified counseling: Secondary | ICD-10-CM

## 2014-05-22 DIAGNOSIS — G4700 Insomnia, unspecified: Secondary | ICD-10-CM

## 2014-05-22 MED ORDER — TAMSULOSIN HCL 0.4 MG PO CAPS: 0.8000 mg | ORAL_CAPSULE | Freq: Every day | ORAL | Status: DC

## 2014-05-22 NOTE — Progress Notes (Signed)
Pre visit review using our clinic review tool, if applicable. No additional management support is needed unless otherwise documented below in the visit note.  I have personally reviewed the Medicare Annual Wellness questionnaire and have noted 1. The patient's medical and social history 2. Their use of alcohol, tobacco or illicit drugs 3. Their current medications and supplements 4. The patient's functional ability including ADL's, fall risks, home safety risks and hearing or visual             impairment. 5. Diet and physical activities 6. Evidence for depression or mood disorders  The patients weight, height, BMI have been recorded in the chart and visual acuity is per eye clinic.  I have made referrals, counseling and provided education to the patient based review of the above and I have provided the pt with a written personalized care plan for preventive services.  Provider list updated- see scanned forms.  Routine anticipatory guidance given to patient.  See health maintenance.  Flu 2015 Shingles 2008 PNA 2010 Tetanus 2011 Colonoscopy NA due to age, d/w pt.  He agrees.  Prostate cancer screening NA due to age and comorbidities, d/w pt.  He agrees.  Advance directive- wife designated if patient were incapacitated.  Cognitive function addressed- see scanned forms- and if abnormal then additional documentation follows.   He had questions about PPI dosing and renal data.  Cr is still okay, we talked about labs and a possible taper off PPI.  He'll try and can update me as needed.  No blood in stool. Heartburn had been controlled.   Insomnia.  Continues with frequent waking at night.  Compliant with trazodone, some help with that med but still not sleeping through the night.  Failed melatonin prev.    H/o PTX, still on O2, "I'm slowing down some" with dec in exertional/functional capacity over the last year.  He didn't know if pulmonary rehab would be useful/feasible, d/w pt.   PMH and  SH reviewed  Meds, vitals, and allergies reviewed.   ROS: See HPI.  Otherwise negative.    GEN: nad, alert and oriented, on O2 HEENT: mucous membranes moist NECK: supple w/o LA CV: rrr. PULM: ctab, no inc wob, no wheeze noted ABD: soft, +bs EXT: no edema SKIN: no acute rash

## 2014-05-22 NOTE — Patient Instructions (Signed)
You can try taking less of the omeprazole, try taking it every other day then every third day.   Let me check on options for insomnia and pulmonary rehab.  Take care. Glad to see you.

## 2014-05-23 DIAGNOSIS — Z7189 Other specified counseling: Secondary | ICD-10-CM | POA: Insufficient documentation

## 2014-05-23 NOTE — Assessment & Plan Note (Signed)
He can try PPI taper and will update me as needed. He agrees.

## 2014-05-23 NOTE — Assessment & Plan Note (Signed)
Flu 2015 Shingles 2008 PNA 2010 Tetanus 2011 Colonoscopy NA due to age, d/w pt.  He agrees.  Prostate cancer screening NA due to age and comorbidities, d/w pt.  He agrees.  Advance directive- wife designated if patient were incapacitated.  Cognitive function addressed- see scanned forms- and if abnormal then additional documentation follows.

## 2014-05-23 NOTE — Assessment & Plan Note (Signed)
I'll check with pulmonary in the meantime about pulmonary rehab and will notify pt.  He agrees.

## 2014-05-23 NOTE — Assessment & Plan Note (Signed)
I'd like to consider his case and then update him.  He agrees with that.  No change in meds.  For now.  He agrees.

## 2014-05-27 ENCOUNTER — Telehealth: Payer: Self-pay | Admitting: Family Medicine

## 2014-05-27 DIAGNOSIS — J9610 Chronic respiratory failure, unspecified whether with hypoxia or hypercapnia: Secondary | ICD-10-CM

## 2014-05-27 NOTE — Telephone Encounter (Signed)
I am checking on options re: insomnia.  I can't remember if patient tried (or could tolerate) benadryl 75m.  Has he tried that? Let me know.  And I've sent a note to pulmonary about possible pulmonary rehab.  I'll let him know what I hear.  Thanks.

## 2014-05-27 NOTE — Telephone Encounter (Signed)
Patient advised.   Patient says he has slept better for the past few nights but will certainly be willing to try Benadryl.  He says he thinks he tried it years ago but not recently.  He is agreeable to pulmonary rehab but says when he checked into that before, it was going to cost him $50 for each visit and they wanted him to come twice a week so it amounted to $400 per month.  However, he says he would be willing to do it for a while and then he could maybe continue it at home.

## 2014-05-28 NOTE — Telephone Encounter (Signed)
Patient notified as instructed by telephone and verbalized understanding. Advised patient that the referral coordinator will be in touch to get this set up for him.

## 2014-05-28 NOTE — Telephone Encounter (Signed)
Duly noted, I put in the referral for pulmonary rehab for him to at least get started.  Thanks.

## 2014-06-03 DIAGNOSIS — J449 Chronic obstructive pulmonary disease, unspecified: Secondary | ICD-10-CM | POA: Diagnosis not present

## 2014-06-06 ENCOUNTER — Other Ambulatory Visit: Payer: Self-pay | Admitting: Family Medicine

## 2014-06-06 NOTE — Telephone Encounter (Signed)
Last filled 2014--please advise

## 2014-06-08 NOTE — Telephone Encounter (Signed)
Sent. Thanks.   

## 2014-06-17 ENCOUNTER — Ambulatory Visit (INDEPENDENT_AMBULATORY_CARE_PROVIDER_SITE_OTHER): Payer: Medicare Other | Admitting: Cardiology

## 2014-06-17 ENCOUNTER — Encounter: Payer: Self-pay | Admitting: Cardiology

## 2014-06-17 VITALS — BP 114/62 | HR 70 | Wt 161.0 lb

## 2014-06-17 DIAGNOSIS — I480 Paroxysmal atrial fibrillation: Secondary | ICD-10-CM

## 2014-06-17 NOTE — Patient Instructions (Signed)
Medication Instructions:  No changes today  Labwork: None today  Testing/Procedures: None today  Follow-Up: Your physician wants you to follow-up in: 1 year with Dr Aundra Dubin. (May 2017).  You will receive a reminder letter in the mail two months in advance. If you don't receive a letter, please call our office to schedule the follow-up appointment.   Any Other Special Instructions Will Be Listed Below (If Applicable).  You have been referred to Pulmonary Rehab at [facility].

## 2014-06-18 NOTE — Progress Notes (Signed)
Patient ID: [name] [facility], male   DOB: 1928/07/24, 81 y.o.   MRN: [id number] PCP: Dr. Damita Dunnings  81 yo with paroxysmal atrial fibrillation presents for cardiology followup.  Patient developed a pneuthorax after a violent sneeze in 8/12.  He required a chest tube.  It was suspected that he ruptured a bleb.  While in the hospital, he developed atrial fibrillation with rapid response that converted after amiodarone was begun.  He had had no prior history of atrial fibrillation.  After discharge, 3 week event monitor showed no further atrial fibrillation.  Echo showed normal LV systolic function and no significant valvular dysfunction.  I initially managed him with aspirin and metoprolol and stopped the amiodarone. He went back in the hospital in 11/12 with recurrent left PTX.  This time, he had VATS with blebectomy x 3 and pleural stripping/abrasion.  He developed post-operative atrial fibrillation and was again started on amiodarone, eventually converting back to NSR.  Due to exertional dyspnea, he had a Lexiscan Cardiolite in 6/14 that was normal.  Echo in 6/14 showed normal EF.  He was admitted in 6/15 with spontaneous right PTX.  This time, he had right blebectomy and pleural abrasion.  No malignant cells.  Hospitalization was complicated by atrial fibrillation with RVR.   No further palpitations.  No chest pain.  He continues to be short of breath with moderate exertion (after walking 100 feet).  He had a bad bronchitis during the winter and does not feel like he ever totally recovered from it.  He is wearing home oxygen with exertion because of COPD.  He is short of breath walking up a hill or up a flight of steps.  No lightheadedness or syncope.  No stroke-like symptoms.  No BRBPR or melena.   Labs (8/12): LDL 126, HDL 33, TSH normal, creatinine 1.2 Labs (11/12): K 3.7, creatinine 0.9 (GFR 78) Labs (12/12): Creatinine 1.5, HCT 40 Labs (5/14): K 4.3, creatinine 1.5, LDL 133 Labs (11/14): K 4.3,  creatinine 1.3, BNP 60, TSH normal Labs (6/15): K 3.8, creatinine 0.87, HCT 38.9 Labs (5/16): K 4.1, creatinine 1.23, HDL 31.5, LDL 132, HCT 41.7  ECG: NSR with RBBB, inferior Qs  PMH: 1. Hyperlipidemia: myalgias with Crestor.  2. Atrial fibrillation: 1 episode has been noted in setting of pneumothorax in 8/12.  Converted back to NSR after starting amiodarone.  3 week event monitor afterwards showed no further atrial fibrillation. Atrial fibrillation with PTX in 6/15.   3. Erectile dysfunction.  4. Left pneumothorax after sneezing in 8/12 due to ruptured bleb.  Recurrent PTX on left in 11/12 with VATS, blebectomy x 3, pleural stripping and abrasion (Dr. Roxan Hockey).  Recurrent right PTX in 6/15 with right VATS, blebectomy, pleural abrasion (no malignant cells).  5. COPD: FEV1/FVC 54% in 12/14.  He is on home oxygen.  6. ABIs (4/13) were normal 7. CKD 8. Exertional dyspnea: Lexiscan Cardiolite (6/14) with no ischemia or infarction.  Echo (6/14) with EF 55-60%, mild MR.   SH: [name] with wife.  Quit smoking around 2000 after 50 pack-years.  Retired from [employer].  No ETOH.   FH: Mother with PCM, father with CVA and ? CAD.  Current Outpatient Prescriptions  Medication Sig Dispense Refill  . acetaminophen (TYLENOL) 325 MG tablet Take 650 mg by mouth every 6 (six) hours as needed.    Marland Kitchen albuterol (PROAIR HFA) 108 (90 BASE) MCG/ACT inhaler Inhale 2 puffs into the lungs every 4 (four) hours as needed for wheezing  or shortness of breath. 1 Inhaler 2  . beta carotene w/minerals (OCUVITE) tablet Take 1 tablet by mouth daily.      . fluticasone (FLONASE) 50 MCG/ACT nasal spray Place 2 sprays into both nostrils daily.    . metoprolol tartrate (LOPRESSOR) 25 MG tablet TAKE 1/2 TABLET (12.5 MG TOTAL) BY MOUTH 2 (TWO) TIMES DAILY. 30 tablet 5  . NON FORMULARY Oxygen 2 liters as needed    . omeprazole (PRILOSEC) 20 MG capsule Take 20 mg by mouth daily.      . Rivaroxaban (XARELTO) 15 MG TABS  tablet Take 15 mg by mouth every morning.    . tamsulosin (FLOMAX) 0.4 MG CAPS capsule Take 2 capsules (0.8 mg total) by mouth daily. 60 capsule 5  . tiotropium (SPIRIVA) 18 MCG inhalation capsule Place 1 capsule (18 mcg total) into inhaler and inhale daily. 30 capsule 6  . traZODone (DESYREL) 100 MG tablet Take 200 mg by mouth at bedtime.    . triamcinolone cream (KENALOG) 0.1 % APPLY TOPICALLY 2 (TWO) TIMES DAILY. 30 g 0   No current facility-administered medications for this visit.    BP 114/62 mmHg  Pulse 70  Wt 161 lb (73.029 kg)  SpO2 94% General: NAD Neck: No JVD, no thyromegaly or thyroid nodule.  Lungs: Decreased breath sounds bilaterally.  CV: Nondisplaced PMI.  Heart sounds quiet, regular S1/S2, +S4, no murmur.  Trace ankle edema.  No carotid bruit.  Feet are warm but I am unable to palpate pedal pulses.   Abdomen: Soft, nontender, no hepatosplenomegaly, no distention.  Neurologic: Alert and oriented x 3.  Psych: Normal affect. Extremities: No clubbing or cyanosis.   Assessment/Plan: 1. Exertional dyspnea:  He is not in atrial fibrillation.  ETT-Cardiolite and echo in 6/14 were unremarkable.  He has been seen by pulmonary and has significant COPD, which is the likely cause of his dyspnea.  He is on Spiriva and home oxygen.  I would like him to do pulmonary rehab, will make referral.  2. Paroxysmal atrial fibrillation: He has had atrial fibrillation only in association with pneumothoraces.  He is in NSR today with no recent palpitations.  Continue Xarelto and metoprolol. Recent CBC and BMET ok.    Followup in 12 months.    [name] 06/18/2014

## 2014-06-19 ENCOUNTER — Telehealth (HOSPITAL_COMMUNITY): Payer: Self-pay

## 2014-06-19 NOTE — Telephone Encounter (Signed)
Returned patients call regarding pulmonary rehab. Patient stated he had too much to do to discuss program at this time and requested someone call back next week. [name] has been referred to pulmonary rehab in the past but unfortunately had a high copay/visit that he could not afford. Will follow up with patient per his request at a later time.

## 2014-06-26 ENCOUNTER — Telehealth (HOSPITAL_COMMUNITY): Payer: Self-pay

## 2014-06-26 NOTE — Telephone Encounter (Signed)
Called patient to discuss Pulmonary Rehab referral.  No way to leave message. Will send letter and follow up.

## 2014-07-02 ENCOUNTER — Other Ambulatory Visit: Payer: Self-pay | Admitting: Family Medicine

## 2014-07-03 ENCOUNTER — Telehealth (HOSPITAL_COMMUNITY): Payer: Self-pay | Admitting: *Deleted

## 2014-07-04 DIAGNOSIS — J449 Chronic obstructive pulmonary disease, unspecified: Secondary | ICD-10-CM | POA: Diagnosis not present

## 2014-07-08 DIAGNOSIS — M545 Low back pain: Secondary | ICD-10-CM | POA: Diagnosis not present

## 2014-07-08 DIAGNOSIS — M25552 Pain in left hip: Secondary | ICD-10-CM | POA: Diagnosis not present

## 2014-07-08 DIAGNOSIS — M542 Cervicalgia: Secondary | ICD-10-CM | POA: Diagnosis not present

## 2014-07-08 DIAGNOSIS — S46012A Strain of muscle(s) and tendon(s) of the rotator cuff of left shoulder, initial encounter: Secondary | ICD-10-CM | POA: Diagnosis not present

## 2014-07-14 ENCOUNTER — Telehealth (HOSPITAL_COMMUNITY): Payer: Self-pay

## 2014-07-14 NOTE — Telephone Encounter (Signed)
Called patient regarding entrance to Pulmonary Rehab.  Patient states that they are interested in attending the program.  [name] is going to verify insurance coverage and follow up.

## 2014-07-25 ENCOUNTER — Encounter (HOSPITAL_COMMUNITY): Payer: Self-pay

## 2014-07-25 ENCOUNTER — Encounter (HOSPITAL_COMMUNITY)
Admission: RE | Admit: 2014-07-25 | Discharge: 2014-07-25 | Disposition: A | Discharge status: Home / Self Care | Payer: Medicare Other | Source: Ambulatory Visit | Attending: Internal Medicine | Admitting: Internal Medicine

## 2014-07-25 VITALS — BP 147/59 | HR 70 | Resp 18 | Ht 69.0 in | Wt 162.5 lb

## 2014-07-25 DIAGNOSIS — J449 Chronic obstructive pulmonary disease, unspecified: Secondary | ICD-10-CM | POA: Diagnosis not present

## 2014-07-25 DIAGNOSIS — J9611 Chronic respiratory failure with hypoxia: Secondary | ICD-10-CM

## 2014-07-25 NOTE — Progress Notes (Signed)
[name] 81 y.o. male Pulmonary Rehab Orientation Note Patient arrived today in Cardiac and Pulmonary Rehab for orientation to Pulmonary Rehab. He was transported from [employer] via wheel chair, accompanied by his wife. He does carry portable oxygen. Per pt, he uses oxygen intermittently during the day with activity. Color good, skin warm and dry. Patient is oriented to time and place. Patient's medical history and medications reviewed. Heart rate is normal, irregular in rhythm. Patient has a history of Afib/PACs. Breath sounds clear to auscultation, no wheezes, rales, or rhonchi. Grip strength equal, strong. Distal pulses palpable. Patient reports he does take medications as prescribed. Patient states he follows a Regular diet. The patient reports no specific efforts to gain or lose weight. He does stated he has lost approximately 12 pounds since his most recent respiratory illness several months ago. His BMI remains in the healthy range.Patient's weight will be monitored closely. Demonstration and practice of PLB using pulse oximeter. Patient able to return demonstration satisfactorily. Safety and hand hygiene in the exercise area reviewed with patient. Patient voices understanding of the information reviewed. Department expectations discussed with patient and achievable goals were set. The patient shows enthusiasm about attending the program and we look forward to working with this nice gentleman. The patient is scheduled for a 6 min walk test on Tuesday 7/19 and to begin exercise on Thursday 7/21 in the 10:30 class.   45 minutes was spent on a variety of activities such as assessment of the patient, obtaining baseline data including height, weight, BMI, and grip strength, verifying medical history, allergies, and current medications, and teaching patient strategies for performing tasks with less respiratory effort with emphasis on pursed lip breathing.

## 2014-07-26 ENCOUNTER — Other Ambulatory Visit: Payer: Self-pay | Admitting: Family Medicine

## 2014-08-03 DIAGNOSIS — J449 Chronic obstructive pulmonary disease, unspecified: Secondary | ICD-10-CM | POA: Diagnosis not present

## 2014-08-05 ENCOUNTER — Encounter (HOSPITAL_COMMUNITY)
Admission: RE | Admit: 2014-08-05 | Discharge: 2014-08-05 | Disposition: A | Discharge status: Home / Self Care | Payer: Medicare Other | Source: Ambulatory Visit | Attending: Internal Medicine | Admitting: Internal Medicine

## 2014-08-05 DIAGNOSIS — J449 Chronic obstructive pulmonary disease, unspecified: Secondary | ICD-10-CM | POA: Diagnosis not present

## 2014-08-05 NOTE — Progress Notes (Signed)
[name] completed a Six-Minute Walk Test on 08/05/14 . [name] walked 905 feet with 0 breaks.  The patient's lowest oxygen saturation was 88 %, highest heart rate was 93 bpm , and highest blood pressure was 130/70. The patient was on 4 liters of oxygen with a nasal cannula. Patient stated that nothing hindered their walk test.

## 2014-08-07 ENCOUNTER — Telehealth: Payer: Self-pay | Admitting: Pulmonary Disease

## 2014-08-07 ENCOUNTER — Encounter (HOSPITAL_COMMUNITY)
Admission: RE | Admit: 2014-08-07 | Discharge: 2014-08-07 | Disposition: A | Discharge status: Home / Self Care | Payer: Medicare Other | Source: Ambulatory Visit | Attending: Internal Medicine | Admitting: Internal Medicine

## 2014-08-07 DIAGNOSIS — J449 Chronic obstructive pulmonary disease, unspecified: Secondary | ICD-10-CM | POA: Diagnosis not present

## 2014-08-07 DIAGNOSIS — J9611 Chronic respiratory failure with hypoxia: Secondary | ICD-10-CM

## 2014-08-07 NOTE — Telephone Encounter (Signed)
Called molly and lmtcb x1

## 2014-08-07 NOTE — Progress Notes (Signed)
Today, [name] exercised at [employer]. Service time was from 10:30am to 12:30pm.  The patient exercised by performing aerobic, strengthening, and stretching exercises. Oxygen saturation, heart rate, blood pressure, rate of perceived exertion, and shortness of breath were all monitored before, during, and after exercise. [name] presented with no problems at today's exercise session. The patient attended education today with Trish Fountain on Anatomy and Physiology.  The patient did not have an increase in workload intensity during today's exercise session.  Pre-exercise vitals: . Weight kg: 73.7 . Liters of O2: 2 . SpO2: 95 . HR: 64 . BP: 110/60 . CBG: na  Exercise vitals: . Highest heartrate:  81 . Lowest oxygen saturation: 89 . Highest blood pressure: 136/72 . Liters of 02: 4  Post-exercise vitals: . SpO2: 94 . HR: 62 . BP: 112/68 . Liters of O2: 2 . CBG: na  Dr. Brand Males, Medical Director Dr. Coralyn Pear is immediately available during today's Pulmonary Rehab session for Culley Hedeen [facility] on 08/07/14 at 10:30am class time.

## 2014-08-08 NOTE — Telephone Encounter (Signed)
Called for [employer] and lmtcb 2

## 2014-08-08 NOTE — Telephone Encounter (Signed)
[phone], molly pulm rehab cb

## 2014-08-12 ENCOUNTER — Encounter (HOSPITAL_COMMUNITY)
Admission: RE | Admit: 2014-08-12 | Discharge: 2014-08-12 | Disposition: A | Discharge status: Home / Self Care | Payer: Medicare Other | Source: Ambulatory Visit | Attending: Internal Medicine | Admitting: Internal Medicine

## 2014-08-12 DIAGNOSIS — J449 Chronic obstructive pulmonary disease, unspecified: Secondary | ICD-10-CM | POA: Diagnosis not present

## 2014-08-12 NOTE — Telephone Encounter (Signed)
lmtcb for [employer]. Per pt's chart, a POC was ordered by BQ 08/2013.

## 2014-08-12 NOTE — Telephone Encounter (Signed)
Spoke with Cloyde Reams at [employer], states that pt is requesting a new order for a POC.  I advised that an order was placed on 09/05/13 for a POC eval.  Cloyde Reams wants to have pt re-evaluated for POC.  Dr. Lake Bells are you ok with this order?  Thanks!

## 2014-08-12 NOTE — Progress Notes (Signed)
Today, [name] exercised at [employer]. Service time was from 10:30am to 12:30pm.  The patient exercised by performing aerobic, strengthening, and stretching exercises. Oxygen saturation, heart rate, blood pressure, rate of perceived exertion, and shortness of breath were all monitored before, during, and after exercise. [name] presented with no problems at today's exercise session. The patient reviewed Pursed Lip and Diaphragmatic Breathing today with Crysten Kaman.  The patient did have an increase in workload intensity during today's exercise session.  Pre-exercise vitals: . Weight kg: 72.6 . Liters of O2: 2 . SpO2: 91 . HR: 67 . BP: 102/60 . CBG: na  Exercise vitals: . Highest heartrate:  74 . Lowest oxygen saturation: 91 . Highest blood pressure: 118/72 . Liters of 02: 4  Post-exercise vitals: . SpO2: 97 . HR: 64 . BP: 126/69 . Liters of O2: 2 . CBG: na  Dr. Brand Males, Medical Director Dr. Coralyn Pear is immediately available during today's Pulmonary Rehab session for [facility] on 08/12/14 at 10:30am class time.

## 2014-08-14 ENCOUNTER — Encounter: Payer: Self-pay | Admitting: Family Medicine

## 2014-08-14 ENCOUNTER — Other Ambulatory Visit: Payer: Self-pay | Admitting: Cardiology

## 2014-08-14 ENCOUNTER — Ambulatory Visit (INDEPENDENT_AMBULATORY_CARE_PROVIDER_SITE_OTHER): Payer: Medicare Other | Admitting: Family Medicine

## 2014-08-14 ENCOUNTER — Encounter (HOSPITAL_COMMUNITY)
Admission: RE | Admit: 2014-08-14 | Discharge: 2014-08-14 | Disposition: A | Discharge status: Home / Self Care | Payer: Medicare Other | Source: Ambulatory Visit | Attending: Internal Medicine | Admitting: Internal Medicine

## 2014-08-14 VITALS — BP 110/66 | HR 96 | Temp 97.4°F | Wt 160.2 lb

## 2014-08-14 DIAGNOSIS — M545 Low back pain, unspecified: Secondary | ICD-10-CM

## 2014-08-14 DIAGNOSIS — J449 Chronic obstructive pulmonary disease, unspecified: Secondary | ICD-10-CM | POA: Diagnosis not present

## 2014-08-14 MED ORDER — TRAMADOL HCL 50 MG PO TABS: 50.0000 mg | ORAL_TABLET | Freq: Two times a day (BID) | ORAL | Status: DC | PRN

## 2014-08-14 NOTE — Assessment & Plan Note (Signed)
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  Tylenol isn't helping enough.  Reasonable to try ice and tramadol.  D/w pt.  I don't think his hip is the source/issue here. D/w pt.  He agrees.  Sedation caution on tramadol, he may only need it at night.  He agrees.

## 2014-08-14 NOTE — Telephone Encounter (Signed)
Fine by me  

## 2014-08-14 NOTE — Telephone Encounter (Signed)
Called pulmonary rehab and spoke with Thayer Headings. Left message with Thayer Headings to inform Cloyde Reams and new order will be placed to re-evaluate pt for POC. Order placed. LMTCB for pt to inform.

## 2014-08-14 NOTE — Patient Instructions (Signed)
Use tramadol if needed.  You can take it with tylenol.  Ice may help.  Take care.  Glad to see you.

## 2014-08-14 NOTE — Progress Notes (Signed)
Today, [name] exercised at [employer]. Service time was from 11:15am to 1:15pm.  The patient exercised by performing aerobic, strengthening, and stretching exercises. Oxygen saturation, heart rate, blood pressure, rate of perceived exertion, and shortness of breath were all monitored before, during, and after exercise. Sargon presented with no problems at today's exercise session. The patient attended education today with Dr. Nelda Marseille.  The patient did not have an increase in workload intensity during today's exercise session.  Pre-exercise vitals: . Weight kg: 72.9 . Liters of O2: 2 . SpO2: 93 . HR: 68 . BP: 101/59 . CBG: na  Exercise vitals: . Highest heartrate:  128 . Lowest oxygen saturation: 86 . Highest blood pressure: 112/70 . Liters of 02: 4  Post-exercise vitals: . SpO2: 95 . HR: 56 . BP: 98/76 . Liters of O2: 2 . CBG: na  Dr. Brand Males, Medical Director Dr. Dyann Kief is immediately available during today's Pulmonary Rehab session for Malikiah Debarr [facility] on 08/14/14 at 10:30am class time.

## 2014-08-14 NOTE — Progress Notes (Signed)
Pre visit review using our clinic review tool, if applicable. No additional management support is needed unless otherwise documented below in the visit note.  Sx started about 2 months ago. Pain in the L buttock going up steps.  No pain with stepping up with R leg.  He started taking steps only with his R leg.   Would usually have more pain the AM, but better after walking some in the AM if on flat ground.   He had also had some L shoulder pain.  He had seen [facility] for L shoulder pain and L hip/buttock area.  Wasn't injected.  In the meantime, L shoulder pain got better.  Still with L buttock pain.    Could still sleep w/o pain until recently.  He has started pulmonary rehab, walking.     Ortho notes reviewed, with xray of L spine with degenerative changes, no sig changes on the L hip.    Meds, vitals, and allergies reviewed.   ROS: See HPI.  Otherwise, noncontributory.  nad ncat IRR, not tachy Ctab, no focal dec in BS On O2 abd soft Back w/o midline pain L lower back slightly ttp w/o rash or bruising Normal ROM L hip S/S wnl for the BLE

## 2014-08-15 NOTE — Telephone Encounter (Signed)
Called and spoke to pt. Informed him that he will be evaluated to see if he can tolerate a POC. Pt verbalized understanding and denied any further questions or concerns at this time.

## 2014-08-19 ENCOUNTER — Encounter (HOSPITAL_COMMUNITY)
Admission: RE | Admit: 2014-08-19 | Discharge: 2014-08-19 | Disposition: A | Discharge status: Home / Self Care | Payer: Medicare Other | Source: Ambulatory Visit | Attending: Internal Medicine | Admitting: Internal Medicine

## 2014-08-19 ENCOUNTER — Telehealth: Payer: Self-pay | Admitting: Cardiology

## 2014-08-19 DIAGNOSIS — J449 Chronic obstructive pulmonary disease, unspecified: Secondary | ICD-10-CM | POA: Diagnosis not present

## 2014-08-19 MED ORDER — METOPROLOL TARTRATE 25 MG PO TABS: 25.0000 mg | ORAL_TABLET | ORAL | Status: DC | PRN

## 2014-08-19 NOTE — Telephone Encounter (Signed)
New message     Pt is at pul rehab.  His heart rate is between 41-42 during exercise.  Pt has no other symptoms and vital signs are good.  Pt is on 12.30m of metoprolol bid.  Bp is 122/60.  They will keep him there until they hear [place] Please advise.

## 2014-08-19 NOTE — Progress Notes (Signed)
Today, [name] exercised at [employer]. Service time was from 1030 to 1235.  The patient exercised by performing aerobic, strengthening, and stretching exercises. Oxygen saturation, heart rate, blood pressure, rate of perceived exertion, and shortness of breath were all monitored before, during, and after exercise. Jahking presented with no problems at today's exercise session, however he did develop asymptomatic bradycardia during his first exercise station and required RN triage.  The patient did not have an increase in workload intensity during today's exercise session.  Pre-exercise vitals: . Weight kg: 72.4 . Liters of O2: 2L . SpO2: 92 . HR: 58 . BP: 104/80 . CBG: na  Exercise vitals: . Highest heartrate:  44 . Lowest oxygen saturation: 94 . Highest blood pressure: 122/60 . Liters of 02: 4L  Post-exercise vitals: . SpO2: 96 . HR: 45 . BP: 140/84 . Liters of O2: 2L . CBG: na  Dr. Brand Males, Medical Director Dr. Dyann Kief is immediately available during today's Pulmonary Rehab session for [facility] on 08/19/2014 at 1030 class time.  Marland Kitchen

## 2014-08-19 NOTE — Telephone Encounter (Signed)
He can stop the metoprolol, keep it for prn palpitations.

## 2014-08-19 NOTE — Progress Notes (Signed)
Pulmonary Rehab Progress Note: [name] presents to his pulmonary rehab exercise session without complaints. Check in vitals as follows: HR 58, BP 104/80, SaO2 92% on 2L. During his first exercise station on the [employer], [name] HR was recorded per pulse oximeter at 44, BP 122/60, Sao2 94 on 4L. [name] denied complaints. Placed on heart monitor. NSR rate of 41 noted. [name] stated he has seen his HR drop to 35 at rest when he checks his SaO2 on his personal pulse oximeter. Dr. Claris Gladden office notified. Order received per Randall Patella, RN to obtain 12 EKG and if patient remains asymptomatic and rhythm is SB patient may be discharged home. 12 lead obtained, rate 45 Sinus brady. Emergency symptoms reviewed with patient. Teach back noted. Discharge vitals HR 45, BP 140/84, SaO2 96 on 2L.

## 2014-08-19 NOTE — Telephone Encounter (Signed)
Called patient at home. Informed him of Dr. Aundra Dubin note. Patient verbalized understanding. Patient stated that after he had something to eat his HR come up.

## 2014-08-19 NOTE — Telephone Encounter (Signed)
Patient is asymptomatic at this time. Requested an EKG to see what kind of rhythm patient is in. If patient is just SB and asymptomatic and patient feels okay to go home, our office will contact him for further instructions. Will forward to Dr. Aundra Dubin.

## 2014-08-19 NOTE — Telephone Encounter (Signed)
EKG is scanned into patient's chart for Dr. Aundra Dubin to review.

## 2014-08-21 ENCOUNTER — Encounter (HOSPITAL_COMMUNITY)
Admission: RE | Admit: 2014-08-21 | Discharge: 2014-08-21 | Disposition: A | Discharge status: Home / Self Care | Payer: Medicare Other | Source: Ambulatory Visit | Attending: Internal Medicine | Admitting: Internal Medicine

## 2014-08-21 DIAGNOSIS — J449 Chronic obstructive pulmonary disease, unspecified: Secondary | ICD-10-CM | POA: Diagnosis not present

## 2014-08-21 NOTE — Progress Notes (Signed)
Today, [name] exercised at [employer]. Service time was from 10:30am to 12:20pm.  The patient exercised by performing aerobic, strengthening, and stretching exercises. Oxygen saturation, heart rate, blood pressure, rate of perceived exertion, and shortness of breath were all monitored before, during, and after exercise. [name] presented with no problems at today's exercise session. The patient attended education today with [facility] [name] on Risk Factors.   The patient did not have an increase in workload intensity during today's exercise session.  Pre-exercise vitals: . Weight kg: 72.9 . Liters of O2: 2 . SpO2: 95 . HR: 76 . BP: 110/50 . CBG: na  Exercise vitals: . Highest heartrate:  94 . Lowest oxygen saturation: 90 . Highest blood pressure: 122/60 . Liters of 02: 4  Post-exercise vitals: . SpO2: 91 . HR: 92 . BP: 100/60 . Liters of O2: 2 . CBG: na  Dr. Brand Males, Medical Director Dr. Frederic Jericho is immediately available during today's Pulmonary Rehab session for [name] [facility] on 08/21/14 at 10:30am class time.

## 2014-08-26 ENCOUNTER — Encounter (HOSPITAL_COMMUNITY)
Admission: RE | Admit: 2014-08-26 | Discharge: 2014-08-26 | Disposition: A | Discharge status: Home / Self Care | Payer: Medicare Other | Source: Ambulatory Visit | Attending: Internal Medicine | Admitting: Internal Medicine

## 2014-08-26 DIAGNOSIS — J449 Chronic obstructive pulmonary disease, unspecified: Secondary | ICD-10-CM | POA: Diagnosis not present

## 2014-08-26 NOTE — Progress Notes (Signed)
I have reviewed a Home Exercise Prescription with [name] . [name] is performing ADL's at home but not currently exercising.  The patient was advised to walk 2-3 days a week for 25 minutes.  [name] and I discussed how to progress their exercise prescription.  The patient stated that their goals were to be able to perform ADL's better and not become so short of breath.  The patient stated that they understand the exercise prescription.  We reviewed exercise guidelines, target heart rate during exercise, oxygen use, weather, home pulse oximeter, endpoints for exercise, and goals.  Patient is encouraged to come to me with any questions. I will continue to follow up with the patient to assist them with progression and safety.

## 2014-08-26 NOTE — Progress Notes (Signed)
Today, [name] exercised at [employer]. Service time was from 10:30am to 12:10pm.  The patient exercised by performing aerobic, strengthening, and stretching exercises. Oxygen saturation, heart rate, blood pressure, rate of perceived exertion, and shortness of breath were all monitored before, during, and after exercise. [name] presented with no problems at today's exercise session.  The patient did not have an increase in workload intensity during today's exercise session.  Pre-exercise vitals: . Weight kg: 72.0 . Liters of O2: 2 . SpO2: 95 . HR: 68 . BP: 106/54 . CBG: na  Exercise vitals: . Highest heartrate:  76 . Lowest oxygen saturation: 88 . Highest blood pressure: 110/66 . Liters of 02: 4  Post-exercise vitals: . SpO2: 98 . HR: 57 . BP: 102/60 . Liters of O2: 4 . CBG: na  Dr. Brand Males, Medical Director Dr. Frederic Jericho is immediately available during today's Pulmonary Rehab session for [name] [facility] on 08/26/14 at 10:30am class time.

## 2014-08-28 ENCOUNTER — Encounter (HOSPITAL_COMMUNITY)
Admission: RE | Admit: 2014-08-28 | Discharge: 2014-08-28 | Disposition: A | Discharge status: Home / Self Care | Payer: Medicare Other | Source: Ambulatory Visit | Attending: Internal Medicine | Admitting: Internal Medicine

## 2014-08-28 DIAGNOSIS — M545 Low back pain: Secondary | ICD-10-CM | POA: Diagnosis not present

## 2014-08-28 DIAGNOSIS — J449 Chronic obstructive pulmonary disease, unspecified: Secondary | ICD-10-CM | POA: Diagnosis not present

## 2014-08-28 NOTE — Progress Notes (Signed)
Today, [name] exercised at [employer]. Service time was from 1030 to 1215.  The patient exercised by performing aerobic, strengthening, and stretching exercises. Oxygen saturation, heart rate, blood pressure, rate of perceived exertion, and shortness of breath were all monitored before, during, and after exercise. [name] presented with no problems at today's exercise session.  Patient watched the Lung Disease Video today.  The patient did not have an increase in workload intensity during today's exercise session.  Pre-exercise vitals: . Weight kg: 72.3 . Liters of O2: 2 . SpO2: 95 . HR: 66 . BP: 124/72 . CBG: na  Exercise vitals: . Highest heartrate:  75 . Lowest oxygen saturation: 88 . Highest blood pressure: 128/70 . Liters of 02: 4  Post-exercise vitals: . SpO2: 92 . HR: 67 . BP: 100/60 . Liters of O2: 4 . CBG: na Dr. Brand Males, Medical Director Dr. Sloan Leiter is immediately available during today's Pulmonary Rehab session for [facility] on 08/28/2014  at 1030 class time.  Marland Kitchen

## 2014-09-02 ENCOUNTER — Encounter (HOSPITAL_COMMUNITY): Payer: Medicare Other

## 2014-09-03 DIAGNOSIS — J449 Chronic obstructive pulmonary disease, unspecified: Secondary | ICD-10-CM | POA: Diagnosis not present

## 2014-09-03 DIAGNOSIS — M5126 Other intervertebral disc displacement, lumbar region: Secondary | ICD-10-CM | POA: Diagnosis not present

## 2014-09-04 ENCOUNTER — Encounter (HOSPITAL_COMMUNITY)
Admission: RE | Admit: 2014-09-04 | Discharge: 2014-09-04 | Disposition: A | Discharge status: Home / Self Care | Payer: Medicare Other | Source: Ambulatory Visit | Attending: Internal Medicine | Admitting: Internal Medicine

## 2014-09-04 DIAGNOSIS — J449 Chronic obstructive pulmonary disease, unspecified: Secondary | ICD-10-CM | POA: Diagnosis not present

## 2014-09-04 NOTE — Progress Notes (Signed)
Today, [name] exercised at [employer]. Service time was from 10:30am to 12:45pm.  The patient exercised by performing aerobic, strengthening, and stretching exercises. Oxygen saturation, heart rate, blood pressure, rate of perceived exertion, and shortness of breath were all monitored before, during, and after exercise. [name] presented with no problems at today's exercise session. The patient attended education today with Derek Mound the Nutritionist.  The patient did have an increase in workload intensity during today's exercise session.  Pre-exercise vitals: . Weight kg: 71.9 . Liters of O2: 2 . SpO2: 95 . HR: 73 . BP: 130/68 . CBG: na  Exercise vitals: . Highest heartrate:  66 . Lowest oxygen saturation: 92 . Highest blood pressure: 132/70 . Liters of 02: 4  Post-exercise vitals: . SpO2: 95 . HR: 63 . BP: 104/82 . Liters of O2: 4 . CBG: na  Dr. Brand Males, Medical Director Dr. Sloan Leiter is immediately available during today's Pulmonary Rehab session for [facility] on 09/04/14 at 10:30am class time.

## 2014-09-09 ENCOUNTER — Ambulatory Visit (INDEPENDENT_AMBULATORY_CARE_PROVIDER_SITE_OTHER): Payer: Medicare Other | Admitting: Family Medicine

## 2014-09-09 ENCOUNTER — Encounter (HOSPITAL_COMMUNITY): Payer: Medicare Other

## 2014-09-09 ENCOUNTER — Encounter: Payer: Self-pay | Admitting: Family Medicine

## 2014-09-09 VITALS — BP 98/60 | HR 65 | Wt 157.5 lb

## 2014-09-09 DIAGNOSIS — G629 Polyneuropathy, unspecified: Secondary | ICD-10-CM

## 2014-09-09 DIAGNOSIS — G609 Hereditary and idiopathic neuropathy, unspecified: Secondary | ICD-10-CM

## 2014-09-09 DIAGNOSIS — I480 Paroxysmal atrial fibrillation: Secondary | ICD-10-CM | POA: Diagnosis not present

## 2014-09-09 MED ORDER — METOPROLOL TARTRATE 25 MG PO TABS: 12.5000 mg | ORAL_TABLET | ORAL | Status: AC | PRN

## 2014-09-09 NOTE — Patient Instructions (Signed)
Go to the lab on the way out.  We'll contact you with your lab report.  Don't change your meds for now.  Take the metoprolol only if you have heart racing (pulse >100).  Take care.  Glad to see you.

## 2014-09-09 NOTE — Progress Notes (Signed)
Pre visit review using our clinic review tool, if applicable. No additional management support is needed unless otherwise documented below in the visit note.  Recent events: L shoulder pain resolved.  Still with L hip pain.  He was injected but still with some discomfort.  Still walking with a cane and doesn't feel as strong as previous.    He hasn't felt as well as usual with his exercise at cardiac rehab. He has had episodes of lower heart rate, the standing BB dose has been held in the meantime but then used only PRN for elevated HR.  He has had a few "weak spells."  He felt like he was going to pass out, even though he was sitting.  He didn't pass out.  The episodes are brief and self resolve.  He hasn't had an episode since stopping the scheduled metoprolol.  No CP.  Breathing is "okay."  Still on O2.    Recently he has noted B hand sensation changes.  Can still feel, but not normally.  More trouble buttoning his shirt.  Weight loss noted.  Appetite is decreased.    Meds, vitals, and allergies reviewed.   ROS: See HPI.  Otherwise, noncontributory.  nad On O2.  ncat Mmm Neck supple, no LA IRR, not tachy No focal dec in BS No inc in wob abd soft Ext w/o edema CN 2-12 wnl B, S/S/DTR wnl x4 except for dec sensation to monofilament and vibration in stocking and glove distribution.

## 2014-09-10 LAB — CBC WITH DIFFERENTIAL/PLATELET
Basophils Absolute: 0 10*3/uL (ref 0.0–0.1)
Basophils Relative: 0.3 % (ref 0.0–3.0)
EOS PCT: 0.1 % (ref 0.0–5.0)
Eosinophils Absolute: 0 10*3/uL (ref 0.0–0.7)
HCT: 45.9 % (ref 39.0–52.0)
HEMOGLOBIN: 15.3 g/dL (ref 13.0–17.0)
Lymphocytes Relative: 10 % — ABNORMAL LOW (ref 12.0–46.0)
Lymphs Abs: 1.5 10*3/uL (ref 0.7–4.0)
MCHC: 33.4 g/dL (ref 30.0–36.0)
MCV: 108.3 fl — AB (ref 78.0–100.0)
MONOS PCT: 7.7 % (ref 3.0–12.0)
Monocytes Absolute: 1.1 10*3/uL — ABNORMAL HIGH (ref 0.1–1.0)
Neutro Abs: 12 10*3/uL — ABNORMAL HIGH (ref 1.4–7.7)
Neutrophils Relative %: 81.9 % — ABNORMAL HIGH (ref 43.0–77.0)
Platelets: 294 10*3/uL (ref 150.0–400.0)
RBC: 4.24 Mil/uL (ref 4.22–5.81)
RDW: 14.5 % (ref 11.5–15.5)
WBC: 14.7 10*3/uL — AB (ref 4.0–10.5)

## 2014-09-10 LAB — COMPREHENSIVE METABOLIC PANEL
ALBUMIN: 4.3 g/dL (ref 3.5–5.2)
ALT: 9 U/L (ref 0–53)
AST: 14 U/L (ref 0–37)
Alkaline Phosphatase: 58 U/L (ref 39–117)
BUN: 34 mg/dL — AB (ref 6–23)
CALCIUM: 9.7 mg/dL (ref 8.4–10.5)
CO2: 29 mEq/L (ref 19–32)
Chloride: 102 mEq/L (ref 96–112)
Creatinine, Ser: 1.28 mg/dL (ref 0.40–1.50)
GFR: 56.84 mL/min — AB (ref >60.00)
Glucose, Bld: 115 mg/dL — ABNORMAL HIGH (ref 70–99)
POTASSIUM: 4.8 meq/L (ref 3.5–5.1)
SODIUM: 140 meq/L (ref 135–145)
TOTAL PROTEIN: 7.4 g/dL (ref 6.0–8.3)
Total Bilirubin: 0.7 mg/dL (ref 0.2–1.2)

## 2014-09-10 LAB — VITAMIN B12: Vitamin B-12: 307 pg/mL (ref 211–911)

## 2014-09-10 LAB — TSH: TSH: 1.1 u[IU]/mL (ref 0.35–4.50)

## 2014-09-10 LAB — FOLATE: Folate: 13.2 ng/mL (ref >5.9)

## 2014-09-10 NOTE — Assessment & Plan Note (Signed)
He has stocking and glove changes and his labs are unremarkable.  I'll offer neuro eval.  This is likely contributing to his gait changes.  He is using a cane in the meantime.  This is reasonable.

## 2014-09-10 NOTE — Assessment & Plan Note (Signed)
His presyncopal sx and weakness are clearly improved since using BB only PRN.  D/w pt.

## 2014-09-11 ENCOUNTER — Encounter (HOSPITAL_COMMUNITY): Admission: RE | Admit: 2014-09-11 | Payer: Medicare Other | Source: Ambulatory Visit

## 2014-09-15 ENCOUNTER — Other Ambulatory Visit (INDEPENDENT_AMBULATORY_CARE_PROVIDER_SITE_OTHER): Payer: Medicare Other

## 2014-09-15 ENCOUNTER — Encounter: Payer: Self-pay | Admitting: Neurology

## 2014-09-15 ENCOUNTER — Other Ambulatory Visit: Payer: Medicare Other

## 2014-09-15 ENCOUNTER — Ambulatory Visit (INDEPENDENT_AMBULATORY_CARE_PROVIDER_SITE_OTHER): Payer: Medicare Other | Admitting: Neurology

## 2014-09-15 VITALS — BP 90/46 | HR 84 | Ht 70.0 in | Wt 155.0 lb

## 2014-09-15 DIAGNOSIS — G609 Hereditary and idiopathic neuropathy, unspecified: Secondary | ICD-10-CM

## 2014-09-15 DIAGNOSIS — R739 Hyperglycemia, unspecified: Secondary | ICD-10-CM | POA: Diagnosis not present

## 2014-09-15 DIAGNOSIS — R202 Paresthesia of skin: Secondary | ICD-10-CM | POA: Diagnosis not present

## 2014-09-15 LAB — HEMOGLOBIN A1C: Hgb A1c MFr Bld: 5.7 % (ref 4.6–6.5)

## 2014-09-15 NOTE — Progress Notes (Signed)
[name] was seen today in neurologic consultation at the request of Randall Stain, MD.  The consultation is for the evaluation of paresthesias in the hands and feet.  Pt is accompanied by his wife who supplements the history.  Pt states that he doesn't feel very "sure footed" and as he walks up stairs, he tries to hold onto the railing but he is finding it more difficult to grasp because of the paresthesias at the fingertips.  He first noted it perhaps 4 weeks ago.  He was at rehab and he was told to wash his hands and he noted that the fingertips were greasy but when he looked down at them, they weren't.  Then he noted that he couldn't button the shirt but admits that this has been going on a little longer than  4 weeks.  He was in pulmonary rehab so they didn't work on OT.  He states he has not had any falls for a few years.  He denies DM but thinks that he is borderline and has been for 10 years.  He denies neck pain.     ALLERGIES:   Allergies  Allergen Reactions  . Rosuvastatin     REACTION: myalgias at 21m/day dose    CURRENT MEDICATIONS:  Outpatient Encounter Prescriptions as of 09/15/2014  Medication Sig  . acetaminophen (TYLENOL) 325 MG tablet Take 650 mg by mouth every 6 (six) hours as needed.  .Marland Kitchenalbuterol (PROAIR HFA) 108 (90 BASE) MCG/ACT inhaler Inhale 2 puffs into the lungs every 4 (four) hours as needed for wheezing or shortness of breath.  . beta carotene w/minerals (OCUVITE) tablet Take 1 tablet by mouth daily.    . fluticasone (FLONASE) 50 MCG/ACT nasal spray Place 2 sprays into both nostrils daily.  . metoprolol tartrate (LOPRESSOR) 25 MG tablet Take 0.5 tablets (12.5 mg total) by mouth as needed (for palpitations).  . NON FORMULARY Oxygen 2 liters as needed  . omeprazole (PRILOSEC) 20 MG capsule Take 20 mg by mouth daily.    . Rivaroxaban (XARELTO) 15 MG TABS tablet Take 15 mg by mouth every morning.  . tamsulosin (FLOMAX) 0.4 MG CAPS capsule Take 2 capsules  (0.8 mg total) by mouth daily.  .Marland Kitchentiotropium (SPIRIVA) 18 MCG inhalation capsule Place 1 capsule (18 mcg total) into inhaler and inhale daily.  . traMADol (ULTRAM) 50 MG tablet Take 1 tablet (50 mg total) by mouth every 12 (twelve) hours as needed for moderate pain.  . traZODone (DESYREL) 100 MG tablet Take 200 mg by mouth at bedtime.  . triamcinolone cream (KENALOG) 0.1 % APPLY TOPICALLY 2 (TWO) TIMES DAILY.   No facility-administered encounter medications on file as of 09/15/2014.    PAST MEDICAL HISTORY:   Past Medical History  Diagnosis Date  . Hyperlipidemia   . Spontaneous pneumothorax 09/04/2010    [name]  . Erectile dysfunction   . AF (atrial fibrillation)     after PTX 2012  . Coronary artery disease   . HSV-1 (herpes simplex virus 1) infection   . Elevated MCV   . Allergy   . GERD (gastroesophageal reflux disease)   . BPH (benign prostatic hyperplasia)   . Back pain   . COPD (chronic obstructive pulmonary disease)   . Arthritis     h/o R shoulder injection per ortho    PAST SURGICAL HISTORY:   Past Surgical History  Procedure Laterality Date  . Chest tube placement  09/04/2010  . Video assisted thoracoscopy  12/15/2010  Procedure: VIDEO ASSISTED THORACOSCOPY;  Surgeon: Randall Nakayama, MD;  Location: [facility];  Service: Thoracic;  Laterality: Left;  blebectomy with pleural abrasion  . Video assisted thoracoscopy Right 06/28/2013    Procedure: VIDEO ASSISTED THORACOSCOPY with stapling of blebs;  Surgeon: Randall Nakayama, MD;  Location: [facility];  Service: Thoracic;  Laterality: Right;  (R) VATS W/BLEBECTOMY  . Cataract extraction, bilateral      SOCIAL HISTORY:   Social History   Social History  . Marital Status: Married    Spouse Name: N/A  . Number of Children: N/A  . Years of Education: N/A   Occupational History  . Not on file.   Social History Main Topics  . Smoking status: Former Smoker -- 1.00 packs/day for 50 years    Types: Cigarettes     Quit date: 04/14/1999  . Smokeless tobacco: Never Used  . Alcohol Use: No  . Drug Use: No  . Sexual Activity: Yes   Other Topics Concern  . Not on file   Social History Narrative   Occupation:Retired [employer]   Married 1957   Lives with wife    2 Children     [employer]    FAMILY HISTORY:   Family Status  Relation Status Death Age  . Mother Deceased 66    DM, pacemaker  . Father Deceased 20    h/o PAD, died after angiography  . Brother Deceased     stroke  . Brother Deceased   . Brother Deceased     ROS:  A complete 10 system review of systems was obtained and was unremarkable apart from what is mentioned above.  PHYSICAL EXAMINATION:    VITALS:   Filed Vitals:   09/15/14 0949  BP: 90/46  Pulse: 84  Height: _0  (1.778 m)  Weight: 155 lb (70.308 kg)    GEN:  Normal appears male in no acute distress.  Appears stated age. HEENT:  Normocephalic, atraumatic. The mucous membranes are moist. The superficial temporal arteries are without ropiness or tenderness. Cardiovascular: Irreg irreg Lungs: Clear to auscultation bilaterally.  He is wearing O2.  There is DOE Neck/Heme: There are no carotid bruits noted bilaterally.  NEUROLOGICAL: Orientation:  The patient is alert and oriented x 3.  Fund of knowledge is appropriate.  Recent and remote memory intact.  Attention span and concentration normal.  Repeats and names without difficulty. Cranial nerves: There is good facial symmetry. The pupils are equal round and reactive to light bilaterally. Fundoscopic exam is attempted but the disc margins are not well visualized bilaterally. Extraocular muscles are intact and visual fields are full to confrontational testing. Speech is fluent and clear. Soft palate rises symmetrically and there is no tongue deviation. Hearing is intact to conversational tone. Tone: Tone is good throughout. Sensation: Sensation is intact to light touch and pinprick throughout (facial,  trunk, extremities). Vibration is decreased at the bilateral big toe. There is no extinction with double simultaneous stimulation. There is no sensory dermatomal level identified. Coordination:  The patient has no difficulty with RAM's or FNF bilaterally. Motor: Strength is 5/5 in the bilateral upper and lower extremities, except the intrinsic hand mm where strength is 4+/5 (esp finger abduction bilaterally).  There is wasting of the intrinsic mm of the hands, esp the [facility].   Shoulder shrug is equal and symmetric. There is no pronator drift.  There are no fasciculations noted. DTR's: Deep tendon reflexes are 2/4 at the bilateral biceps, triceps, brachioradialis,  patella and 1/4 at the bilateral achilles.  Plantar responses are downgoing bilaterally. Gait and Station: The patient is wide based with ambulation.  Using cane and carrying O2 tank and cannot ambulate in tandem fashion.  LABS  Lab Results  Component Value Date   VITAMINB12 307 09/09/2014   Lab Results  Component Value Date   TSH 1.10 09/09/2014   Lab Results  Component Value Date   HGBA1C 5.7 02/15/2011     Chemistry      Component Value Date/Time   NA 140 09/09/2014 1529   K 4.8 09/09/2014 1529   CL 102 09/09/2014 1529   CO2 29 09/09/2014 1529   BUN 34* 09/09/2014 1529   CREATININE 1.28 09/09/2014 1529      Component Value Date/Time   CALCIUM 9.7 09/09/2014 1529   ALKPHOS 58 09/09/2014 1529   AST 14 09/09/2014 1529   ALT 9 09/09/2014 1529   BILITOT 0.7 09/09/2014 1529        IMPRESSION/PLAN  1. Gait instability and hand paresthesias  -I do suspect that the patient has peripheral neuropathy and he and his wife and I talked about this today.  The question is whether or not he has an entrapment neuropathy in the upper extremities on top of a peripheral neuropathy, which is making his dexterity even worse.  To help [place] answer this question, he will undergo an EMG.  -He was mildly B12 deficient (I would like to see  it over 400 based on several neurologic studies) and I asked him to take oral B12, 1000 g daily.  This will need to be rechecked in the future just to make sure that he observes absorbing it.  If not, then he will need a methylmalonic acid and homocysteine checked.  -I will check his RPR, SPEP/UPEP with immunofixation, hemoglobin A1c (slightly hyperglycemic on last labs)  -if all due to PN, told him he may benefit from OT and from PT for balance therapy.  Will decide on that after above completed  -safety discussed in detail with pt/wfie  -further recommendations will follow the above.  Much greater than 50% of this visit was spent in counseling with the patient and the family.  Total face to face time:  45 min

## 2014-09-15 NOTE — Patient Instructions (Signed)
1. Your provider has requested that you have labwork completed today. Please go to [facility] on the second floor of this building before leaving the office today. You do not need to check in. If you are not called within 15 minutes please check with the front desk.  2. We will call you to set up an appt for your EMG.  3. Start Vitamin B12 - 1000 mg daily. You can get this over the counter.

## 2014-09-16 ENCOUNTER — Encounter (HOSPITAL_COMMUNITY): Admission: RE | Admit: 2014-09-16 | Payer: Medicare Other | Source: Ambulatory Visit

## 2014-09-16 ENCOUNTER — Telehealth (HOSPITAL_COMMUNITY): Payer: Self-pay | Admitting: *Deleted

## 2014-09-16 LAB — RPR

## 2014-09-17 ENCOUNTER — Telehealth: Payer: Self-pay | Admitting: Cardiology

## 2014-09-17 LAB — SPEP & IFE WITH QIG
ALPHA-1-GLOBULIN: 0.2 g/dL (ref 0.2–0.3)
ALPHA-2-GLOBULIN: 0.8 g/dL (ref 0.5–0.9)
Albumin ELP: 3.9 g/dL (ref 3.8–4.8)
Beta 2: 0.3 g/dL (ref 0.2–0.5)
Beta Globulin: 0.3 g/dL — ABNORMAL LOW (ref 0.4–0.6)
GAMMA GLOBULIN: 1.1 g/dL (ref 0.8–1.7)
IGM, SERUM: 59 mg/dL (ref 41–251)
IgA: 198 mg/dL (ref 68–379)
IgG (Immunoglobin G), Serum: 1380 mg/dL (ref 650–1600)
Total Protein, Serum Electrophoresis: 6.7 g/dL (ref 6.1–8.1)

## 2014-09-17 LAB — UIFE/LIGHT CHAINS/TP QN, 24-HR UR
ALPHA 1 UR: DETECTED — AB
ALPHA 2 UR: DETECTED — AB
Albumin, U: DETECTED
BETA UR: DETECTED — AB
Gamma Globulin, Urine: DETECTED — AB
Total Protein, Urine: 11 mg/dL (ref 5–25)

## 2014-09-17 NOTE — Telephone Encounter (Signed)
New problem    Pt is having numbness in all his fingers,less circulation lower leg. Don't know what is causing this problem. Please call pt concerning this matter.

## 2014-09-17 NOTE — Telephone Encounter (Signed)
Pt states it has continued to get worse. Pt states he saw Dr Tat, neurologist, testing was ordered to evaluate numbness in fingers, EMG scheduled for 10/07/14.

## 2014-09-17 NOTE — Telephone Encounter (Signed)
Pt states he started getting a numbness in his fingers 2 weeks ago.

## 2014-09-17 NOTE — Telephone Encounter (Signed)
Pt states he has read Xarelto may cause numbness in fingers. Pt asking if Dr Aundra Dubin thinks numbness in his fingers may be related to Xarelto and if he should stop taking Xarelto.  Pt advised I will forward to Dr Aundra Dubin for review.

## 2014-09-17 NOTE — Telephone Encounter (Signed)
Would be unlikely to be causing the numbness.  Would continue workup with neurologist about the numbness and see what they find.

## 2014-09-17 NOTE — Telephone Encounter (Signed)
Pt advised, verbalized understanding.

## 2014-09-18 ENCOUNTER — Encounter (HOSPITAL_COMMUNITY): Payer: Medicare Other

## 2014-09-18 ENCOUNTER — Telehealth: Payer: Self-pay

## 2014-09-18 NOTE — Telephone Encounter (Signed)
[name] left v/m requesting written order for rolling walker. Pt last seen 09/09/14 and using cane. [name] request cb.

## 2014-09-19 ENCOUNTER — Telehealth: Payer: Self-pay | Admitting: *Deleted

## 2014-09-19 NOTE — Telephone Encounter (Signed)
rx done.  Dx G62.9.  Thanks.

## 2014-09-19 NOTE — Telephone Encounter (Signed)
Wife left voicemail at Triage. Wife picked up order for walker pt requested yesterday but pt wants a rollator walker (with a seat). Wife is requesting Dr. Damita Dunnings write new order for the rollator walker faxed to [facility] at [phone], wife request call back once done

## 2014-09-19 NOTE — Telephone Encounter (Signed)
Spoke with patient's wife and advised results

## 2014-09-22 NOTE — Telephone Encounter (Signed)
Order written on rx.  Please send in.  Thanks.   Dx G62.9, neuropathy.

## 2014-09-23 ENCOUNTER — Ambulatory Visit (INDEPENDENT_AMBULATORY_CARE_PROVIDER_SITE_OTHER): Payer: Medicare Other | Admitting: Neurology

## 2014-09-23 ENCOUNTER — Encounter (HOSPITAL_COMMUNITY): Payer: Medicare Other

## 2014-09-23 DIAGNOSIS — G609 Hereditary and idiopathic neuropathy, unspecified: Secondary | ICD-10-CM

## 2014-09-23 DIAGNOSIS — R202 Paresthesia of skin: Secondary | ICD-10-CM

## 2014-09-23 NOTE — Telephone Encounter (Signed)
Order faxed to [facility]. Patient's wife notified by telephone.

## 2014-09-23 NOTE — Procedures (Signed)
[facility]  [address]  [place], [place] [address] Tel: [phone] Fax:  [phone] Test Date:  09/23/2014  Patient: [name] DOB: 05/29/1928 Physician: Narda Amber  Sex: Male Height: 5' 10" Ref Phys: Narda Amber, D.O.  ID#: [id number] Temp: 32.2C Technician: Jerilynn Mages. Dean   Patient Complaints: This is an 80 year-old gentleman presenting for evaluation of gait instability and bilateral hand paresthesias.   NCV & EMG Findings: Extensive electrodiagnostic testing of the left upper and lower extremity shows:  1. The left median sensory response is prolonged with borderline normal amplitude. Left ulnar sensory response is absent. Left radial sensory responses within normal limits. 2. Left sural and superficial peroneal sensory responses are absent. 3. Left ulnar sensory response shows preserved amplitude with mild conduction velocity slowing across the elbow. Left median sensory responses within normal limits. 4. Left peroneal and tibial motor responses are within normal limits. 5. Left tibial H reflex is absent. 6. In the upper extremity, chronic motor axon loss changes are seen affecting the first dorsal interosseous, abductor digiti minimi, and abductor pollicis brevis muscles, without accompanied active denervation. 7. In the lower extremity, chronic motor axon loss changes are seen following a gradient pattern involving all the tested muscles. Proximal and deep muscles were not tested as the patient is on anticoagulation.  Impression: The electrophysiologic findings are most consistent with a length dependent sensorimotor polyneuropathy, predominantly axon loss in type, affecting the left upper and lower extremities. Overall, these findings are moderate to severe in degree electrically.    ___________________________ Narda Amber, DO    Nerve Conduction Studies Anti Sensory Summary Table   Stim Site NR Peak (ms) Norm Peak (ms) P-T Amp (V) Norm  P-T Amp  Left Median Anti Sensory (2nd Digit)  32.2C  Wrist    4.6 <3.8 10.2 >10  Left Radial Anti Sensory (Base 1st Digit)  32.2C  Wrist    2.5 <2.8 15.7 >10  Left Sup Peroneal Anti Sensory (Ant Lat Mall)  32.2C  12 cm NR  <4.6  >3  Left Sural Anti Sensory (Lat Mall)  Calf NR  <4.6  >3  Left Ulnar Anti Sensory (5th Digit)  32.2C  Wrist NR  <3.2  >5   Motor Summary Table   Stim Site NR Onset (ms) Norm Onset (ms) O-P Amp (mV) Norm O-P Amp Site1 Site2 Delta-0 (ms) Dist (cm) Vel (m/s) Norm Vel (m/s)  Left Median Motor (Abd Poll Brev)  32.2C  Wrist    3.9 <4.0 6.5 >5 Elbow Wrist 5.2 30.0 58 >50  Elbow    9.1  5.6         Left Peroneal Motor (Ext Dig Brev)  32.2C  Ankle    4.1 <6.0 2.7 >2.5 B Fib Ankle 9.1 36.0 40 >40  B Fib    13.2  2.1  Poplt B Fib 2.2 10.0 45 >40  Poplt    15.4  2.1         Left Tibial Motor (Abd Hall Brev)  32.2C  Ankle    4.6 <6.0 7.0 >4 Knee Ankle 10.9 45.0 41 >40  Knee    15.5  4.8         Left Ulnar Motor (Abd Dig Minimi)  32.2C  Wrist    2.4 <3.1 8.7 >7 B Elbow Wrist 4.7 26.0 55 >50  B Elbow    7.1  6.6  A Elbow B Elbow 2.2 10.0 45 >50  A Elbow  9.3  6.2          H Reflex Studies   NR H-Lat (ms) Lat Norm (ms) L-R H-Lat (ms)  Left Tibial (Gastroc)  32.2C  NR  <35    EMG   Side Muscle Ins Act Fibs Psw Fasc Number Recrt Dur Dur. Amp Amp. Poly Poly. Comment  Left 1stDorInt Nml Nml Nml Nml 1- Mod-R Some 1+ Nml Nml Nml Nml N/A  Left Abd Dig Min Nml Nml Nml Nml 1- Rapid Some 1+ Nml Nml Nml Nml N/A  Left Abd Poll Brev Nml Nml Nml Nml 1- Rapid Some 1+ Nml Nml Nml Nml N/A  Left Ext Indicis _0  _1  Nml Nml N/A  Left PronatorTeres _2  _3  Nml Nml N/A  Left Biceps _4  _5  Nml Nml N/A  Left Triceps _6  _7  Nml Nml N/A  Left AntTibialis Nml Nml Nml Nml 1- Rapid Some 1+ Some 1+ Nml Nml N/A  Left Gastroc Nml Nml Nml Nml 1- Rapid  Some 1+ Nml Nml Nml Nml N/A  Left RectFemoris Nml Nml Nml Nml 1- Mod-R Few 1+ Nml Nml Nml Nml N/A  Left BicepsFemS Nml Nml Nml Nml 1- Mod-R Few 1+ Nml Nml Nml Nml N/A      Waveforms:

## 2014-09-24 ENCOUNTER — Telehealth: Payer: Self-pay | Admitting: Neurology

## 2014-09-24 DIAGNOSIS — G609 Hereditary and idiopathic neuropathy, unspecified: Secondary | ICD-10-CM

## 2014-09-24 NOTE — Telephone Encounter (Signed)
-----  Message from [place], DO sent at 09/23/2014  4:06 PM EDT ----- Tell pt/wife that has significant peripheral neuropathy.  PT may help for balance, OT for dexterity in hands.

## 2014-09-24 NOTE — Telephone Encounter (Signed)
Patient made aware and he would like referral. He is asking for home health. Referral to [facility] for PT/OT faxed to [phone] with confirmation received. They will contact patient.

## 2014-09-25 ENCOUNTER — Encounter (HOSPITAL_COMMUNITY): Payer: Medicare Other

## 2014-09-26 ENCOUNTER — Other Ambulatory Visit: Payer: Self-pay | Admitting: Family Medicine

## 2014-09-26 ENCOUNTER — Other Ambulatory Visit: Payer: Self-pay

## 2014-09-26 MED ORDER — TRAZODONE HCL 100 MG PO TABS: 200.0000 mg | ORAL_TABLET | Freq: Every day | ORAL | Status: DC

## 2014-09-26 NOTE — Telephone Encounter (Signed)
Waiting on MD response.

## 2014-09-26 NOTE — Telephone Encounter (Signed)
Please clarify, I thought he had 200mg qhs prn.   Let me know. Thanks.  

## 2014-09-26 NOTE — Telephone Encounter (Signed)
I will fix this.

## 2014-09-26 NOTE — Progress Notes (Signed)
Sent. Thanks.   

## 2014-09-26 NOTE — Telephone Encounter (Signed)
Please clarify, I thought he had 257m qhs prn.   Let me know. Thanks.

## 2014-09-26 NOTE — Telephone Encounter (Signed)
Thanks

## 2014-09-26 NOTE — Progress Notes (Signed)
Please notify pt that rx was sent.   I tried to call pt, couldn't LMOVM.   Thanks.

## 2014-09-27 DIAGNOSIS — J449 Chronic obstructive pulmonary disease, unspecified: Secondary | ICD-10-CM | POA: Diagnosis not present

## 2014-09-27 DIAGNOSIS — I10 Essential (primary) hypertension: Secondary | ICD-10-CM | POA: Diagnosis not present

## 2014-09-27 DIAGNOSIS — M6281 Muscle weakness (generalized): Secondary | ICD-10-CM | POA: Diagnosis not present

## 2014-09-27 DIAGNOSIS — R262 Difficulty in walking, not elsewhere classified: Secondary | ICD-10-CM | POA: Diagnosis not present

## 2014-09-27 DIAGNOSIS — S46011D Strain of muscle(s) and tendon(s) of the rotator cuff of right shoulder, subsequent encounter: Secondary | ICD-10-CM | POA: Diagnosis not present

## 2014-09-29 DIAGNOSIS — I10 Essential (primary) hypertension: Secondary | ICD-10-CM | POA: Diagnosis not present

## 2014-09-29 DIAGNOSIS — J449 Chronic obstructive pulmonary disease, unspecified: Secondary | ICD-10-CM | POA: Diagnosis not present

## 2014-09-29 DIAGNOSIS — R262 Difficulty in walking, not elsewhere classified: Secondary | ICD-10-CM | POA: Diagnosis not present

## 2014-09-29 DIAGNOSIS — M6281 Muscle weakness (generalized): Secondary | ICD-10-CM | POA: Diagnosis not present

## 2014-09-29 DIAGNOSIS — M25511 Pain in right shoulder: Secondary | ICD-10-CM | POA: Diagnosis not present

## 2014-09-29 DIAGNOSIS — S46011D Strain of muscle(s) and tendon(s) of the rotator cuff of right shoulder, subsequent encounter: Secondary | ICD-10-CM | POA: Diagnosis not present

## 2014-09-30 ENCOUNTER — Encounter (HOSPITAL_COMMUNITY): Payer: Medicare Other

## 2014-09-30 DIAGNOSIS — M545 Low back pain: Secondary | ICD-10-CM | POA: Diagnosis not present

## 2014-09-30 DIAGNOSIS — M5126 Other intervertebral disc displacement, lumbar region: Secondary | ICD-10-CM | POA: Diagnosis not present

## 2014-10-01 DIAGNOSIS — I10 Essential (primary) hypertension: Secondary | ICD-10-CM | POA: Diagnosis not present

## 2014-10-01 DIAGNOSIS — J449 Chronic obstructive pulmonary disease, unspecified: Secondary | ICD-10-CM | POA: Diagnosis not present

## 2014-10-01 DIAGNOSIS — R262 Difficulty in walking, not elsewhere classified: Secondary | ICD-10-CM | POA: Diagnosis not present

## 2014-10-01 DIAGNOSIS — S46011D Strain of muscle(s) and tendon(s) of the rotator cuff of right shoulder, subsequent encounter: Secondary | ICD-10-CM | POA: Diagnosis not present

## 2014-10-01 DIAGNOSIS — M6281 Muscle weakness (generalized): Secondary | ICD-10-CM | POA: Diagnosis not present

## 2014-10-02 ENCOUNTER — Encounter (HOSPITAL_COMMUNITY): Payer: Medicare Other

## 2014-10-03 DIAGNOSIS — M6281 Muscle weakness (generalized): Secondary | ICD-10-CM | POA: Diagnosis not present

## 2014-10-03 DIAGNOSIS — S46011D Strain of muscle(s) and tendon(s) of the rotator cuff of right shoulder, subsequent encounter: Secondary | ICD-10-CM | POA: Diagnosis not present

## 2014-10-03 DIAGNOSIS — J449 Chronic obstructive pulmonary disease, unspecified: Secondary | ICD-10-CM | POA: Diagnosis not present

## 2014-10-03 DIAGNOSIS — I10 Essential (primary) hypertension: Secondary | ICD-10-CM | POA: Diagnosis not present

## 2014-10-03 DIAGNOSIS — R262 Difficulty in walking, not elsewhere classified: Secondary | ICD-10-CM | POA: Diagnosis not present

## 2014-10-04 DIAGNOSIS — M6281 Muscle weakness (generalized): Secondary | ICD-10-CM | POA: Diagnosis not present

## 2014-10-04 DIAGNOSIS — J449 Chronic obstructive pulmonary disease, unspecified: Secondary | ICD-10-CM | POA: Diagnosis not present

## 2014-10-04 DIAGNOSIS — I10 Essential (primary) hypertension: Secondary | ICD-10-CM | POA: Diagnosis not present

## 2014-10-04 DIAGNOSIS — R262 Difficulty in walking, not elsewhere classified: Secondary | ICD-10-CM | POA: Diagnosis not present

## 2014-10-04 DIAGNOSIS — S46011D Strain of muscle(s) and tendon(s) of the rotator cuff of right shoulder, subsequent encounter: Secondary | ICD-10-CM | POA: Diagnosis not present

## 2014-10-06 DIAGNOSIS — J449 Chronic obstructive pulmonary disease, unspecified: Secondary | ICD-10-CM | POA: Diagnosis not present

## 2014-10-06 DIAGNOSIS — M6281 Muscle weakness (generalized): Secondary | ICD-10-CM | POA: Diagnosis not present

## 2014-10-06 DIAGNOSIS — S46011D Strain of muscle(s) and tendon(s) of the rotator cuff of right shoulder, subsequent encounter: Secondary | ICD-10-CM | POA: Diagnosis not present

## 2014-10-06 DIAGNOSIS — R262 Difficulty in walking, not elsewhere classified: Secondary | ICD-10-CM | POA: Diagnosis not present

## 2014-10-06 DIAGNOSIS — I10 Essential (primary) hypertension: Secondary | ICD-10-CM | POA: Diagnosis not present

## 2014-10-07 ENCOUNTER — Encounter: Payer: Self-pay | Admitting: Neurology

## 2014-10-07 ENCOUNTER — Encounter (HOSPITAL_COMMUNITY): Payer: Medicare Other

## 2014-10-08 DIAGNOSIS — M6281 Muscle weakness (generalized): Secondary | ICD-10-CM | POA: Diagnosis not present

## 2014-10-08 DIAGNOSIS — S46011D Strain of muscle(s) and tendon(s) of the rotator cuff of right shoulder, subsequent encounter: Secondary | ICD-10-CM | POA: Diagnosis not present

## 2014-10-08 DIAGNOSIS — R262 Difficulty in walking, not elsewhere classified: Secondary | ICD-10-CM | POA: Diagnosis not present

## 2014-10-08 DIAGNOSIS — I10 Essential (primary) hypertension: Secondary | ICD-10-CM | POA: Diagnosis not present

## 2014-10-08 DIAGNOSIS — J449 Chronic obstructive pulmonary disease, unspecified: Secondary | ICD-10-CM | POA: Diagnosis not present

## 2014-10-09 ENCOUNTER — Encounter (HOSPITAL_COMMUNITY): Payer: Medicare Other

## 2014-10-09 NOTE — Addendum Note (Signed)
Encounter addended by: Benedetto Goad, RN on: 10/09/2014  8:19 AM<BR>     Documentation filed: Notes Section

## 2014-10-09 NOTE — Progress Notes (Signed)
Pulmonary Rehab discharge Note: [name] has been discharged from pulmonary rehab after completing 8 exercise sessions related to new onset hip pain and generalized weakness per his request. This is being worked up by his primary care physician. Unfortunately [name] did not meet any of his rehab goals because of the limited amount of time in the program. [name] has been encouraged to exercise at home as much as possible and instructed to notify pulmonary rehab for readmission when ready.

## 2014-10-10 DIAGNOSIS — I10 Essential (primary) hypertension: Secondary | ICD-10-CM | POA: Diagnosis not present

## 2014-10-10 DIAGNOSIS — S46011D Strain of muscle(s) and tendon(s) of the rotator cuff of right shoulder, subsequent encounter: Secondary | ICD-10-CM | POA: Diagnosis not present

## 2014-10-10 DIAGNOSIS — J449 Chronic obstructive pulmonary disease, unspecified: Secondary | ICD-10-CM | POA: Diagnosis not present

## 2014-10-10 DIAGNOSIS — R262 Difficulty in walking, not elsewhere classified: Secondary | ICD-10-CM | POA: Diagnosis not present

## 2014-10-10 DIAGNOSIS — M6281 Muscle weakness (generalized): Secondary | ICD-10-CM | POA: Diagnosis not present

## 2014-10-11 DIAGNOSIS — R262 Difficulty in walking, not elsewhere classified: Secondary | ICD-10-CM | POA: Diagnosis not present

## 2014-10-11 DIAGNOSIS — S46011D Strain of muscle(s) and tendon(s) of the rotator cuff of right shoulder, subsequent encounter: Secondary | ICD-10-CM | POA: Diagnosis not present

## 2014-10-11 DIAGNOSIS — J449 Chronic obstructive pulmonary disease, unspecified: Secondary | ICD-10-CM | POA: Diagnosis not present

## 2014-10-11 DIAGNOSIS — I10 Essential (primary) hypertension: Secondary | ICD-10-CM | POA: Diagnosis not present

## 2014-10-11 DIAGNOSIS — M6281 Muscle weakness (generalized): Secondary | ICD-10-CM | POA: Diagnosis not present

## 2014-10-13 DIAGNOSIS — J449 Chronic obstructive pulmonary disease, unspecified: Secondary | ICD-10-CM | POA: Diagnosis not present

## 2014-10-13 DIAGNOSIS — S46011D Strain of muscle(s) and tendon(s) of the rotator cuff of right shoulder, subsequent encounter: Secondary | ICD-10-CM | POA: Diagnosis not present

## 2014-10-13 DIAGNOSIS — R262 Difficulty in walking, not elsewhere classified: Secondary | ICD-10-CM | POA: Diagnosis not present

## 2014-10-13 DIAGNOSIS — I10 Essential (primary) hypertension: Secondary | ICD-10-CM | POA: Diagnosis not present

## 2014-10-13 DIAGNOSIS — M6281 Muscle weakness (generalized): Secondary | ICD-10-CM | POA: Diagnosis not present

## 2014-10-14 ENCOUNTER — Encounter (HOSPITAL_COMMUNITY): Payer: Medicare Other

## 2014-10-15 DIAGNOSIS — R262 Difficulty in walking, not elsewhere classified: Secondary | ICD-10-CM | POA: Diagnosis not present

## 2014-10-15 DIAGNOSIS — J449 Chronic obstructive pulmonary disease, unspecified: Secondary | ICD-10-CM | POA: Diagnosis not present

## 2014-10-15 DIAGNOSIS — I10 Essential (primary) hypertension: Secondary | ICD-10-CM | POA: Diagnosis not present

## 2014-10-15 DIAGNOSIS — S46011D Strain of muscle(s) and tendon(s) of the rotator cuff of right shoulder, subsequent encounter: Secondary | ICD-10-CM | POA: Diagnosis not present

## 2014-10-15 DIAGNOSIS — M6281 Muscle weakness (generalized): Secondary | ICD-10-CM | POA: Diagnosis not present

## 2014-10-16 ENCOUNTER — Encounter (HOSPITAL_COMMUNITY): Payer: Medicare Other

## 2014-10-16 DIAGNOSIS — M545 Low back pain: Secondary | ICD-10-CM | POA: Diagnosis not present

## 2014-10-16 DIAGNOSIS — M5126 Other intervertebral disc displacement, lumbar region: Secondary | ICD-10-CM | POA: Diagnosis not present

## 2014-10-17 DIAGNOSIS — S46011D Strain of muscle(s) and tendon(s) of the rotator cuff of right shoulder, subsequent encounter: Secondary | ICD-10-CM | POA: Diagnosis not present

## 2014-10-17 DIAGNOSIS — M6281 Muscle weakness (generalized): Secondary | ICD-10-CM | POA: Diagnosis not present

## 2014-10-17 DIAGNOSIS — J449 Chronic obstructive pulmonary disease, unspecified: Secondary | ICD-10-CM | POA: Diagnosis not present

## 2014-10-17 DIAGNOSIS — I10 Essential (primary) hypertension: Secondary | ICD-10-CM | POA: Diagnosis not present

## 2014-10-17 DIAGNOSIS — R262 Difficulty in walking, not elsewhere classified: Secondary | ICD-10-CM | POA: Diagnosis not present

## 2014-10-18 DIAGNOSIS — I10 Essential (primary) hypertension: Secondary | ICD-10-CM | POA: Diagnosis not present

## 2014-10-18 DIAGNOSIS — M6281 Muscle weakness (generalized): Secondary | ICD-10-CM | POA: Diagnosis not present

## 2014-10-18 DIAGNOSIS — J449 Chronic obstructive pulmonary disease, unspecified: Secondary | ICD-10-CM | POA: Diagnosis not present

## 2014-10-18 DIAGNOSIS — S46011D Strain of muscle(s) and tendon(s) of the rotator cuff of right shoulder, subsequent encounter: Secondary | ICD-10-CM | POA: Diagnosis not present

## 2014-10-18 DIAGNOSIS — R262 Difficulty in walking, not elsewhere classified: Secondary | ICD-10-CM | POA: Diagnosis not present

## 2014-10-20 DIAGNOSIS — M6281 Muscle weakness (generalized): Secondary | ICD-10-CM | POA: Diagnosis not present

## 2014-10-20 DIAGNOSIS — R262 Difficulty in walking, not elsewhere classified: Secondary | ICD-10-CM | POA: Diagnosis not present

## 2014-10-20 DIAGNOSIS — J449 Chronic obstructive pulmonary disease, unspecified: Secondary | ICD-10-CM | POA: Diagnosis not present

## 2014-10-20 DIAGNOSIS — S46011D Strain of muscle(s) and tendon(s) of the rotator cuff of right shoulder, subsequent encounter: Secondary | ICD-10-CM | POA: Diagnosis not present

## 2014-10-20 DIAGNOSIS — I10 Essential (primary) hypertension: Secondary | ICD-10-CM | POA: Diagnosis not present

## 2014-10-21 ENCOUNTER — Other Ambulatory Visit: Payer: Self-pay | Admitting: Family Medicine

## 2014-10-21 ENCOUNTER — Encounter (HOSPITAL_COMMUNITY): Payer: Medicare Other

## 2014-10-21 NOTE — Telephone Encounter (Signed)
Please call in.  Thanks.   

## 2014-10-21 NOTE — Telephone Encounter (Signed)
Received refill request electronically from pharmacy Last refill 08/14/14 #30/1, last office visit same day Is it okay to refill?

## 2014-10-21 NOTE — Telephone Encounter (Signed)
Rx called in as directed.

## 2014-10-22 DIAGNOSIS — R262 Difficulty in walking, not elsewhere classified: Secondary | ICD-10-CM | POA: Diagnosis not present

## 2014-10-22 DIAGNOSIS — J449 Chronic obstructive pulmonary disease, unspecified: Secondary | ICD-10-CM | POA: Diagnosis not present

## 2014-10-22 DIAGNOSIS — I10 Essential (primary) hypertension: Secondary | ICD-10-CM | POA: Diagnosis not present

## 2014-10-22 DIAGNOSIS — M6281 Muscle weakness (generalized): Secondary | ICD-10-CM | POA: Diagnosis not present

## 2014-10-22 DIAGNOSIS — S46011D Strain of muscle(s) and tendon(s) of the rotator cuff of right shoulder, subsequent encounter: Secondary | ICD-10-CM | POA: Diagnosis not present

## 2014-10-23 ENCOUNTER — Encounter (HOSPITAL_COMMUNITY): Payer: Medicare Other

## 2014-10-24 DIAGNOSIS — S46011D Strain of muscle(s) and tendon(s) of the rotator cuff of right shoulder, subsequent encounter: Secondary | ICD-10-CM | POA: Diagnosis not present

## 2014-10-24 DIAGNOSIS — J449 Chronic obstructive pulmonary disease, unspecified: Secondary | ICD-10-CM | POA: Diagnosis not present

## 2014-10-24 DIAGNOSIS — I10 Essential (primary) hypertension: Secondary | ICD-10-CM | POA: Diagnosis not present

## 2014-10-24 DIAGNOSIS — M6281 Muscle weakness (generalized): Secondary | ICD-10-CM | POA: Diagnosis not present

## 2014-10-24 DIAGNOSIS — R262 Difficulty in walking, not elsewhere classified: Secondary | ICD-10-CM | POA: Diagnosis not present

## 2014-10-25 DIAGNOSIS — J449 Chronic obstructive pulmonary disease, unspecified: Secondary | ICD-10-CM | POA: Diagnosis not present

## 2014-10-25 DIAGNOSIS — R262 Difficulty in walking, not elsewhere classified: Secondary | ICD-10-CM | POA: Diagnosis not present

## 2014-10-25 DIAGNOSIS — M6281 Muscle weakness (generalized): Secondary | ICD-10-CM | POA: Diagnosis not present

## 2014-10-25 DIAGNOSIS — S46011D Strain of muscle(s) and tendon(s) of the rotator cuff of right shoulder, subsequent encounter: Secondary | ICD-10-CM | POA: Diagnosis not present

## 2014-10-25 DIAGNOSIS — I10 Essential (primary) hypertension: Secondary | ICD-10-CM | POA: Diagnosis not present

## 2014-10-27 DIAGNOSIS — I10 Essential (primary) hypertension: Secondary | ICD-10-CM | POA: Diagnosis not present

## 2014-10-27 DIAGNOSIS — M6281 Muscle weakness (generalized): Secondary | ICD-10-CM | POA: Diagnosis not present

## 2014-10-27 DIAGNOSIS — J449 Chronic obstructive pulmonary disease, unspecified: Secondary | ICD-10-CM | POA: Diagnosis not present

## 2014-10-27 DIAGNOSIS — S46011D Strain of muscle(s) and tendon(s) of the rotator cuff of right shoulder, subsequent encounter: Secondary | ICD-10-CM | POA: Diagnosis not present

## 2014-10-27 DIAGNOSIS — R262 Difficulty in walking, not elsewhere classified: Secondary | ICD-10-CM | POA: Diagnosis not present

## 2014-10-28 ENCOUNTER — Encounter (HOSPITAL_COMMUNITY): Payer: Medicare Other

## 2014-10-29 DIAGNOSIS — J449 Chronic obstructive pulmonary disease, unspecified: Secondary | ICD-10-CM | POA: Diagnosis not present

## 2014-10-29 DIAGNOSIS — S46011D Strain of muscle(s) and tendon(s) of the rotator cuff of right shoulder, subsequent encounter: Secondary | ICD-10-CM | POA: Diagnosis not present

## 2014-10-29 DIAGNOSIS — M6281 Muscle weakness (generalized): Secondary | ICD-10-CM | POA: Diagnosis not present

## 2014-10-29 DIAGNOSIS — I10 Essential (primary) hypertension: Secondary | ICD-10-CM | POA: Diagnosis not present

## 2014-10-29 DIAGNOSIS — R262 Difficulty in walking, not elsewhere classified: Secondary | ICD-10-CM | POA: Diagnosis not present

## 2014-10-30 ENCOUNTER — Encounter (HOSPITAL_COMMUNITY): Payer: Medicare Other

## 2014-10-31 DIAGNOSIS — M6281 Muscle weakness (generalized): Secondary | ICD-10-CM | POA: Diagnosis not present

## 2014-10-31 DIAGNOSIS — R262 Difficulty in walking, not elsewhere classified: Secondary | ICD-10-CM | POA: Diagnosis not present

## 2014-10-31 DIAGNOSIS — S46011D Strain of muscle(s) and tendon(s) of the rotator cuff of right shoulder, subsequent encounter: Secondary | ICD-10-CM | POA: Diagnosis not present

## 2014-10-31 DIAGNOSIS — J449 Chronic obstructive pulmonary disease, unspecified: Secondary | ICD-10-CM | POA: Diagnosis not present

## 2014-10-31 DIAGNOSIS — I10 Essential (primary) hypertension: Secondary | ICD-10-CM | POA: Diagnosis not present

## 2014-11-01 DIAGNOSIS — I10 Essential (primary) hypertension: Secondary | ICD-10-CM | POA: Diagnosis not present

## 2014-11-01 DIAGNOSIS — J449 Chronic obstructive pulmonary disease, unspecified: Secondary | ICD-10-CM | POA: Diagnosis not present

## 2014-11-01 DIAGNOSIS — M6281 Muscle weakness (generalized): Secondary | ICD-10-CM | POA: Diagnosis not present

## 2014-11-01 DIAGNOSIS — R262 Difficulty in walking, not elsewhere classified: Secondary | ICD-10-CM | POA: Diagnosis not present

## 2014-11-01 DIAGNOSIS — S46011D Strain of muscle(s) and tendon(s) of the rotator cuff of right shoulder, subsequent encounter: Secondary | ICD-10-CM | POA: Diagnosis not present

## 2014-11-03 DIAGNOSIS — R262 Difficulty in walking, not elsewhere classified: Secondary | ICD-10-CM | POA: Diagnosis not present

## 2014-11-03 DIAGNOSIS — J449 Chronic obstructive pulmonary disease, unspecified: Secondary | ICD-10-CM | POA: Diagnosis not present

## 2014-11-03 DIAGNOSIS — S46011D Strain of muscle(s) and tendon(s) of the rotator cuff of right shoulder, subsequent encounter: Secondary | ICD-10-CM | POA: Diagnosis not present

## 2014-11-03 DIAGNOSIS — I10 Essential (primary) hypertension: Secondary | ICD-10-CM | POA: Diagnosis not present

## 2014-11-03 DIAGNOSIS — M6281 Muscle weakness (generalized): Secondary | ICD-10-CM | POA: Diagnosis not present

## 2014-11-04 ENCOUNTER — Encounter (HOSPITAL_COMMUNITY): Payer: Medicare Other

## 2014-11-05 DIAGNOSIS — S46011D Strain of muscle(s) and tendon(s) of the rotator cuff of right shoulder, subsequent encounter: Secondary | ICD-10-CM | POA: Diagnosis not present

## 2014-11-05 DIAGNOSIS — R262 Difficulty in walking, not elsewhere classified: Secondary | ICD-10-CM | POA: Diagnosis not present

## 2014-11-05 DIAGNOSIS — J449 Chronic obstructive pulmonary disease, unspecified: Secondary | ICD-10-CM | POA: Diagnosis not present

## 2014-11-05 DIAGNOSIS — I10 Essential (primary) hypertension: Secondary | ICD-10-CM | POA: Diagnosis not present

## 2014-11-05 DIAGNOSIS — M6281 Muscle weakness (generalized): Secondary | ICD-10-CM | POA: Diagnosis not present

## 2014-11-06 ENCOUNTER — Encounter (HOSPITAL_COMMUNITY): Payer: Medicare Other

## 2014-11-07 DIAGNOSIS — R262 Difficulty in walking, not elsewhere classified: Secondary | ICD-10-CM | POA: Diagnosis not present

## 2014-11-07 DIAGNOSIS — M6281 Muscle weakness (generalized): Secondary | ICD-10-CM | POA: Diagnosis not present

## 2014-11-07 DIAGNOSIS — I10 Essential (primary) hypertension: Secondary | ICD-10-CM | POA: Diagnosis not present

## 2014-11-07 DIAGNOSIS — J449 Chronic obstructive pulmonary disease, unspecified: Secondary | ICD-10-CM | POA: Diagnosis not present

## 2014-11-07 DIAGNOSIS — S46011D Strain of muscle(s) and tendon(s) of the rotator cuff of right shoulder, subsequent encounter: Secondary | ICD-10-CM | POA: Diagnosis not present

## 2014-11-10 DIAGNOSIS — M6281 Muscle weakness (generalized): Secondary | ICD-10-CM | POA: Diagnosis not present

## 2014-11-10 DIAGNOSIS — I10 Essential (primary) hypertension: Secondary | ICD-10-CM | POA: Diagnosis not present

## 2014-11-10 DIAGNOSIS — R262 Difficulty in walking, not elsewhere classified: Secondary | ICD-10-CM | POA: Diagnosis not present

## 2014-11-10 DIAGNOSIS — J449 Chronic obstructive pulmonary disease, unspecified: Secondary | ICD-10-CM | POA: Diagnosis not present

## 2014-11-10 DIAGNOSIS — S46011D Strain of muscle(s) and tendon(s) of the rotator cuff of right shoulder, subsequent encounter: Secondary | ICD-10-CM | POA: Diagnosis not present

## 2014-11-11 ENCOUNTER — Encounter (HOSPITAL_COMMUNITY): Payer: Medicare Other

## 2014-11-12 DIAGNOSIS — R262 Difficulty in walking, not elsewhere classified: Secondary | ICD-10-CM | POA: Diagnosis not present

## 2014-11-12 DIAGNOSIS — S46011D Strain of muscle(s) and tendon(s) of the rotator cuff of right shoulder, subsequent encounter: Secondary | ICD-10-CM | POA: Diagnosis not present

## 2014-11-12 DIAGNOSIS — J449 Chronic obstructive pulmonary disease, unspecified: Secondary | ICD-10-CM | POA: Diagnosis not present

## 2014-11-12 DIAGNOSIS — M6281 Muscle weakness (generalized): Secondary | ICD-10-CM | POA: Diagnosis not present

## 2014-11-12 DIAGNOSIS — I10 Essential (primary) hypertension: Secondary | ICD-10-CM | POA: Diagnosis not present

## 2014-11-13 ENCOUNTER — Encounter (HOSPITAL_COMMUNITY): Payer: Medicare Other

## 2014-11-14 DIAGNOSIS — I10 Essential (primary) hypertension: Secondary | ICD-10-CM | POA: Diagnosis not present

## 2014-11-14 DIAGNOSIS — S46011D Strain of muscle(s) and tendon(s) of the rotator cuff of right shoulder, subsequent encounter: Secondary | ICD-10-CM | POA: Diagnosis not present

## 2014-11-14 DIAGNOSIS — M6281 Muscle weakness (generalized): Secondary | ICD-10-CM | POA: Diagnosis not present

## 2014-11-14 DIAGNOSIS — R262 Difficulty in walking, not elsewhere classified: Secondary | ICD-10-CM | POA: Diagnosis not present

## 2014-11-14 DIAGNOSIS — J449 Chronic obstructive pulmonary disease, unspecified: Secondary | ICD-10-CM | POA: Diagnosis not present

## 2014-11-17 DIAGNOSIS — I10 Essential (primary) hypertension: Secondary | ICD-10-CM | POA: Diagnosis not present

## 2014-11-17 DIAGNOSIS — J449 Chronic obstructive pulmonary disease, unspecified: Secondary | ICD-10-CM | POA: Diagnosis not present

## 2014-11-17 DIAGNOSIS — R262 Difficulty in walking, not elsewhere classified: Secondary | ICD-10-CM | POA: Diagnosis not present

## 2014-11-17 DIAGNOSIS — M6281 Muscle weakness (generalized): Secondary | ICD-10-CM | POA: Diagnosis not present

## 2014-11-17 DIAGNOSIS — S46011D Strain of muscle(s) and tendon(s) of the rotator cuff of right shoulder, subsequent encounter: Secondary | ICD-10-CM | POA: Diagnosis not present

## 2014-11-18 ENCOUNTER — Telehealth: Payer: Self-pay | Admitting: Cardiology

## 2014-11-18 ENCOUNTER — Encounter (HOSPITAL_COMMUNITY): Payer: Medicare Other

## 2014-11-18 DIAGNOSIS — M545 Low back pain: Secondary | ICD-10-CM | POA: Diagnosis not present

## 2014-11-18 DIAGNOSIS — M47817 Spondylosis without myelopathy or radiculopathy, lumbosacral region: Secondary | ICD-10-CM | POA: Diagnosis not present

## 2014-11-18 DIAGNOSIS — M5126 Other intervertebral disc displacement, lumbar region: Secondary | ICD-10-CM | POA: Diagnosis not present

## 2014-11-18 NOTE — Telephone Encounter (Signed)
I will forward to Dr Aundra Dubin for review

## 2014-11-18 NOTE — Telephone Encounter (Signed)
New Message  Pt c/o medication issue:  1. Name of Medication: Pennsaid 2. How are you currently taking this medication (dosage and times per day)? Requesting permission to take the medication   4. What is your medication issue?  Pt wife called states that the pt has had pain in his left gluteal muscle. She states that the pt has seen Dr. Ron Agee with [facility]. Per pt Dr. Ron Agee has recommended Pennsaid, a Topical (gel) instead of having him take medication. e The hesiatant is that the pt is taking xarelto. Is it ok to use this medication? Please call back to discuss.

## 2014-11-18 NOTE — Telephone Encounter (Signed)
Would check with pharmacist to make sure no issues with interaction.

## 2014-11-19 DIAGNOSIS — R262 Difficulty in walking, not elsewhere classified: Secondary | ICD-10-CM | POA: Diagnosis not present

## 2014-11-19 DIAGNOSIS — S46011D Strain of muscle(s) and tendon(s) of the rotator cuff of right shoulder, subsequent encounter: Secondary | ICD-10-CM | POA: Diagnosis not present

## 2014-11-19 DIAGNOSIS — J449 Chronic obstructive pulmonary disease, unspecified: Secondary | ICD-10-CM | POA: Diagnosis not present

## 2014-11-19 DIAGNOSIS — I10 Essential (primary) hypertension: Secondary | ICD-10-CM | POA: Diagnosis not present

## 2014-11-19 DIAGNOSIS — M6281 Muscle weakness (generalized): Secondary | ICD-10-CM | POA: Diagnosis not present

## 2014-11-19 NOTE — Telephone Encounter (Signed)
Since this is just a topical NSAID, will be okay with Xarelto.  Just make sure that he uses the smallest amount possible.

## 2014-11-20 ENCOUNTER — Encounter (HOSPITAL_COMMUNITY): Payer: Medicare Other

## 2014-11-20 NOTE — Telephone Encounter (Signed)
Pt's wife Pamala Hurry notified.

## 2014-11-25 ENCOUNTER — Encounter (HOSPITAL_COMMUNITY): Payer: Medicare Other

## 2014-11-25 DIAGNOSIS — M6281 Muscle weakness (generalized): Secondary | ICD-10-CM | POA: Diagnosis not present

## 2014-11-25 DIAGNOSIS — I10 Essential (primary) hypertension: Secondary | ICD-10-CM | POA: Diagnosis not present

## 2014-11-25 DIAGNOSIS — R262 Difficulty in walking, not elsewhere classified: Secondary | ICD-10-CM | POA: Diagnosis not present

## 2014-11-25 DIAGNOSIS — S46011D Strain of muscle(s) and tendon(s) of the rotator cuff of right shoulder, subsequent encounter: Secondary | ICD-10-CM | POA: Diagnosis not present

## 2014-11-25 DIAGNOSIS — J449 Chronic obstructive pulmonary disease, unspecified: Secondary | ICD-10-CM | POA: Diagnosis not present

## 2014-11-25 IMAGING — CR DG CHEST 2V
2 series · 2 of 2 positions shown · non-contrast
Comparison: July 02, 2013

CLINICAL DATA: Pneumothorax 3 weeks ago.

EXAM:
CHEST  2 VIEW

[w chest pa]
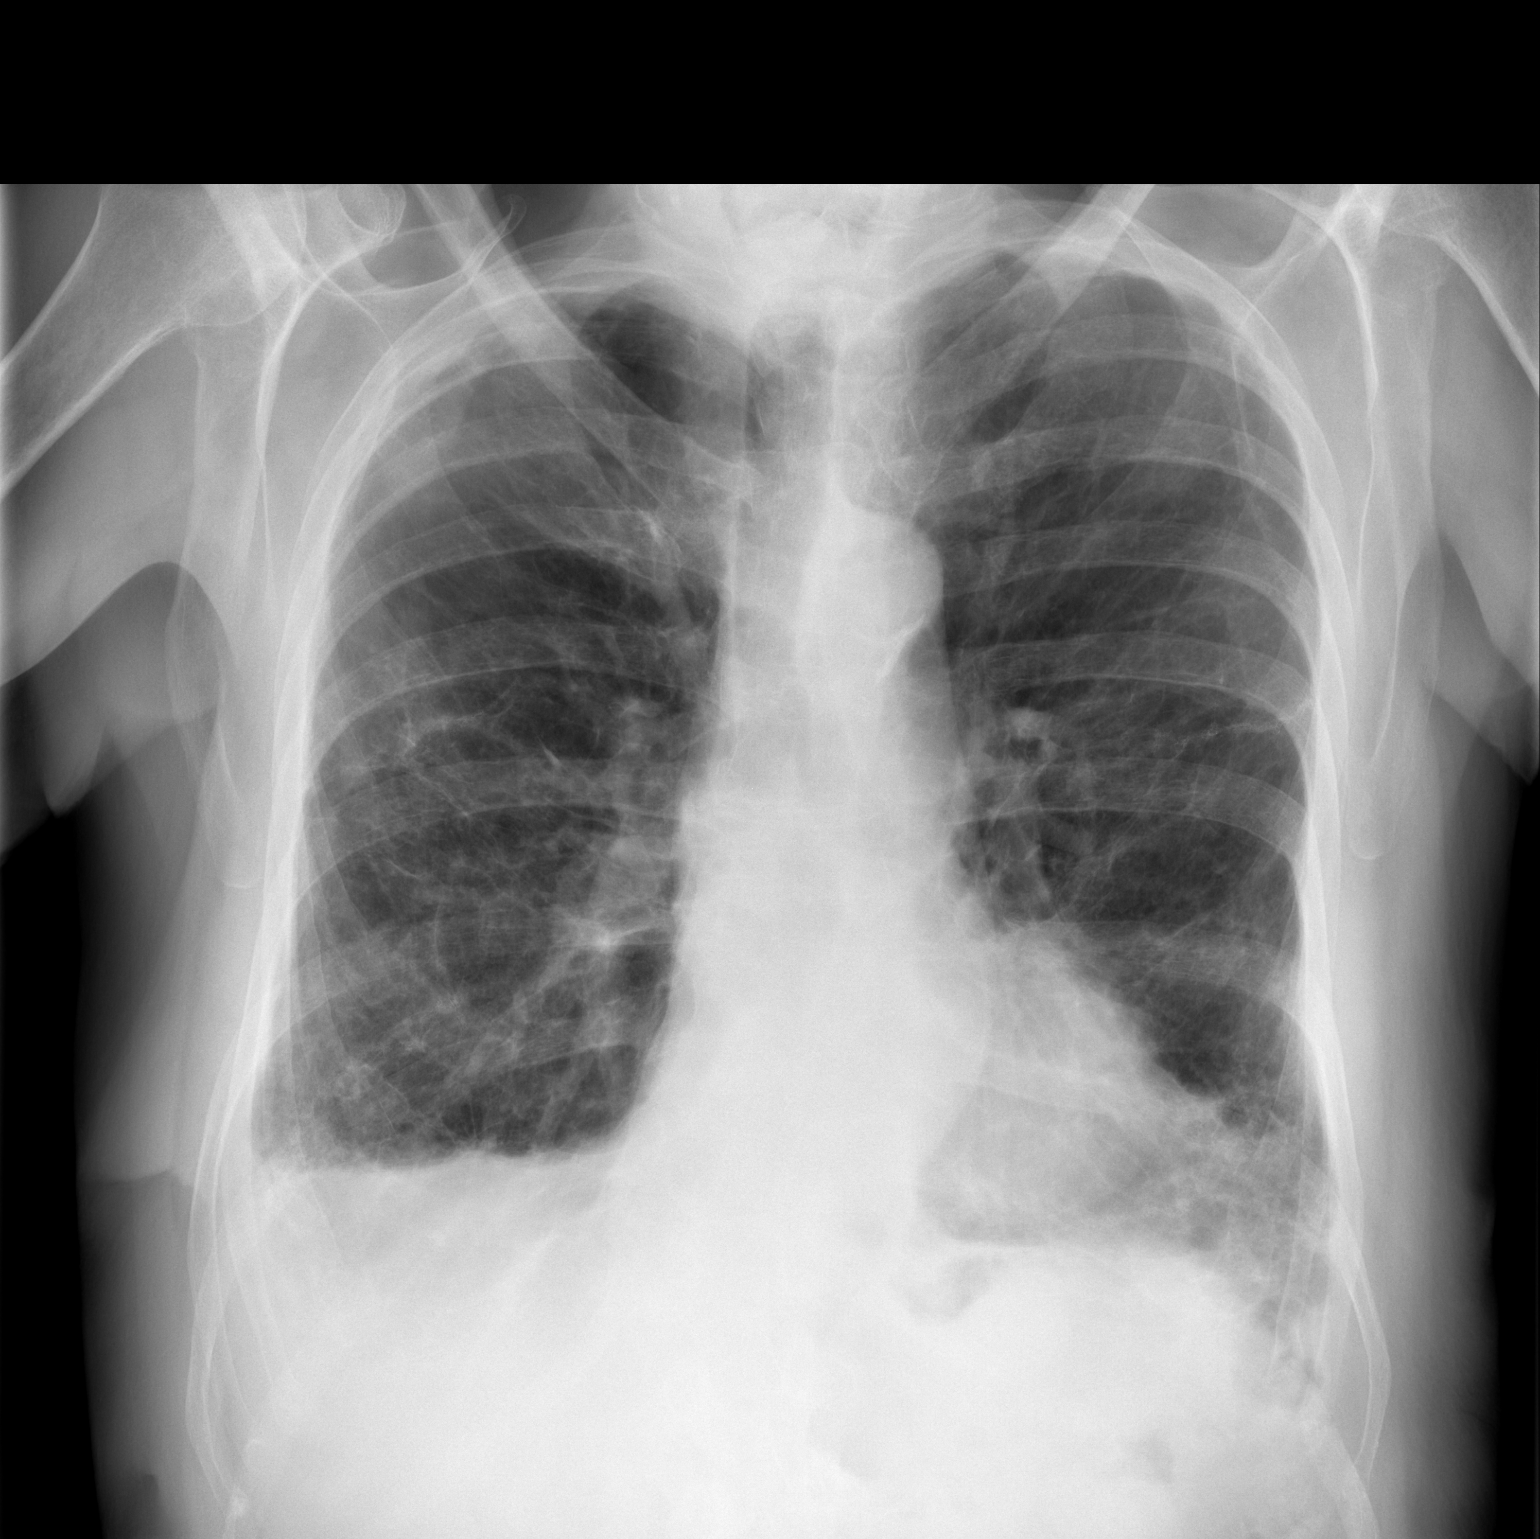

[w chest lat]
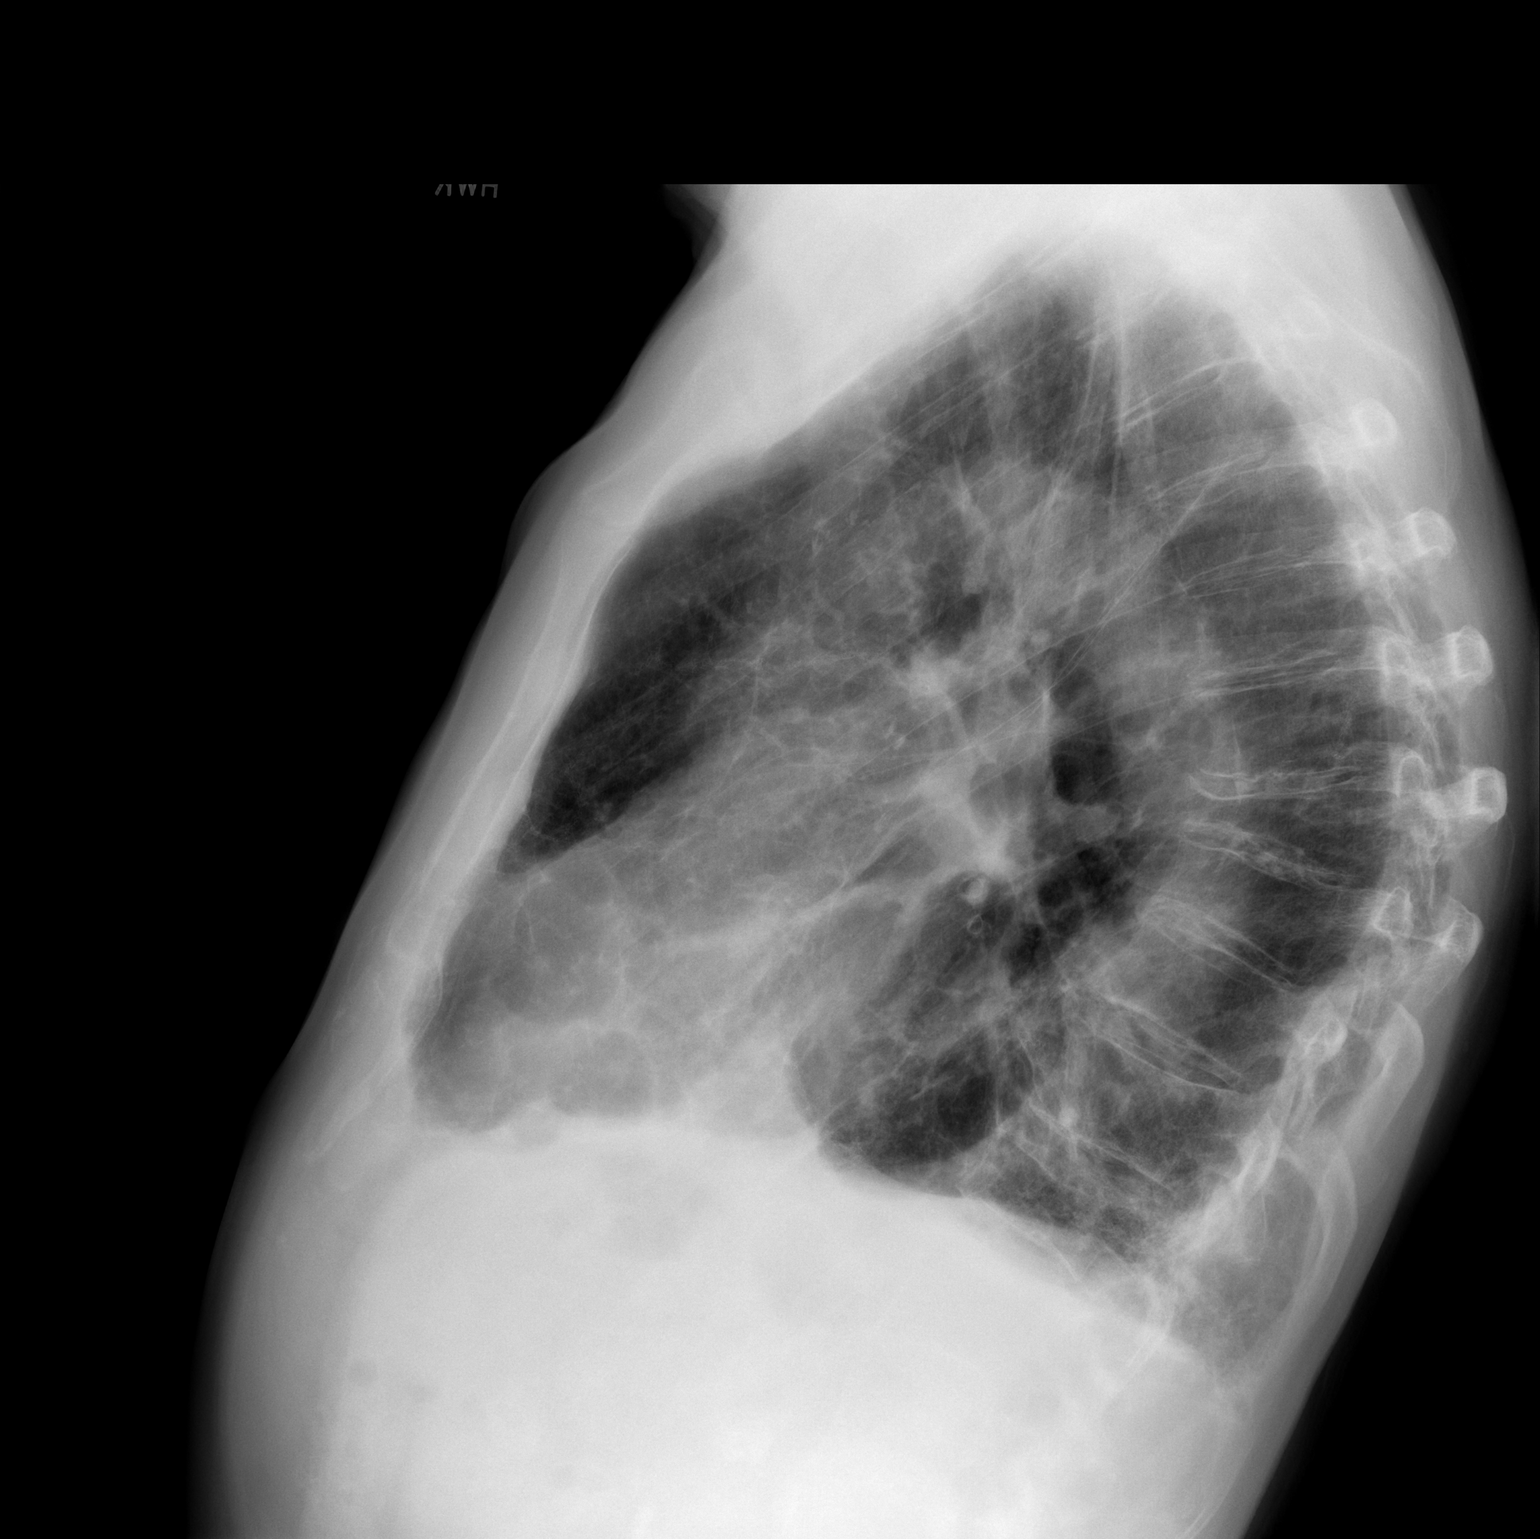

[2 of 2 positions shown; findings below may reference images not displayed]

FINDINGS: The heart size and mediastinal contours are stable. The aorta is
tortuous. The lungs are hyperinflated. There is scarring of
bilateral lungs unchanged. The previously noted right apical
pneumothorax is not seen today. The visualized skeletal structures
are stable.
IMPRESSION: No active cardiopulmonary disease. COPD. Chronic scarring of
bilateral lung bases. Previously noted pneumothorax is not seen.

## 2014-11-27 ENCOUNTER — Encounter (HOSPITAL_COMMUNITY): Payer: Medicare Other

## 2014-12-02 ENCOUNTER — Encounter (HOSPITAL_COMMUNITY): Payer: Medicare Other

## 2014-12-04 ENCOUNTER — Encounter (HOSPITAL_COMMUNITY): Payer: Medicare Other

## 2014-12-04 DIAGNOSIS — J449 Chronic obstructive pulmonary disease, unspecified: Secondary | ICD-10-CM | POA: Diagnosis not present

## 2014-12-09 ENCOUNTER — Encounter (HOSPITAL_COMMUNITY): Payer: Medicare Other

## 2014-12-21 ENCOUNTER — Other Ambulatory Visit: Payer: Self-pay | Admitting: Family Medicine

## 2014-12-22 NOTE — Telephone Encounter (Signed)
Received refill request electronically Last refill 10/21/14 #30/1 Last office visit 08/14/14 Is it okay to refill?

## 2014-12-23 NOTE — Telephone Encounter (Signed)
Medication phoned to pharmacy.

## 2015-01-02 ENCOUNTER — Other Ambulatory Visit: Payer: Self-pay | Admitting: *Deleted

## 2015-01-02 MED ORDER — TAMSULOSIN HCL 0.4 MG PO CAPS: 0.8000 mg | ORAL_CAPSULE | Freq: Every day | ORAL | Status: DC

## 2015-01-02 NOTE — Telephone Encounter (Signed)
Faxed refill request. Patient requests 90 day supply.  Last Filled:    60 tablet 5 09/26/2014  Please advise.

## 2015-01-03 DIAGNOSIS — J449 Chronic obstructive pulmonary disease, unspecified: Secondary | ICD-10-CM | POA: Diagnosis not present

## 2015-01-04 MED ORDER — TRAZODONE HCL 100 MG PO TABS: 200.0000 mg | ORAL_TABLET | Freq: Every day | ORAL | Status: DC

## 2015-01-04 NOTE — Telephone Encounter (Signed)
Sent. Thanks.   

## 2015-01-26 ENCOUNTER — Ambulatory Visit: Payer: Self-pay | Admitting: Family Medicine

## 2015-01-28 ENCOUNTER — Ambulatory Visit (INDEPENDENT_AMBULATORY_CARE_PROVIDER_SITE_OTHER): Payer: Medicare Other | Admitting: Family Medicine

## 2015-01-28 ENCOUNTER — Encounter: Payer: Self-pay | Admitting: Family Medicine

## 2015-01-28 VITALS — BP 122/86 | HR 76 | Temp 97.6°F | Wt 163.0 lb

## 2015-01-28 DIAGNOSIS — J9611 Chronic respiratory failure with hypoxia: Secondary | ICD-10-CM

## 2015-01-28 DIAGNOSIS — R350 Frequency of micturition: Secondary | ICD-10-CM | POA: Diagnosis not present

## 2015-01-28 DIAGNOSIS — L989 Disorder of the skin and subcutaneous tissue, unspecified: Secondary | ICD-10-CM | POA: Diagnosis not present

## 2015-01-28 NOTE — Patient Instructions (Signed)
This could be an small infected area or a skin cancer.  I would use neosporin on the area for a few days.   If worse in the meantime, then let me know.  Either way, update me next week.  If not getting better, then we'll need to make plans for a shave biopsy or a dermatology appointment.  If you need an order for pulmonary rehab or a referral for urology, then let me know.  Take care.  Glad to see you.

## 2015-01-28 NOTE — Progress Notes (Signed)
Pre visit review using our clinic review tool, if applicable. No additional management support is needed unless otherwise documented below in the visit note.  Off spiriva and couldn't tell a difference.  Still on O2.  Rare SABA use.  He was asking about potentially getting back in pulm rehab.  He'll consider.  I'll order if needed.    Red spot on R lower medial shin.  Noted about 1 month ago.  Gradually enlarged in the meantime.  Had been covering with antibiotic cream. Slightly ttp.  No drainage.  No trauma, no clear trigger.   Single lesion.   He still has some urinary frequency, with double dose flomax.  D/w pt.  I offered uro eval, he'll consider.    Meds, vitals, and allergies reviewed.   ROS: See HPI.  Otherwise, noncontributory.  nad On O2 R lower medial shin with lesion noted.  1x1cm scab with surrounding 2x2.5cm blanching mild/faint erythema.   No ulceration or fluctuance.  Not ttp.   Not draining.

## 2015-01-29 NOTE — Assessment & Plan Note (Signed)
He'll update me if wants to go back to pulm rehab.

## 2015-01-29 NOTE — Assessment & Plan Note (Signed)
Doesn't appear to need oral abx.  Could be a slow to heal area vs a SCC.  D/w pt.  Continue topical neosporin for now, update me in a few days.  If not better, then consider shave bx.  We would consider stopping anticoagulation briefly before bx.  We could also refer to derm.  I offered all options.  I wouldn't shave off now, as it may heal in the near future and doesn't look emergent/urgent.   He agrees.

## 2015-01-29 NOTE — Assessment & Plan Note (Signed)
He'll update me if he wants uro referral.  D/w pt.

## 2015-01-30 ENCOUNTER — Telehealth: Payer: Self-pay | Admitting: Family Medicine

## 2015-01-30 MED ORDER — CEPHALEXIN 500 MG PO CAPS: 500.0000 mg | ORAL_CAPSULE | Freq: Four times a day (QID) | ORAL | Status: DC

## 2015-01-30 NOTE — Telephone Encounter (Signed)
Patient's wife called back.  I let her know what Dr.Duncan said and she expressed understanding. I let her know if it continues to spread after taking the antibiotics this weekend, to either go to [facility] or to an urgent care.

## 2015-01-30 NOTE — Telephone Encounter (Signed)
Pt's wife called informing Dr. Damita Dunnings that his leg is still not better. She said it is now weeping and looks infected and redness has spread more. They are wanting to know what to do from here. The best number to contact them is [phone].

## 2015-01-30 NOTE — Telephone Encounter (Signed)
Start keflex.  rx sent.  Have them measure the largest diameter of redness and update me Monday.  If continuing to spread in spite of the abx will need recheck this weekend.   Thanks.

## 2015-02-03 DIAGNOSIS — J449 Chronic obstructive pulmonary disease, unspecified: Secondary | ICD-10-CM | POA: Diagnosis not present

## 2015-02-20 ENCOUNTER — Telehealth: Payer: Self-pay | Admitting: Family Medicine

## 2015-02-20 ENCOUNTER — Encounter: Payer: Self-pay | Admitting: Family Medicine

## 2015-02-20 MED ORDER — CEPHALEXIN 500 MG PO CAPS: 500.0000 mg | ORAL_CAPSULE | Freq: Four times a day (QID) | ORAL | Status: DC

## 2015-02-20 NOTE — Telephone Encounter (Signed)
Pt's wife left voicemail at Triage following up on mychart message, she is requesting a refill of his abx, wife said his leg is looking better but there is another spot below that one that has "come up", pt's pharmacy is [facility] [address]

## 2015-02-20 NOTE — Telephone Encounter (Signed)
See mychart message from patient.  Start keflex. rx sent. Have them measure the largest diameter of redness and update me Monday.  If continuing to spread in spite of the abx will need recheck this weekend. I'm on call, working sat clinic if needed.  Thanks.

## 2015-02-22 ENCOUNTER — Other Ambulatory Visit: Payer: Self-pay | Admitting: Family Medicine

## 2015-02-23 NOTE — Telephone Encounter (Signed)
Thanks.

## 2015-02-23 NOTE — Telephone Encounter (Signed)
Received refill electronically Last refill 12/22/14 #30/1 Last office visit 01/28/15 Is it okay to refill/

## 2015-02-23 NOTE — Telephone Encounter (Signed)
Patient says the spot is looking better.  Patient says that when he took the ABX last time, it took 4 or 5 days for it to look improved but it is already looking better this time.

## 2015-02-24 NOTE — Telephone Encounter (Signed)
Medication phoned to pharmacy.  

## 2015-02-24 NOTE — Telephone Encounter (Signed)
Please call in.  Thanks.   

## 2015-02-25 ENCOUNTER — Ambulatory Visit (INDEPENDENT_AMBULATORY_CARE_PROVIDER_SITE_OTHER): Payer: Medicare Other | Admitting: Family Medicine

## 2015-02-25 ENCOUNTER — Encounter: Payer: Self-pay | Admitting: Family Medicine

## 2015-02-25 VITALS — BP 122/72 | HR 74 | Temp 97.5°F | Wt 165.0 lb

## 2015-02-25 DIAGNOSIS — L84 Corns and callosities: Secondary | ICD-10-CM | POA: Diagnosis not present

## 2015-02-25 DIAGNOSIS — L989 Disorder of the skin and subcutaneous tissue, unspecified: Secondary | ICD-10-CM | POA: Diagnosis not present

## 2015-02-25 DIAGNOSIS — L603 Nail dystrophy: Secondary | ICD-10-CM

## 2015-02-25 NOTE — Patient Instructions (Signed)
Rosaria Ferries will call about your referral.  See her on the way out.  Keep using the antibiotics and update me as needed.  Take care.  Glad to see you.

## 2015-02-25 NOTE — Progress Notes (Signed)
Pre visit review using our clinic review tool, if applicable. No additional management support is needed unless otherwise documented below in the visit note.  First lesions improved with keflex, then another lesion came up on the R leg, farther distal.  Back on kelfex in the meantime.   L foot pain.  H/o toenail fungus on the nails.  He has a hammer toe, one on each foot.  He shoe is rubbing the top of the dorsally angulated toe. No plantar pain.  He has tried toe separators but they don't work well now, with his anatomy.    Meds, vitals, and allergies reviewed.   ROS: See HPI.  Otherwise, noncontributory.  nad B feet with DP pulse intact.  Normal sensation.  B dystrophic nails.  B 2nd toes with hammertoes with dorsal callus but no skin breakdown.  2 lesions on the R lower shin medially, both 1cm across.  The superior lesion is healing and scabbed over.  The other lesions is flat w/o spreading erythema.

## 2015-02-26 NOTE — Assessment & Plan Note (Signed)
Refer to foot clinic for hammertoe eval.  D/w pt about changing to shoes with more room to allow for the toes and prevent rubbing.   D/w pt about skin lesions- continue keflex and he'll update me as needed. These appear to be healing superficial skin infections and not ulcerated/worrisome lesions.  He agrees.

## 2015-03-05 ENCOUNTER — Other Ambulatory Visit: Payer: Self-pay | Admitting: Family Medicine

## 2015-03-06 DIAGNOSIS — J449 Chronic obstructive pulmonary disease, unspecified: Secondary | ICD-10-CM | POA: Diagnosis not present

## 2015-03-06 NOTE — Telephone Encounter (Signed)
Patient contacted and says he has finished the first round of ABX a few days ago and since that time, the area has not gotten worse but has not continued to heal either.  He is wondering if he needs an additional RF of ABX.  Please advise.

## 2015-03-06 NOTE — Telephone Encounter (Signed)
Patient advised.

## 2015-03-06 NOTE — Telephone Encounter (Signed)
Sent.  Thanks. If not better then let me know.

## 2015-03-13 ENCOUNTER — Encounter: Payer: Self-pay | Admitting: Podiatry

## 2015-03-13 ENCOUNTER — Ambulatory Visit (INDEPENDENT_AMBULATORY_CARE_PROVIDER_SITE_OTHER): Payer: Medicare Other | Admitting: Podiatry

## 2015-03-13 VITALS — BP 127/74 | HR 80 | Resp 18

## 2015-03-13 DIAGNOSIS — L84 Corns and callosities: Secondary | ICD-10-CM | POA: Diagnosis not present

## 2015-03-13 DIAGNOSIS — M79676 Pain in unspecified toe(s): Secondary | ICD-10-CM | POA: Diagnosis not present

## 2015-03-13 DIAGNOSIS — B351 Tinea unguium: Secondary | ICD-10-CM | POA: Diagnosis not present

## 2015-03-13 DIAGNOSIS — M2040 Other hammer toe(s) (acquired), unspecified foot: Secondary | ICD-10-CM

## 2015-03-13 NOTE — Progress Notes (Signed)
   Subjective:    Patient ID: [name], male    DOB: Nov 24, 1928, 82 y.o.   MRN: [id number]  HPI  82 year old male presents the operative for concerns of thick, painful, elongated toenails which she could not himself and denies easily redness or drainage. He also has a corn on the left second toe which becomes painful with shoe gear. Denies any drainage or redness or swelling. No other complaints at this time.  Review of Systems  All other systems reviewed and are negative.      Objective:   Physical Exam General: AAO x3, NAD presents with wife  Dermatological: Nails are hypertrophic, dystrophic, brittle, discolored, elongated 10. No swelling erythema or drainage. Tenderness nails on the 5 bilaterally. Hyperkeratotic lesion dorsal left second toe. Upon debridement no underlying ulceration, drainage or other signs of infection. No other open lesions or pre-ulcerative lesions.  Vascular: Dorsalis Pedis artery and Posterior Tibial artery pedal pulses are 2/4 bilateral with immedate capillary fill time. Pedal hair growth present.There is no pain with calf compression, swelling, warmth, erythema.   Neruologic: Grossly intact via light touch bilateral. Vibratory intact via tuning fork bilateral. Protective threshold with Semmes Wienstein monofilament intact to all pedal sites bilateral. Patellar and Achilles deep tendon reflexes 2+ bilateral. No Babinski or clonus noted bilateral.   Musculoskeletal: Hammertoe deformity is are present.  No pain, crepitus, or limitation noted with foot and ankle range of motion bilateral. Muscular strength 5/5 in all groups tested bilateral.  Gait: Unassisted, Nonantalgic.       Assessment & Plan:  82 year old male with symptomatic onychomycosis, hyperkeratotic lesion -Treatment options discussed including all alternatives, risks, and complications -Etiology of symptoms were discussed -Nails debrided 10 without complications or  bleeding. -Hyperkeratotic lesion debrided 1 without complications or bleeding -Daily foot inspection -Follow-up in 3 months or sooner if any problems arise. In the meantime, encouraged to call the office with any questions, concerns, change in symptoms.   Celesta Gentile, DPm

## 2015-03-16 ENCOUNTER — Encounter: Payer: Self-pay | Admitting: Podiatry

## 2015-03-24 ENCOUNTER — Ambulatory Visit (INDEPENDENT_AMBULATORY_CARE_PROVIDER_SITE_OTHER): Payer: Medicare Other | Admitting: Family Medicine

## 2015-03-24 ENCOUNTER — Encounter: Payer: Self-pay | Admitting: Family Medicine

## 2015-03-24 ENCOUNTER — Other Ambulatory Visit: Payer: Self-pay | Admitting: Family Medicine

## 2015-03-24 VITALS — BP 124/74 | HR 78 | Temp 97.4°F | Wt 167.5 lb

## 2015-03-24 DIAGNOSIS — L989 Disorder of the skin and subcutaneous tissue, unspecified: Secondary | ICD-10-CM | POA: Diagnosis not present

## 2015-03-24 DIAGNOSIS — L97919 Non-pressure chronic ulcer of unspecified part of right lower leg with unspecified severity: Secondary | ICD-10-CM

## 2015-03-24 NOTE — Patient Instructions (Signed)
[name] will call about your referral. Take care.  Glad to see you.   You don't need more antibiotics at this point.

## 2015-03-24 NOTE — Assessment & Plan Note (Signed)
The question I have is if this could be from arterial insufficiency.  He doesn't have claudication.  He could have a narrowed arteriole causing relative insufficiency in the area locally but still with an intact DP pulse distally.  Check doppler R leg, arterial doppler.   Keep the areas clean for now.  No need for abx at this point.  D/w pt.  He agrees.  Will await doppler.

## 2015-03-24 NOTE — Progress Notes (Signed)
Pre visit review using our clinic review tool, if applicable. No additional management support is needed unless otherwise documented below in the visit note.  Still with recurrent lesions on the R medial shin.  He had the prev lesion noted, the initial would slowly heal as the second appeared, then the 3rd and 4th.  We thought the lesions were initially healing with keflex rx, but he has then continued to have next lesions.  No fevers.  No claudication sx.  He feels well.  No foot lesions.  No other leg lesions, no L sided lesions.  He never has blistering of the lesions.  They usually/slowly scab over.    Meds, vitals, and allergies reviewed.   ROS: See HPI.  Otherwise, noncontributory.  nad R lower leg with minimal edema 4 lesions on the R medial shin distally, of various stages of healing.  Each about 1 cm across or less.   No ulceration.  Some are scabbed over.   He has palpable DP pulse but is decreased (ie 1+) from the radial pulse baseline.   Varicose/spider veins in the feet but not in the calf.   No drainage or weeping.  No spreading erythema.

## 2015-03-30 ENCOUNTER — Ambulatory Visit (HOSPITAL_COMMUNITY)
Admission: RE | Admit: 2015-03-30 | Discharge: 2015-03-30 | Disposition: A | Discharge status: Home / Self Care | Payer: Medicare Other | Source: Ambulatory Visit | Attending: Cardiovascular Disease | Admitting: Cardiovascular Disease

## 2015-03-30 ENCOUNTER — Other Ambulatory Visit: Payer: Self-pay | Admitting: Family Medicine

## 2015-03-30 DIAGNOSIS — L97919 Non-pressure chronic ulcer of unspecified part of right lower leg with unspecified severity: Secondary | ICD-10-CM | POA: Diagnosis not present

## 2015-03-30 DIAGNOSIS — R938 Abnormal findings on diagnostic imaging of other specified body structures: Secondary | ICD-10-CM | POA: Diagnosis not present

## 2015-03-30 DIAGNOSIS — E785 Hyperlipidemia, unspecified: Secondary | ICD-10-CM | POA: Insufficient documentation

## 2015-03-30 DIAGNOSIS — I70203 Unspecified atherosclerosis of native arteries of extremities, bilateral legs: Secondary | ICD-10-CM | POA: Insufficient documentation

## 2015-04-01 ENCOUNTER — Other Ambulatory Visit: Payer: Self-pay | Admitting: Family Medicine

## 2015-04-01 DIAGNOSIS — I739 Peripheral vascular disease, unspecified: Secondary | ICD-10-CM

## 2015-04-02 ENCOUNTER — Encounter: Payer: Self-pay | Admitting: Family Medicine

## 2015-04-03 DIAGNOSIS — J449 Chronic obstructive pulmonary disease, unspecified: Secondary | ICD-10-CM | POA: Diagnosis not present

## 2015-04-14 ENCOUNTER — Encounter: Payer: Self-pay | Admitting: Vascular Surgery

## 2015-04-22 ENCOUNTER — Encounter: Payer: Self-pay | Admitting: Vascular Surgery

## 2015-04-22 ENCOUNTER — Ambulatory Visit (INDEPENDENT_AMBULATORY_CARE_PROVIDER_SITE_OTHER): Payer: Medicare Other | Admitting: Vascular Surgery

## 2015-04-22 VITALS — BP 114/65 | HR 68 | Temp 97.8°F | Resp 16 | Ht 70.0 in | Wt 165.0 lb

## 2015-04-22 DIAGNOSIS — L97911 Non-pressure chronic ulcer of unspecified part of right lower leg limited to breakdown of skin: Secondary | ICD-10-CM

## 2015-04-22 NOTE — Addendum Note (Signed)
Addended by: Mena Goes on: 04/22/2015 04:52 PM   Modules accepted: Orders

## 2015-04-22 NOTE — Progress Notes (Signed)
Referring Physician: Tonia Ghent, MD  Patient name: [name] MRN: [id number] DOB: 1929/01/01 Sex: male  REASON FOR CONSULT: Right leg ulcer  HPI: [name] is a 82 y.o. male,  referred for evaluation of her right lower extremity ulcer. This has been present for approximate 5 weeks. He has never had any prior episodes of ulcerations on the legs. He is able to walk. He is somewhat limited in his exercise capacity due to the fact that he is on chronic home oxygen. He does not describe claudication or rest pain. He is on Xarelto for atrial fibrillation. Other medical problems include hyperlipidemia, coronary artery disease and COPD all of which are currently stable.  Past Medical History  Diagnosis Date  . Hyperlipidemia   . Spontaneous pneumothorax 09/04/2010    [name]  . Erectile dysfunction   . AF (atrial fibrillation) ([place])     after PTX 2012  . Coronary artery disease   . HSV-1 (herpes simplex virus 1) infection   . Elevated MCV   . Allergy   . GERD (gastroesophageal reflux disease)   . BPH (benign prostatic hyperplasia)   . Back pain   . COPD (chronic obstructive pulmonary disease) ([place])   . Arthritis     h/o R shoulder injection per ortho    Family History  Problem Relation Age of Onset  . Diabetes Mother   . Prostate cancer Neg Hx   . Colon cancer Brother     possible dx, not certain  . Peripheral Artery Disease Father   . Hypertension    . Stroke Brother   . Heart attack Neg Hx     SOCIAL HISTORY: Social History   Social History  . Marital Status: Married    Spouse Name: N/A  . Number of Children: N/A  . Years of Education: N/A   Occupational History  . Not on file.   Social History Main Topics  . Smoking status: Former Smoker -- 1.00 packs/day for 50 years    Types: Cigarettes    Quit date: 04/14/1999  . Smokeless tobacco: Never Used  . Alcohol Use: No  . Drug Use: No  . Sexual Activity: Yes   Other Topics Concern    . Not on file   Social History Narrative   Occupation:Retired [employer]   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 Rosuvastatin     REACTION: myalgias at 60m/day dose    Current Outpatient Prescriptions  Medication Sig Dispense Refill  . acetaminophen (TYLENOL) 325 MG tablet Take 650 mg by mouth every 6 (six) hours as needed.    .Marland Kitchenalbuterol (PROAIR HFA) 108 (90 BASE) MCG/ACT inhaler Inhale 2 puffs into the lungs every 4 (four) hours as needed for wheezing or shortness of breath. 1 Inhaler 2  . Cyanocobalamin (VITAMIN B 12 PO) Take by mouth daily.    .Mariane BaumgartenCalcium (STOOL SOFTENER PO) Take by mouth daily.    . fluticasone (FLONASE) 50 MCG/ACT nasal spray Place 2 sprays into both nostrils daily.    . metoprolol tartrate (LOPRESSOR) 25 MG tablet Take 0.5 tablets (12.5 mg total) by mouth as needed (for palpitations).    . Multiple Vitamin (MULTIVITAMIN) tablet Take 1 tablet by mouth daily.    . NON FORMULARY Oxygen 2 liters as needed    . omeprazole (PRILOSEC) 20 MG capsule Take  20 mg by mouth daily.      . Rivaroxaban (XARELTO) 15 MG TABS tablet Take 15 mg by mouth every morning.    . tamsulosin (FLOMAX) 0.4 MG CAPS capsule Take 2 capsules (0.8 mg total) by mouth daily. 180 capsule 3  . traMADol (ULTRAM) 50 MG tablet TAKE 1 TABLET BY MOUTH EVERY 12 HOURS AS NEEDED FOR PAIN 30 tablet 2  . traZODone (DESYREL) 100 MG tablet Take 2 tablets (200 mg total) by mouth at bedtime. 180 tablet 1  . triamcinolone cream (KENALOG) 0.1 % APPLY TOPICALLY 2 (TWO) TIMES DAILY. (Patient taking differently: APPLY TOPICALLY 2 (TWO) TIMES DAILY. AS NEEDED) 30 g 0   No current facility-administered medications for this visit.    ROS:   General:  No weight loss, Fever, chills  HEENT: No recent headaches, no nasal bleeding, no visual changes, no sore throat  Neurologic: No dizziness, blackouts, seizures. No recent symptoms of  stroke or mini- stroke. No recent episodes of slurred speech, or temporary blindness.  Cardiac: No recent episodes of chest pain/pressure, no shortness of breath at rest.  + shortness of breath with exertion.  + history of atrial fibrillation or irregular heartbeat  Vascular: + history of rest pain in feet.  + history of claudication.  No history of non-healing ulcer, No history of DVT   Pulmonary: + home oxygen, no productive cough, no hemoptysis,  No asthma or wheezing  Musculoskeletal:  _0  Arthritis, _1  Low back pain,  _2  Joint pain  Hematologic:No history of hypercoagulable state.  No history of easy bleeding.  No history of anemia  Gastrointestinal: No hematochezia or melena,  No gastroesophageal reflux, no trouble swallowing  Urinary: _3  chronic Kidney disease, _4  on HD - _5  MWF or _6  TTHS, _7  Burning with urination, _8  Frequent urination, _9  Difficulty urinating;   Skin: No rashes  Psychological: No history of anxiety,  No history of depression   Physical Examination   Filed Vitals:   04/22/15 1435  BP: 114/65  Pulse: 68  Temp: 97.8 F (36.6 C)  TempSrc: Oral  Resp: 16  Height: _10  (1.778 m)  Weight: 165 lb (74.844 kg)  SpO2: 94%   General:  Alert and oriented, no acute distress HEENT: Normal Neck: No bruit or JVD Pulmonary: Clear to auscultation bilaterallyDistant breath sounds Cardiac: Regular Rate and Rhythm without murmur Abdomen: Soft, non-tender, non-distended, no mass Skin: No rash, two 2 cm ulcerations above the right lateral malleolus less than 1 mm depth some granulation tissue Extremity Pulses:  2+ radial, brachial, femoral, dorsalis pedis, absent posterior tibial pulses bilaterally Musculoskeletal: No deformity or edema  Neurologic: Upper and lower extremity motor 5/5 and symmetric  DATA:  Patient had recent bilateral ABIs which I reviewed. These were greater than 1 bilaterally and normal. On duplex ultrasound he had evidence of a less  than 50% superficial femoral artery stenosis and occlusive disease in his posterior tibial artery bilaterally but the anterior tibial and peroneal arteries were patent.  ASSESSMENT:  Very mild arterial occlusive disease with palpable pulses in both feet. Probably has some component of venous reflux causing the ulcers difficulty to heal.   PLAN:  Patient was given a prescription today or bilateral lower extremity compression stockings. He was also placed in an Unna boot on the right leg today with the plan to change this once weekly for 4 weeks and then reassess the ulcer. She will follow-up with me  in 1 month.   Ruta Hinds, MD [facility] Office: [phone] Pager: [phone]

## 2015-04-28 ENCOUNTER — Encounter: Payer: Self-pay | Admitting: *Deleted

## 2015-04-29 ENCOUNTER — Encounter: Payer: Self-pay | Admitting: *Deleted

## 2015-04-30 ENCOUNTER — Ambulatory Visit (INDEPENDENT_AMBULATORY_CARE_PROVIDER_SITE_OTHER): Payer: Medicare Other

## 2015-04-30 DIAGNOSIS — L97911 Non-pressure chronic ulcer of unspecified part of right lower leg limited to breakdown of skin: Secondary | ICD-10-CM | POA: Diagnosis not present

## 2015-04-30 DIAGNOSIS — L97909 Non-pressure chronic ulcer of unspecified part of unspecified lower leg with unspecified severity: Secondary | ICD-10-CM | POA: Insufficient documentation

## 2015-04-30 NOTE — Progress Notes (Signed)
Mr. [name] was seen today for a unna boot reapplication to his Rt lower leg. The old unna boot was removed, his leg was cleaned with wound cleaner and dried thoroughly. A new unna boot was applied. The patient will return for reapplication next week. Chanda Maness-Harrison CMA,AAMA

## 2015-05-04 DIAGNOSIS — J449 Chronic obstructive pulmonary disease, unspecified: Secondary | ICD-10-CM | POA: Diagnosis not present

## 2015-05-06 ENCOUNTER — Encounter: Payer: Self-pay | Admitting: *Deleted

## 2015-05-07 ENCOUNTER — Ambulatory Visit (INDEPENDENT_AMBULATORY_CARE_PROVIDER_SITE_OTHER): Payer: Medicare Other

## 2015-05-07 DIAGNOSIS — L97911 Non-pressure chronic ulcer of unspecified part of right lower leg limited to breakdown of skin: Secondary | ICD-10-CM | POA: Diagnosis not present

## 2015-05-07 NOTE — Progress Notes (Signed)
[name] was seen today for a unna boot reapplication to his Rt lower leg. He had removed the previous unna boot before coming in to shower. A new unna boot was applied. He will return next week for reapplication. Chanda Maness-Harrison CMA, AAMA

## 2015-05-14 ENCOUNTER — Ambulatory Visit (HOSPITAL_COMMUNITY)
Admission: RE | Admit: 2015-05-14 | Discharge: 2015-05-14 | Disposition: A | Discharge status: Home / Self Care | Payer: Medicare Other | Source: Ambulatory Visit | Attending: Vascular Surgery | Admitting: Vascular Surgery

## 2015-05-14 ENCOUNTER — Ambulatory Visit (INDEPENDENT_AMBULATORY_CARE_PROVIDER_SITE_OTHER): Payer: Medicare Other

## 2015-05-14 DIAGNOSIS — L97911 Non-pressure chronic ulcer of unspecified part of right lower leg limited to breakdown of skin: Secondary | ICD-10-CM | POA: Diagnosis not present

## 2015-05-14 DIAGNOSIS — K219 Gastro-esophageal reflux disease without esophagitis: Secondary | ICD-10-CM | POA: Insufficient documentation

## 2015-05-14 DIAGNOSIS — R609 Edema, unspecified: Secondary | ICD-10-CM | POA: Diagnosis present

## 2015-05-14 DIAGNOSIS — I8391 Asymptomatic varicose veins of right lower extremity: Secondary | ICD-10-CM | POA: Insufficient documentation

## 2015-05-14 DIAGNOSIS — E785 Hyperlipidemia, unspecified: Secondary | ICD-10-CM | POA: Diagnosis not present

## 2015-05-14 DIAGNOSIS — I82811 Embolism and thrombosis of superficial veins of right lower extremities: Secondary | ICD-10-CM | POA: Insufficient documentation

## 2015-05-14 DIAGNOSIS — I2510 Atherosclerotic heart disease of native coronary artery without angina pectoris: Secondary | ICD-10-CM | POA: Insufficient documentation

## 2015-05-14 NOTE — Progress Notes (Signed)
Mr. Koontz was seen today for an [place] boot reapplication to R LE. The previous unna boot had been removed prior to the appointment so that he could shower. A new unna boot was applied to his R LE.  He will follow up with Dr. Oneida Alar next week for an office visit. Chanda Maness-Harrison CMA, AAMA

## 2015-05-15 ENCOUNTER — Encounter: Payer: Self-pay | Admitting: Vascular Surgery

## 2015-05-15 ENCOUNTER — Ambulatory Visit (HOSPITAL_COMMUNITY): Payer: Medicare Other

## 2015-05-21 ENCOUNTER — Encounter: Payer: Self-pay | Admitting: Vascular Surgery

## 2015-05-21 ENCOUNTER — Ambulatory Visit (INDEPENDENT_AMBULATORY_CARE_PROVIDER_SITE_OTHER): Payer: Medicare Other | Admitting: Vascular Surgery

## 2015-05-21 VITALS — BP 129/81 | HR 66 | Ht 70.0 in | Wt 165.0 lb

## 2015-05-21 DIAGNOSIS — I83019 Varicose veins of right lower extremity with ulcer of unspecified site: Secondary | ICD-10-CM | POA: Diagnosis not present

## 2015-05-21 DIAGNOSIS — IMO0001 Reserved for inherently not codable concepts without codable children: Secondary | ICD-10-CM

## 2015-05-21 NOTE — Progress Notes (Signed)
[facility]  Patient name: [name] MRN: [id number] DOB: October 04, 1928 Sex: male  REASON FOR VISIT: Follow-up venous stasis ulcer  HPI: [name] is a 82 y.o. male who presents for 4 week follow-up of his right lower extremity ulcer. He has completed 4 weeks of Unna boot therapy. The patient has been wearing a compression stocking on his left leg. He does state that it is very difficult to get the compression stockings on. He has been elevating his legs. The patient has never had prior episodes of ulcerations.   He is on chronic home oxygen. His medical problems include hyperlipidemia, coronary artery disease disease, and COPD, all of which are stable.  Past Medical History  Diagnosis Date  . Hyperlipidemia   . Spontaneous pneumothorax 09/04/2010    [name]  . Erectile dysfunction   . AF (atrial fibrillation) ([place])     after PTX 2012  . Coronary artery disease   . HSV-1 (herpes simplex virus 1) infection   . Elevated MCV   . Allergy   . GERD (gastroesophageal reflux disease)   . BPH (benign prostatic hyperplasia)   . Back pain   . COPD (chronic obstructive pulmonary disease) ([place])   . Arthritis     h/o R shoulder injection per ortho    Family History  Problem Relation Age of Onset  . Diabetes Mother   . Prostate cancer Neg Hx   . Colon cancer Brother     possible dx, not certain  . Peripheral Artery Disease Father   . Hypertension    . Stroke Brother   . Heart attack Neg Hx     SOCIAL HISTORY: Social History  Substance Use Topics  . Smoking status: Former Smoker -- 1.00 packs/day for 50 years    Types: Cigarettes    Quit date: 04/14/1999  . Smokeless tobacco: Never Used  . Alcohol Use: No    Allergies  Allergen Reactions  . Rosuvastatin     REACTION: myalgias at 58m/day dose    Current Outpatient Prescriptions  Medication Sig Dispense Refill  . acetaminophen (TYLENOL) 325 MG tablet Take 650 mg by mouth  every 6 (six) hours as needed.    .Marland Kitchenalbuterol (PROAIR HFA) 108 (90 BASE) MCG/ACT inhaler Inhale 2 puffs into the lungs every 4 (four) hours as needed for wheezing or shortness of breath. 1 Inhaler 2  . Cyanocobalamin (VITAMIN B 12 PO) Take by mouth daily.    .Mariane BaumgartenCalcium (STOOL SOFTENER PO) Take by mouth daily.    . fluticasone (FLONASE) 50 MCG/ACT nasal spray Place 2 sprays into both nostrils daily.    . Multiple Vitamin (MULTIVITAMIN) tablet Take 1 tablet by mouth daily. Reported on 04/22/2015    . NON FORMULARY Oxygen 2 liters as needed    . omeprazole (PRILOSEC) 20 MG capsule Take 20 mg by mouth daily.      . Rivaroxaban (XARELTO) 15 MG TABS tablet Take 15 mg by mouth every morning.    . tamsulosin (FLOMAX) 0.4 MG CAPS capsule Take 2 capsules (0.8 mg total) by mouth daily. 180 capsule 3  . traMADol (ULTRAM) 50 MG tablet TAKE 1 TABLET BY MOUTH EVERY 12 HOURS AS NEEDED FOR PAIN 30 tablet 2  . traZODone (DESYREL) 100 MG tablet Take 2 tablets (200 mg total) by mouth at bedtime. 180 tablet 1  . triamcinolone cream (KENALOG) 0.1 % APPLY TOPICALLY 2 (TWO) TIMES DAILY. (Patient taking differently: APPLY TOPICALLY 2 (TWO)  TIMES DAILY. AS NEEDED) 30 g 0  . metoprolol tartrate (LOPRESSOR) 25 MG tablet Take 0.5 tablets (12.5 mg total) by mouth as needed (for palpitations). (Patient not taking: Reported on 04/22/2015)     No current facility-administered medications for this visit.    REVIEW OF SYSTEMS:  _0  denotes positive finding, _1  denotes negative finding Cardiac  Comments:  Chest pain or chest pressure:    Shortness of breath upon exertion:    Short of breath when lying flat:    Irregular heart rhythm:        Vascular    Pain in calf, thigh, or hip brought on by ambulation:    Pain in feet at night that wakes you up from your sleep:     Blood clot in your veins:    Leg swelling:  x       Pulmonary    Oxygen at home:    Productive cough:     Wheezing:         Neurologic      Sudden weakness in arms or legs:     Sudden numbness in arms or legs:     Sudden onset of difficulty speaking or slurred speech:    Temporary loss of vision in one eye:     Problems with dizziness:         Gastrointestinal    Blood in stool:     Vomited blood:         Genitourinary    Burning when urinating:     Blood in urine:        Psychiatric    Major depression:         Hematologic    Bleeding problems:    Problems with blood clotting too easily:        Skin    Rashes or ulcers: x       Constitutional    Fever or chills:      PHYSICAL EXAM: Filed Vitals:   05/21/15 1324  BP: 129/81  Pulse: 66  Height: _2  (1.778 m)  Weight: 165 lb (74.844 kg)  SpO2: 93%    GENERAL: Alert and oriented and in no acute distress. The vital signs are documented above. VASCULAR: Palpable right dorsalis pedis pulse. Previous ulcerations above right medial malleolus are essentially healed with scab formation.  PULMONARY: home oxygen nasal cannula MUSCULOSKELETAL: There are no major deformities or cyanosis. NEUROLOGIC: No focal deficits.  PSYCHIATRIC: The patient has a normal affect.   MEDICAL ISSUES: Chronic venous insufficiency  Venous stasis ulcers  The patient's right venous stasis ulcers have healed with 4 weeks of Unna boot treatment. Advised the patient to keep this wounds covered as the skin is still fragile. He may begin wearing compression stockings on his right leg. Advised the patient to continue wearing compression stockings bilaterally and to keep his legs elevated when not walking. Discussed that he is at risk for recurrent venous stasis ulceration if he does not keep his swelling under control. Provided information for a compression stocking donner as the patient has had issues getting his stockings on. The patient will follow up on an as-needed basis.  Virgina Jock, PA-C [facility]  History and exam details as above. Patient's  ulcer on his right leg has essentially completely healed at this point. He will continue to wear compression stockings as much as possible to prevent recurrence. He will follow-up with [place] on as-needed basis.  Ruta Hinds,  MD [facility] [place] Office: [phone] Pager: [phone]

## 2015-05-28 ENCOUNTER — Other Ambulatory Visit: Payer: Self-pay | Admitting: Family Medicine

## 2015-05-29 NOTE — Telephone Encounter (Signed)
Rx called into pharmacy

## 2015-05-29 NOTE — Telephone Encounter (Signed)
Approved: #30 x 0 Let patient know Dr D not here---so no refills put on

## 2015-05-29 NOTE — Telephone Encounter (Signed)
Dr Damita Dunnings pt---last filled 02/24/15 with 2 refills--please advise

## 2015-06-03 DIAGNOSIS — J449 Chronic obstructive pulmonary disease, unspecified: Secondary | ICD-10-CM | POA: Diagnosis not present

## 2015-06-04 ENCOUNTER — Telehealth: Payer: Self-pay | Admitting: Pulmonary Disease

## 2015-06-05 ENCOUNTER — Ambulatory Visit (INDEPENDENT_AMBULATORY_CARE_PROVIDER_SITE_OTHER): Payer: Medicare Other | Admitting: Primary Care

## 2015-06-05 ENCOUNTER — Ambulatory Visit (INDEPENDENT_AMBULATORY_CARE_PROVIDER_SITE_OTHER)
Admission: RE | Admit: 2015-06-05 | Discharge: 2015-06-05 | Disposition: A | Discharge status: Home / Self Care | Payer: Medicare Other | Source: Ambulatory Visit | Attending: Adult Health | Admitting: Adult Health

## 2015-06-05 ENCOUNTER — Encounter: Payer: Self-pay | Admitting: Primary Care

## 2015-06-05 ENCOUNTER — Ambulatory Visit (INDEPENDENT_AMBULATORY_CARE_PROVIDER_SITE_OTHER): Payer: Medicare Other | Admitting: Adult Health

## 2015-06-05 ENCOUNTER — Ambulatory Visit (INDEPENDENT_AMBULATORY_CARE_PROVIDER_SITE_OTHER)
Admission: RE | Admit: 2015-06-05 | Discharge: 2015-06-05 | Disposition: A | Discharge status: Home / Self Care | Payer: Medicare Other | Source: Ambulatory Visit | Attending: Primary Care | Admitting: Primary Care

## 2015-06-05 ENCOUNTER — Encounter: Payer: Self-pay | Admitting: Adult Health

## 2015-06-05 VITALS — BP 120/68 | HR 90 | Temp 97.8°F | Ht 70.0 in | Wt 167.1 lb

## 2015-06-05 VITALS — BP 150/80 | HR 88 | Ht 70.0 in | Wt 167.0 lb

## 2015-06-05 DIAGNOSIS — J439 Emphysema, unspecified: Secondary | ICD-10-CM | POA: Diagnosis not present

## 2015-06-05 DIAGNOSIS — J449 Chronic obstructive pulmonary disease, unspecified: Secondary | ICD-10-CM

## 2015-06-05 DIAGNOSIS — J9611 Chronic respiratory failure with hypoxia: Secondary | ICD-10-CM

## 2015-06-05 DIAGNOSIS — M25551 Pain in right hip: Secondary | ICD-10-CM | POA: Diagnosis not present

## 2015-06-05 DIAGNOSIS — M545 Low back pain, unspecified: Secondary | ICD-10-CM

## 2015-06-05 MED ORDER — TIOTROPIUM BROMIDE MONOHYDRATE 18 MCG IN CAPS: 18.0000 ug | ORAL_CAPSULE | Freq: Every day | RESPIRATORY_TRACT | Status: AC

## 2015-06-05 NOTE — Telephone Encounter (Signed)
There is no documentation on this encounter. Message will be closed.

## 2015-06-05 NOTE — Progress Notes (Signed)
Subjective:    Patient ID: [name], male    DOB: 1928/11/03, 82 y.o.   MRN: [id number]  HPI  Mr. [name] is an 82 year old male who presents today with a chief complaint of back pain. His back pain is located to his bilateral lower back since falling 1 week ago. He was walking downstairs in his basement, he was on the last step and missed it. He could not catch himself and landed on his right side including his hip. Denies hip pain today but did experience for several days after his fall. Denies headache, numbness/tingling, and dizziness. He's taken tylenol and tramadol with some improvement. His pain was worse this morning which improved after a hot shower and application of Aspercream. He describes his pain as stiff and achy. His pain is worse with walking.   Review of Systems  Musculoskeletal: Positive for back pain.  Skin: Negative for wound.  Neurological: Negative for dizziness and headaches.       Past Medical History  Diagnosis Date  . Hyperlipidemia   . Spontaneous pneumothorax 09/04/2010    [name]  . Erectile dysfunction   . AF (atrial fibrillation) ([place])     after PTX 2012  . Coronary artery disease   . HSV-1 (herpes simplex virus 1) infection   . Elevated MCV   . Allergy   . GERD (gastroesophageal reflux disease)   . BPH (benign prostatic hyperplasia)   . Back pain   . COPD (chronic obstructive pulmonary disease) ([place])   . Arthritis     h/o R shoulder injection per ortho     Social History   Social History  . Marital Status: Married    Spouse Name: N/A  . Number of Children: N/A  . Years of Education: N/A   Occupational History  . Not on file.   Social History Main Topics  . Smoking status: Former Smoker -- 1.00 packs/day for 50 years    Types: Cigarettes    Quit date: 04/14/1999  . Smokeless tobacco: Never Used  . Alcohol Use: No  . Drug Use: No  . Sexual Activity: Yes   Other Topics Concern  . Not on file   Social History  Narrative   Occupation:Retired [employer]   Married 1957   Lives with wife    [place] [employer]    Past Surgical History  Procedure Laterality Date  . Chest tube placement  09/04/2010  . Video assisted thoracoscopy  12/15/2010    Procedure: VIDEO ASSISTED THORACOSCOPY;  Surgeon: Melrose Nakayama, MD;  Location: [facility];  Service: Thoracic;  Laterality: Left;  blebectomy with pleural abrasion  . Video assisted thoracoscopy Right 06/28/2013    Procedure: VIDEO ASSISTED THORACOSCOPY with stapling of blebs;  Surgeon: Melrose Nakayama, MD;  Location: [facility];  Service: Thoracic;  Laterality: Right;  (R) VATS W/BLEBECTOMY  . Cataract extraction, bilateral      Family History  Problem Relation Age of Onset  . Diabetes Mother   . Prostate cancer Neg Hx   . Colon cancer Brother     possible dx, not certain  . Peripheral Artery Disease Father   . Hypertension    . Stroke Brother   . Heart attack Neg Hx     Allergies  Allergen Reactions  . Rosuvastatin     REACTION: myalgias at 70m/day dose    Current Outpatient Prescriptions on File Prior to Visit  Medication Sig Dispense  Refill  . acetaminophen (TYLENOL) 325 MG tablet Take 650 mg by mouth every 6 (six) hours as needed.    Marland Kitchen albuterol (PROAIR HFA) 108 (90 BASE) MCG/ACT inhaler Inhale 2 puffs into the lungs every 4 (four) hours as needed for wheezing or shortness of breath. 1 Inhaler 2  . Cyanocobalamin (VITAMIN B 12 PO) Take by mouth daily.    Mariane Baumgarten Calcium (STOOL SOFTENER PO) Take by mouth daily.    . fluticasone (FLONASE) 50 MCG/ACT nasal spray Place 2 sprays into both nostrils daily.    . Multiple Vitamin (MULTIVITAMIN) tablet Take 1 tablet by mouth daily. Reported on 04/22/2015    . NON FORMULARY Oxygen 2 liters as needed    . omeprazole (PRILOSEC) 20 MG capsule Take 20 mg by mouth daily.      . Rivaroxaban (XARELTO) 15 MG TABS tablet Take 15 mg by mouth every morning.    . tamsulosin (FLOMAX) 0.4 MG  CAPS capsule Take 2 capsules (0.8 mg total) by mouth daily. 180 capsule 3  . traMADol (ULTRAM) 50 MG tablet TAKE 1 TABLET BY MOUTH EVERY 12 HOURS AS NEEDED FOR PAIN 30 tablet 0  . traZODone (DESYREL) 100 MG tablet Take 2 tablets (200 mg total) by mouth at bedtime. 180 tablet 1  . triamcinolone cream (KENALOG) 0.1 % APPLY TOPICALLY 2 (TWO) TIMES DAILY. (Patient taking differently: APPLY TOPICALLY 2 (TWO) TIMES DAILY. AS NEEDED) 30 g 0  . metoprolol tartrate (LOPRESSOR) 25 MG tablet Take 0.5 tablets (12.5 mg total) by mouth as needed (for palpitations). (Patient not taking: Reported on 04/22/2015)     No current facility-administered medications on file prior to visit.    BP 120/68 mmHg  Pulse 90  Temp(Src) 97.8 F (36.6 C) (Oral)  Ht _0  (1.778 m)  Wt 167 lb 1.9 oz (75.805 kg)  BMI 23.98 kg/m2  SpO2 91%    Objective:   Physical Exam  Constitutional: He is oriented to person, place, and time. He appears well-nourished.  Cardiovascular: Normal rate and regular rhythm.   Pulmonary/Chest: He has no wheezes. He has no rales.  Musculoskeletal:  Ambulatory in office with cane. Decrease in ROM upon flexion to lower back. No spinal tenderness or step-off.  Neurological: He is alert and oriented to person, place, and time.  Skin: Skin is warm and dry.          Assessment & Plan:  Back Pain:  Located to bilateral lower back x 1 week since fall. He denies headaches, dizziness, hip pain today. He does have a history of osteoarthritis to his spine, this could be an aggravation. Due to recent trauma and age, will obtain xrays of lower back and right hip. Negative straight leg raise. Skin intact. Minor abrasion to left patella. Alert and oriented x 3, ambulatory in clinic with cane. Xray's pending. Will have him take an extra tramadol in the morning as needed for pain with drowsiness precautions.

## 2015-06-05 NOTE — Progress Notes (Signed)
Pre visit review using our clinic review tool, if applicable. No additional management support is needed unless otherwise documented below in the visit note.

## 2015-06-05 NOTE — Assessment & Plan Note (Signed)
Would restart Spiriva .  Check cxr today   Plan  Restart Spiriva .  Use Oxygen 2l/m on continuous flow only with activity.  Chest xray today.  Please contact office for sooner follow up if symptoms do not improve or worsen or seek emergency care  Follow up Dr. Lake Bells in 3 months and As needed

## 2015-06-05 NOTE — Patient Instructions (Addendum)
Restart Spiriva .  Use Oxygen 2l/m on continuous flow only with activity.  Chest xray today.  Please contact office for sooner follow up if symptoms do not improve or worsen or seek emergency care  Follow up Dr. Lake Bells in 3 months and As needed

## 2015-06-05 NOTE — Assessment & Plan Note (Signed)
Would use continuous flow only with walking  Plan  Use Oxygen 2l/m on continuous flow only with activity.  Chest xray today.  Please contact office for sooner follow up if symptoms do not improve or worsen or seek emergency care  Follow up Dr. Lake Bells in 3 months and As needed

## 2015-06-05 NOTE — Progress Notes (Signed)
Subjective:    Patient ID: [name], male    DOB: [birth year], 81 y.o.   MRN: [id number]  HPI Synopsis: former smoker with moderate airflow obstruction due to COPD.  Spirometry in 12/2011 showed clear airflow obstruction and an FEV1 of 2.12 L (67% pre)  06/05/2015 Follow up : COPD  Pt presents for a follow up for COPD.  Says he feels his breathing is not as good over last year.  He did stop Spriva several months ago. Could not tell it helped but breathing is worse since stopping.  Gets winded easily . Has some dry cough and drainage on/off. Denies any f/c/s, n/v/d, hemoptysis.  No discolored mucus.  Is on Oyxgen ,  He has gotten small O2 tanks and uses pulsing O2 . His o2 drops on pulsing  On O2 2l/m continous flow , O2 sats >90%. We discussed avoiding pulse O2 setting.  PVX and Prevnar utd.    Past Medical History  Diagnosis Date  . Hyperlipidemia   . Spontaneous pneumothorax 09/04/2010    [name]  . Erectile dysfunction   . AF (atrial fibrillation) ([place])     after PTX 2012  . Coronary artery disease   . HSV-1 (herpes simplex virus 1) infection   . Elevated MCV   . Allergy   . GERD (gastroesophageal reflux disease)   . BPH (benign prostatic hyperplasia)   . Back pain   . COPD (chronic obstructive pulmonary disease) ([place])   . Arthritis     h/o R shoulder injection per ortho   Current Outpatient Prescriptions on File Prior to Visit  Medication Sig Dispense Refill  . acetaminophen (TYLENOL) 325 MG tablet Take 650 mg by mouth every 6 (six) hours as needed.    Marland Kitchen albuterol (PROAIR HFA) 108 (90 BASE) MCG/ACT inhaler Inhale 2 puffs into the lungs every 4 (four) hours as needed for wheezing or shortness of breath. 1 Inhaler 2  . Cyanocobalamin (VITAMIN B 12 PO) Take by mouth daily.    Mariane Baumgarten Calcium (STOOL SOFTENER PO) Take by mouth daily.    . fluticasone (FLONASE) 50 MCG/ACT nasal spray Place 2 sprays into both nostrils daily.    . metoprolol tartrate  (LOPRESSOR) 25 MG tablet Take 0.5 tablets (12.5 mg total) by mouth as needed (for palpitations).    . Multiple Vitamin (MULTIVITAMIN) tablet Take 1 tablet by mouth daily. Reported on 04/22/2015    . NON FORMULARY Oxygen 2 liters as needed and 3-4L with exertion    . omeprazole (PRILOSEC) 20 MG capsule Take 20 mg by mouth daily.      . Rivaroxaban (XARELTO) 15 MG TABS tablet Take 15 mg by mouth every morning.    . tamsulosin (FLOMAX) 0.4 MG CAPS capsule Take 2 capsules (0.8 mg total) by mouth daily. 180 capsule 3  . traMADol (ULTRAM) 50 MG tablet TAKE 1 TABLET BY MOUTH EVERY 12 HOURS AS NEEDED FOR PAIN 30 tablet 0  . traZODone (DESYREL) 100 MG tablet Take 2 tablets (200 mg total) by mouth at bedtime. 180 tablet 1  . triamcinolone cream (KENALOG) 0.1 % APPLY TOPICALLY 2 (TWO) TIMES DAILY. (Patient taking differently: APPLY TOPICALLY 2 (TWO) TIMES DAILY. AS NEEDED) 30 g 0   No current facility-administered medications on file prior to visit.      Review of Systems Constitutional:   No  weight loss, night sweats,  Fevers, chills,  +fatigue, or  lassitude.  HEENT:   No headaches,  Difficulty swallowing,  Tooth/dental  problems, or  Sore throat,                No sneezing, itching, ear ache, nasal congestion, post nasal drip,   CV:  No chest pain,  Orthopnea, PND, swelling in lower extremities, anasarca, dizziness, palpitations, syncope.   GI  No heartburn, indigestion, abdominal pain, nausea, vomiting, diarrhea, change in bowel habits, loss of appetite, bloody stools.   Resp:   No chest wall deformity  Skin: no rash or lesions.  GU: no dysuria, change in color of urine, no urgency or frequency.  No flank pain, no hematuria   MS:  No joint pain or swelling.  No decreased range of motion.  No back pain.  Psych:  No change in mood or affect. No depression or anxiety.  No memory loss.         Objective:   Physical Exam   Filed Vitals:   06/05/15 1607  BP: 150/80  Pulse: 88  Height:  5' 10" (1.778 m)  Weight: 167 lb (75.751 kg)  SpO2: 98%     GEN: A/Ox3; pleasant , NAD elderly and chronically ill appearing   HEENT:  [place]/AT,  EACs-clear, TMs-wnl, NOSE-clear, THROAT-clear, no lesions, no postnasal drip or exudate noted.   NECK:  Supple w/ fair ROM; no JVD; normal carotid impulses w/o bruits; no thyromegaly or nodules palpated; no lymphadenopathy.  RESP  Decreased BS in bases , no wheezing , no accessory muscle use, no dullness to percussion  CARD:  RRR, no m/r/g  , no peripheral edema, pulses intact, no cyanosis or clubbing.  GI:   Soft & nt; nml bowel sounds; no organomegaly or masses detected.  Musco: Warm bil, no deformities or joint swelling noted.   Neuro: alert, no focal deficits noted.    Skin: Warm, no lesions or rashes  Tammy Parrett NP-C  [facility]  06/05/2015        Assessment & Plan:

## 2015-06-05 NOTE — Patient Instructions (Signed)
Complete xray(s) prior to leaving today. I will notify you of your results once received.  You may take 1 of your Tramadol tablets in the morning for pain in addition to the tablet you take at bedtime for sleep. Caution as this may cause drowsiness.   Apply heat to your lower back three times daily for 20 minute intervals. Ensure you rest, but do not remain to sedentary as this could cause stiffness and increased pain.   I will be in touch once I receive these results. You may be sore up to 4 weeks after your fall.  It was a pleasure meeting you!  Back Pain, Adult Back pain is very common in adults.The cause of back pain is rarely dangerous and the pain often gets better over time.The cause of your back pain may not be known. Some common causes of back pain include:  Strain of the muscles or ligaments supporting the spine.  Wear and tear (degeneration) of the spinal disks.  Arthritis.  Direct injury to the back. For many people, back pain may return. Since back pain is rarely dangerous, most people can learn to manage this condition on their own. HOME CARE INSTRUCTIONS Watch your back pain for any changes. The following actions may help to lessen any discomfort you are feeling:  Remain active. It is stressful on your back to sit or stand in one place for long periods of time. Do not sit, drive, or stand in one place for more than 30 minutes at a time. Take short walks on even surfaces as soon as you are able.Try to increase the length of time you walk each day.  Exercise regularly as directed by your health care provider. Exercise helps your back heal faster. It also helps avoid future injury by keeping your muscles strong and flexible.  Do not stay in bed.Resting more than 1-2 days can delay your recovery.  Pay attention to your body when you bend and lift. The most comfortable positions are those that put less stress on your recovering back. Always use proper lifting techniques,  including:  Bending your knees.  Keeping the load close to your body.  Avoiding twisting.  Find a comfortable position to sleep. Use a firm mattress and lie on your side with your knees slightly bent. If you lie on your back, put a pillow under your knees.  Avoid feeling anxious or stressed.Stress increases muscle tension and can worsen back pain.It is important to recognize when you are anxious or stressed and learn ways to manage it, such as with exercise.  Take medicines only as directed by your health care provider. Over-the-counter medicines to reduce pain and inflammation are often the most helpful.Your health care provider may prescribe muscle relaxant drugs.These medicines help dull your pain so you can more quickly return to your normal activities and healthy exercise.  Apply ice to the injured area:  Put ice in a plastic bag.  Place a towel between your skin and the bag.  Leave the ice on for 20 minutes, 2-3 times a day for the first 2-3 days. After that, ice and heat may be alternated to reduce pain and spasms.  Maintain a healthy weight. Excess weight puts extra stress on your back and makes it difficult to maintain good posture. SEEK MEDICAL CARE IF:  You have pain that is not relieved with rest or medicine.  You have increasing pain going down into the legs or buttocks.  You have pain that does not improve  in one week.  You have night pain.  You lose weight.  You have a fever or chills. SEEK IMMEDIATE MEDICAL CARE IF:   You develop new bowel or bladder control problems.  You have unusual weakness or numbness in your arms or legs.  You develop nausea or vomiting.  You develop abdominal pain.  You feel faint.   This information is not intended to replace advice given to you by your health care provider. Make sure you discuss any questions you have with your health care provider.   Document Released: 01/03/2005 Document Revised: 01/24/2014 Document  Reviewed: 05/07/2013 Elsevier Interactive Patient Education [employer].

## 2015-06-08 ENCOUNTER — Telehealth: Payer: Self-pay | Admitting: Adult Health

## 2015-06-08 DIAGNOSIS — J9611 Chronic respiratory failure with hypoxia: Secondary | ICD-10-CM

## 2015-06-08 NOTE — Telephone Encounter (Signed)
LMTCB

## 2015-06-09 ENCOUNTER — Encounter: Payer: Self-pay | Admitting: Podiatry

## 2015-06-09 ENCOUNTER — Ambulatory Visit (INDEPENDENT_AMBULATORY_CARE_PROVIDER_SITE_OTHER): Payer: Medicare Other | Admitting: Podiatry

## 2015-06-09 DIAGNOSIS — B351 Tinea unguium: Secondary | ICD-10-CM | POA: Diagnosis not present

## 2015-06-09 DIAGNOSIS — M79676 Pain in unspecified toe(s): Secondary | ICD-10-CM

## 2015-06-09 DIAGNOSIS — M2040 Other hammer toe(s) (acquired), unspecified foot: Secondary | ICD-10-CM

## 2015-06-09 NOTE — Telephone Encounter (Signed)
Pt wife returning call.Hillery Hunter'

## 2015-06-09 NOTE — Telephone Encounter (Signed)
Yes that is fine  Thanks   

## 2015-06-09 NOTE — Telephone Encounter (Signed)
lmtcb x2 for pt's wife. 

## 2015-06-09 NOTE — Progress Notes (Signed)
Reviewed, I agree with this plan of care

## 2015-06-09 NOTE — Telephone Encounter (Signed)
Order entered for oxygen tanks. Patient's wife notified. Nothing further needed.

## 2015-06-09 NOTE — Progress Notes (Signed)
Patient ID: [name] [facility], male   DOB: Aug 16, 1928, 82 y.o.   MRN: [id number] Complaint:  Visit Type: Patient returns to my office for continued preventative foot care services. Complaint: Patient states" my nails have grown long and thick and become painful to walk and wear shoes" Patient has been diagnosed with glucose intolerance with neuropathy.. The patient presents for preventative foot care services. No changes to ROS  Podiatric Exam: Vascular: dorsalis pedis and posterior tibial pulses are palpable bilateral. Capillary return is immediate. Temperature gradient is WNL. Skin turgor WNL  Sensorium: Normal Semmes Weinstein monofilament test. Normal tactile sensation bilaterally. Nail Exam: Pt has thick disfigured discolored nails with subungual debris noted bilateral entire nail hallux through fifth toenails Ulcer Exam: There is no evidence of ulcer or pre-ulcerative changes or infection. Orthopedic Exam: Muscle tone and strength are WNL. No limitations in general ROM. No crepitus or effusions noted. HAV  B/L with overlapping second B/L Skin: No Porokeratosis. No infection or ulcers.    Diagnosis:  Onychomycosis, , Pain in right toe, pain in left toes  Treatment & Plan Procedures and Treatment: Consent by patient was obtained for treatment procedures. The patient understood the discussion of treatment and procedures well. All questions were answered thoroughly reviewed. Debridement of mycotic and hypertrophic toenails, 1 through 5 bilateral and clearing of subungual debris. No ulceration, no infection noted.  Return Visit-Office Procedure: Patient instructed to return to the office for a follow up visit 3 months for continued evaluation and treatment.    Gardiner Barefoot DPM

## 2015-06-09 NOTE — Telephone Encounter (Signed)
Spoke with pt's wife and she is requesting to have an order for 2 smaller portable tanks. She does not want a POC. She states that at [place] said she would be able to send this order if they wanted it.   TP please advise if ok to place order for portable tanks. Thanks!

## 2015-06-18 ENCOUNTER — Other Ambulatory Visit: Payer: Self-pay | Admitting: Internal Medicine

## 2015-06-19 NOTE — Telephone Encounter (Signed)
Please call in.  Thanks.   

## 2015-06-19 NOTE — Telephone Encounter (Signed)
Medication phoned to pharmacy.

## 2015-07-01 DIAGNOSIS — H353134 Nonexudative age-related macular degeneration, bilateral, advanced atrophic with subfoveal involvement: Secondary | ICD-10-CM | POA: Diagnosis not present

## 2015-07-01 DIAGNOSIS — Z961 Presence of intraocular lens: Secondary | ICD-10-CM | POA: Diagnosis not present

## 2015-07-04 DIAGNOSIS — J449 Chronic obstructive pulmonary disease, unspecified: Secondary | ICD-10-CM | POA: Diagnosis not present

## 2015-07-15 ENCOUNTER — Ambulatory Visit (INDEPENDENT_AMBULATORY_CARE_PROVIDER_SITE_OTHER): Payer: Medicare Other | Admitting: Family Medicine

## 2015-07-15 ENCOUNTER — Encounter: Payer: Self-pay | Admitting: Family Medicine

## 2015-07-15 VITALS — BP 110/70 | HR 62 | Temp 97.6°F | Ht 70.0 in | Wt 167.8 lb

## 2015-07-15 DIAGNOSIS — R7989 Other specified abnormal findings of blood chemistry: Secondary | ICD-10-CM | POA: Diagnosis not present

## 2015-07-15 DIAGNOSIS — H6123 Impacted cerumen, bilateral: Secondary | ICD-10-CM

## 2015-07-15 LAB — CBC WITH DIFFERENTIAL/PLATELET
BASOS PCT: 0.3 % (ref 0.0–3.0)
Basophils Absolute: 0 10*3/uL (ref 0.0–0.1)
EOS PCT: 0.7 % (ref 0.0–5.0)
Eosinophils Absolute: 0.1 10*3/uL (ref 0.0–0.7)
HEMATOCRIT: 42.7 % (ref 39.0–52.0)
Hemoglobin: 14.2 g/dL (ref 13.0–17.0)
LYMPHS ABS: 1.5 10*3/uL (ref 0.7–4.0)
LYMPHS PCT: 14.1 % (ref 12.0–46.0)
MCHC: 33.3 g/dL (ref 30.0–36.0)
MCV: 104.5 fl — AB (ref 78.0–100.0)
MONOS PCT: 6.9 % (ref 3.0–12.0)
Monocytes Absolute: 0.8 10*3/uL (ref 0.1–1.0)
NEUTROS ABS: 8.5 10*3/uL — AB (ref 1.4–7.7)
NEUTROS PCT: 78 % — AB (ref 43.0–77.0)
PLATELETS: 294 10*3/uL (ref 150.0–400.0)
RBC: 4.08 Mil/uL — ABNORMAL LOW (ref 4.22–5.81)
RDW: 14.9 % (ref 11.5–15.5)
WBC: 10.9 10*3/uL — ABNORMAL HIGH (ref 4.0–10.5)

## 2015-07-15 NOTE — Patient Instructions (Signed)
Use debrox in the meantime and we'll update you about your labs.  Take care.  Glad to see you.

## 2015-07-15 NOTE — Progress Notes (Signed)
Pre visit review using our clinic review tool, if applicable. No additional management support is needed unless otherwise documented below in the visit note.  His leg lesion is healed from prev. Still in compression stockings (minimal left over post inflammatory hyperpigmentation but not ulceration on exam today).  F/u for mildly elevated WBC, seen at [place].  No FCNAVD.    He can tolerate LUTS as is, with still having some sx.  D/w pt.    Wanted his ears checked. Dec hearing in L ear.  He had been trying to irrigate in the showers.   Meds, vitals, and allergies reviewed.   ROS: Per HPI unless specifically indicated in ROS section   nad B cerumen impaction, L>R ear, improved with irritation on the L ear, could hear better after irrigation, TM not red, no pus in the canal Still in compression stockings (minimal leftover post inflammatory hyperpigmentation but no ulceration on exam today). On O2 at baseline.  rrr Ctab

## 2015-07-16 DIAGNOSIS — H6120 Impacted cerumen, unspecified ear: Secondary | ICD-10-CM | POA: Insufficient documentation

## 2015-07-16 DIAGNOSIS — R7989 Other specified abnormal findings of blood chemistry: Secondary | ICD-10-CM | POA: Insufficient documentation

## 2015-07-16 NOTE — Assessment & Plan Note (Signed)
F/u for mildly elevated WBC, seen at [place].  No FCNAVD.  Recheck cbc today.  See notes on labs.

## 2015-07-16 NOTE — Assessment & Plan Note (Signed)
Some better with irritation, can try prn debrox at home and f/u prn.  He agrees.  He has tight ear canals at baseline and that may contribute, d/w pt.

## 2015-07-22 ENCOUNTER — Other Ambulatory Visit: Payer: Self-pay | Admitting: *Deleted

## 2015-07-22 MED ORDER — TRAMADOL HCL 50 MG PO TABS: ORAL_TABLET | ORAL | Status: DC

## 2015-07-22 NOTE — Telephone Encounter (Signed)
Please call in.  Thanks.   

## 2015-07-22 NOTE — Telephone Encounter (Signed)
Received faxed refill from pharmacy Last refill 06/19/15 #30 Last office visit 07/15/15

## 2015-07-23 NOTE — Telephone Encounter (Signed)
Medication phoned to pharmacy.  

## 2015-07-25 ENCOUNTER — Other Ambulatory Visit: Payer: Self-pay | Admitting: Family Medicine

## 2015-07-27 NOTE — Telephone Encounter (Signed)
Electronic refill request. Last Filled:    180 tablet 1 01/04/2015  Last office visit:   07/15/15  Please advise.

## 2015-07-28 NOTE — Telephone Encounter (Signed)
I sent the Rx to the pharmacy.

## 2015-08-03 DIAGNOSIS — J449 Chronic obstructive pulmonary disease, unspecified: Secondary | ICD-10-CM | POA: Diagnosis not present

## 2015-08-04 ENCOUNTER — Encounter: Payer: Self-pay | Admitting: Family Medicine

## 2015-08-04 LAB — WBC
CO2: 29 mmol/L
Creatinine, Ser: 1.3
HEMOGLOBIN: 14.6
Neutrophil %: 74.2
Platelets: 241
Potassium: 4.1 mmol/L
WBC: 11.93

## 2015-09-03 DIAGNOSIS — J449 Chronic obstructive pulmonary disease, unspecified: Secondary | ICD-10-CM | POA: Diagnosis not present

## 2015-09-08 ENCOUNTER — Ambulatory Visit (INDEPENDENT_AMBULATORY_CARE_PROVIDER_SITE_OTHER): Payer: Medicare Other | Admitting: Podiatry

## 2015-09-08 ENCOUNTER — Encounter: Payer: Self-pay | Admitting: Podiatry

## 2015-09-08 DIAGNOSIS — B351 Tinea unguium: Secondary | ICD-10-CM

## 2015-09-08 DIAGNOSIS — M79676 Pain in unspecified toe(s): Secondary | ICD-10-CM

## 2015-09-08 NOTE — Progress Notes (Signed)
Patient ID: [name] [facility], male   DOB: 1928/02/23, 82 y.o.   MRN: [id number] Complaint:  Visit Type: Patient returns to my office for continued preventative foot care services. Complaint: Patient states" my nails have grown long and thick and become painful to walk and wear shoes" Patient has been diagnosed with glucose intolerance with neuropathy.. The patient presents for preventative foot care services. No changes to ROS  Podiatric Exam: Vascular: dorsalis pedis and posterior tibial pulses are palpable bilateral. Capillary return is immediate. Temperature gradient is WNL. Skin turgor WNL  Sensorium: Normal Semmes Weinstein monofilament test. Normal tactile sensation bilaterally. Nail Exam: Pt has thick disfigured discolored nails with subungual debris noted bilateral entire nail hallux through fifth toenails Ulcer Exam: There is no evidence of ulcer or pre-ulcerative changes or infection. Orthopedic Exam: Muscle tone and strength are WNL. No limitations in general ROM. No crepitus or effusions noted. HAV  B/L with overlapping second B/L Skin: No Porokeratosis. No infection or ulcers.    Diagnosis:  Onychomycosis, , Pain in right toe, pain in left toes  Treatment & Plan Procedures and Treatment: Consent by patient was obtained for treatment procedures. The patient understood the discussion of treatment and procedures well. All questions were answered thoroughly reviewed. Debridement of mycotic and hypertrophic toenails, 1 through 5 bilateral and clearing of subungual debris. No ulceration, no infection noted.  Self inflicted wound 1,2 right foot.  Neosporin/DSD right foot Return Visit-Office Procedure: Patient instructed to return to the office for a follow up visit 10 weeks  for continued evaluation and treatment.    Gardiner Barefoot DPM

## 2015-09-10 ENCOUNTER — Ambulatory Visit (INDEPENDENT_AMBULATORY_CARE_PROVIDER_SITE_OTHER): Payer: Medicare Other | Admitting: Pulmonary Disease

## 2015-09-10 ENCOUNTER — Encounter: Payer: Self-pay | Admitting: Pulmonary Disease

## 2015-09-10 ENCOUNTER — Other Ambulatory Visit (INDEPENDENT_AMBULATORY_CARE_PROVIDER_SITE_OTHER): Payer: Medicare Other

## 2015-09-10 VITALS — BP 124/70 | HR 94 | Ht 70.0 in | Wt 169.2 lb

## 2015-09-10 DIAGNOSIS — J9611 Chronic respiratory failure with hypoxia: Secondary | ICD-10-CM | POA: Diagnosis not present

## 2015-09-10 LAB — BRAIN NATRIURETIC PEPTIDE: Pro B Natriuretic peptide (BNP): 105 pg/mL — ABNORMAL HIGH (ref 0.0–100.0)

## 2015-09-10 NOTE — Patient Instructions (Signed)
Keep using your oxygen as you are doing, up to 5 L with exertion Keep using Spiriva daily We will call you with the results of the bloodwork, the lung function tests, and the CT scan I will see you back in 2-3 weeks or sooner if needed

## 2015-09-10 NOTE — Assessment & Plan Note (Signed)
I'm concerned about the worsening respiratory failure in the last several months. He's had a dramatic increase in his oxygen requirement. Based on his physical exam with crackles in the bases of his lungs and primarily concerned about the possibility of a concomitant interstitial lung disease such as idiopathic pulmonary fibrosis.  Plan: Pulmonary function testing High-resolution CT scan of the chest Use 2 L of oxygen at rest, 5 with exertion, will change home prescription to accommodate this Follow-up 2-3 weeks after these tests.

## 2015-09-10 NOTE — Progress Notes (Signed)
Subjective:    Patient ID: [name], male    DOB: [birth year], 82 y.o.   MRN: [id number]  Synopsis: former smoker with moderate airflow obstruction due to COPD.  Spirometry in 12/2011 showed clear airflow obstruction and an FEV1 of 2.12 L (67% pred).  HPI Chief Complaint  Patient presents with  . Follow-up    pt c/o worsening sob with exertion, some PND worse on R side.      [name] says that he has been "OK".  In the last 7-8 months he feels he has "gone downhill".  He says that he has been having to use escalating doses of oxygen lately. Specifically he has been needing to increase his oxygen to 4-5 liters while walking to maintain his O2 > 88%, sometimes even with this he will drop to 83%.  He feels that his exercise capacity has not worsened.  He does feel he gets more short of breath with walking shorter distances.  He has gained weight and he has a better appetite.  Doesn't have much cough.  He has a little ankle swelling and wears compression stockings.  He was not hospitalized.  He has noted some orthopnea, no chest pain.  He has been taking flomax lately.    Past Medical History:  Diagnosis Date  . AF (atrial fibrillation) ([place])    after PTX 2012  . Allergy   . Arthritis    h/o R shoulder injection per ortho  . Back pain   . BPH (benign prostatic hyperplasia)   . COPD (chronic obstructive pulmonary disease) ([place])   . Coronary artery disease   . Elevated MCV   . Erectile dysfunction   . GERD (gastroesophageal reflux disease)   . HSV-1 (herpes simplex virus 1) infection   . Hyperlipidemia   . Spontaneous pneumothorax 09/04/2010   [name]     Review of Systems  Constitutional: Positive for fatigue. Negative for chills and fever.  HENT: Negative for postnasal drip, rhinorrhea and sinus pressure.   Respiratory: Positive for shortness of breath. Negative for cough and wheezing.   Cardiovascular: Negative for chest pain, palpitations and leg swelling.        Objective:   Physical Exam Vitals:   09/10/15 1339  BP: 124/70  BP Location: Left Arm  Cuff Size: Normal  Pulse: 94  SpO2: 90%  Weight: 169 lb 3.2 oz (76.7 kg)  Height: _0  (1.778 m)   2L August, 5L with exertion  Gen: chronically ill, appearing HEENT: NCAT, EOMi PULM: Crackles bases B CV: Irreg irreg, no mgr AB: BS+, soft, nontender Ext: warm, no edema  Chest x-ray from 2017 reviewed showing significant emphysema with some question of interstitial changes in the bases bilaterally  Notes from a nurse practitioner in May 2017 reviewed were he was cared for his chronic respiratory therapy hypoxemia and COPD with Spiriva and supplemental oxygen.    Assessment & Plan:   COPD The airflow obstruction in 2014 was only moderate, and that he's had worsening shortness of breath and chronic respiratory therapy hypoxemia lately, he has not necessarily had worsening COPD symptoms. This concerns me that there is no alternative diagnosis to explain his worsening respiratory failure.  Plan: Continue Spiriva See below  Chronic hypoxemic respiratory failure (HCC) I'm concerned about the worsening respiratory failure in the last several months. He's had a dramatic increase in his oxygen requirement. Based on his physical exam with crackles in the bases of his lungs and primarily concerned about the  possibility of a concomitant interstitial lung disease such as idiopathic pulmonary fibrosis.  Plan: Pulmonary function testing High-resolution CT scan of the chest Use 2 L of oxygen at rest, 5 with exertion, will change home prescription to accommodate this Follow-up 2-3 weeks after these tests.   Updated Medication List Outpatient Encounter Prescriptions as of 09/10/2015  Medication Sig  . acetaminophen (TYLENOL) 325 MG tablet Take 650 mg by mouth every 6 (six) hours as needed.  Marland Kitchen albuterol (PROAIR HFA) 108 (90 BASE) MCG/ACT inhaler Inhale 2 puffs into the lungs every 4 (four) hours  as needed for wheezing or shortness of breath.  . Cyanocobalamin (VITAMIN B 12 PO) Take by mouth daily.  [name] Calcium (STOOL SOFTENER PO) Take by mouth daily.  . fluticasone (FLONASE) 50 MCG/ACT nasal spray Place 2 sprays into both nostrils daily.  . metoprolol tartrate (LOPRESSOR) 25 MG tablet Take 0.5 tablets (12.5 mg total) by mouth as needed (for palpitations).  . Multiple Vitamin (MULTIVITAMIN) tablet Take 1 tablet by mouth daily. Reported on 04/22/2015  . Multiple Vitamins-Minerals (ICAPS AREDS 2 PO) Take by mouth 2 (two) times daily.  . NON FORMULARY Oxygen 2-5 liters 24/7  . omeprazole (PRILOSEC) 20 MG capsule Take 20 mg by mouth daily.    . Rivaroxaban (XARELTO) 15 MG TABS tablet Take 15 mg by mouth every morning.  . tamsulosin (FLOMAX) 0.4 MG CAPS capsule Take 2 capsules (0.8 mg total) by mouth daily.  Marland Kitchen tiotropium (SPIRIVA) 18 MCG inhalation capsule Place 1 capsule (18 mcg total) into inhaler and inhale daily.  . traMADol (ULTRAM) 50 MG tablet TAKE 1 TABLET EVERY 12 HOURS AS NEEDED  . traZODone (DESYREL) 100 MG tablet TAKE 2 TABLETS (200 MG TOTAL) BY MOUTH AT BEDTIME.  Marland Kitchen triamcinolone cream (KENALOG) 0.1 % APPLY TOPICALLY 2 (TWO) TIMES DAILY. (Patient taking differently: APPLY TOPICALLY 2 (TWO) TIMES DAILY. AS NEEDED)   No facility-administered encounter medications on file as of 09/10/2015.

## 2015-09-10 NOTE — Assessment & Plan Note (Signed)
The airflow obstruction in 2014 was only moderate, and that he's had worsening shortness of breath and chronic respiratory therapy hypoxemia lately, he has not necessarily had worsening COPD symptoms. This concerns me that there is no alternative diagnosis to explain his worsening respiratory failure.  Plan: Continue Spiriva See below

## 2015-09-11 ENCOUNTER — Encounter (INDEPENDENT_AMBULATORY_CARE_PROVIDER_SITE_OTHER): Payer: Medicare Other | Admitting: Pulmonary Disease

## 2015-09-11 DIAGNOSIS — J9611 Chronic respiratory failure with hypoxia: Secondary | ICD-10-CM

## 2015-09-11 LAB — PULMONARY FUNCTION TEST
DL/VA % pred: 27 %
DL/VA: 1.24 ml/min/mmHg/L
DLCO COR % PRED: 23 %
DLCO COR: 6.86 ml/min/mmHg
DLCO unc % pred: 22 %
DLCO unc: 6.8 ml/min/mmHg
FEF 25-75 POST: 1.08 L/s
FEF 25-75 Pre: 0.89 L/sec
FEF2575-%CHANGE-POST: 21 %
FEF2575-%PRED-POST: 71 %
FEF2575-%PRED-PRE: 58 %
FEV1-%Change-Post: 8 %
FEV1-%PRED-POST: 86 %
FEV1-%Pred-Pre: 79 %
FEV1-POST: 2.07 L
FEV1-Pre: 1.91 L
FEV1FVC-%CHANGE-POST: 7 %
FEV1FVC-%PRED-PRE: 76 %
FEV6-%Change-Post: 0 %
FEV6-%Pred-Post: 111 %
FEV6-%Pred-Pre: 111 %
FEV6-Post: 3.57 L
FEV6-Pre: 3.54 L
FEV6FVC-%Change-Post: 0 %
FEV6FVC-%PRED-POST: 108 %
FEV6FVC-%Pred-Pre: 108 %
FVC-%Change-Post: 0 %
FVC-%PRED-POST: 103 %
FVC-%PRED-PRE: 102 %
FVC-POST: 3.57 L
FVC-PRE: 3.56 L
POST FEV1/FVC RATIO: 58 %
PRE FEV1/FVC RATIO: 54 %
Post FEV6/FVC ratio: 100 %
Pre FEV6/FVC Ratio: 100 %
RV % pred: 94 %
RV: 2.51 L
TLC % PRED: 91 %
TLC: 6.09 L

## 2015-09-17 ENCOUNTER — Ambulatory Visit (INDEPENDENT_AMBULATORY_CARE_PROVIDER_SITE_OTHER)
Admission: RE | Admit: 2015-09-17 | Discharge: 2015-09-17 | Disposition: A | Discharge status: Home / Self Care | Payer: Medicare Other | Source: Ambulatory Visit | Attending: Pulmonary Disease | Admitting: Pulmonary Disease

## 2015-09-17 DIAGNOSIS — J8410 Pulmonary fibrosis, unspecified: Secondary | ICD-10-CM | POA: Diagnosis not present

## 2015-09-17 DIAGNOSIS — J9611 Chronic respiratory failure with hypoxia: Secondary | ICD-10-CM

## 2015-09-17 DIAGNOSIS — J479 Bronchiectasis, uncomplicated: Secondary | ICD-10-CM | POA: Diagnosis not present

## 2015-09-19 ENCOUNTER — Other Ambulatory Visit: Payer: Self-pay | Admitting: Family Medicine

## 2015-09-22 NOTE — Telephone Encounter (Signed)
Electronic refill request. Last Filled:     30 tablet 1 07/22/2015  Last office visit:   07/15/15 acute   Please advise.

## 2015-09-23 ENCOUNTER — Other Ambulatory Visit: Payer: Self-pay | Admitting: Family Medicine

## 2015-09-23 NOTE — Telephone Encounter (Signed)
Please call in.  Thanks.   

## 2015-09-23 NOTE — Telephone Encounter (Signed)
Tramadol called into [facility] [address]

## 2015-09-25 ENCOUNTER — Other Ambulatory Visit: Payer: Medicare Other

## 2015-09-25 ENCOUNTER — Encounter: Payer: Self-pay | Admitting: Pulmonary Disease

## 2015-09-25 ENCOUNTER — Ambulatory Visit (INDEPENDENT_AMBULATORY_CARE_PROVIDER_SITE_OTHER): Payer: Medicare Other | Admitting: Pulmonary Disease

## 2015-09-25 VITALS — BP 124/72 | HR 94 | Ht 69.0 in | Wt 170.0 lb

## 2015-09-25 DIAGNOSIS — J439 Emphysema, unspecified: Secondary | ICD-10-CM | POA: Diagnosis not present

## 2015-09-25 DIAGNOSIS — J479 Bronchiectasis, uncomplicated: Secondary | ICD-10-CM | POA: Diagnosis not present

## 2015-09-25 DIAGNOSIS — J9611 Chronic respiratory failure with hypoxia: Secondary | ICD-10-CM | POA: Diagnosis not present

## 2015-09-25 DIAGNOSIS — J449 Chronic obstructive pulmonary disease, unspecified: Secondary | ICD-10-CM

## 2015-09-25 DIAGNOSIS — Z23 Encounter for immunization: Secondary | ICD-10-CM

## 2015-09-25 MED ORDER — FLUTTER DEVI: 0 refills | Status: AC

## 2015-09-25 NOTE — Progress Notes (Signed)
Attending:  I have seen and examined the patient with nurse practitioner/resident and agree with the note above.  We formulated the plan together and I elicited the following history.    Shortness of breath about the same since the last visit  Still using oxygen  Crackles bases bilaterally, no wheezing, normal effort Irregularly irregular, no murmurs gallops or rubs  CT chest images personally reviewed showing significant bronchiectasis and centrilobular emphysema with no evidence of interstitial lung disease Lung function testing shows no change in airflow obstruction but there is a a notably decreased diffusion capacity.  I believe the reason why his dyspnea and hypoxemia has worsened has been his advanced COPD with bronchiectasis and mucous plugging in the bases of his lungs. Fortunately there is no evidence of a nurse to show lung disease. 4 going to have him use a flutter valve for mucociliary clearance, take Mucinex regularly, and I have stressed the importance of aggressive exercise. We will refer him to pulmonary rehabilitation. If no improvement with these measures then waking consider a chest vest Continue oxygen as prescribed  Roselie Awkward, MD [facility] Pager: [phone] Cell: [phone] After 3pm or if no response, call [phone]

## 2015-09-25 NOTE — Patient Instructions (Addendum)
It is nice to meet you today. We will prescribe a flutter valve today. We will instruct you on its use. Please use the flutter valve 4-5 breaths four to five times daily Mucinex 1200 mg twice daily to help to clear your mucus. Flu Shot today Alpha 1 test today. Try to increase your exercise as tolerated. Continue your oxygen at 5L pulsed with exertion and 2 L continuous at rest. We will refer to Pulmonary rehab in [facility]  Please contact office for sooner follow up if symptoms do not improve or worsen or seek emergency care

## 2015-09-25 NOTE — Progress Notes (Signed)
History of Present Illness [name] is a 82 y.o. male with COPD  Synopsis: former smoker with moderate airflow obstruction due to COPD.  Spirometry in 12/2011 showed clear airflow obstruction and an FEV1 of 2.12 L (67% pred). 09/25/2015  2-3 week follow up for Bronchiectasis: Pt. Presents to the office today for follow up and results of HRCT and PFT's.He states he is feeling about the same since h saw Dr. Lake Bells 2 weeks ago.He is on 5 L pulsed oxygen with exertion and 2L continuous at rest.We discussed the results of his CT and that it indicated bronchiectasis. We also discussed that his PFT's.We discussed the use of Flutter valve and Mucinex.  Tests HRCT 09/17/2015 Indicates Bronchiectasis Diffuse bronchial wall thickening with severe centrilobular and paraseptal emphysema  PFT's 09/11/2015 Moderate airflow obstruction with severely reduced DLCO consistent with COPD, but suggestive of concomitant pulmonary parenchymal or vascular disease.  Past medical hx Past Medical History:  Diagnosis Date  . AF (atrial fibrillation) ([place])    after PTX 2012  . Allergy   . Arthritis    h/o R shoulder injection per ortho  . Back pain   . BPH (benign prostatic hyperplasia)   . COPD (chronic obstructive pulmonary disease) ([place])   . Coronary artery disease   . Elevated MCV   . Erectile dysfunction   . GERD (gastroesophageal reflux disease)   . HSV-1 (herpes simplex virus 1) infection   . Hyperlipidemia   . Spontaneous pneumothorax 09/04/2010   [name]     Past surgical hx, Family hx, Social hx all reviewed.  Current Outpatient Prescriptions on File Prior to Visit  Medication Sig  . acetaminophen (TYLENOL) 325 MG tablet Take 650 mg by mouth every 6 (six) hours as needed.  Marland Kitchen albuterol (PROAIR HFA) 108 (90 BASE) MCG/ACT inhaler Inhale 2 puffs into the lungs every 4 (four) hours as needed for wheezing or shortness of breath.  . Cyanocobalamin (VITAMIN B 12 PO) Take by mouth daily.    Mariane Baumgarten Calcium (STOOL SOFTENER PO) Take by mouth daily.  . fluticasone (FLONASE) 50 MCG/ACT nasal spray Place 2 sprays into both nostrils daily.  . metoprolol tartrate (LOPRESSOR) 25 MG tablet Take 0.5 tablets (12.5 mg total) by mouth as needed (for palpitations).  . Multiple Vitamin (MULTIVITAMIN) tablet Take 1 tablet by mouth daily. Reported on 04/22/2015  . NON FORMULARY Oxygen 2-5 liters 24/7  . omeprazole (PRILOSEC) 20 MG capsule Take 20 mg by mouth daily.    . Rivaroxaban (XARELTO) 15 MG TABS tablet Take 15 mg by mouth every morning.  . tamsulosin (FLOMAX) 0.4 MG CAPS capsule Take 2 capsules (0.8 mg total) by mouth daily.  Marland Kitchen tiotropium (SPIRIVA) 18 MCG inhalation capsule Place 1 capsule (18 mcg total) into inhaler and inhale daily.  . traMADol (ULTRAM) 50 MG tablet TAKE 1 TABLET BY MOUTH EVERY 12 HOURS AS NEEDED FOR PAIN  . traZODone (DESYREL) 100 MG tablet TAKE 2 TABLETS (200 MG TOTAL) BY MOUTH AT BEDTIME.  Marland Kitchen triamcinolone cream (KENALOG) 0.1 % APPLY TOPICALLY 2 (TWO) TIMES DAILY. (Patient taking differently: APPLY TOPICALLY 2 (TWO) TIMES DAILY. AS NEEDED)   No current facility-administered medications on file prior to visit.      Allergies  Allergen Reactions  . Rosuvastatin     REACTION: myalgias at 31m/day dose    Review Of Systems:  Constitutional:   No  weight loss, night sweats,  Fevers, chills, fatigue, or  lassitude.  HEENT:   No headaches,  Difficulty swallowing,  Tooth/dental problems, or  Sore throat,                No sneezing, itching, ear ache, nasal congestion, post nasal drip,   CV:  No chest pain,  Orthopnea, PND, swelling in lower extremities, anasarca, dizziness, palpitations, syncope.   GI  No heartburn, indigestion, abdominal pain, nausea, vomiting, diarrhea, change in bowel habits, loss of appetite, bloody stools.   Resp: + shortness of breath with exertion and at rest.  + excess mucus, + productive cough,  No non-productive cough,  No coughing up of  blood.  No change in color of mucus.  No wheezing.  No chest wall deformity  Skin: no rash or lesions.  GU: no dysuria, change in color of urine, no urgency or frequency.  No flank pain, no hematuria   MS:  No joint pain or swelling.  No decreased range of motion.  No back pain.  Psych:  No change in mood or affect. No depression or anxiety.  No memory loss.   Vital Signs BP 124/72 (BP Location: Left Arm, Cuff Size: Normal)   Pulse 94   Ht _0  (1.753 m)   Wt 170 lb (77.1 kg)   SpO2 91%   BMI 25.10 kg/m    Physical Exam:  General- No distress,  A&Ox3, elderly deconditioned ENT: No sinus tenderness, TM clear, pale nasal mucosa, no oral exudate,no post nasal drip, no LAN Cardiac: S1, S2, regular rate and rhythm, no murmur Chest: No wheeze/ rales/ dullness; diminished per bases,no accessory muscle use, no nasal flaring, no sternal retractions Abd.: Soft Non-tender Ext: No clubbing cyanosis, edema Neuro:  normal strength Skin: No rashes, warm and dry Psych: normal mood and behavior   Assessment/Plan  Bronchiectasis without acute exacerbation (HCC) Bronchiectasis per HRCT 09/17/2015 Plan: We will prescribe a flutter valve today. We will instruct you on its use. Please use the flutter valve 4-5 breaths four to five times daily Mucinex 1200 mg twice daily to help to clear your mucus. Flu Shot today Alpha 1 test today. Try to increase your exercise as tolerated .We will refer to Pulmonary rehab in [place] your oxygen at 5L pulsed with exertion and 2 L continuous at rest. Follow up with Dr. Lake Bells or NP in 1 month. Please contact office for sooner follow up if symptoms do not improve or worsen or seek emergency care     Chronic hypoxemic respiratory failure ([place]) Continue wearing your oxygen  5 L pulsed oxygen with exertion and 2L continuous at rest.    Magdalen Spatz, NP 09/25/2015  4:18 PM

## 2015-09-25 NOTE — Assessment & Plan Note (Addendum)
Bronchiectasis per HRCT 09/17/2015 Plan: We will prescribe a flutter valve today. We will instruct you on its use. Please use the flutter valve 4-5 breaths four to five times daily Mucinex 1200 mg twice daily to help to clear your mucus. Flu Shot today Alpha 1 test today. Try to increase your exercise as tolerated .We will refer to Pulmonary rehab in [place] your oxygen at 5L pulsed with exertion and 2 L continuous at rest. Follow up with Dr. Lake Bells or NP in 1 month. Please contact office for sooner follow up if symptoms do not improve or worsen or seek emergency care

## 2015-09-25 NOTE — Assessment & Plan Note (Signed)
Continue wearing your oxygen  5 L pulsed oxygen with exertion and 2L continuous at rest.

## 2015-09-28 DIAGNOSIS — J449 Chronic obstructive pulmonary disease, unspecified: Secondary | ICD-10-CM | POA: Diagnosis not present

## 2015-09-28 DIAGNOSIS — R0602 Shortness of breath: Secondary | ICD-10-CM | POA: Diagnosis not present

## 2015-10-01 LAB — ALPHA-1 ANTITRYPSIN PHENOTYPE: A-1 Antitrypsin: 178 mg/dL (ref 83–199)

## 2015-10-04 ENCOUNTER — Other Ambulatory Visit: Payer: Self-pay | Admitting: Family Medicine

## 2015-10-04 DIAGNOSIS — E785 Hyperlipidemia, unspecified: Secondary | ICD-10-CM

## 2015-10-04 DIAGNOSIS — J449 Chronic obstructive pulmonary disease, unspecified: Secondary | ICD-10-CM | POA: Diagnosis not present

## 2015-10-05 ENCOUNTER — Ambulatory Visit: Payer: Self-pay

## 2015-10-05 ENCOUNTER — Emergency Department (HOSPITAL_COMMUNITY)
Admission: EM | Admit: 2015-10-05 | Discharge: 2015-10-05 | Disposition: A | Discharge status: Home / Self Care | Payer: Medicare Other | Attending: Emergency Medicine | Admitting: Emergency Medicine

## 2015-10-05 ENCOUNTER — Encounter (HOSPITAL_COMMUNITY): Payer: Self-pay

## 2015-10-05 ENCOUNTER — Emergency Department (HOSPITAL_COMMUNITY): Payer: Medicare Other

## 2015-10-05 DIAGNOSIS — R55 Syncope and collapse: Secondary | ICD-10-CM | POA: Diagnosis not present

## 2015-10-05 DIAGNOSIS — Z85828 Personal history of other malignant neoplasm of skin: Secondary | ICD-10-CM | POA: Insufficient documentation

## 2015-10-05 DIAGNOSIS — Z87891 Personal history of nicotine dependence: Secondary | ICD-10-CM | POA: Diagnosis not present

## 2015-10-05 DIAGNOSIS — R0602 Shortness of breath: Secondary | ICD-10-CM | POA: Insufficient documentation

## 2015-10-05 DIAGNOSIS — I2510 Atherosclerotic heart disease of native coronary artery without angina pectoris: Secondary | ICD-10-CM | POA: Diagnosis not present

## 2015-10-05 DIAGNOSIS — R404 Transient alteration of awareness: Secondary | ICD-10-CM | POA: Diagnosis not present

## 2015-10-05 DIAGNOSIS — R58 Hemorrhage, not elsewhere classified: Secondary | ICD-10-CM | POA: Insufficient documentation

## 2015-10-05 DIAGNOSIS — Z85830 Personal history of malignant neoplasm of bone: Secondary | ICD-10-CM | POA: Diagnosis not present

## 2015-10-05 DIAGNOSIS — R42 Dizziness and giddiness: Secondary | ICD-10-CM | POA: Diagnosis not present

## 2015-10-05 DIAGNOSIS — Z7901 Long term (current) use of anticoagulants: Secondary | ICD-10-CM | POA: Insufficient documentation

## 2015-10-05 DIAGNOSIS — J449 Chronic obstructive pulmonary disease, unspecified: Secondary | ICD-10-CM | POA: Insufficient documentation

## 2015-10-05 DIAGNOSIS — R112 Nausea with vomiting, unspecified: Secondary | ICD-10-CM | POA: Diagnosis not present

## 2015-10-05 LAB — TROPONIN I

## 2015-10-05 LAB — CBC WITH DIFFERENTIAL/PLATELET
Basophils Absolute: 0 10*3/uL (ref 0.0–0.1)
Basophils Relative: 0 %
EOS ABS: 0.1 10*3/uL (ref 0.0–0.7)
Eosinophils Relative: 1 %
HCT: 40.1 % (ref 39.0–52.0)
Hemoglobin: 13.5 g/dL (ref 13.0–17.0)
LYMPHS ABS: 1.6 10*3/uL (ref 0.7–4.0)
Lymphocytes Relative: 15 %
MCH: 35.7 pg — AB (ref 26.0–34.0)
MCHC: 33.7 g/dL (ref 30.0–36.0)
MCV: 106.1 fL — ABNORMAL HIGH (ref 78.0–100.0)
MONO ABS: 0.7 10*3/uL (ref 0.1–1.0)
MONOS PCT: 7 %
NEUTROS PCT: 77 %
Neutro Abs: 7.9 10*3/uL — ABNORMAL HIGH (ref 1.7–7.7)
Platelets: 267 10*3/uL (ref 150–400)
RBC: 3.78 MIL/uL — ABNORMAL LOW (ref 4.22–5.81)
RDW: 14 % (ref 11.5–15.5)
WBC: 10.3 10*3/uL (ref 4.0–10.5)

## 2015-10-05 LAB — COMPREHENSIVE METABOLIC PANEL
ALBUMIN: 4 g/dL (ref 3.5–5.0)
ALT: 9 U/L — AB (ref 17–63)
AST: 15 U/L (ref 15–41)
Alkaline Phosphatase: 57 U/L (ref 38–126)
Anion gap: 7 (ref 5–15)
BUN: 19 mg/dL (ref 6–20)
CHLORIDE: 106 mmol/L (ref 101–111)
CO2: 25 mmol/L (ref 22–32)
CREATININE: 1.29 mg/dL — AB (ref 0.61–1.24)
Calcium: 9.1 mg/dL (ref 8.9–10.3)
GFR calc non Af Amer: 49 mL/min — ABNORMAL LOW (ref >60)
GFR, EST AFRICAN AMERICAN: 57 mL/min — AB (ref >60)
GLUCOSE: 135 mg/dL — AB (ref 65–99)
Potassium: 4 mmol/L (ref 3.5–5.1)
SODIUM: 138 mmol/L (ref 135–145)
Total Bilirubin: 1.1 mg/dL (ref 0.3–1.2)
Total Protein: 7.3 g/dL (ref 6.5–8.1)

## 2015-10-05 LAB — BRAIN NATRIURETIC PEPTIDE: B Natriuretic Peptide: 47.6 pg/mL (ref 0.0–100.0)

## 2015-10-05 NOTE — ED Notes (Signed)
Pt. Ambulated with 4L O2 on and sats remained around 92%

## 2015-10-05 NOTE — ED Triage Notes (Signed)
Pt.had breakfast and stood up and suddenly felt dizzy and diaphoretic. Hy of A-fib that is rate controlled. PVC's noted in the truck. Pt. Is A/Ox4 and on 4L O2 which is normal for him

## 2015-10-05 NOTE — Discharge Instructions (Signed)
As discussed, your evaluation today has been largely reassuring.  But, it is important that you monitor your condition carefully, and do not hesitate to return to the ED if you develop new, or concerning changes in your condition.  Otherwise, please follow-up with your physician for appropriate ongoing care.

## 2015-10-05 NOTE — ED Provider Notes (Signed)
[facility] DEPT Provider Note   CSN: [id number] Arrival date & time: 10/05/15  1204     History   Chief Complaint Chief Complaint  Patient presents with  . Near Syncope    HPI [name] [facility] is a 82 y.o. male.  HPI  Patient presents after an episode of near-syncope. Patient recalls using a pulmonary rehabilitation otherwise, subsequently feeling lightheaded, diaphoretic, nauseous. This pulmonary rehabilitation device is a new to the patient, provided due to history of bronchiectasis, home oxygen use, 24/7. Currently the patient states that he feels better than during his acute event, but is still not back to baseline. Specifically denies pain anywhere including chest, belly, head. He also denies confusion, disorientation. History is provided by the patient with the assistance of his wife. Wife recalls the patient: For help after he felt lightheaded She states that she found him diaphoretic, clammy, but awake and alert.    Past Medical History:  Diagnosis Date  . AF (atrial fibrillation) ([place])    after PTX 2012  . Allergy   . Arthritis    h/o R shoulder injection per ortho  . Back pain   . BPH (benign prostatic hyperplasia)   . COPD (chronic obstructive pulmonary disease) ([place])   . Coronary artery disease   . Elevated MCV   . Erectile dysfunction   . GERD (gastroesophageal reflux disease)   . HSV-1 (herpes simplex virus 1) infection   . Hyperlipidemia   . Spontaneous pneumothorax 09/04/2010   [name]    Patient Active Problem List   Diagnosis Date Noted  . Bronchiectasis without acute exacerbation ([place]) 09/25/2015  . Abnormal CBC 07/16/2015  . Excess ear wax 07/16/2015  . Leg ulcer ([place]) 04/30/2015  . Advance care planning 05/23/2014  . Chronic hypoxemic respiratory failure ([place]) 09/05/2013  . Peripheral edema 07/16/2013  . Situational anxiety 07/16/2013  . Chronic anticoagulation- Xarelto since 2012 (PAF) 07/03/2013  . Recurrent spontaneous  pneumothorax- s/p VATS 06/29/13 06/27/2013  . Skin lesion 09/13/2012  . Exertional shortness of breath 06/03/2012  . Fall at home 02/19/2012  . Constipation 10/12/2011  . Back pain 07/14/2011  . Urinary frequency 07/14/2011  . Medicare annual wellness visit, subsequent 04/25/2011  . Insomnia 04/25/2011  . PAD (peripheral artery disease) ([place]) 04/15/2011  . Neuropathy ([place]) 02/15/2011  . ED (erectile dysfunction) 09/28/2010  . PAF (paroxysmal atrial fibrillation)  09/09/2010  . Pneumothorax, left 09/06/2010  . NEOPLASMS UNSPEC NATURE BONE SOFT TISSUE&SKIN 02/10/2010  . MUSCLE CRAMPS 09/01/2009  . NICOTINE ADDICTION 02/06/2008  . UNSPEC DISORDER CARBOHYDRATE TRANSPORT&METAB 01/29/2007  . G E R D 07/28/2006  . HEMORRHOIDS 07/27/2006  . COPD 07/27/2006  . HYPERTHERMIA, MALIGNANT, FH 07/27/2006  . HX, PERSONAL, ALCOHOLISM 07/27/2006  . HERPES SIMPLEX INFECTION, TYPE I 01/17/2006  . GLUCOSE INTOLERANCE 11/18/2002  . ARTHRITIS, CERVICAL SPINE 12/17/2001  . OSTEOPENIA 12/17/2001  . Allergic rhinitis 11/17/2001  . DIVERTICULOSIS, COLON 10/18/1998  . DEPRESSION 04/17/1996  . 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 HLD (hyperlipidemia) 10/17/1985    Past Surgical History:  Procedure Laterality Date  . CATARACT EXTRACTION, BILATERAL    . CHEST TUBE PLACEMENT  09/04/2010  . VIDEO ASSISTED THORACOSCOPY  12/15/2010   Procedure: VIDEO ASSISTED THORACOSCOPY;  Surgeon: Melrose Nakayama, MD;  Location: [facility];  Service: Thoracic;  Laterality: Left;  blebectomy with pleural abrasion  . VIDEO ASSISTED THORACOSCOPY Right 06/28/2013   Procedure: VIDEO ASSISTED THORACOSCOPY with stapling of blebs;  Surgeon: Melrose Nakayama, MD;  Location: [facility];  Service:  Thoracic;  Laterality: Right;  (R) VATS W/BLEBECTOMY       Home Medications    Prior to Admission medications   Medication Sig Start Date End Date Taking? Authorizing Provider  acetaminophen (TYLENOL) 325 MG tablet Take 650 mg by mouth  every 6 (six) hours as needed.    Historical Provider, MD  albuterol (PROAIR HFA) 108 (90 BASE) MCG/ACT inhaler Inhale 2 puffs into the lungs every 4 (four) hours as needed for wheezing or shortness of breath. 12/19/12   Juanito Doom, MD  Cyanocobalamin (VITAMIN B 12 PO) Take by mouth daily.    Historical Provider, MD  Docusate Calcium (STOOL SOFTENER PO) Take by mouth daily.    Historical Provider, MD  fluticasone (FLONASE) 50 MCG/ACT nasal spray Place 2 sprays into both nostrils daily. 01/29/14   Tonia Ghent, MD  metoprolol tartrate (LOPRESSOR) 25 MG tablet Take 0.5 tablets (12.5 mg total) by mouth as needed (for palpitations). 09/09/14   Tonia Ghent, MD  Multiple Vitamin (MULTIVITAMIN) tablet Take 1 tablet by mouth daily. Reported on 04/22/2015    Historical Provider, MD  NON FORMULARY Oxygen 2-5 liters 24/7    Historical Provider, MD  omeprazole (PRILOSEC) 20 MG capsule Take 20 mg by mouth daily.      Historical Provider, MD  Respiratory Therapy Supplies (FLUTTER) DEVI Use as directed 09/25/15   Juanito Doom, MD  Rivaroxaban (XARELTO) 15 MG TABS tablet Take 15 mg by mouth every morning.    Historical Provider, MD  tamsulosin (FLOMAX) 0.4 MG CAPS capsule Take 2 capsules (0.8 mg total) by mouth daily. 01/02/15   Tonia Ghent, MD  tiotropium (SPIRIVA) 18 MCG inhalation capsule Place 1 capsule (18 mcg total) into inhaler and inhale daily. 06/05/15   Tammy S Parrett, NP  traMADol (ULTRAM) 50 MG tablet TAKE 1 TABLET BY MOUTH EVERY 12 HOURS AS NEEDED FOR PAIN 09/23/15   Tonia Ghent, MD  traZODone (DESYREL) 100 MG tablet TAKE 2 TABLETS (200 MG TOTAL) BY MOUTH AT BEDTIME. 07/28/15   Tonia Ghent, MD  triamcinolone cream (KENALOG) 0.1 % APPLY TOPICALLY 2 (TWO) TIMES DAILY. Patient taking differently: APPLY TOPICALLY 2 (TWO) TIMES DAILY. AS NEEDED 06/08/14   Tonia Ghent, MD    Family History Family History  Problem Relation Age of Onset  . Diabetes Mother   . Peripheral Artery  Disease Father   . Colon cancer Brother     possible dx, not certain  . Hypertension    . Stroke Brother   . Prostate cancer Neg Hx   . Heart attack Neg Hx     Social History Social History  Substance Use Topics  . Smoking status: Former Smoker    Packs/day: 1.00    Years: 50.00    Types: Cigarettes    Quit date: 04/14/1999  . Smokeless tobacco: Never Used  . Alcohol use No     Allergies   Rosuvastatin   Review of Systems Review of Systems  Constitutional:       Per HPI, otherwise negative  HENT:       Per HPI, otherwise negative  Respiratory:       Per HPI, otherwise negative  Cardiovascular:       Per HPI, otherwise negative  Gastrointestinal: Positive for nausea.  Endocrine:       Negative aside from HPI  Genitourinary:       Neg aside from HPI   Musculoskeletal:  Per HPI, otherwise negative  Skin: Positive for color change and pallor.  Neurological: Positive for light-headedness. Negative for syncope.     Physical Exam Updated Vital Signs BP 152/99 (BP Location: Right Arm)   Pulse 82   Resp 25   SpO2 99%   Physical Exam  Constitutional: He is oriented to person, place, and time. He appears well-developed. No distress.  HENT:  Head: Normocephalic and atraumatic.  Eyes: Conjunctivae and EOM are normal.  Cardiovascular: Normal rate, regular rhythm and intact distal pulses.   Pulmonary/Chest: Effort normal. No stridor. No respiratory distress.  Abdominal: He exhibits no distension.  Musculoskeletal: He exhibits no edema.  Neurological: He is alert and oriented to person, place, and time. No cranial nerve deficit. Coordination normal.  Skin: Skin is warm and dry.  Ecchymotic area L lateral abdomen - non tender  Psychiatric: He has a normal mood and affect.  Nursing note and vitals reviewed.    ED Treatments / Results  Labs (all labs ordered are listed, but only abnormal results are displayed) Labs Reviewed  COMPREHENSIVE METABOLIC PANEL -  Abnormal; Notable for the following:       Result Value   Glucose, Bld 135 (*)    Creatinine, Ser 1.29 (*)    ALT 9 (*)    GFR calc non Af Amer 49 (*)    GFR calc Af Amer 57 (*)    All other components within normal limits  CBC WITH DIFFERENTIAL/PLATELET - Abnormal; Notable for the following:    RBC 3.78 (*)    MCV 106.1 (*)    MCH 35.7 (*)    Neutro Abs 7.9 (*)    All other components within normal limits  TROPONIN I  BRAIN NATRIURETIC PEPTIDE    EKG  EKG Interpretation  Date/Time:  Monday October 05 2015 12:38:23 EDT Ventricular Rate:  64 PR Interval:  224 QRS Duration: 140 QT Interval:  452 QTC Calculation: 466 R Axis:   78 Text Interpretation:  Sinus rhythm with 1st degree A-V block with occasional Premature ventricular complexes and Premature atrial complexes Right bundle branch block Abnormal ECG Confirmed by Carmin Muskrat  MD [phone]) on 10/05/2015 1:08:45 PM       Radiology Dg Chest 2 View  Result Date: 10/05/2015 CLINICAL DATA:  Shortness of breath for a few days. EXAM: CHEST  2 VIEW COMPARISON:  06/05/2015.  Chest CT 09/17/2015 FINDINGS: Stable hyperinflation in the lungs. Again noted are prominent lung markings with underlying emphysema. Again noted is a compression fracture at T8. No focal airspace disease. Heart and mediastinum are within normal limits. No large pleural effusions. Trachea is midline. IMPRESSION: Chronic lung changes without acute findings. Electronically Signed   By: Markus Daft M.D.   On: 10/05/2015 13:18    Procedures Procedures (including critical care time)  Initial Impression / Assessment and Plan / ED Course  I have reviewed the triage vital signs and the nursing notes.  Pertinent labs & imaging results that were available during my care of the patient were reviewed by me and considered in my medical decision making (see chart for details).  Clinical Course    On repeat exam the patient appears calm, no new complaints, no  distress. Initial results were reassuring.  Update:Patient remains calm, no states that he feels 100% back to normal. Family points out area of ecchymosis on the left lateral abdominal wall. Patient notes minor trauma about 2 weeks ago. On exam, there is no tenderness in this area,  though there is mild diffuse ecchymosis.  3:27 PM Patient states that he feels still in his usual state of health, normal. We discussed possibilities for his episodes of near syncope including trachea hypoxia, or nonsustained A. fib. Patient states that he no longer takes medication for his A. fib, having been advised to stop this from his physicians.  Again we discussed all findings, the patient's wife, daughter-in-law present. Patient will follow up with pulmonology and primary care.   Final Clinical Impressions(s) / ED Diagnoses  Elderly male presents after an episode of near syncope. Notably, the patient was using a pulmonary rehabilitation device, soon thereafter felt lightheaded, diaphoretic. Patient had no chest pain then, nor now. Here, no evidence for ongoing pneumonia, coronary ischemia, bacteremia or sepsis. Patient may have had nonsustained episode of arrhythmia, but there is no evidence for this currently. Patient is already anticoagulated, appropriately. With no ongoing complaints, reassuring findings, the patient was discharged with close outpatient follow-up.   Carmin Muskrat, MD 10/05/15 [phone]

## 2015-10-05 NOTE — ED Notes (Signed)
Papers reviewed with pt. And familyl and they verbalize understanding and intent to follow up. Pt. Leaving with family

## 2015-10-06 ENCOUNTER — Telehealth: Payer: Self-pay | Admitting: Pulmonary Disease

## 2015-10-06 NOTE — Telephone Encounter (Signed)
Pt here to be shown how to use Flutter valve

## 2015-10-06 NOTE — Telephone Encounter (Signed)
Brought pt back to a room and explained to him and his wife how the flutter valve should be used.  They understood these instructions and pt showed me how to use the flutter device properly.

## 2015-10-19 ENCOUNTER — Telehealth: Payer: Self-pay | Admitting: Pulmonary Disease

## 2015-10-19 NOTE — Telephone Encounter (Signed)
I call [facility] they said to let the pt know they will try to call him tomorrow [place]ellen [place]

## 2015-10-19 NOTE — Telephone Encounter (Signed)
Called pt, states he has not been contacted re: [facility] re[facility] referral placed on 09/25/15.    Upon looking at referral note, I see no further documentation after the order was placed.   PCC's please advise on rehab referral.  Thanks!

## 2015-10-19 NOTE — Telephone Encounter (Signed)
ATC, phone rang many times, no answer, no VM

## 2015-10-20 NOTE — Telephone Encounter (Signed)
Spoke with pt. He is aware that [facility] pulmonary rehab should call him today. Nothing further was needed.

## 2015-10-21 ENCOUNTER — Other Ambulatory Visit: Payer: Self-pay | Admitting: Family Medicine

## 2015-10-21 NOTE — Telephone Encounter (Signed)
Received refill request electronically Last refill 09/23/15 #30/1 Last office visit 07/15/15

## 2015-10-22 NOTE — Telephone Encounter (Signed)
Please call in.  Thanks.   

## 2015-10-22 NOTE — Telephone Encounter (Signed)
Medication phoned to pharmacy.

## 2015-10-27 ENCOUNTER — Encounter: Payer: Medicare Other | Attending: Pulmonary Disease | Admitting: Respiratory Therapy

## 2015-10-27 VITALS — Ht 68.5 in | Wt 168.4 lb

## 2015-10-27 DIAGNOSIS — J439 Emphysema, unspecified: Secondary | ICD-10-CM | POA: Insufficient documentation

## 2015-10-27 DIAGNOSIS — J479 Bronchiectasis, uncomplicated: Secondary | ICD-10-CM

## 2015-10-27 DIAGNOSIS — J9611 Chronic respiratory failure with hypoxia: Secondary | ICD-10-CM | POA: Insufficient documentation

## 2015-10-27 DIAGNOSIS — J449 Chronic obstructive pulmonary disease, unspecified: Secondary | ICD-10-CM

## 2015-10-27 NOTE — Patient Instructions (Signed)
Patient Instructions  Patient Details  Name: [name] MRN: [id number] Date of Birth: 1928/05/16 Referring Provider:  Juanito Doom, MD  Below are the personal goals you chose as well as exercise and nutrition goals. Our goal is to help you keep on track towards obtaining and maintaining your goals. We will be discussing your progress on these goals with you throughout the program.  Initial Exercise Prescription:     Initial Exercise Prescription - 10/27/15 1400      Date of Initial Exercise RX and Referring Provider   Date 10/27/15   Referring Provider Simonne Maffucci MD     Oxygen   Oxygen Continuous   Liters 4  may need 6L on treadmill     Treadmill   MPH 0.5   Grade 0   Minutes 15   METs 1.4     NuStep   Level 1   Watts --  60-80 spm   Minutes 15  85mn x3   METs 1.5     Arm Ergometer   Level 1   Watts --  25-35 rpm   Minutes 15  5 min x3   METs 1.4     Prescription Details   Frequency (times per week) 3   Duration Progress to 45 minutes of aerobic exercise without signs/symptoms of physical distress     Intensity   THRR 40-80% of Max Heartrate 102-124   Ratings of Perceived Exertion 11-15   Perceived Dyspnea 0-4     Progression   Progression Continue to progress workloads to maintain intensity without signs/symptoms of physical distress.     Resistance Training   Training Prescription Yes   Weight 2 lbs   Reps 10-12      Exercise Goals: Frequency: Be able to perform aerobic exercise three times per week working toward 3-5 days per week.  Intensity: Work with a perceived exertion of 11 (fairly light) - 15 (hard) as tolerated. Follow your new exercise prescription and watch for changes in prescription as you progress with the program. Changes will be reviewed with you when they are made.  Duration: You should be able to do 30 minutes of continuous aerobic exercise in addition to a 5 minute warm-up and a 5 minute cool-down  routine.  Nutrition Goals: Your personal nutrition goals will be established when you do your nutrition analysis with the dietician.  The following are nutrition guidelines to follow: Cholesterol < 2069mday Sodium < 150022may Fiber: Men over 50 yrs - 30 grams per day  Personal Goals:     Personal Goals and Risk Factors at Admission - 10/27/15 1440      Core Components/Risk Factors/Patient Goals on Admission   Sedentary Yes  Walks driveway at home 5 days/week for 18m97m  Intervention Provide advice, education, support and counseling about physical activity/exercise needs.;Develop an individualized exercise prescription for aerobic and resistive training based on initial evaluation findings, risk stratification, comorbidities and participant's personal goals.   Expected Outcomes Achievement of increased cardiorespiratory fitness and enhanced flexibility, muscular endurance and strength shown through measurements of functional capacity and personal statement of participant.   Increase Strength and Stamina Yes   Intervention Provide advice, education, support and counseling about physical activity/exercise needs.;Develop an individualized exercise prescription for aerobic and resistive training based on initial evaluation findings, risk stratification, comorbidities and participant's personal goals.   Expected Outcomes Achievement of increased cardiorespiratory fitness and enhanced flexibility, muscular endurance and strength shown through measurements of functional capacity and personal statement  of participant.   Improve shortness of breath with ADL's Yes   Intervention Provide education, individualized exercise plan and daily activity instruction to help decrease symptoms of SOB with activities of daily living.   Expected Outcomes Short Term: Achieves a reduction of symptoms when performing activities of daily living.   Develop more efficient breathing techniques such as purse lipped  breathing and diaphragmatic breathing; and practicing self-pacing with activity Yes  Uses PLB; was instructed at [facility]   Intervention Provide education, demonstration and support about specific breathing techniuqes utilized for more efficient breathing. Include techniques such as pursed lipped breathing, diaphragmatic breathing and self-pacing activity.   Expected Outcomes Short Term: Participant will be able to demonstrate and use breathing techniques as needed throughout daily activities.   Increase knowledge of respiratory medications and ability to use respiratory devices properly  Yes  Albuterol MDI; Spacer given; oxygen 2-5l/m   Intervention Provide education and demonstration as needed of appropriate use of medications, inhalers, and oxygen therapy.   Expected Outcomes Short Term: Achieves understanding of medications use. Understands that oxygen is a medication prescribed by physician. Demonstrates appropriate use of inhaler and oxygen therapy.   Lipids Yes   Intervention Provide education and support for participant on nutrition & aerobic/resistive exercise along with prescribed medications to achieve LDL <78m, HDL >454m   Expected Outcomes Short Term: Participant states understanding of desired cholesterol values and is compliant with medications prescribed. Participant is following exercise prescription and nutrition guidelines.;Long Term: Cholesterol controlled with medications as prescribed, with individualized exercise RX and with personalized nutrition plan. Value goals: LDL < 7053mHDL > 40 mg.      Tobacco Use Initial Evaluation: History  Smoking Status   Former Smoker   Packs/day: 1.00   Years: 50.00   Types: Cigarettes   Quit date: 04/14/1999  Smokeless Tobacco   Never Used    Copy of goals given to participant.

## 2015-10-27 NOTE — Progress Notes (Signed)
Pulmonary Individual Treatment Plan  Patient Details  Name: [name] MRN: [id number] Date of Birth: [birth year] Referring Provider:   Flowsheet Row Pulmonary Rehab from 10/27/2015 in [facility] Cardiac and Pulmonary Rehab  Referring Provider  Simonne Maffucci MD      Initial Encounter Date:  Flowsheet Row Pulmonary Rehab from 10/27/2015 in [facility] Cardiac and Pulmonary Rehab  Date  10/27/15  Referring Provider  Simonne Maffucci MD      Visit Diagnosis: COPD, mild ([place])  Bronchiectasis without complication ([place])  Patient's Home Medications on Admission:  Current Outpatient Prescriptions:    acetaminophen (TYLENOL) 325 MG tablet, Take 650 mg by mouth every 6 (six) hours as needed., Disp: , Rfl:    albuterol (PROAIR HFA) 108 (90 BASE) MCG/ACT inhaler, Inhale 2 puffs into the lungs every 4 (four) hours as needed for wheezing or shortness of breath., Disp: 1 Inhaler, Rfl: 2   Cyanocobalamin (VITAMIN B 12 PO), Take by mouth daily., Disp: , Rfl:    Docusate Calcium (STOOL SOFTENER PO), Take by mouth daily., Disp: , Rfl:    fluticasone (FLONASE) 50 MCG/ACT nasal spray, Place 2 sprays into both nostrils daily., Disp: , Rfl:    metoprolol tartrate (LOPRESSOR) 25 MG tablet, Take 0.5 tablets (12.5 mg total) by mouth as needed (for palpitations)., Disp: , Rfl:    Multiple Vitamin (MULTIVITAMIN) tablet, Take 1 tablet by mouth daily. Reported on 04/22/2015, Disp: , Rfl:    NON FORMULARY, Oxygen 2-5 liters 24/7, Disp: , Rfl:    omeprazole (PRILOSEC) 20 MG capsule, Take 20 mg by mouth daily.  , Disp: , Rfl:    Respiratory Therapy Supplies (FLUTTER) DEVI, Use as directed, Disp: 1 each, Rfl: 0   Rivaroxaban (XARELTO) 15 MG TABS tablet, Take 15 mg by mouth every morning., Disp: , Rfl:    tamsulosin (FLOMAX) 0.4 MG CAPS capsule, Take 2 capsules (0.8 mg total) by mouth daily., Disp: 180 capsule, Rfl: 3   tiotropium (SPIRIVA) 18 MCG inhalation capsule, Place 1 capsule (18 mcg total) into  inhaler and inhale daily., Disp: 30 capsule, Rfl: 6   traMADol (ULTRAM) 50 MG tablet, TAKE 1 TABLET BY MOUTH 12 AS NEEDED FOR PAIN, Disp: 60 tablet, Rfl: 2   traZODone (DESYREL) 100 MG tablet, TAKE 2 TABLETS (200 MG TOTAL) BY MOUTH AT BEDTIME., Disp: 180 tablet, Rfl: 1   triamcinolone cream (KENALOG) 0.1 %, APPLY TOPICALLY 2 (TWO) TIMES DAILY. (Patient taking differently: APPLY TOPICALLY 2 (TWO) TIMES DAILY. AS NEEDED), Disp: 30 g, Rfl: 0  Past Medical History: Past Medical History:  Diagnosis Date   AF (atrial fibrillation) ([place])    after PTX 2012   Allergy    Arthritis    h/o R shoulder injection per ortho   Back pain    BPH (benign prostatic hyperplasia)    COPD (chronic obstructive pulmonary disease) (H[facility])    Coronary artery disease    Elevated MCV    Erectile dysfunction    GERD (gastroesophageal reflux disease)    HSV-1 (herpes simplex virus 1) infection    Hyperlipidemia    Spontaneous pneumothorax 09/04/2010   [name]    Tobacco Use: History  Smoking Status   Former Smoker   Packs/day: 1.00   Years: 50.00   Types: Cigarettes   Quit date: 04/14/1999  Smokeless Tobacco   Never Used    Labs: Recent Review Flowsheet Data    Labs for ITP Cardiac and Pulmonary Rehab Latest Ref Rng & Units 06/18/2013 06/27/2013 06/29/2013 05/19/2014 09/15/2014  Cholestrol 0 - 200 mg/dL 206(H) - - 188 -   LDLCALC 0 - 99 mg/dL 137(H) - - 132(H) -   LDLDIRECT mg/dL - - - - -   HDL >39.00 mg/dL 34.70(L) - - 31.50(L) -   Trlycerides 0.0 - 149.0 mg/dL 173.0(H) - - 124.0 -   Hemoglobin A1c 4.6 - 6.5 % - - - - 5.7   PHART 7.350 - 7.450 - 7.466(H) 7.420 - -   PCO2ART 35.0 - 45.0 mmHg - 33.0(L) 36.3 - -   HCO3 20.0 - 24.0 mEq/L - 23.6 23.1 - -   TCO2 0 - 100 mmol/L - 24.6 24.2 - -   ACIDBASEDEF 0.0 - 2.0 mmol/L - - 0.8 - -   O2SAT % - 97.0 93.9 - -       ADL UCSD:     Pulmonary Assessment Scores    Row Name 10/27/15 1435         ADL UCSD   ADL Phase Entry      SOB Score total 77     Rest 0     Walk 3     Stairs 4     Bath 5     Dress 3     Shop 3        Pulmonary Function Assessment:     Pulmonary Function Assessment - 10/27/15 1434      Initial Spirometry Results   FVC% 102 %   FEV1% 79 %   FEV1/FVC Ratio 54   Comments Test done 09/11/15     Post Bronchodilator Spirometry Results   FVC% 103 %   FEV1% 86 %   FEV1/FVC Ratio 58     Breath   Bilateral Breath Sounds Clear;Decreased   Shortness of Breath Yes;Limiting activity;Fear of Shortness of Breath      Exercise Target Goals: Date: 10/27/15  Exercise Program Goal: Individual exercise prescription set with THRR, safety & activity barriers. Participant demonstrates ability to understand and report RPE using BORG scale, to self-measure pulse accurately, and to acknowledge the importance of the exercise prescription.  Exercise Prescription Goal: Starting with aerobic activity 30 plus minutes a day, 3 days per week for initial exercise prescription. Provide home exercise prescription and guidelines that participant acknowledges understanding prior to discharge.  Activity Barriers & Risk Stratification:     Activity Barriers & Cardiac Risk Stratification - 10/27/15 1433      Activity Barriers & Cardiac Risk Stratification   Activity Barriers Shortness of Breath;Deconditioning;Joint Problems   Cardiac Risk Stratification Moderate      6 Minute Walk:     6 Minute Walk    Row Name 10/27/15 1438         6 Minute Walk   Phase Initial     Distance 220 feet     Walk Time 2.25 minutes     # of Rest Breaks 2  12 sec, 3:33     MPH 1.11     METS 1.84     RPE 13     Perceived Dyspnea  3     VO2 Peak 1.33     Symptoms No     Resting HR 81 bpm     Resting BP 134/64     Max Ex. HR 89 bpm     Max Ex. BP 136/74     2 Minute Post BP 136/64       Interval HR   Baseline HR 81     1 Minute  HR 96     2 Minute HR 98     3 Minute HR 89     4 Minute HR 76     5 Minute  HR 68     6 Minute HR 87     2 Minute Post HR 79     Interval Heart Rate? Yes       Interval Oxygen   Interval Oxygen? Yes     Baseline Oxygen Saturation % 93 %     Baseline Liters of Oxygen 5 L  pulsed     1 Minute Oxygen Saturation % 88 %  rest :58-1:10 SOB     1 Minute Liters of Oxygen 5 L     2 Minute Oxygen Saturation % 81 %  at 1:22 dropped to 84%     2 Minute Liters of Oxygen 5 L     3 Minute Oxygen Saturation % 83 %  began seated rest at 3 min     3 Minute Liters of Oxygen 5 L     4 Minute Oxygen Saturation % 86 %     4 Minute Liters of Oxygen 5 L     5 Minute Oxygen Saturation % 88 %  recoved to 90% at 4:55 walking resumed     5 Minute Liters of Oxygen 5 L     6 Minute Oxygen Saturation % 91 %     6 Minute Liters of Oxygen 5 L     2 Minute Post Oxygen Saturation % 91 %     2 Minute Post Liters of Oxygen 5 L        Initial Exercise Prescription:     Initial Exercise Prescription - 10/27/15 1400      Date of Initial Exercise RX and Referring Provider   Date 10/27/15   Referring Provider Simonne Maffucci MD     Oxygen   Oxygen Continuous   Liters 4  may need 6L on treadmill     Treadmill   MPH 0.5   Grade 0   Minutes 15   METs 1.4     NuStep   Level 1   Watts --  60-80 spm   Minutes 15  28mn x3   METs 1.5     Arm Ergometer   Level 1   Watts --  25-35 rpm   Minutes 15  5 min x3   METs 1.4     Prescription Details   Frequency (times per week) 3   Duration Progress to 45 minutes of aerobic exercise without signs/symptoms of physical distress     Intensity   THRR 40-80% of Max Heartrate 102-124   Ratings of Perceived Exertion 11-15   Perceived Dyspnea 0-4     Progression   Progression Continue to progress workloads to maintain intensity without signs/symptoms of physical distress.     Resistance Training   Training Prescription Yes   Weight 2 lbs   Reps 10-12      Perform Capillary Blood Glucose checks as needed.  Exercise  Prescription Changes:   Exercise Comments:     Exercise Comments    Row Name 10/27/15 1448           Exercise Comments Exercise goal is to be able to walk longer without getting SOB          Discharge Exercise Prescription (Final Exercise Prescription Changes):    Nutrition:  Target Goals: Understanding of nutrition guidelines, daily intake of sodium <15072m cholesterol <  254m, calories 30% from fat and 7% or less from saturated fats, daily to have 5 or more servings of fruits and vegetables.  Biometrics:     Pre Biometrics - 10/27/15 1448      Pre Biometrics   Height 5' 8.5" (1.74 m)   Weight 168 lb 6.4 oz (76.4 kg)   Waist Circumference 38.5 inches   Hip Circumference 40.5 inches   Waist to Hip Ratio 0.95 %   BMI (Calculated) 25.3       Nutrition Therapy Plan and Nutrition Goals:   Nutrition Discharge: Rate Your Plate Scores:   Psychosocial: Target Goals: Acknowledge presence or absence of depression, maximize coping skills, provide positive support system. Participant is able to verbalize types and ability to use techniques and skills needed for reducing stress and depression.  Initial Review & Psychosocial Screening:     Initial Psych Review & Screening - 10/27/15 [place] Yes   Comments Mr HHardawaystates he does have times he misses certain activities, such as golf, that he can no longer perform due to his shortness of breath. He has great support from his wife   .     Barriers   Psychosocial barriers to participate in program There are no identifiable barriers or psychosocial needs.;The patient should benefit from training in stress management and relaxation.     Screening Interventions   Interventions Encouraged to exercise;Program counselor consult      Quality of Life Scores:     Quality of Life - 10/27/15 1447      Quality of Life Scores   Health/Function Pre 20.69 %   Socioeconomic Pre 20.29  %   Psych/Spiritual Pre 21 %   Family Pre 21 %   GLOBAL Pre 20.71 %      PHQ-9: Recent Review Flowsheet Data    [facility] 10/27[facility]2015 07/25/2014 05/22/2014 06/01/2012   Decreased Interest 0 0 0 0   Down, Depressed, Hopeless 0 0 0 0   PHQ - 2 Score 0 0 0 0   Altered sleeping 0 - - -   Tired, decreased energy 1 - - -   Change in appetite 0 - - -   Feeling bad or failure about yourself  0 - - -   Trouble concentrating 0 - - -   Moving slowly or fidgety/restless 0 - - -   Suicidal thoughts 0 - - -   PHQ-9 Score 1 - - -   Difficult doing work/chores Not difficult at all - - -      Psychosocial Evaluation and Intervention:   Psychosocial Re-Evaluation:  Education: Education Goals: Education classes will be provided on a weekly basis, covering required topics. Participant will state understanding/return demonstration of topics presented.  Learning Barriers/Preferences:     Learning Barriers/Preferences - 10/27/15 1434      Learning Barriers/Preferences   Learning Barriers None   Learning Preferences None      Education Topics: Initial Evaluation Education: - Verbal, written and demonstration of respiratory meds, RPE/PD scales, oximetry and breathing techniques. Instruction on use of nebulizers and MDIs: cleaning and proper use, rinsing mouth with steroid doses and importance of monitoring MDI activations. [facility]  Date  10/27/15  Educator  LB  Instruction Review Code  2- meets goals/outcomes      General Nutrition Guidelines/Fats and Fiber: -Group instruction provided by  verbal, written material, models and posters to present the general guidelines for heart healthy nutrition. Gives an explanation and review of dietary fats and fiber.   Controlling Sodium/Reading Food Labels: -Group verbal and written material supporting the discussion of sodium use in heart healthy nutrition.  Review and explanation with models, verbal and written materials for utilization of the food label.   Exercise Physiology & Risk Factors: - Group verbal and written instruction with models to review the exercise physiology of the cardiovascular system and associated critical values. Details cardiovascular disease risk factors and the goals associated with each risk factor.   Aerobic Exercise & Resistance Training: - Gives group verbal and written discussion on the health impact of inactivity. On the components of aerobic and resistive training programs and the benefits of this training and how to safely progress through these programs.   Flexibility, Balance, General Exercise Guidelines: - Provides group verbal and written instruction on the benefits of flexibility and balance training programs. Provides general exercise guidelines with specific guidelines to those with heart or lung disease. Demonstration and skill practice provided.   Stress Management: - Provides group verbal and written instruction about the health risks of elevated stress, cause of high stress, and healthy ways to reduce stress.   Depression: - Provides group verbal and written instruction on the correlation between heart/lung disease and depressed mood, treatment options, and the stigmas associated with seeking treatment.   Exercise & Equipment Safety: - Individual verbal instruction and demonstration of equipment use and safety with use of the equipment.   Infection Prevention: - Provides verbal and written material to individual with discussion of infection control including proper hand washing and proper equipment cleaning during exercise session. Flowsheet Row Pulmonary Rehab from 10/27/2015 in [facility] Cardiac and Pulmonary Rehab  Date  10/27/15  Educator  LB  Instruction Review Code  2- meets goals/outcomes      Falls Prevention: - Provides verbal and written material to individual with discussion of  falls prevention and safety. Flowsheet Row Pulmonary Rehab from 10/27/2015 in [facility] Re[facility]  Date  10/27/15  Educator  LB  Instruction Review Code  2- meets goals/outcomes      Diabetes: - Individual verbal and written instruction to review signs/symptoms of diabetes, desired ranges of glucose level fasting, after meals and with exercise. Advice that pre and post exercise glucose checks will be done for 3 sessions at entry of program.   Chronic Lung Diseases: - Group verbal and written instruction to review new updates, new respiratory medications, new advancements in procedures and treatments. Provide informative websites and "800" numbers of self-education.   Lung Procedures: - Group verbal and written instruction to describe testing methods done to diagnose lung disease. Review the outcome of test results. Describe the treatment choices: Pulmonary Function Tests, ABGs and oximetry.   Energy Conservation: - Provide group verbal and written instruction for methods to conserve energy, plan and organize activities. Instruct on pacing techniques, use of adaptive equipment and posture/positioning to relieve shortness of breath.   Triggers: - Group verbal and written instruction to review types of environmental controls: home humidity, furnaces, filters, dust mite/pet prevention, HEPA vacuums. To discuss weather changes, air quality and the benefits of nasal washing.   Exacerbations: - Group verbal and written instruction to provide: warning signs, infection symptoms, calling MD promptly, preventive modes, and value of vaccinations. Review: effective airway clearance, coughing and/or vibration techniques. Create an Sports administrator.   Oxygen: - Individual and group  verbal and written instruction on oxygen therapy. Includes supplement oxygen, available portable oxygen systems, continuous and intermittent flow rates, oxygen safety, concentrators, and Medicare reimbursement  for oxygen.   Respiratory Medications: - Group verbal and written instruction to review medications for lung disease. Drug class, frequency, complications, importance of spacers, rinsing mouth after steroid MDI's, and proper cleaning methods for nebulizers. Flowsheet Row Pulmonary Rehab from 10/27/2015 in [facility] Cardiac and Pulmonary Rehab  Date  10/27/15  Educator  LB  Instruction Review Code  2- meets goals/outcomes      AED/CPR: - Group verbal and written instruction with the use of models to demonstrate the basic use of the AED with the basic ABC's of resuscitation.   Breathing Retraining: - Provides individuals verbal and written instruction on purpose, frequency, and proper technique of diaphragmatic breathing and pursed-lipped breathing. Applies individual practice skills. Flowsheet Row Pulmonary Rehab from 10/27/2015 in [facility] Cardiac and Pulmonary Rehab  Date  10/27/15  Educator  LB  Instruction Review Code  2- meets goals/outcomes      Anatomy and Physiology of the Lungs: - Group verbal and written instruction with the use of models to provide basic lung anatomy and physiology related to function, structure and complications of lung disease.   Heart Failure: - Group verbal and written instruction on the basics of heart failure: signs/symptoms, treatments, explanation of ejection fraction, enlarged heart and cardiomyopathy.   Sleep Apnea: - Individual verbal and written instruction to review Obstructive Sleep Apnea. Review of risk factors, methods for diagnosing and types of masks and machines for OSA.   Anxiety: - Provides group, verbal and written instruction on the correlation between heart/lung disease and anxiety, treatment options, and management of anxiety.   Relaxation: - Provides group, verbal and written instruction about the benefits of relaxation for patients with heart/lung disease. Also provides patients with examples of relaxation  techniques.   Knowledge Questionnaire Score:     Knowledge Questionnaire Score - 10/27/15 1434      Knowledge Questionnaire Score   Pre Score 4/10       Core Components/Risk Factors/Patient Goals at Admission:     Personal Goals and Risk Factors at Admission - 10/27/15 1440      Core Components/Risk Factors/Patient Goals on Admission   Sedentary Yes  Walks driveway at home 5 days/week for 67mns   Intervention Provide advice, education, support and counseling about physical activity/exercise needs.;Develop an individualized exercise prescription for aerobic and resistive training based on initial evaluation findings, risk stratification, comorbidities and participant's personal goals.   Expected Outcomes Achievement of increased cardiorespiratory fitness and enhanced flexibility, muscular endurance and strength shown through measurements of functional capacity and personal statement of participant.   Increase Strength and Stamina Yes   Intervention Provide advice, education, support and counseling about physical activity/exercise needs.;Develop an individualized exercise prescription for aerobic and resistive training based on initial evaluation findings, risk stratification, comorbidities and participant's personal goals.   Expected Outcomes Achievement of increased cardiorespiratory fitness and enhanced flexibility, muscular endurance and strength shown through measurements of functional capacity and personal statement of participant.   Improve shortness of breath with ADL's Yes   Intervention Provide education, individualized exercise plan and daily activity instruction to help decrease symptoms of SOB with activities of daily living.   Expected Outcomes Short Term: Achieves a reduction of symptoms when performing activities of daily living.   Develop more efficient breathing techniques such as purse lipped breathing and diaphragmatic breathing; and practicing self-pacing with  activity  Yes  Uses PLB; was instructed at [facility]   Intervention Provide education, demonstration and support about specific breathing techniuqes utilized for more efficient breathing. Include techniques such as pursed lipped breathing, diaphragmatic breathing and self-pacing activity.   Expected Outcomes Short Term: Participant will be able to demonstrate and use breathing techniques as needed throughout daily activities.   Increase knowledge of respiratory medications and ability to use respiratory devices properly  Yes  Albuterol MDI; Spacer given; oxygen 2-5l/m   Intervention Provide education and demonstration as needed of appropriate use of medications, inhalers, and oxygen therapy.   Expected Outcomes Short Term: Achieves understanding of medications use. Understands that oxygen is a medication prescribed by physician. Demonstrates appropriate use of inhaler and oxygen therapy.   Lipids Yes   Intervention Provide education and support for participant on nutrition & aerobic/resistive exercise along with prescribed medications to achieve LDL <58m, HDL >436m   Expected Outcomes Short Term: Participant states understanding of desired cholesterol values and is compliant with medications prescribed. Participant is following exercise prescription and nutrition guidelines.;Long Term: Cholesterol controlled with medications as prescribed, with individualized exercise RX and with personalized nutrition plan. Value goals: LDL < 7039mHDL > 40 mg.      Core Components/Risk Factors/Patient Goals Review:    Core Components/Risk Factors/Patient Goals at Discharge (Final Review):    ITP Comments:   Comments: [facility] next week and attend 2 days/week.

## 2015-11-02 DIAGNOSIS — J449 Chronic obstructive pulmonary disease, unspecified: Secondary | ICD-10-CM

## 2015-11-02 DIAGNOSIS — J9611 Chronic respiratory failure with hypoxia: Secondary | ICD-10-CM | POA: Diagnosis not present

## 2015-11-02 DIAGNOSIS — J479 Bronchiectasis, uncomplicated: Secondary | ICD-10-CM

## 2015-11-02 DIAGNOSIS — J439 Emphysema, unspecified: Secondary | ICD-10-CM | POA: Diagnosis not present

## 2015-11-02 NOTE — Progress Notes (Signed)
Daily Session Note  Patient Details  Name: [name] MRN: [id number] Date of Birth: 03/07/1928 Referring Provider:   Flowsheet Row Pulmonary Rehab from 10/27/2015 in [facility] Cardiac and Pulmonary Rehab  Referring Provider  McQuaid, Douglas MD      Encounter Date: 11/02/2015  Check In:     Session Check In - 11/02/15 1309      Check-In   Location [facility]-Cardiac & Pulmonary Rehab   Staff Present Laureen Brown, BS, RRT, Respiratory Therapist;Kelly Hayes, BS, ACSM CEP, Exercise Physiologist;Amanda Sommer, BA, ACSM CEP, Exercise Physiologist   Supervising physician immediately available to respond to emergencies [facility] immediately available ER MD   Physician(s) [place] and [place]   Medication changes reported     No   Fall or balance concerns reported    No   Warm-up and Cool-down Performed as group-led instruction   Resistance Training Performed Yes   VAD Patient? No     Pain Assessment   Currently in Pain? No/denies         Goals Met:  Proper associated with RPD/PD & O2 Sat Independence with exercise equipment Exercise tolerated well Strength training completed today  Goals Unmet:  Not Applicable  Comments: First full day of exercise!  Patient was oriented to gym and equipment including functions, settings, policies, and procedures.  Patient's individual exercise prescription and treatment plan were reviewed.  All starting workloads were established based on the results of the 6 minute walk test done at initial orientation visit.  The plan for exercise progression was also introduced and progression will be customized based on patient's performance and goals.    Dr. Mark Miller is Medical Director for [facility] and [facility]. 

## 2015-11-03 ENCOUNTER — Telehealth: Payer: Self-pay | Admitting: Pulmonary Disease

## 2015-11-03 DIAGNOSIS — J449 Chronic obstructive pulmonary disease, unspecified: Secondary | ICD-10-CM | POA: Diagnosis not present

## 2015-11-03 MED ORDER — ALBUTEROL SULFATE HFA 108 (90 BASE) MCG/ACT IN AERS: 2.0000 | INHALATION_SPRAY | RESPIRATORY_TRACT | 2 refills | Status: AC | PRN

## 2015-11-03 NOTE — Telephone Encounter (Signed)
Refill of the proair has been sent to the pharmacy per pharmacy request.  Nothing further is needed.

## 2015-11-04 ENCOUNTER — Encounter: Payer: Medicare Other | Admitting: *Deleted

## 2015-11-04 DIAGNOSIS — J439 Emphysema, unspecified: Secondary | ICD-10-CM | POA: Diagnosis not present

## 2015-11-04 DIAGNOSIS — J9611 Chronic respiratory failure with hypoxia: Secondary | ICD-10-CM | POA: Diagnosis not present

## 2015-11-04 DIAGNOSIS — J479 Bronchiectasis, uncomplicated: Secondary | ICD-10-CM

## 2015-11-04 DIAGNOSIS — J449 Chronic obstructive pulmonary disease, unspecified: Secondary | ICD-10-CM

## 2015-11-04 NOTE — Progress Notes (Signed)
Daily Session Note  Patient Details  Name: [name] MRN: [id number] Date of Birth: 23-Apr-1928 Referring Provider:   Flowsheet Row Pulmonary Rehab from 10/27/2015 in [facility] Cardiac and Pulmonary Rehab  Referring Provider  Simonne Maffucci MD      Encounter Date: 11/04/2015  Check In:     Session Check In - 11/04/15 1355      Check-In   Location [facility]-Cardiac & Pulmonary Rehab   Staff Present Alberteen Sam, MA, ACSM RCEP, Exercise Physiologist;Laureen Owens Shark, BS, RRT, Respiratory Dareen Piano, BA, ACSM CEP, Exercise Physiologist   Supervising physician immediately available to respond to emergencies [facility] immediately available ER MD   Physician(s) Drs. Quentin Cornwall and [place]   Medication changes reported     No   Warm-up and Cool-down Performed as group-led Manufacturing engineer Yes   VAD Patient? No     Pain Assessment   Currently in Pain? No/denies   Multiple Pain Sites No         Goals Met:  Proper associated with RPD/PD & O2 Sat Exercise tolerated well Queuing for purse lip breathing Strength training completed today  Goals Unmet:  Not Applicable  Comments: Pt able to follow exercise prescription today without complaint.  Will continue to monitor for progression.    Dr. Emily Filbert is Medical Director for [place] and [facility].

## 2015-11-09 DIAGNOSIS — J9611 Chronic respiratory failure with hypoxia: Secondary | ICD-10-CM

## 2015-11-09 DIAGNOSIS — J479 Bronchiectasis, uncomplicated: Secondary | ICD-10-CM

## 2015-11-09 DIAGNOSIS — J449 Chronic obstructive pulmonary disease, unspecified: Secondary | ICD-10-CM

## 2015-11-09 DIAGNOSIS — J439 Emphysema, unspecified: Secondary | ICD-10-CM | POA: Diagnosis not present

## 2015-11-09 NOTE — Progress Notes (Signed)
Daily Session Note  Patient Details  Name: [name] MRN: [id number] Date of Birth: [birth year] Referring Provider:   Flowsheet Row Pulmonary Rehab from 10/27/2015 in [facility] Cardiac and Pulmonary Rehab  Referring Provider  Simonne Maffucci MD      Encounter Date: 11/09/2015  Check In:     Session Check In - 11/09/15 1245      Check-In   Location [facility]-Cardiac & Pulmonary Rehab   Staff Present Carson Myrtle, BS, RRT, Respiratory Therapist;Kelly Amedeo Plenty, BS, ACSM CEP, Exercise Physiologist;Kataleena Holsapple Oletta Darter, BA, ACSM CEP, Exercise Physiologist   Supervising physician immediately available to respond to emergencies [facility] immediately available ER MD   Physician(s) Kerman Passey and Yow   Medication changes reported     No   Fall or balance concerns reported    No   Warm-up and Cool-down Performed as group-led instruction   Resistance Training Performed Yes   VAD Patient? No     Pain Assessment   Currently in Pain? No/denies         Goals Met:  Proper associated with RPD/PD & O2 Sat Independence with exercise equipment Exercise tolerated well Strength training completed today  Goals Unmet:  Not Applicable  Comments: Pt able to follow exercise prescription today without complaint.  Will continue to monitor for progression.    Dr. Emily Filbert is Medical Director for [place] and [facility].

## 2015-11-11 ENCOUNTER — Encounter: Payer: Medicare Other | Admitting: *Deleted

## 2015-11-11 DIAGNOSIS — J439 Emphysema, unspecified: Secondary | ICD-10-CM | POA: Diagnosis not present

## 2015-11-11 DIAGNOSIS — J9611 Chronic respiratory failure with hypoxia: Secondary | ICD-10-CM | POA: Diagnosis not present

## 2015-11-11 DIAGNOSIS — J449 Chronic obstructive pulmonary disease, unspecified: Secondary | ICD-10-CM

## 2015-11-11 NOTE — Progress Notes (Signed)
Daily Session Note  Patient Details  Name: [name] MRN: [id number] Date of Birth: January 30, 1928 Referring Provider:   Flowsheet Row Pulmonary Rehab from 10/27/2015 in [facility] and [facility] Rehab  Referring Provider  Simonne Maffucci MD      Encounter Date: 11/11/2015  Check In:     Session Check In - 11/11/15 1207      Check-In   Location [facility]-Cardiac & Pulmonary Rehab   Staff Present Gerlene Burdock, RN, BSN;Laureen Owens Shark, BS, RRT, Respiratory Lennie Hummer, MA, ACSM RCEP, Exercise Physiologist   Supervising physician immediately available to respond to emergencies [facility] immediately available ER MD   Physician(s) Dr. Joni Fears and Dr. Jimmye Yoshimi   Medication changes reported     No   Fall or balance concerns reported    No   Warm-up and Cool-down Performed as group-led instruction   Resistance Training Performed Yes   VAD Patient? No     Pain Assessment   Currently in Pain? No/denies         Goals Met:  Proper associated with RPD/PD & O2 Sat Exercise tolerated well  Goals Unmet:  Not Applicable  Comments:     Dr. Emily Filbert is Medical Director for [place] and [facility].

## 2015-11-16 ENCOUNTER — Encounter: Payer: Medicare Other | Admitting: *Deleted

## 2015-11-16 ENCOUNTER — Encounter: Payer: Self-pay | Admitting: *Deleted

## 2015-11-16 DIAGNOSIS — J9611 Chronic respiratory failure with hypoxia: Secondary | ICD-10-CM | POA: Diagnosis not present

## 2015-11-16 DIAGNOSIS — J449 Chronic obstructive pulmonary disease, unspecified: Secondary | ICD-10-CM

## 2015-11-16 DIAGNOSIS — J439 Emphysema, unspecified: Secondary | ICD-10-CM | POA: Diagnosis not present

## 2015-11-16 DIAGNOSIS — J479 Bronchiectasis, uncomplicated: Secondary | ICD-10-CM

## 2015-11-16 NOTE — Progress Notes (Signed)
Pulmonary Individual Treatment Plan  Patient Details  Name: [name] MRN: [id number] Date of Birth: 02-19-1928 Referring Provider:   Flowsheet Row Pulmonary Rehab from 10/27/2015 in [facility] Cardiac and Pulmonary Rehab  Referring Provider  Simonne Maffucci MD      Initial Encounter Date:  Flowsheet Row Pulmonary Rehab from 10/27/2015 in [facility] Cardiac and Pulmonary Rehab  Date  10/27/15  Referring Provider  Simonne Maffucci MD      Visit Diagnosis: COPD, mild ([place])  Bronchiectasis without complication ([place])  Chronic respiratory failure with hypoxia ([place])  Patient's Home Medications on Admission:  Current Outpatient Prescriptions:  .  acetaminophen (TYLENOL) 325 MG tablet, Take 650 mg by mouth every 6 (six) hours as needed., Disp: , Rfl:  .  albuterol (PROAIR HFA) 108 (90 Base) MCG/ACT inhaler, Inhale 2 puffs into the lungs every 4 (four) hours as needed for wheezing or shortness of breath., Disp: 1 Inhaler, Rfl: 2 .  Cyanocobalamin (VITAMIN B 12 PO), Take by mouth daily., Disp: , Rfl:  .  Docusate Calcium (STOOL SOFTENER PO), Take by mouth daily., Disp: , Rfl:  .  fluticasone (FLONASE) 50 MCG/ACT nasal spray, Place 2 sprays into both nostrils daily., Disp: , Rfl:  .  metoprolol tartrate (LOPRESSOR) 25 MG tablet, Take 0.5 tablets (12.5 mg total) by mouth as needed (for palpitations)., Disp: , Rfl:  .  Multiple Vitamin (MULTIVITAMIN) tablet, Take 1 tablet by mouth daily. Reported on 04/22/2015, Disp: , Rfl:  .  NON FORMULARY, Oxygen 2-5 liters 24/7, Disp: , Rfl:  .  omeprazole (PRILOSEC) 20 MG capsule, Take 20 mg by mouth daily.  , Disp: , Rfl:  .  Respiratory Therapy Supplies (FLUTTER) DEVI, Use as directed, Disp: 1 each, Rfl: 0 .  Rivaroxaban (XARELTO) 15 MG TABS tablet, Take 15 mg by mouth every morning., Disp: , Rfl:  .  tamsulosin (FLOMAX) 0.4 MG CAPS capsule, Take 2 capsules (0.8 mg total) by mouth daily., Disp: 180 capsule, Rfl: 3 .  tiotropium (SPIRIVA) 18 MCG inhalation  capsule, Place 1 capsule (18 mcg total) into inhaler and inhale daily., Disp: 30 capsule, Rfl: 6 .  traMADol (ULTRAM) 50 MG tablet, TAKE 1 TABLET BY MOUTH 12 AS NEEDED FOR PAIN, Disp: 60 tablet, Rfl: 2 .  traZODone (DESYREL) 100 MG tablet, TAKE 2 TABLETS (200 MG TOTAL) BY MOUTH AT BEDTIME., Disp: 180 tablet, Rfl: 1 .  triamcinolone cream (KENALOG) 0.1 %, APPLY TOPICALLY 2 (TWO) TIMES DAILY. (Patient taking differently: APPLY TOPICALLY 2 (TWO) TIMES DAILY. AS NEEDED), Disp: 30 g, Rfl: 0  Past Medical History: Past Medical History:  Diagnosis Date  . AF (atrial fibrillation) ([place])    after PTX 2012  . Allergy   . Arthritis    h/o R shoulder injection per ortho  . Back pain   . BPH (benign prostatic hyperplasia)   . COPD (chronic obstructive pulmonary disease) ([place])   . Coronary artery disease   . Elevated MCV   . Erectile dysfunction   . GERD (gastroesophageal reflux disease)   . HSV-1 (herpes simplex virus 1) infection   . Hyperlipidemia   . Spontaneous pneumothorax 09/04/2010   [name]    Tobacco Use: History  Smoking Status  . Former Smoker  . Packs/day: 1.00  . Years: 50.00  . Types: Cigarettes  . Quit date: 04/14/1999  Smokeless Tobacco  . Never Used    Labs: Recent Review Flowsheet Data    Labs for ITP Cardiac and Pulmonary Rehab Latest Ref Rng &  Units 06/18/2013 06/27/2013 06/29/2013 05/19/2014 09/15/2014   Cholestrol 0 - 200 mg/dL 206(H) - - 188 -   LDLCALC 0 - 99 mg/dL 137(H) - - 132(H) -   LDLDIRECT mg/dL - - - - -   HDL >39.00 mg/dL 34.70(L) - - 31.50(L) -   Trlycerides 0.0 - 149.0 mg/dL 173.0(H) - - 124.0 -   Hemoglobin A1c 4.6 - 6.5 % - - - - 5.7   PHART 7.350 - 7.450 - 7.466(H) 7.420 - -   PCO2ART 35.0 - 45.0 mmHg - 33.0(L) 36.3 - -   HCO3 20.0 - 24.0 mEq/L - 23.6 23.1 - -   TCO2 0 - 100 mmol/L - 24.6 24.2 - -   ACIDBASEDEF 0.0 - 2.0 mmol/L - - 0.8 - -   O2SAT % - 97.0 93.9 - -       ADL UCSD:     Pulmonary Assessment Scores    Row Name 10/27/15  1435         ADL UCSD   ADL Phase Entry     SOB Score total 77     Rest 0     Walk 3     Stairs 4     Bath 5     Dress 3     Shop 3        Pulmonary Function Assessment:     Pulmonary Function Assessment - 10/27/15 1434      Initial Spirometry Results   FVC% 102 %   FEV1% 79 %   FEV1/FVC Ratio 54   Comments Test done 09/11/15     Post Bronchodilator Spirometry Results   FVC% 103 %   FEV1% 86 %   FEV1/FVC Ratio 58     Breath   Bilateral Breath Sounds Clear;Decreased   Shortness of Breath Yes;Limiting activity;Fear of Shortness of Breath      Exercise Target Goals:    Exercise Program Goal: Individual exercise prescription set with THRR, safety & activity barriers. Participant demonstrates ability to understand and report RPE using BORG scale, to self-measure pulse accurately, and to acknowledge the importance of the exercise prescription.  Exercise Prescription Goal: Starting with aerobic activity 30 plus minutes a day, 3 days per week for initial exercise prescription. Provide home exercise prescription and guidelines that participant acknowledges understanding prior to discharge.  Activity Barriers & Risk Stratification:     Activity Barriers & Cardiac Risk Stratification - 10/27/15 1433      Activity Barriers & Cardiac Risk Stratification   Activity Barriers Shortness of Breath;Deconditioning;Joint Problems   Cardiac Risk Stratification Moderate      6 Minute Walk:     6 Minute Walk    Row Name 10/27/15 1438         6 Minute Walk   Phase Initial     Distance 220 feet     Walk Time 2.25 minutes     # of Rest Breaks 2  12 sec, 3:33     MPH 1.11     METS 1.84     RPE 13     Perceived Dyspnea  3     VO2 Peak 1.33     Symptoms No     Resting HR 81 bpm     Resting BP 134/64     Max Ex. HR 89 bpm     Max Ex. BP 136/74     2 Minute Post BP 136/64       Interval HR   Baseline  HR 81     1 Minute HR 96     2 Minute HR 98     3 Minute HR 89      4 Minute HR 76     5 Minute HR 68     6 Minute HR 87     2 Minute Post HR 79     Interval Heart Rate? Yes       Interval Oxygen   Interval Oxygen? Yes     Baseline Oxygen Saturation % 93 %     Baseline Liters of Oxygen 5 L  pulsed     1 Minute Oxygen Saturation % 88 %  rest :58-1:10 SOB     1 Minute Liters of Oxygen 5 L     2 Minute Oxygen Saturation % 81 %  at 1:22 dropped to 84%     2 Minute Liters of Oxygen 5 L     3 Minute Oxygen Saturation % 83 %  began seated rest at 3 min     3 Minute Liters of Oxygen 5 L     4 Minute Oxygen Saturation % 86 %     4 Minute Liters of Oxygen 5 L     5 Minute Oxygen Saturation % 88 %  recoved to 90% at 4:55 walking resumed     5 Minute Liters of Oxygen 5 L     6 Minute Oxygen Saturation % 91 %     6 Minute Liters of Oxygen 5 L     2 Minute Post Oxygen Saturation % 91 %     2 Minute Post Liters of Oxygen 5 L        Initial Exercise Prescription:     Initial Exercise Prescription - 10/27/15 1400      Date of Initial Exercise RX and Referring Provider   Date 10/27/15   Referring Provider Simonne Maffucci MD     Oxygen   Oxygen Continuous   Liters 4  may need 6L on treadmill     Treadmill   MPH 0.5   Grade 0   Minutes 15   METs 1.4     NuStep   Level 1   Watts --  60-80 spm   Minutes 15  26mn x3   METs 1.5     Arm Ergometer   Level 1   Watts --  25-35 rpm   Minutes 15  5 min x3   METs 1.4     Prescription Details   Frequency (times per week) 3   Duration Progress to 45 minutes of aerobic exercise without signs/symptoms of physical distress     Intensity   THRR 40-80% of Max Heartrate 102-124   Ratings of Perceived Exertion 11-15   Perceived Dyspnea 0-4     Progression   Progression Continue to progress workloads to maintain intensity without signs/symptoms of physical distress.     Resistance Training   Training Prescription Yes   Weight 2 lbs   Reps 10-12      Perform Capillary Blood Glucose  checks as needed.  Exercise Prescription Changes:     Exercise Prescription Changes    Row Name 11/02/15 1500 11/11/15 1500           Exercise Review   Progression Yes Yes        Response to Exercise   Blood Pressure (Admit) 120/70 110/62      Blood Pressure (Exercise) 138/82 138/70      Blood  Pressure (Exit) 132/70 126/64      Heart Rate (Admit) 79 bpm 78 bpm      Heart Rate (Exercise) 100 bpm 78 bpm      Heart Rate (Exit) 89 bpm 75 bpm      Oxygen Saturation (Admit) 92 % 92 %      Oxygen Saturation (Exercise) 88 % 94 %      Oxygen Saturation (Exit) 98 % 99 %      Rating of Perceived Exertion (Exercise) 11 12      Perceived Dyspnea (Exercise) 2 3      Symptoms none none      Duration Progress to 45 minutes of aerobic exercise without signs/symptoms of physical distress Progress to 45 minutes of aerobic exercise without signs/symptoms of physical distress      Intensity THRR unchanged THRR unchanged        Progression   Progression Continue to progress workloads to maintain intensity without signs/symptoms of physical distress. Continue to progress workloads to maintain intensity without signs/symptoms of physical distress.      Average METs  - 1.4        Resistance Training   Training Prescription Yes Yes      Weight 2 2 lbs      Reps 10-12 10-12        Interval Training   Interval Training No No        Oxygen   Oxygen Continuous Continuous      Liters  - 4-6        Treadmill   MPH 0.8 0.8      Grade 0 0      Minutes 15  2/2/2/2/2 7  55mn, 5 min       METs  - 1.6        NuStep   Level 1 1      Watts  - -  63 spm      Minutes 15 15      METs  - 1.6        Arm Ergometer   Level 1 1      Minutes 7.5 7.5      METs  - 1         Exercise Comments:     Exercise Comments    Row Name 10/27/15 1448 11/04/15 1356 11/11/15 1536       Exercise Comments Exercise goal is to be able to walk longer without getting SOB First full day of exercise was on  Monday!  Patient was oriented to gym and equipment including functions, settings, policies, and procedures.  Patient's individual exercise prescription and treatment plan were reviewed.  All starting workloads were established based on the results of the 6 minute walk test done at initial orientation visit.  The plan for exercise progression was also introduced and progression will be customized based on patient's performance and goals. [name] is off to a good start with exercise.   He is doing 0.8 mph on treadmill for 2 min intervals for 8 min total!  We will continue to monitor his progression.        Discharge Exercise Prescription (Final Exercise Prescription Changes):     Exercise Prescription Changes - 11/11/15 1500      Exercise Review   Progression Yes     Response to Exercise   Blood Pressure (Admit) 110/62   Blood Pressure (Exercise) 138/70   Blood Pressure (Exit) 126/64   Heart Rate (Admit) 78 bpm  Heart Rate (Exercise) 78 bpm   Heart Rate (Exit) 75 bpm   Oxygen Saturation (Admit) 92 %   Oxygen Saturation (Exercise) 94 %   Oxygen Saturation (Exit) 99 %   Rating of Perceived Exertion (Exercise) 12   Perceived Dyspnea (Exercise) 3   Symptoms none   Duration Progress to 45 minutes of aerobic exercise without signs/symptoms of physical distress   Intensity THRR unchanged     Progression   Progression Continue to progress workloads to maintain intensity without signs/symptoms of physical distress.   Average METs 1.4     Resistance Training   Training Prescription Yes   Weight 2 lbs   Reps 10-12     Interval Training   Interval Training No     Oxygen   Oxygen Continuous   Liters 4-6     Treadmill   MPH 0.8   Grade 0   Minutes 7  48mn, 5 min    METs 1.6     NuStep   Level 1   Watts --  63 spm   Minutes 15   METs 1.6     Arm Ergometer   Level 1   Minutes 7.5   METs 1       Nutrition:  Target Goals: Understanding of nutrition guidelines, daily  intake of sodium <15082m cholesterol <20059mcalories 30% from fat and 7% or less from saturated fats, daily to have 5 or more servings of fruits and vegetables.  Biometrics:     Pre Biometrics - 10/27/15 1448      Pre Biometrics   Height 5' 8.5" (1.74 m)   Weight 168 lb 6.4 oz (76.4 kg)   Waist Circumference 38.5 inches   Hip Circumference 40.5 inches   Waist to Hip Ratio 0.95 %   BMI (Calculated) 25.3       Nutrition Therapy Plan and Nutrition Goals:   Nutrition Discharge: Rate Your Plate Scores:   Psychosocial: Target Goals: Acknowledge presence or absence of depression, maximize coping skills, provide positive support system. Participant is able to verbalize types and ability to use techniques and skills needed for reducing stress and depression.  Initial Review & Psychosocial Screening:     Initial Psych Review & Screening - 10/27/15 [id number]   Comments [name] he does have times he misses certain activities, such as golf, that he can no longer perform due to his shortness of breath. He has great support from his wife   .     Barriers   Psychosocial barriers to participate in program There are no identifiable barriers or psychosocial needs.;The patient should benefit from training in stress management and relaxation.     Screening Interventions   Interventions Encouraged to exercise;Program counselor consult      Quality of Life Scores:     Quality of Life - 10/27/15 1447      Quality of Life Scores   Health/Function Pre 20.69 %   Socioeconomic Pre 20.29 %   Psych/Spiritual Pre 21 %   Family Pre 21 %   GLOBAL Pre 20.71 %      PHQ-9: Recent Review Flowsheet Data    Dep[facility] 10/27/2015 07/25/2014 05/22/2014 06/01/2012   Decreased Interest 0 0 0 0   Down, Depressed, Hopeless 0 0 0 0   PHQ - 2 Score 0 0 0 0   Altered sleeping 0 - - -   Tired,  decreased energy 1 - - -   Change in  appetite 0 - - -   Feeling bad or failure about yourself  0 - - -   Trouble concentrating 0 - - -   Moving slowly or fidgety/restless 0 - - -   Suicidal thoughts 0 - - -   PHQ-9 Score 1 - - -   Difficult doing work/chores Not difficult at all - - -      Psychosocial Evaluation and Intervention:     Psychosocial Evaluation - 11/09/15 1235      Psychosocial Evaluation & Interventions   Interventions Encouraged to exercise with the program and follow exercise prescription;Stress management education;Relaxation education   Comments Counselor met with [name] ([name]) today for initial psychosocial evaluation.  He is an 82 year old gentleman who has been married 3 years and has a strong support system with adult children and active involvement in his local church community.  [name] states he sleeps well (with occasional Tylenol at bedtime) and he has a good appetite.  He denies a history or current symptoms of depression or anxiety and states he is typically in a positive mood.  He has minimal stress in his life although he "worries" some about the health of his wife as she "works too hard taking care of me."  [name] has goals to increase his stamin and strength and to breathe better while in this program.        Psychosocial Re-Evaluation:  Education: Education Goals: Education classes will be provided on a weekly basis, covering required topics. Participant will state understanding/return demonstration of topics presented.  Learning Barriers/Preferences:     Learning Barriers/Preferences - 10/27/15 1434      Learning Barriers/Preferences   Learning Barriers None   Learning Preferences None      Education Topics: Initial Evaluation Education: - Verbal, written and demonstration of respiratory meds, RPE/PD scales, oximetry and breathing techniques. Instruction on use of nebulizers and MDIs: cleaning and proper use, rinsing mouth with steroid doses and importance of monitoring  MDI activations. Flowsheet Row Pulmonary Rehab from 11/02/2015 in [facility] Cardiac and Pulmonary Rehab  Date  10/27/15  Educator  LB  Instruction Review Code  2- meets goals/outcomes      General Nutrition Guidelines/Fats and Fiber: -Group instruction provided by verbal, written material, models and posters to present the general guidelines for heart healthy nutrition. Gives an explanation and review of dietary fats and fiber.   Controlling Sodium/Reading Food Labels: -Group verbal and written material supporting the discussion of sodium use in heart healthy nutrition. Review and explanation with models, verbal and written materials for utilization of the food label.   Exercise Physiology & Risk Factors: - Group verbal and written instruction with models to review the exercise physiology of the cardiovascular system and associated critical values. Details cardiovascular disease risk factors and the goals associated with each risk factor.   Aerobic Exercise & Resistance Training: - Gives group verbal and written discussion on the health impact of inactivity. On the components of aerobic and resistive training programs and the benefits of this training and how to safely progress through these programs.   Flexibility, Balance, General Exercise Guidelines: - Provides group verbal and written instruction on the benefits of flexibility and balance training programs. Provides general exercise guidelines with specific guidelines to those with heart or lung disease. Demonstration and skill practice provided.   Stress Management: - Provides group verbal and written instruction about the  health risks of elevated stress, cause of high stress, and healthy ways to reduce stress.   Depression: - Provides group verbal and written instruction on the correlation between heart/lung disease and depressed mood, treatment options, and the stigmas associated with seeking treatment.   Exercise & Equipment  Safety: - Individual verbal instruction and demonstration of equipment use and safety with use of the equipment.   Infection Prevention: - Provides verbal and written material to individual with discussion of infection control including proper hand washing and proper equipment cleaning during exercise session. Flowsheet Row Pulmonary Rehab from 11/02/2015 in [facility] Cardiac and Pulmonary Rehab  Date  10/27/15  Educator  LB  Instruction Review Code  2- meets goals/outcomes      Falls Prevention: - Provides verbal and written material to individual with discussion of falls prevention and safety. Flowsheet Row Pulmonary Rehab from 11/02/2015 in [facility] Cardiac and Pulmonary Rehab  Date  10/27/15  Educator  LB  Instruction Review Code  2- meets goals/outcomes      Diabetes: - Individual verbal and written instruction to review signs/symptoms of diabetes, desired ranges of glucose level fasting, after meals and with exercise. Advice that pre and post exercise glucose checks will be done for 3 sessions at entry of program.   Chronic Lung Diseases: - Group verbal and written instruction to review new updates, new respiratory medications, new advancements in procedures and treatments. Provide informative websites and "800" numbers of self-education. Flowsheet Row Pulmonary Rehab from 11/02/2015 in [facility] Cardiac and Pulmonary Rehab  Date  11/02/15  Educator  LB  Instruction Review Code  2- meets goals/outcomes      Lung Procedures: - Group verbal and written instruction to describe testing methods done to diagnose lung disease. Review the outcome of test results. Describe the treatment choices: Pulmonary Function Tests, ABGs and oximetry.   Energy Conservation: - Provide group verbal and written instruction for methods to conserve energy, plan and organize activities. Instruct on pacing techniques, use of adaptive equipment and posture/positioning to relieve shortness of  breath.   Triggers: - Group verbal and written instruction to review types of environmental controls: home humidity, furnaces, filters, dust mite/pet prevention, HEPA vacuums. To discuss weather changes, air quality and the benefits of nasal washing.   Exacerbations: - Group verbal and written instruction to provide: warning signs, infection symptoms, calling MD promptly, preventive modes, and value of vaccinations. Review: effective airway clearance, coughing and/or vibration techniques. Create an Sports administrator.   Oxygen: - Individual and group verbal and written instruction on oxygen therapy. Includes supplement oxygen, available portable oxygen systems, continuous and intermittent flow rates, oxygen safety, concentrators, and Medicare reimbursement for oxygen.   Respiratory Medications: - Group verbal and written instruction to review medications for lung disease. Drug class, frequency, complications, importance of spacers, rinsing mouth after steroid MDI's, and proper cleaning methods for nebulizers. Flowsheet Row Pulmonary Rehab from 11/02/2015 in [facility] Cardiac and Pulmonary Rehab  Date  10/27/15  Educator  LB  Instruction Review Code  2- meets goals/outcomes      AED/CPR: - Group verbal and written instruction with the use of models to demonstrate the basic use of the AED with the basic ABC's of resuscitation.   Breathing Retraining: - Provides individuals verbal and written instruction on purpose, frequency, and proper technique of diaphragmatic breathing and pursed-lipped breathing. Applies individual practice skills. Flowsheet Row Pulmonary Rehab from 11/02/2015 in [facility] Cardiac and Pulmonary Rehab  Date  10/27/15  Educator  LB  Instruction  Review Code  2- meets goals/outcomes      Anatomy and Physiology of the Lungs: - Group verbal and written instruction with the use of models to provide basic lung anatomy and physiology related to function, structure and complications of  lung disease.   Heart Failure: - Group verbal and written instruction on the basics of heart failure: signs/symptoms, treatments, explanation of ejection fraction, enlarged heart and cardiomyopathy.   Sleep Apnea: - Individual verbal and written instruction to review Obstructive Sleep Apnea. Review of risk factors, methods for diagnosing and types of masks and machines for OSA.   Anxiety: - Provides group, verbal and written instruction on the correlation between heart/lung disease and anxiety, treatment options, and management of anxiety.   Relaxation: - Provides group, verbal and written instruction about the benefits of relaxation for patients with heart/lung disease. Also provides patients with examples of relaxation techniques.   Knowledge Questionnaire Score:     Knowledge Questionnaire Score - 10/27/15 1434      Knowledge Questionnaire Score   Pre Score 4/10       Core Components/Risk Factors/Patient Goals at Admission:     Personal Goals and Risk Factors at Admission - 10/27/15 1440      Core Components/Risk Factors/Patient Goals on Admission   Sedentary Yes  Walks driveway at home 5 days/week for 26mns   Intervention Provide advice, education, support and counseling about physical activity/exercise needs.;Develop an individualized exercise prescription for aerobic and resistive training based on initial evaluation findings, risk stratification, comorbidities and participant's personal goals.   Expected Outcomes Achievement of increased cardiorespiratory fitness and enhanced flexibility, muscular endurance and strength shown through measurements of functional capacity and personal statement of participant.   Increase Strength and Stamina Yes   Intervention Provide advice, education, support and counseling about physical activity/exercise needs.;Develop an individualized exercise prescription for aerobic and resistive training based on initial evaluation findings, risk  stratification, comorbidities and participant's personal goals.   Expected Outcomes Achievement of increased cardiorespiratory fitness and enhanced flexibility, muscular endurance and strength shown through measurements of functional capacity and personal statement of participant.   Improve shortness of breath with ADL's Yes   Intervention Provide education, individualized exercise plan and daily activity instruction to help decrease symptoms of SOB with activities of daily living.   Expected Outcomes Short Term: Achieves a reduction of symptoms when performing activities of daily living.   Develop more efficient breathing techniques such as purse lipped breathing and diaphragmatic breathing; and practicing self-pacing with activity Yes  Uses PLB; [facility]   Intervention Provide education, demonstration and support about specific breathing techniuqes utilized for more efficient breathing. Include techniques such as pursed lipped breathing, diaphragmatic breathing and self-pacing activity.   Expected Outcomes Short Term: Participant will be able to demonstrate and use breathing techniques as needed throughout daily activities.   Increase knowledge of respiratory medications and ability to use respiratory devices properly  Yes  Albuterol MDI; Spacer given; oxygen 2-5l/m   Intervention Provide education and demonstration as needed of appropriate use of medications, inhalers, and oxygen therapy.   Expected Outcomes Short Term: Achieves understanding of medications use. Understands that oxygen is a medication prescribed by physician. Demonstrates appropriate use of inhaler and oxygen therapy.   Lipids Yes   Intervention Provide education and support for participant on nutrition & aerobic/resistive exercise along with prescribed medications to achieve LDL <764m HDL >4014m  Expected Outcomes Short Term: Participant states understanding of desired cholesterol  values and is  compliant with medications prescribed. Participant is following exercise prescription and nutrition guidelines.;Long Term: Cholesterol controlled with medications as prescribed, with individualized exercise RX and with personalized nutrition plan. Value goals: LDL < 28m, HDL > 40 mg.      Core Components/Risk Factors/Patient Goals Review:    Core Components/Risk Factors/Patient Goals at Discharge (Final Review):    ITP Comments:   Comments: 30 Day Review

## 2015-11-16 NOTE — Progress Notes (Signed)
Daily Session Note  Patient Details  Name: [name] MRN: [id number] Date of Birth: [birth year] Referring Provider:   Flowsheet Row Pulmonary Rehab from 10/27/2015 in [facility] Cardiac and Pulmonary Rehab  Referring Provider  Simonne Maffucci MD      Encounter Date: 11/16/2015  Check In:     Session Check In - 11/16/15 1331      Check-In   Location [facility]-Cardiac & Pulmonary Rehab   Staff Present Gerlene Burdock, RN, Moises Blood, BS, ACSM CEP, Exercise Physiologist;Laureen Owens Shark, BS, RRT, Respiratory Therapist   Supervising physician immediately available to respond to emergencies [facility] immediately available ER MD   Physician(s) Dr. Corky Downs and Dr. Jimmye Gale   Medication changes reported     No   Fall or balance concerns reported    No   Warm-up and Cool-down Performed as group-led instruction   Resistance Training Performed Yes   VAD Patient? No     Pain Assessment   Currently in Pain? No/denies         Goals Met:  Proper associated with RPD/PD & O2 Sat Exercise tolerated well  Goals Unmet:  Not Applicable  Comments:     Dr. Emily Filbert is Medical Director for [place] and [facility].

## 2015-11-17 ENCOUNTER — Encounter: Payer: Self-pay | Admitting: Podiatry

## 2015-11-17 ENCOUNTER — Ambulatory Visit (INDEPENDENT_AMBULATORY_CARE_PROVIDER_SITE_OTHER): Payer: Medicare Other | Admitting: Podiatry

## 2015-11-17 VITALS — Ht 69.0 in | Wt 170.0 lb

## 2015-11-17 DIAGNOSIS — M79676 Pain in unspecified toe(s): Secondary | ICD-10-CM | POA: Diagnosis not present

## 2015-11-17 DIAGNOSIS — B351 Tinea unguium: Secondary | ICD-10-CM

## 2015-11-17 NOTE — Progress Notes (Signed)
Patient ID: Niv Darley [facility], male   DOB: 06-03-1928, 82 y.o.   MRN: [id number] Complaint:  Visit Type: Patient returns to my office for continued preventative foot care services. Complaint: Patient states" my nails have grown long and thick and become painful to walk and wear shoes" Patient has been diagnosed with glucose intolerance with neuropathy.. The patient presents for preventative foot care services. No changes to ROS  Podiatric Exam: Vascular: dorsalis pedis and posterior tibial pulses are palpable bilateral. Capillary return is immediate. Temperature gradient is WNL. Skin turgor WNL  Sensorium: Normal Semmes Weinstein monofilament test. Normal tactile sensation bilaterally. Nail Exam: Pt has thick disfigured discolored nails with subungual debris noted bilateral entire nail hallux through fifth toenails Ulcer Exam: There is no evidence of ulcer or pre-ulcerative changes or infection. Orthopedic Exam: Muscle tone and strength are WNL. No limitations in general ROM. No crepitus or effusions noted. HAV  B/L with overlapping second B/L Skin: No Porokeratosis. No infection or ulcers.    Diagnosis:  Onychomycosis, , Pain in right toe, pain in left toes  Treatment & Plan Procedures and Treatment: Consent by patient was obtained for treatment procedures. The patient understood the discussion of treatment and procedures well. All questions were answered thoroughly reviewed. Debridement of mycotic and hypertrophic toenails, 1 through 5 bilateral and clearing of subungual debris. No ulceration, no infection noted.   Return Visit-Office Procedure: Patient instructed to return to the office for a follow up visit 10 weeks  for continued evaluation and treatment.    Gardiner Barefoot DPM

## 2015-11-18 ENCOUNTER — Encounter: Payer: Medicare Other | Attending: Pulmonary Disease | Admitting: *Deleted

## 2015-11-18 DIAGNOSIS — J9611 Chronic respiratory failure with hypoxia: Secondary | ICD-10-CM | POA: Diagnosis not present

## 2015-11-18 DIAGNOSIS — J439 Emphysema, unspecified: Secondary | ICD-10-CM | POA: Insufficient documentation

## 2015-11-18 DIAGNOSIS — J479 Bronchiectasis, uncomplicated: Secondary | ICD-10-CM

## 2015-11-18 DIAGNOSIS — J449 Chronic obstructive pulmonary disease, unspecified: Secondary | ICD-10-CM

## 2015-11-18 NOTE — Progress Notes (Signed)
Daily Session Note  Patient Details  Name: [name] MRN: [id number] Date of Birth: 1928-08-02 Referring Provider:   Flowsheet Row Pulmonary Rehab from 10/27/2015 in [facility] Cardiac and Pulmonary Rehab  Referring Provider  Simonne Maffucci MD      Encounter Date: 11/18/2015  Check In:     Session Check In - 11/18/15 1149      Check-In   Location [facility]-Cardiac & Pulmonary Rehab   Staff Present Alberteen Sam, MA, ACSM RCEP, Exercise Physiologist;Susanne Bice, RN, BSN, CCRP;Laureen Owens Shark, BS, RRT, Respiratory Therapist   Supervising physician immediately available to respond to emergencies [facility] immediately available ER MD   Physician(s) Drs. Alfred Levins and Lord   Medication changes reported     No   Fall or balance concerns reported    No   Warm-up and Cool-down Performed as group-led Location manager Performed Yes   VAD Patient? No     Pain Assessment   Currently in Pain? No/denies   Multiple Pain Sites No         Goals Met:  Proper associated with RPD/PD & O2 Sat Independence with exercise equipment Using PLB without cueing & demonstrates good technique Exercise tolerated well Strength training completed today  Goals Unmet:  Not Applicable  Comments:  Pt able to follow exercise prescription today without complaint.  Will continue to monitor for progression.    Dr. Emily Filbert is Medical Director for [place] and [facility].

## 2015-11-23 DIAGNOSIS — J479 Bronchiectasis, uncomplicated: Secondary | ICD-10-CM

## 2015-11-23 DIAGNOSIS — J449 Chronic obstructive pulmonary disease, unspecified: Secondary | ICD-10-CM

## 2015-11-23 DIAGNOSIS — J9611 Chronic respiratory failure with hypoxia: Secondary | ICD-10-CM

## 2015-11-23 DIAGNOSIS — J439 Emphysema, unspecified: Secondary | ICD-10-CM | POA: Diagnosis not present

## 2015-11-23 NOTE — Progress Notes (Signed)
Daily Session Note  Patient Details  Name: [name] MRN: [id number] Date of Birth: 08/28/1928 Referring Provider:   Flowsheet Row Pulmonary Rehab from 10/27/2015 in [facility] Cardiac and Pulmonary Rehab  Referring Provider  Simonne Maffucci MD      Encounter Date: 11/23/2015  Check In:     Session Check In - 11/23/15 1308      Check-In   Location [facility]-Cardiac & Pulmonary Rehab   Staff Present Carson Myrtle, BS, RRT, Respiratory Therapist;Kelly Amedeo Plenty, BS, ACSM CEP, Exercise Physiologist;Lorain Fettes Oletta Darter, BA, ACSM CEP, Exercise Physiologist   Supervising physician immediately available to respond to emergencies [facility] immediately available ER MD   Physician(s) Jimmye Elijan and Corky Downs   Medication changes reported     No   Fall or balance concerns reported    No   Warm-up and Cool-down Performed as group-led Location manager Performed Yes   VAD Patient? No     Pain Assessment   Currently in Pain? No/denies         Goals Met:  Proper associated with RPD/PD & O2 Sat Independence with exercise equipment Changing diet to healthy choices, watching portion sizes Strength training completed today  Goals Unmet:  Not Applicable  Comments: Pt able to follow exercise prescription today without complaint.  Will continue to monitor for progression.    Dr. Emily Filbert is Medical Director for [place] and [facility].

## 2015-11-25 ENCOUNTER — Encounter: Payer: Medicare Other | Admitting: *Deleted

## 2015-11-25 DIAGNOSIS — J9611 Chronic respiratory failure with hypoxia: Secondary | ICD-10-CM | POA: Diagnosis not present

## 2015-11-25 DIAGNOSIS — J449 Chronic obstructive pulmonary disease, unspecified: Secondary | ICD-10-CM

## 2015-11-25 DIAGNOSIS — J439 Emphysema, unspecified: Secondary | ICD-10-CM | POA: Diagnosis not present

## 2015-11-25 DIAGNOSIS — J479 Bronchiectasis, uncomplicated: Secondary | ICD-10-CM

## 2015-11-25 NOTE — Progress Notes (Signed)
Daily Session Note  Patient Details  Name: [name] MRN: [id number] Date of Birth: [birth year] Referring Provider:   Flowsheet Row Pulmonary Rehab from 10/27/2015 in [facility] Cardiac and Pulmonary Rehab  Referring Provider  Simonne Maffucci MD      Encounter Date: 11/25/2015  Check In:     Session Check In - 11/25/15 1211      Check-In   Location [facility]-Cardiac & Pulmonary Rehab   Staff Present Gerlene Burdock, RN, BSN;Susanne Bice, RN, BSN, Lance Sell, BA, ACSM CEP, Exercise Physiologist   Supervising physician immediately available to respond to emergencies [facility] immediately available ER MD   Physician(s) Dr. Ciro Backer and Burlene Arnt   Medication changes reported     No   Fall or balance concerns reported    Yes   Warm-up and Cool-down Performed as group-led instruction   Resistance Training Performed Yes   VAD Patient? No     Pain Assessment   Currently in Pain? No/denies         Goals Met:  Proper associated with RPD/PD & O2 Sat Exercise tolerated well  Goals Unmet:  Not Applicable  Comments:     Dr. Emily Filbert is Medical Director for [place] and [facility].

## 2015-11-26 ENCOUNTER — Other Ambulatory Visit: Payer: Self-pay | Admitting: Family Medicine

## 2015-11-26 ENCOUNTER — Ambulatory Visit (INDEPENDENT_AMBULATORY_CARE_PROVIDER_SITE_OTHER): Payer: Medicare Other

## 2015-11-26 VITALS — BP 124/80 | HR 68 | Temp 97.7°F | Ht 67.0 in | Wt 171.0 lb

## 2015-11-26 DIAGNOSIS — E784 Other hyperlipidemia: Secondary | ICD-10-CM | POA: Diagnosis not present

## 2015-11-26 DIAGNOSIS — R739 Hyperglycemia, unspecified: Secondary | ICD-10-CM | POA: Diagnosis not present

## 2015-11-26 DIAGNOSIS — E7849 Other hyperlipidemia: Secondary | ICD-10-CM

## 2015-11-26 DIAGNOSIS — Z0000 Encounter for general adult medical examination without abnormal findings: Secondary | ICD-10-CM

## 2015-11-26 NOTE — Progress Notes (Signed)
Pre visit review using our clinic review tool, if applicable. No additional management support is needed unless otherwise documented below in the visit note.

## 2015-11-26 NOTE — Progress Notes (Signed)
Subjective:   [name] is a 82 y.o. male who presents for Medicare Annual/Subsequent preventive examination.  Review of Systems:  N/A Cardiac Risk Factors include: advanced age (>78mn, >>33women);male gender;dyslipidemia     Objective:    Vitals: BP 124/80 (BP Location: Left Arm, Patient Position: Sitting, Cuff Size: Normal)   Pulse 68   Temp 97.7 F (36.5 C) (Oral)   Ht _0  (1.702 m) Comment: no shoes  Wt 171 lb (77.6 kg)   SpO2 95%   BMI 26.78 kg/m   Body mass index is 26.78 kg/m.  Tobacco History  Smoking Status  . Former Smoker  . Packs/day: 1.00  . Years: 50.00  . Types: Cigarettes  . Quit date: 04/14/1999  Smokeless Tobacco  . Never Used     Counseling given: No   Past Medical History:  Diagnosis Date  . AF ([place]rial fibrillation) ([place]    after PTX 2012  . Allergy   . Arthritis    h/o R shoulder injection per ortho  . Back pain   . BPH (benign prostatic hyperplasia)   . COPD (chronic obstructive pulmonary disease) ([place]   . Coronary artery disease   . Elevated MCV   . Erectile dysfunction   . GERD (gastroesophageal reflux disease)   . HSV-1 (herpes simplex virus 1) infection   . Hyperlipidemia   . Spontaneous pneumothorax 09/04/2010   [name]   Past Surgical History:  Procedure Laterality Date  . CATARACT EXTRACTION, BILATERAL    . CHEST TUBE PLACEMENT  09/04/2010  . VIDEO ASSISTED THORACOSCOPY  12/15/2010   Procedure: VIDEO ASSISTED THORACOSCOPY;  Surgeon: SMelrose Nakayama MD;  [place] [facility]  Service: Thoracic;  Laterality: Left;  blebectomy with pleural abrasion  . VIDEO ASSISTED THORACOSCOPY Right 06/28/2013   Procedure: VIDEO ASSISTED THORACOSCOPY with stapling of blebs;  Surgeon: SMelrose Nakayama MD;  [place]  Service: Thoracic;  Laterality: Right;  (R) VATS W/BLEBECTOMY   Family History  Problem Relation Age of Onset  . Diabetes Mother   . Peripheral Artery Disease Father   . Colon cancer Brother       possible dx, not certain  . Hypertension    . Stroke Brother   . Prostate cancer Neg Hx   . Heart attack Neg Hx    History  Sexual Activity  . Sexual activity: Yes  . Birth control/ protection: None    Outpatient Encounter Prescriptions as of 11/26/2015  Medication Sig  . acetaminophen (TYLENOL) 325 MG tablet Take 650 mg by mouth every 6 (six) hours as needed.  .Marland Kitchenalbuterol (PROAIR HFA) 108 (90 Base) MCG/ACT inhaler Inhale 2 puffs into the lungs every 4 (four) hours as needed for wheezing or shortness of breath.  . Cyanocobalamin (VITAMIN B 12 PO) Take by mouth daily.  .Marland KitchenFIBER PO Take 1 capsule by mouth 2 (two) times daily.  . fluticasone (FLONASE) 50 MCG/ACT nasal spray Place 2 sprays into both nostrils daily.  . Multiple Vitamin (MULTIVITAMIN) tablet Take 1 tablet by mouth daily. Reported on 04/22/2015  . NON FORMULARY Oxygen 2-5 liters 24/7  . omeprazole (PRILOSEC) 20 MG capsule Take 20 mg by mouth daily.    .Marland KitchenRespiratory Therapy Supplies (FLUTTER) DEVI Use as directed  . Rivaroxaban (XARELTO) 15 MG TABS tablet Take 15 mg by mouth every morning.  . tamsulosin (FLOMAX) 0.4 MG CAPS capsule Take 2 capsules (0.8 mg total) by mouth daily.  .Marland Kitchentiotropium (SPIRIVA) 18 MCG inhalation capsule  Place 1 capsule (18 mcg total) into inhaler and inhale daily.  . traMADol (ULTRAM) 50 MG tablet TAKE 1 TABLET BY MOUTH 12 AS NEEDED FOR PAIN  . traZODone (DESYREL) 100 MG tablet TAKE 2 TABLETS (200 MG TOTAL) BY MOUTH AT BEDTIME.  . metoprolol tartrate (LOPRESSOR) 25 MG tablet Take 0.5 tablets (12.5 mg total) by mouth as needed (for palpitations). (Patient not taking: Reported on 11/26/2015)  . triamcinolone cream (KENALOG) 0.1 % APPLY TOPICALLY 2 (TWO) TIMES DAILY. (Patient not taking: Reported on 11/26/2015)  . [DISCONTINUED] Docusate Calcium (STOOL SOFTENER PO) Take by mouth daily.   No facility-administered encounter medications on file as of 11/26/2015.     Activities of Daily Living In your  present state of health, do you have any difficulty performing the following activities: 11/26/2015  Hearing? Y  Vision? N  Difficulty concentrating or making decisions? Y  Walking or climbing stairs? Y  Dressing or bathing? Y  Doing errands, shopping? Y  Preparing Food and eating ? N  Using the Toilet? N  In the past six months, have you accidently leaked urine? N  Do you have problems with loss of bowel control? N  Managing your Medications? Y  Managing your Finances? Y  Housekeeping or managing your Housekeeping? Y  Some recent data might be hidden    Patient Care Team: Tonia Ghent, MD as PCP - General (Family Medicine) Melrose Nakayama, MD (Cardiothoracic Surgery) Larey Dresser, MD (Cardiology) Ludwig Clarks, DO as Consulting Physician (Neurology) Juanito Doom, MD as Consulting Physician (Pulmonary Disease)   Assessment:     Hearing Screening   _0  _1  _2  _3  _4  _5  _6  _7  _8   Right ear:   0 0 40  0    Left ear:   0 0 0  0    Vision Screening Comments: Last vision exam in April 2017   Exercise Activities and Dietary recommendations Current Exercise Habits: Structured exercise class, Type of exercise: Other - see comments (pulmonary rehab), Time (Minutes): 60, Frequency (Times/Week): 2, Weekly Exercise (Minutes/Week): 120, Intensity: Moderate, Exercise limited by: None identified  Goals    . Increase physical activity          Starting 11/26/2015, I will continue to do pulmonary rehab for at least 90 min 2 days per week.       Fall Risk Fall Risk  11/26/2015 07/25/2014 05/22/2014 06/01/2012  Falls in the past year? Yes No No Yes  Number falls in past yr: 1 - - 1  Injury with Fall? No - - -  Follow up Falls evaluation completed - - -   Depression Screen PHQ 2/9 Scores 11/26/2015 10/27/2015 07/25/2014 05/22/2014  PHQ - 2 Score 0 0 0 0  PHQ- 9 Score - 1 - -    Cognitive Function MMSE - Mini Mental State Exam 11/26/2015  Orientation  to time 5  Orientation to Place 5  Registration 3  Attention/ Calculation 0  Recall 3  Language- name 2 objects 0  Language- repeat 1  Language- follow 3 step command 3  Language- read & follow direction 0  Write a sentence 0  Copy design 0  Total score 20     PLEASE NOTE: A Mini-Cog screen was completed. Maximum score is 20. A value of 0 denotes this part of [place]stein MMSE was not completed or the patient failed this part of the Mini-Cog screening.   Mini-Cog Screening Orientation to Time - Max 5 pts Orientation to  Place - Max 5 pts Registration - Max 3 pts Recall - Max 3 pts Language Repeat - Max 1 pts Language Follow 3 Step Command - Max 3 pts     Immunization History  Administered Date(s) Administered  . Influenza Split 09/19/2012  . Influenza Whole 10/18/2002, 10/17/2009  . Influenza,inj,Quad PF,36+ Mos 09/25/2015  . Influenza-Unspecified 10/24/2013, 10/03/2014  . Pneumococcal Conjugate-13 09/18/2013  . Pneumococcal Polysaccharide-23 12/17/1996, 12/17/2008  . Td 08/17/1997, 02/10/2009  . Zoster 01/30/2006   Screening Tests Health Maintenance  Topic Date Due  . TETANUS/TDAP  02/11/2019  . INFLUENZA VACCINE  Completed  . ZOSTAVAX  Completed  . PNA vac Low Risk Adult  Completed      Plan:     I have personally reviewed and addressed the Medicare Annual Wellness questionnaire and have noted the following in the patient's chart:  A. Medical and social history B. Use of alcohol, tobacco or illicit drugs  C. Current medications and supplements D. Functional ability and status E.  Nutritional status F.  Physical activity G. Advance directives H. List of other physicians I.  Hospitalizations, surgeries, and ER visits in previous 12 months J.  [place] to include hearing, vision, cognitive, depression L. Referrals and appointments - none  In addition, I have reviewed and discussed with patient certain preventive protocols, quality metrics, and best  practice recommendations. A written personalized care plan for preventive services as well as general preventive health recommendations were provided to patient.  See attached scanned questionnaire for additional information.   Signed,   Lindell Noe, MHA, BS, LPN Health Coach

## 2015-11-26 NOTE — Patient Instructions (Signed)
[name] , Thank you for taking time to come for your Medicare Wellness Visit. I appreciate your ongoing commitment to your health goals. Please review the following plan we discussed and let me know if I can assist you in the future.   These are the goals we discussed: Goals    . Increase physical activity          Starting 11/26/2015, I will continue do pulmonary rehab for at least 90 min 2 days per week.        This is a list of the screening recommended for you and due dates:  Health Maintenance  Topic Date Due  . Tetanus Vaccine  02/11/2019  . Flu Shot  Completed  . Shingles Vaccine  Completed  . Pneumonia vaccines  Completed   Preventive Care for Adults  A healthy lifestyle and preventive care can promote health and wellness. Preventive health guidelines for adults include the following key practices.  . A routine yearly physical is a good way to check with your health care provider about your health and preventive screening. It is a chance to share any concerns and updates on your health and to receive a thorough exam.  . Visit your dentist for a routine exam and preventive care every 6 months. Brush your teeth twice a day and floss once a day. Good oral hygiene prevents tooth decay and gum disease.  . The frequency of eye exams is based on your age, health, family medical history, use  of contact lenses, and other factors. Follow your health care provider's ecommendations for frequency of eye exams.  . Eat a healthy diet. Foods like vegetables, fruits, whole grains, low-fat dairy products, and lean protein foods contain the nutrients you need without too many calories. Decrease your intake of foods high in solid fats, added sugars, and salt. Eat the right amount of calories for you. Get information about a proper diet from your health care provider, if necessary.  . Regular physical exercise is one of the most important things you can do for your health. Most adults  should get at least 150 minutes of moderate-intensity exercise (any activity that increases your heart rate and causes you to sweat) each week. In addition, most adults need muscle-strengthening exercises on 2 or more days a week.  Silver Sneakers may be a benefit available to you. To determine eligibility, you may visit the website: www.silversneakers.com or contact program at (416)437-5733 Mon-Fri between 8AM-8PM.   . Maintain a healthy weight. The body mass index (BMI) is a screening tool to identify possible weight problems. It provides an estimate of body fat based on height and weight. Your health care provider can find your BMI and can help you achieve or maintain a healthy weight.   For adults 20 years and older: ? A BMI below 18.5 is considered underweight. ? A BMI of 18.5 to 24.9 is normal. ? A BMI of 25 to 29.9 is considered overweight. ? A BMI of 30 and above is considered obese.   . Maintain normal blood lipids and cholesterol levels by exercising and minimizing your intake of saturated fat. Eat a balanced diet with plenty of fruit and vegetables. Blood tests for lipids and cholesterol should begin at age 80 and be repeated every 5 years. If your lipid or cholesterol levels are high, you are over 50, or you are at high risk for heart disease, you may need your cholesterol levels checked more frequently. Ongoing high lipid and cholesterol  levels should be treated with medicines if diet and exercise are not working.  . If you smoke, find out from your health care provider how to quit. If you do not use tobacco, please do not start.  . If you choose to drink alcohol, please do not consume more than 2 drinks per day. One drink is considered to be 12 ounces (355 mL) of beer, 5 ounces (148 mL) of wine, or 1.5 ounces (44 mL) of liquor.  . If you are 13-22 years old, ask your health care provider if you should take aspirin to prevent strokes.  . Use sunscreen. Apply sunscreen liberally and  repeatedly throughout the day. You should seek shade when your shadow is shorter than you. Protect yourself by wearing long sleeves, pants, a wide-brimmed hat, and sunglasses year round, whenever you are outdoors.  . Once a month, do a whole body skin exam, using a mirror to look at the skin on your back. Tell your health care provider of new moles, moles that have irregular borders, moles that are larger than a pencil eraser, or moles that have changed in shape or color.

## 2015-11-26 NOTE — Progress Notes (Signed)
PCP notes:   Health maintenance:  No gaps identified. Health maintenance scheduled reviewed.  Abnormal screenings:   Hearing - failed Fall risk - hx of fall with injury  Patient concerns:   None  Nurse concerns:  None  Next PCP appt:   12/24/15 @ 1130  I reviewed health advisor's note, was available for consultation on the day of service listed in this note, and agree with documentation and plan. [name], MD.

## 2015-11-27 LAB — HEMOGLOBIN A1C: HEMOGLOBIN A1C: 5.4 % (ref 4.6–6.5)

## 2015-11-27 LAB — LIPID PANEL
CHOL/HDL RATIO: 5
Cholesterol: 199 mg/dL (ref 0–200)
HDL: 40.6 mg/dL (ref >39.00)
LDL Cholesterol: 124 mg/dL — ABNORMAL HIGH (ref 0–99)
NONHDL: 158.83
Triglycerides: 172 mg/dL — ABNORMAL HIGH (ref 0.0–149.0)
VLDL: 34.4 mg/dL (ref 0.0–40.0)

## 2015-11-30 ENCOUNTER — Encounter: Payer: Medicare Other | Admitting: *Deleted

## 2015-11-30 DIAGNOSIS — J479 Bronchiectasis, uncomplicated: Secondary | ICD-10-CM

## 2015-11-30 DIAGNOSIS — J449 Chronic obstructive pulmonary disease, unspecified: Secondary | ICD-10-CM

## 2015-11-30 DIAGNOSIS — J439 Emphysema, unspecified: Secondary | ICD-10-CM | POA: Diagnosis not present

## 2015-11-30 DIAGNOSIS — J9611 Chronic respiratory failure with hypoxia: Secondary | ICD-10-CM | POA: Diagnosis not present

## 2015-11-30 NOTE — Progress Notes (Signed)
Daily Session Note  Patient Details  Name: [name] MRN: [id number] Date of Birth: [birth year] Referring Provider:   Flowsheet Row Pulmonary Rehab from 10/27/2015 in [facility] Cardiac and Pulmonary Rehab  Referring Provider  Simonne Maffucci MD      Encounter Date: 11/30/2015  Check In:     Session Check In - 11/30/15 1145      Check-In   Location [facility]-Cardiac & Pulmonary Rehab   Staff Present Gerlene Burdock, RN, Vickki Hearing, BA, ACSM CEP, Exercise Physiologist;Kelly Amedeo Plenty, BS, ACSM CEP, Exercise Physiologist   Supervising physician immediately available to respond to emergencies [facility] immediately available ER MD   Physician(s) Dr. Cinda Quest and Dr. Joni Fears   Medication changes reported     No   Fall or balance concerns reported    No   Warm-up and Cool-down Performed as group-led instruction   Resistance Training Performed Yes   VAD Patient? No     Pain Assessment   Currently in Pain? No/denies         Goals Met:  Proper associated with RPD/PD & O2 Sat Exercise tolerated well  Goals Unmet:  Not Applicable  Comments:     Dr. Emily Filbert is Medical Director for [place] and [facility].

## 2015-12-01 ENCOUNTER — Telehealth: Payer: Self-pay | Admitting: Pulmonary Disease

## 2015-12-01 ENCOUNTER — Encounter: Payer: Self-pay | Admitting: Pulmonary Disease

## 2015-12-01 ENCOUNTER — Ambulatory Visit (INDEPENDENT_AMBULATORY_CARE_PROVIDER_SITE_OTHER): Payer: Medicare Other | Admitting: Pulmonary Disease

## 2015-12-01 DIAGNOSIS — J9611 Chronic respiratory failure with hypoxia: Secondary | ICD-10-CM

## 2015-12-01 DIAGNOSIS — J479 Bronchiectasis, uncomplicated: Secondary | ICD-10-CM

## 2015-12-01 DIAGNOSIS — R0602 Shortness of breath: Secondary | ICD-10-CM

## 2015-12-01 NOTE — Assessment & Plan Note (Signed)
Only mild to moderate airflow obstruction, no indication to add more bronchodilator therapy at this time.

## 2015-12-01 NOTE — Assessment & Plan Note (Addendum)
I remain concerned about [name] and his progressive worsening respiratory failure with hypoxemia this year. The only objective new finding we have is the new diagnosis of bronchiectasis. Despite using a flutter valve regularly he has not benefited in terms of dyspnea or symptoms of chest congestion. I believe that he would benefit from a therapy vest. He has been producing mucus daily for a year.  I have personally reviewed the images from his CT chest which show bronchiectasis in the lower lobes particularly.  Plan: Use a therapy vest, will prescribe today Follow-up 6 weeks to see if this is helpful

## 2015-12-01 NOTE — Progress Notes (Signed)
Subjective:    Patient ID: [name], male    DOB: [birth year], 82 y.o.   MRN: [id number]  Synopsis: former smoker with moderate airflow obstruction due to COPD And bronchiectasis.Marland Kitchen  Spirometry in 12/2011 showed clear airflow obstruction and an FEV1 of 2.12 L (67% pred). August 2017 CT chest images personally reviewed showing bronchiectatic changes in addition to findings consistent with COPD (emphysema)   HPI Chief Complaint  Patient presents with  . Follow-up    pt c/o worsening SOB with exertion.     [name] is still going to pulmonary rehab and he says that he is limited somewhat by his oxygen tanks.  He is able to complete the exercise routines.  He feels stronger but he says that his breathing hasn't improved much.  He worries that his breathing has not improved.  Mucus production> he says that he can't tell how much he is producing because he swallows it.  No chest pain lately.  No leg swelling.     Past Medical History:  Diagnosis Date  . AF (atrial fibrillation) ([place])    after PTX 2012  . Allergy   . Arthritis    h/o R shoulder injection per ortho  . Back pain   . BPH (benign prostatic hyperplasia)   . COPD (chronic obstructive pulmonary disease) ([place])   . Coronary artery disease   . Elevated MCV   . Erectile dysfunction   . GERD (gastroesophageal reflux disease)   . HSV-1 (herpes simplex virus 1) infection   . Hyperlipidemia   . Spontaneous pneumothorax 09/04/2010   [name]     Review of Systems  Constitutional: Positive for fatigue. Negative for chills and fever.  HENT: Negative for postnasal drip, rhinorrhea and sinus pressure.   Respiratory: Positive for shortness of breath. Negative for cough and wheezing.   Cardiovascular: Negative for chest pain, palpitations and leg swelling.       Objective:   Physical Exam Vitals:   12/01/15 1120  BP: 134/68  BP Location: Right Arm  Cuff Size: Normal  Pulse: 83  SpO2: 93%  Weight: 168 lb  (76.2 kg)  Height: _0  (1.753 m)   2L [name], 5L with exertion  Gen: chronically ill, appearing HEENT: NCAT, EOMi PULM: Crackles bases B CV: Irreg irreg, no mgr AB: BS+, soft, nontender Ext: warm, no edema  Chest x-ray from 2017 reviewed showing significant emphysema with some question of interstitial changes in the bases bilaterally  Notes from a nurse practitioner in May 2017 reviewed were he was cared for his chronic respiratory therapy hypoxemia and COPD with Spiriva and supplemental oxygen.    Assessment & Plan:   Bronchiectasis without acute exacerbation ([name]) I remain concerned about [name] and his progressive worsening respiratory failure with hypoxemia this year. The only objective new finding we have is the new diagnosis of bronchiectasis. Despite using a flutter valve regularly he has not benefited in terms of dyspnea or symptoms of chest congestion. I believe that he would benefit from a therapy vest.  I have personally reviewed the images from his CT chest which show bronchiectasis in the lower lobes particularly.  Plan: Use a therapy vest, will prescribe today Follow-up 6 weeks to see if this is helpful  Chronic hypoxemic respiratory failure (H[facility]) Again, there has been worsening hypoxemia this year with worsening symptoms. This is due to COPD and bronchiectasis. However, the CT scan of his chest did show some calcification of his aortic valves so we will check  an echocardiogram to ensure there is no other cardiac cause.  Also, because he feels better and can walk further with continuous flow oxygen we will ask his home DME company how he can have a device that will provide 4 L continuous flow.  COPD Only mild to moderate airflow obstruction, no indication to add more bronchodilator therapy at this time.   Updated Medication List Outpatient Encounter Prescriptions as of 12/01/2015  Medication Sig  . acetaminophen (TYLENOL) 325 MG tablet Take 650 mg by mouth every 6  (six) hours as needed.  Marland Kitchen albuterol (PROAIR HFA) 108 (90 Base) MCG/ACT inhaler Inhale 2 puffs into the lungs every 4 (four) hours as needed for wheezing or shortness of breath.  . Cyanocobalamin (VITAMIN B 12 PO) Take by mouth daily.  Marland Kitchen FIBER PO Take 1 capsule by mouth 2 (two) times daily.  . fluticasone (FLONASE) 50 MCG/ACT nasal spray Place 2 sprays into both nostrils daily.  . metoprolol tartrate (LOPRESSOR) 25 MG tablet Take 0.5 tablets (12.5 mg total) by mouth as needed (for palpitations).  . Multiple Vitamin (MULTIVITAMIN) tablet Take 1 tablet by mouth daily. Reported on 04/22/2015  . multivitamin-lutein (OCUVITE-LUTEIN) CAPS capsule Take 1 capsule by mouth daily.  . NON FORMULARY Oxygen 2-5 liters 24/7  . omeprazole (PRILOSEC) 20 MG capsule Take 20 mg by mouth daily.    Marland Kitchen Respiratory Therapy Supplies (FLUTTER) DEVI Use as directed  . Rivaroxaban (XARELTO) 15 MG TABS tablet Take 15 mg by mouth every morning.  . tamsulosin (FLOMAX) 0.4 MG CAPS capsule Take 2 capsules (0.8 mg total) by mouth daily.  Marland Kitchen tiotropium (SPIRIVA) 18 MCG inhalation capsule Place 1 capsule (18 mcg total) into inhaler and inhale daily.  . traMADol (ULTRAM) 50 MG tablet TAKE 1 TABLET BY MOUTH 12 AS NEEDED FOR PAIN  . traZODone (DESYREL) 100 MG tablet TAKE 2 TABLETS (200 MG TOTAL) BY MOUTH AT BEDTIME.  . [DISCONTINUED] triamcinolone cream (KENALOG) 0.1 % APPLY TOPICALLY 2 (TWO) TIMES DAILY. (Patient not taking: Reported on 12/01/2015)   No facility-administered encounter medications on file as of 12/01/2015.

## 2015-12-01 NOTE — Telephone Encounter (Signed)
Spoke with Melissa at [facility], states that for insurance to cover the vest, the office visit note must mention "daily productive cough for at least six months".   BQ please advise when the note is addended- thanks!

## 2015-12-01 NOTE — Patient Instructions (Signed)
We will arrange for a therapy vest through [facility] We will ask [facility] how we can better provide you with a device that administers 4 L of oxygen flow continuously Keep taking your inhaled medicines as you're doing Once you have received the therapy vest use it twice a day We will order an echocardiogram to evaluate your shortness of breath We will see you back in 6 weeks

## 2015-12-01 NOTE — Assessment & Plan Note (Addendum)
Again, there has been worsening hypoxemia this year with worsening symptoms. This is due to COPD and bronchiectasis. However, the CT scan of his chest did show some calcification of his aortic valves so we will check an echocardiogram to ensure there is no other cardiac cause.  Also, because he feels better and can walk further with continuous flow oxygen we will ask his home DME company how he can have a device that will provide 4 L continuous flow.

## 2015-12-02 ENCOUNTER — Encounter: Payer: Medicare Other | Admitting: *Deleted

## 2015-12-02 DIAGNOSIS — J9611 Chronic respiratory failure with hypoxia: Secondary | ICD-10-CM

## 2015-12-02 DIAGNOSIS — J439 Emphysema, unspecified: Secondary | ICD-10-CM | POA: Diagnosis not present

## 2015-12-02 DIAGNOSIS — J479 Bronchiectasis, uncomplicated: Secondary | ICD-10-CM

## 2015-12-02 DIAGNOSIS — J449 Chronic obstructive pulmonary disease, unspecified: Secondary | ICD-10-CM

## 2015-12-02 NOTE — Progress Notes (Signed)
Daily Session Note  Patient Details  Name: [name] MRN: [id number] Date of Birth: 11/15/1928 Referring Provider:   Flowsheet Row Pulmonary Rehab from 10/27/2015 in [facility] Pulmonary Rehab  Referring Provider  Simonne Maffucci MD      Encounter Date: 12/02/2015  Check In:     Session Check In - 12/02/15 1157      Check-In   Location [facility]-Cardiac & Pulmonary Rehab   Staff Present Carson Myrtle, BS, RRT, Respiratory Dareen Piano, BA, ACSM CEP, Exercise Physiologist;Kraig Genis Luan Pulling, MA, ACSM RCEP, Exercise Physiologist;Other   Supervising physician immediately available to respond to emergencies [facility] immediately available ER MD   Physician(s) Drs. Alfred Levins and Morven   Medication changes reported     No   Fall or balance concerns reported    No   Warm-up and Cool-down Performed as group-led Location manager Performed Yes   VAD Patient? No     Pain Assessment   Currently in Pain? No/denies   Multiple Pain Sites No         Goals Met:  Proper associated with RPD/PD & O2 Sat Independence with exercise equipment Using PLB without cueing & demonstrates good technique Exercise tolerated well Queuing for purse lip breathing No report of cardiac concerns or symptoms Strength training completed today  Goals Unmet:  Not Applicable  Comments: Pt able to follow exercise prescription today without complaint.  Will continue to monitor for progression.    Dr. Emily Filbert is Medical Director for [facility] and [facility].

## 2015-12-03 NOTE — Telephone Encounter (Signed)
done

## 2015-12-03 NOTE — Telephone Encounter (Signed)
Left detailed message on Melissa with [facility]'s named vm explaining that note was complete.  Will close encounter.

## 2015-12-04 DIAGNOSIS — J449 Chronic obstructive pulmonary disease, unspecified: Secondary | ICD-10-CM | POA: Diagnosis not present

## 2015-12-07 DIAGNOSIS — J9611 Chronic respiratory failure with hypoxia: Secondary | ICD-10-CM | POA: Diagnosis not present

## 2015-12-07 DIAGNOSIS — J439 Emphysema, unspecified: Secondary | ICD-10-CM | POA: Diagnosis not present

## 2015-12-07 DIAGNOSIS — J449 Chronic obstructive pulmonary disease, unspecified: Secondary | ICD-10-CM

## 2015-12-07 DIAGNOSIS — J479 Bronchiectasis, uncomplicated: Secondary | ICD-10-CM

## 2015-12-07 NOTE — Progress Notes (Signed)
Daily Session Note  Patient Details  Name: [name] MRN: [id number] Date of Birth: 11-14-1928 Referring Provider:   Flowsheet Row Pulmonary Rehab from 10/27/2015 in [facility] Card[facility] and Pulmonary Rehab  Referring Provider  Simonne Maffucci MD      Encounter Date: 12/07/2015  Check In:     Session Check In - 12/07/15 1307      Check-In   Location [facility]-Cardiac & Pulmonary Rehab   Staff Present Carson Myrtle, BS, RRT, Respiratory Therapist;Kelly Amedeo Plenty, BS, ACSM CEP, Exercise Physiologist;Jonda Alanis Oletta Darter, BA, ACSM CEP, Exercise Physiologist   Supervising physician immediately available to respond to emergencies [facility] immediately available ER MD   Physician(s) Cinda Quest and [place]   Medication changes reported     No   Fall or balance concerns reported    No   Warm-up and Cool-down Performed as group-led instruction   Resistance Training Performed Yes   VAD Patient? No     Pain Assessment   Currently in Pain? No/denies         Goals Met:  Proper associated with RPD/PD & O2 Sat Independence with exercise equipment Exercise tolerated well Strength training completed today  Goals Unmet:  Not Applicable  Comments: Pt able to follow exercise prescription today without complaint.  Will continue to monitor for progression.    Dr. Emily Filbert is Medical Director for [place] and [facility].

## 2015-12-08 NOTE — Progress Notes (Signed)
[name] psychosocial assessment reveals no barriers at this time to participation in Pulmonary Rehab.  he  has good family and friend support that encourages [name] to participate in [facility] and progress with His goals.  [name] concerns are monitored, but he  has acknowledge that attending the program has helped to maintain quality life with improved mobility, self-care, and emotional and financial stability.  [name] is commended for regular attendance and self-motivation to improve His pulmonary disease management.

## 2015-12-14 ENCOUNTER — Encounter: Payer: Self-pay | Admitting: *Deleted

## 2015-12-14 DIAGNOSIS — J439 Emphysema, unspecified: Secondary | ICD-10-CM | POA: Diagnosis not present

## 2015-12-14 DIAGNOSIS — J449 Chronic obstructive pulmonary disease, unspecified: Secondary | ICD-10-CM

## 2015-12-14 DIAGNOSIS — J9611 Chronic respiratory failure with hypoxia: Secondary | ICD-10-CM

## 2015-12-14 DIAGNOSIS — J479 Bronchiectasis, uncomplicated: Secondary | ICD-10-CM

## 2015-12-14 NOTE — Progress Notes (Signed)
Daily Session Note  Patient Details  Name: [name] MRN: [id number] Date of Birth: November 03, 1928 Referring Provider:   Flowsheet Row Pulmonary Rehab from 10/27/2015 in [facility] Cardiac and Pulmonary Rehab  Referring Provider  Simonne Maffucci MD      Encounter Date: 12/14/2015  Check In:     Session Check In - 12/14/15 1148      Check-In   Location [facility]-Cardiac & Pulmonary Rehab   Staff Present Carson Myrtle, BS, RRT, Respiratory Therapist;Kelly Amedeo Plenty, BS, ACSM CEP, Exercise Physiologist;Amanda Oletta Darter, BA, ACSM CEP, Exercise Physiologist   Supervising physician immediately available to respond to emergencies [facility] immediately available ER MD   Physician(s) Joni Fears and [name]   Medication changes reported     No   Fall or balance concerns reported    No   Warm-up and Cool-down Performed as group-led Location manager Performed Yes   VAD Patient? No     Pain Assessment   Currently in Pain? No/denies         Goals Met:  Proper associated with RPD/PD & O2 Sat Independence with exercise equipment Exercise tolerated well Strength training completed today  Goals Unmet:  Not Applicable  Comments: Pt able to follow exercise prescription today without complaint.  Will continue to monitor for progression.    Dr. Emily Filbert is Medical Director for [place] and [facility].

## 2015-12-14 NOTE — Progress Notes (Signed)
Pulmonary Individual Treatment Plan  Patient Details  Name: [name] MRN: [id number] Date of Birth: [birth year] Referring Provider:   Flowsheet Row Pulmonary Rehab from 10/27/2015 in [facility] Cardiac and Pulmonary Rehab  Referring Provider  Simonne Maffucci MD      Initial Encounter Date:  Flowsheet Row Pulmonary Rehab from 10/27/2015 in [facility] Cardiac and Pulmonary Rehab  Date  10/27/15  Referring Provider  Simonne Maffucci MD      Visit Diagnosis: COPD, mild ([place])  Bronchiectasis without complication ([place])  Chronic respiratory failure with hypoxia ([place])  Patient's Home Medications on Admission:  Current Outpatient Prescriptions:  .  acetaminophen (TYLENOL) 325 MG tablet, Take 650 mg by mouth every 6 (six) hours as needed., Disp: , Rfl:  .  albuterol (PROAIR HFA) 108 (90 Base) MCG/ACT inhaler, Inhale 2 puffs into the lungs every 4 (four) hours as needed for wheezing or shortness of breath., Disp: 1 Inhaler, Rfl: 2 .  Cyanocobalamin (VITAMIN B 12 PO), Take by mouth daily., Disp: , Rfl:  .  FIBER PO, Take 1 capsule by mouth 2 (two) times daily., Disp: , Rfl:  .  fluticasone (FLONASE) 50 MCG/ACT nasal spray, Place 2 sprays into both nostrils daily., Disp: , Rfl:  .  metoprolol tartrate (LOPRESSOR) 25 MG tablet, Take 0.5 tablets (12.5 mg total) by mouth as needed (for palpitations)., Disp: , Rfl:  .  Multiple Vitamin (MULTIVITAMIN) tablet, Take 1 tablet by mouth daily. Reported on 04/22/2015, Disp: , Rfl:  .  multivitamin-lutein (OCUVITE-LUTEIN) CAPS capsule, Take 1 capsule by mouth daily., Disp: , Rfl:  .  NON FORMULARY, Oxygen 2-5 liters 24/7, Disp: , Rfl:  .  omeprazole (PRILOSEC) 20 MG capsule, Take 20 mg by mouth daily.  , Disp: , Rfl:  .  Respiratory Therapy Supplies (FLUTTER) DEVI, Use as directed, Disp: 1 each, Rfl: 0 .  Rivaroxaban (XARELTO) 15 MG TABS tablet, Take 15 mg by mouth every morning., Disp: , Rfl:  .  tamsulosin (FLOMAX) 0.4 MG CAPS capsule, Take 2 capsules (0.8  mg total) by mouth daily., Disp: 180 capsule, Rfl: 3 .  tiotropium (SPIRIVA) 18 MCG inhalation capsule, Place 1 capsule (18 mcg total) into inhaler and inhale daily., Disp: 30 capsule, Rfl: 6 .  traMADol (ULTRAM) 50 MG tablet, TAKE 1 TABLET BY MOUTH 12 AS NEEDED FOR PAIN, Disp: 60 tablet, Rfl: 2 .  traZODone (DESYREL) 100 MG tablet, TAKE 2 TABLETS (200 MG TOTAL) BY MOUTH AT BEDTIME., Disp: 180 tablet, Rfl: 1  Past Medical History: Past Medical History:  Diagnosis Date  . AF (atrial fibrillation) ([place])    after PTX 2012  . Allergy   . Arthritis    h/o R shoulder injection per ortho  . Back pain   . BPH (benign prostatic hyperplasia)   . COPD (chronic obstructive pulmonary disease) ([place])   . Coronary artery disease   . Elevated MCV   . Erectile dysfunction   . GERD (gastroesophageal reflux disease)   . HSV-1 (herpes simplex virus 1) infection   . Hyperlipidemia   . Spontaneous pneumothorax 09/04/2010   [name]    Tobacco Use: History  Smoking Status  . Former Smoker  . Packs/day: 1.00  . Years: 50.00  . Types: Cigarettes  . Quit date: 04/14/1999  Smokeless Tobacco  . Never Used    Labs: Recent Review Flowsheet Data    Labs for ITP Cardiac and Pulmonary Rehab Latest Ref Rng & Units 06/27/2013 06/29/2013 05/19/2014 09/15/2014 11/26/2015   Cholestrol 0 -  200 mg/dL - - 188 - 199   LDLCALC 0 - 99 mg/dL - - 132(H) - 124(H)   LDLDIRECT mg/dL - - - - -   HDL >39.00 mg/dL - - 31.50(L) - 40.60   Trlycerides 0.0 - 149.0 mg/dL - - 124.0 - 172.0(H)   Hemoglobin A1c 4.6 - 6.5 % - - - 5.7 5.4   PHART 7.350 - 7.450 7.466(H) 7.420 - - -   PCO2ART 35.0 - 45.0 mmHg 33.0(L) 36.3 - - -   HCO3 20.0 - 24.0 mEq/L 23.6 23.1 - - -   TCO2 0 - 100 mmol/L 24.6 24.2 - - -   ACIDBASEDEF 0.0 - 2.0 mmol/L - 0.8 - - -   O2SAT % 97.0 93.9 - - -       ADL UCSD:     Pulmonary Assessment Scores    Row Name 10/27/15 1435         ADL UCSD   ADL Phase Entry     SOB Score total 77     Rest 0      Walk 3     Stairs 4     Bath 5     Dress 3     Shop 3        Pulmonary Function Assessment:     Pulmonary Function Assessment - 10/27/15 1434      Initial Spirometry Results   FVC% 102 %   FEV1% 79 %   FEV1/FVC Ratio 54   Comments Test done 09/11/15     Post Bronchodilator Spirometry Results   FVC% 103 %   FEV1% 86 %   FEV1/FVC Ratio 58     Breath   Bilateral Breath Sounds Clear;Decreased   Shortness of Breath Yes;Limiting activity;Fear of Shortness of Breath      Exercise Target Goals:    Exercise Program Goal: Individual exercise prescription set with THRR, safety & activity barriers. Participant demonstrates ability to understand and report RPE using BORG scale, to self-measure pulse accurately, and to acknowledge the importance of the exercise prescription.  Exercise Prescription Goal: Starting with aerobic activity 30 plus minutes a day, 3 days per week for initial exercise prescription. Provide home exercise prescription and guidelines that participant acknowledges understanding prior to discharge.  Activity Barriers & Risk Stratification:     Activity Barriers & Cardiac Risk Stratification - 10/27/15 1433      Activity Barriers & Cardiac Risk Stratification   Activity Barriers Shortness of Breath;Deconditioning;Joint Problems   Cardiac Risk Stratification Moderate      6 Minute Walk:     6 Minute Walk    Row Name 10/27/15 1438         6 Minute Walk   Phase Initial     Distance 220 feet     Walk Time 2.25 minutes     # of Rest Breaks 2  12 sec, 3:33     MPH 1.11     METS 1.84     RPE 13     Perceived Dyspnea  3     VO2 Peak 1.33     Symptoms No     Resting HR 81 bpm     Resting BP 134/64     Max Ex. HR 89 bpm     Max Ex. BP 136/74     2 Minute Post BP 136/64       Interval HR   Baseline HR 81     1 Minute HR 96  2 Minute HR 98     3 Minute HR 89     4 Minute HR 76     5 Minute HR 68     6 Minute HR 87     2 Minute Post HR  79     Interval Heart Rate? Yes       Interval Oxygen   Interval Oxygen? Yes     Baseline Oxygen Saturation % 93 %     Baseline Liters of Oxygen 5 L  pulsed     1 Minute Oxygen Saturation % 88 %  rest :58-1:10 SOB     1 Minute Liters of Oxygen 5 L     2 Minute Oxygen Saturation % 81 %  at 1:22 dropped to 84%     2 Minute Liters of Oxygen 5 L     3 Minute Oxygen Saturation % 83 %  began seated rest at 3 min     3 Minute Liters of Oxygen 5 L     4 Minute Oxygen Saturation % 86 %     4 Minute Liters of Oxygen 5 L     5 Minute Oxygen Saturation % 88 %  recoved to 90% at 4:55 walking resumed     5 Minute Liters of Oxygen 5 L     6 Minute Oxygen Saturation % 91 %     6 Minute Liters of Oxygen 5 L     2 Minute Post Oxygen Saturation % 91 %     2 Minute Post Liters of Oxygen 5 L        Initial Exercise Prescription:     Initial Exercise Prescription - 10/27/15 1400      Date of Initial Exercise RX and Referring Provider   Date 10/27/15   Referring Provider Simonne Maffucci MD     Oxygen   Oxygen Continuous   Liters 4  may need 6L on treadmill     Treadmill   MPH 0.5   Grade 0   Minutes 15   METs 1.4     NuStep   Level 1   Watts --  60-80 spm   Minutes 15  6mn x3   METs 1.5     Arm Ergometer   Level 1   Watts --  25-35 rpm   Minutes 15  5 min x3   METs 1.4     Prescription Details   Frequency (times per week) 3   Duration Progress to 45 minutes of aerobic exercise without signs/symptoms of physical distress     Intensity   THRR 40-80% of Max Heartrate 102-124   Ratings of Perceived Exertion 11-15   Perceived Dyspnea 0-4     Progression   Progression Continue to progress workloads to maintain intensity without signs/symptoms of physical distress.     Resistance Training   Training Prescription Yes   Weight 2 lbs   Reps 10-12      Perform Capillary Blood Glucose checks as needed.  Exercise Prescription Changes:     Exercise Prescription  Changes    Row Name 11/02/15 1500 11/11/15 1500 11/25/15 1500 12/08/15 1500       Exercise Review   Progression Yes Yes Yes Yes      Response to Exercise   Blood Pressure (Admit) 120/70 110/62 138/68 152/80    Blood Pressure (Exercise) 138/82 138/70 128/82 150/76    Blood Pressure (Exit) 132/70 126/64 138/80 150/82    Heart Rate (Admit) 79 bpm  78 bpm 76 bpm 70 bpm    Heart Rate (Exercise) 100 bpm 78 bpm 108 bpm 102 bpm    Heart Rate (Exit) 89 bpm 75 bpm 107 bpm 92 bpm    Oxygen Saturation (Admit) 92 % 92 % 92 % 94 %    Oxygen Saturation (Exercise) 88 % 94 % 89 % 90 %    Oxygen Saturation (Exit) 98 % 99 % 90 % 98 %    Rating of Perceived Exertion (Exercise) _0 Perceived Dyspnea (Exercise) _1 Symptoms none none none none    Duration Progress to 45 minutes of aerobic exercise without signs/symptoms of physical distress Progress to 45 minutes of aerobic exercise without signs/symptoms of physical distress Progress to 45 minutes of aerobic exercise without signs/symptoms of physical distress Progress to 45 minutes of aerobic exercise without signs/symptoms of physical distress    Intensity THRR unchanged THRR unchanged THRR unchanged THRR unchanged      Progression   Progression Continue to progress workloads to maintain intensity without signs/symptoms of physical distress. Continue to progress workloads to maintain intensity without signs/symptoms of physical distress. Continue to progress workloads to maintain intensity without signs/symptoms of physical distress. Continue to progress workloads to maintain intensity without signs/symptoms of physical distress.    Average METs  - 1.4 1.76 1.63      Resistance Training   Training Prescription Yes Yes Yes Yes    Weight 2 2 lbs 2 lbs 6 lbs    Reps 10-12 10-12 10-12 10-12      Interval Training   Interval Training No No No No      Oxygen   Oxygen Continuous Continuous Continuous Continuous    Liters  - 4-6 4-6  6L  on treadmill 4-6  6L on treadmill      Treadmill   MPH 0.8 0.8 1 0.8    Grade 0 0 0 0    Minutes 15  2/2/2/2/2 7  47mn, 5 min  6 13    METs  - 1.6 1.77 1.6      NuStep   Level _2 Watts  - -  63 spm -  60 spm -  65 spm    Minutes _3 METs  - 1.6 1.6 1.6      Arm Ergometer   Level _4 Watts  -  - -  32 rpm -  45 rpm    Minutes 7.5 7._5 METs  - 1 1.9 1.6       Exercise Comments:     Exercise Comments    Row Name 10/27/15 1448 11/04/15 1356 11/11/15 1536 11/25/15 1540 12/08/15 1551   Exercise Comments Exercise goal is to be able to walk longer without getting SOB First full day of exercise was on Monday!  Patient was oriented to gym and equipment including functions, settings, policies, and procedures.  Patient's individual exercise prescription and treatment plan were reviewed.  All starting workloads were established based on the results of the 6 minute walk test done at initial orientation visit.  The plan for exercise progression was also introduced and progression will be customized based on patient's performance and goals. [name] is off to a good start with exercise.   He is doing 0.8 mph on treadmill for 2 min intervals for 8 min  total!  We will continue to monitor his progression. [name] continues to do well with exercise.  He is starting to become more independent.  We will hold off on home exercise just a little bit longer unitl he is ready.  We will continue to monitor his progression. [name] is getting more independent with exercise.  He may be ready for home exercise soon.  We will continue to monitor his progression.      Discharge Exercise Prescription (Final Exercise Prescription Changes):     Exercise Prescription Changes - 12/08/15 1500      Exercise Review   Progression Yes     Response to Exercise   Blood Pressure (Admit) 152/80   Blood Pressure (Exercise) 150/76   Blood Pressure (Exit) 150/82   Heart Rate (Admit)  70 bpm   Heart Rate (Exercise) 102 bpm   Heart Rate (Exit) 92 bpm   Oxygen Saturation (Admit) 94 %   Oxygen Saturation (Exercise) 90 %   Oxygen Saturation (Exit) 98 %   Rating of Perceived Exertion (Exercise) 12   Perceived Dyspnea (Exercise) 4   Symptoms none   Duration Progress to 45 minutes of aerobic exercise without signs/symptoms of physical distress   Intensity THRR unchanged     Progression   Progression Continue to progress workloads to maintain intensity without signs/symptoms of physical distress.   Average METs 1.63     Resistance Training   Training Prescription Yes   Weight 6 lbs   Reps 10-12     Interval Training   Interval Training No     Oxygen   Oxygen Continuous   Liters 4-6  6L on treadmill     Treadmill   MPH 0.8   Grade 0   Minutes 13   METs 1.6     NuStep   Level 5   Watts --  65 spm   Minutes 15   METs 1.6     Arm Ergometer   Level 1   Watts --  45 rpm   Minutes 4   METs 1.6       Nutrition:  Target Goals: Understanding of nutrition guidelines, daily intake of sodium <1517m, cholesterol <2020m calories 30% from fat and 7% or less from saturated fats, daily to have 5 or more servings of fruits and vegetables.  Biometrics:     Pre Biometrics - 10/27/15 1448      Pre Biometrics   Height 5' 8.5" (1.74 m)   Weight 168 lb 6.4 oz (76.4 kg)   Waist Circumference 38.5 inches   Hip Circumference 40.5 inches   Waist to Hip Ratio 0.95 %   BMI (Calculated) 25.3       Nutrition Therapy Plan and Nutrition Goals:   Nutrition Discharge: Rate Your Plate Scores:   Psychosocial: Target Goals: Acknowledge presence or absence of depression, maximize coping skills, provide positive support system. Participant is able to verbalize types and ability to use techniques and skills needed for reducing stress and depression.  Initial Review & Psychosocial Screening:     Initial Psych Review & Screening - 10/27/15 [place]   Comments Mr HuGeiltates he does have times he misses certain activities, such as golf, that he can no longer perform due to his shortness of breath. He has great support from his wife   .     Barriers   Psychosocial barriers to participate in program There are  no identifiable barriers or psychosocial needs.;The patient should benefit from training in stress management and relaxation.     Screening Interventions   Interventions Encouraged to exercise;Program counselor consult      Quality of Life Scores:     Quality of Life - 10/27/15 1447      Quality of Life Scores   Health/Function Pre 20.69 %   Socioeconomic Pre 20.29 %   Psych/Spiritual Pre 21 %   Family Pre 21 %   GLOBAL Pre 20.71 %      PHQ-9: Recent Review Flowsheet Data    Depression screen [facility] 2/9 11/26/2015 10/27/2015 07/25/2014 05/22/2014 06/01/2012   Decreased Interest 0 0 0 0 0   Down, Depressed, Hopeless 0 0 0 0 0   PHQ - 2 Score 0 0 0 0 0   Altered sleeping - 0 - - -   Tired, decreased energy - 1 - - -   Change in appetite - 0 - - -   Feeling bad or failure about yourself  - 0 - - -   Trouble concentrating - 0 - - -   Moving slowly or fidgety/restless - 0 - - -   Suicidal thoughts - 0 - - -   PHQ-9 Score - 1 - - -   Difficult doing work/chores - Not difficult at all - - -      Psychosocial Evaluation and Intervention:     Psychosocial Evaluation - 11/09/15 1235      Psychosocial Evaluation & Interventions   Interventions Encouraged to exercise with the program and follow exercise prescription;Stress management education;Relaxation education   Comments Counselor met with Mr. Gentle (Mr. Lemmie Evens) today for initial psychosocial evaluation.  He is an 82 year old gentleman who has been married 34 years and has a strong support system with adult children and active involvement in his local church community.  Mr. Lemmie Evens states he sleeps well (with occasional Tylenol at bedtime)  and he has a good appetite.  He denies a history or current symptoms of depression or anxiety and states he is typically in a positive mood.  He has minimal stress in his life although he "worries" some about the health of his wife as she "works too hard taking care of me."  Mr. Lemmie Evens has goals to increase his stamin and strength and to breathe better while in this program.        Psychosocial Re-Evaluation:     Psychosocial Re-Evaluation    Row Name 12/08/15 1231             Psychosocial Re-Evaluation   Comments See 30 note 12/08/15         Education: Education Goals: Education classes will be provided on a weekly basis, covering required topics. Participant will state understanding/return demonstration of topics presented.  Learning Barriers/Preferences:     Learning Barriers/Preferences - 10/27/15 1434      Learning Barriers/Preferences   Learning Barriers None   Learning Preferences None      Education Topics: Initial Evaluation Education: - Verbal, written and demonstration of respiratory meds, RPE/PD scales, oximetry and breathing techniques. Instruction on use of nebulizers and MDIs: cleaning and proper use, rinsing mouth with steroid doses and importance of monitoring MDI activations. Flowsheet Row Pulmonary Rehab from 12/14/2015 in [facility] Cardiac and Pulmonary Rehab  Date  10/27/15  Educator  LB  Instruction Review Code  2- meets goals/outcomes      General Nutrition Guidelines/Fats and Fiber: -Group instruction provided by  verbal, written material, models and posters to present the general guidelines for heart healthy nutrition. Gives an explanation and review of dietary fats and fiber. Flowsheet Row Pulmonary Rehab from 12/14/2015 in [facility] Cardiac and Pulmonary Rehab  Date  12/07/15  Educator  CR  Instruction Review Code  2- meets goals/outcomes      Controlling Sodium/Reading Food Labels: -Group verbal and written material supporting the discussion of sodium  use in heart healthy nutrition. Review and explanation with models, verbal and written materials for utilization of the food label. Flowsheet Row Pulmonary Rehab from 12/14/2015 in [facility] Cardiac and Pulmonary Rehab  Date  12/14/15  Educator  CR  Instruction Review Code  2- meets goals/outcomes      Exercise Physiology & Risk Factors: - Group verbal and written instruction with models to review the exercise physiology of the cardiovascular system and associated critical values. Details cardiovascular disease risk factors and the goals associated with each risk factor. Flowsheet Row Pulmonary Rehab from 12/14/2015 in [facility] Cardiac and Pulmonary Rehab  Date  12/02/15  Educator  AS  Instruction Review Code  2- meets goals/outcomes      Aerobic Exercise & Resistance Training: - Gives group verbal and written discussion on the health impact of inactivity. On the components of aerobic and resistive training programs and the benefits of this training and how to safely progress through these programs.   Flexibility, Balance, General Exercise Guidelines: - Provides group verbal and written instruction on the benefits of flexibility and balance training programs. Provides general exercise guidelines with specific guidelines to those with heart or lung disease. Demonstration and skill practice provided.   Stress Management: - Provides group verbal and written instruction about the health risks of elevated stress, cause of high stress, and healthy ways to reduce stress.   Depression: - Provides group verbal and written instruction on the correlation between heart/lung disease and depressed mood, treatment options, and the stigmas associated with seeking treatment.   Exercise & Equipment Safety: - Individual verbal instruction and demonstration of equipment use and safety with use of the equipment.   Infection Prevention: - Provides verbal and written material to individual with discussion of  infection control including proper hand washing and proper equipment cleaning during exercise session. Flowsheet Row Pulmonary Rehab from 12/14/2015 in [facility] Cardiac and Pulmonary Rehab  Date  10/27/15  Educator  LB  Instruction Review Code  2- meets goals/outcomes      Falls Prevention: - Provides verbal and written material to individual with discussion of falls prevention and safety. Flowsheet Row Pulmonary Rehab from 12/14/2015 in [facility] Cardiac and Pulmonary Rehab  Date  10/27/15  Educator  LB  Instruction Review Code  2- meets goals/outcomes      Diabetes: - Individual verbal and written instruction to review signs/symptoms of diabetes, desired ranges of glucose level fasting, after meals and with exercise. Advice that pre and post exercise glucose checks will be done for 3 sessions at entry of program.   Chronic Lung Diseases: - Group verbal and written instruction to review new updates, new respiratory medications, new advancements in procedures and treatments. Provide informative websites and "800" numbers of self-education. Flowsheet Row Pulmonary Rehab from 12/14/2015 in [facility] Cardiac and Pulmonary Rehab  Date  11/02/15  Educator  LB  Instruction Review Code  2- meets goals/outcomes      Lung Procedures: - Group verbal and written instruction to describe testing methods done to diagnose lung disease. Review the outcome of test results.  Describe the treatment choices: Pulmonary Function Tests, ABGs and oximetry.   Energy Conservation: - Provide group verbal and written instruction for methods to conserve energy, plan and organize activities. Instruct on pacing techniques, use of adaptive equipment and posture/positioning to relieve shortness of breath.   Triggers: - Group verbal and written instruction to review types of environmental controls: home humidity, furnaces, filters, dust mite/pet prevention, HEPA vacuums. To discuss weather changes, air quality and the  benefits of nasal washing.   Exacerbations: - Group verbal and written instruction to provide: warning signs, infection symptoms, calling MD promptly, preventive modes, and value of vaccinations. Review: effective airway clearance, coughing and/or vibration techniques. Create an Sports administrator.   Oxygen: - Individual and group verbal and written instruction on oxygen therapy. Includes supplement oxygen, available portable oxygen systems, continuous and intermittent flow rates, oxygen safety, concentrators, and Medicare reimbursement for oxygen.   Respiratory Medications: - Group verbal and written instruction to review medications for lung disease. Drug class, frequency, complications, importance of spacers, rinsing mouth after steroid MDI's, and proper cleaning methods for nebulizers. Flowsheet Row Pulmonary Rehab from 12/14/2015 in [facility] Cardiac and Pulmonary Rehab  Date  10/27/15  Educator  LB  Instruction Review Code  2- meets goals/outcomes      AED/CPR: - Group verbal and written instruction with the use of models to demonstrate the basic use of the AED with the basic ABC's of resuscitation.   Breathing Retraining: - Provides individuals verbal and written instruction on purpose, frequency, and proper technique of diaphragmatic breathing and pursed-lipped breathing. Applies individual practice skills. Flowsheet Row Pulmonary Rehab from 12/14/2015 in [facility] Cardiac and Pulmonary Rehab  Date  10/27/15  Educator  LB  Instruction Review Code  2- meets goals/outcomes      Anatomy and Physiology of the Lungs: - Group verbal and written instruction with the use of models to provide basic lung anatomy and physiology related to function, structure and complications of lung disease.   Heart Failure: - Group verbal and written instruction on the basics of heart failure: signs/symptoms, treatments, explanation of ejection fraction, enlarged heart and cardiomyopathy.   Sleep Apnea: -  Individual verbal and written instruction to review Obstructive Sleep Apnea. Review of risk factors, methods for diagnosing and types of masks and machines for OSA.   Anxiety: - Provides group, verbal and written instruction on the correlation between heart/lung disease and anxiety, treatment options, and management of anxiety.   Relaxation: - Provides group, verbal and written instruction about the benefits of relaxation for patients with heart/lung disease. Also provides patients with examples of relaxation techniques. Flowsheet Row Pulmonary Rehab from 12/14/2015 in [facility] Cardiac and Pulmonary Rehab  Date  11/18/15  Educator  [employer]  Instruction Review Code  2- Meets goals/outcomes      Knowledge Questionnaire Score:     Knowledge Questionnaire Score - 10/27/15 1434      Knowledge Questionnaire Score   Pre Score 4/10       Core Components/Risk Factors/Patient Goals at Admission:     Personal Goals and Risk Factors at Admission - 10/27/15 1440      Core Components/Risk Factors/Patient Goals on Admission   Sedentary Yes  Walks driveway at home 5 days/week for 16mns   Intervention Provide advice, education, support and counseling about physical activity/exercise needs.;Develop an individualized exercise prescription for aerobic and resistive training based on initial evaluation findings, risk stratification, comorbidities and participant's personal goals.   Expected Outcomes Achievement of increased cardiorespiratory fitness and  enhanced flexibility, muscular endurance and strength shown through measurements of functional capacity and personal statement of participant.   Increase Strength and Stamina Yes   Intervention Provide advice, education, support and counseling about physical activity/exercise needs.;Develop an individualized exercise prescription for aerobic and resistive training based on initial evaluation findings, risk stratification, comorbidities and participant's  personal goals.   Expected Outcomes Achievement of increased cardiorespiratory fitness and enhanced flexibility, muscular endurance and strength shown through measurements of functional capacity and personal statement of participant.   Improve shortness of breath with ADL's Yes   Intervention Provide education, individualized exercise plan and daily activity instruction to help decrease symptoms of SOB with activities of daily living.   Expected Outcomes Short Term: Achieves a reduction of symptoms when performing activities of daily living.   Develop more efficient breathing techniques such as purse lipped breathing and diaphragmatic breathing; and practicing self-pacing with activity Yes  Uses PLB; was instructed at [facility]   Intervention Provide education, demonstration and support about specific breathing techniuqes utilized for more efficient breathing. Include techniques such as pursed lipped breathing, diaphragmatic breathing and self-pacing activity.   Expected Outcomes Short Term: Participant will be able to demonstrate and use breathing techniques as needed throughout daily activities.   Increase knowledge of respiratory medications and ability to use respiratory devices properly  Yes  Albuterol MDI; Spacer given; oxygen 2-5l/m   Intervention Provide education and demonstration as needed of appropriate use of medications, inhalers, and oxygen therapy.   Expected Outcomes Short Term: Achieves understanding of medications use. Understands that oxygen is a medication prescribed by physician. Demonstrates appropriate use of inhaler and oxygen therapy.   Lipids Yes   Intervention Provide education and support for participant on nutrition & aerobic/resistive exercise along with prescribed medications to achieve LDL <28m, HDL >440m   Expected Outcomes Short Term: Participant states understanding of desired cholesterol values and is compliant with medications prescribed. Participant is  following exercise prescription and nutrition guidelines.;Long Term: Cholesterol controlled with medications as prescribed, with individualized exercise RX and with personalized nutrition plan. Value goals: LDL < 7053mHDL > 40 mg.      Core Components/Risk Factors/Patient Goals Review:      Goals and Risk Factor Review    Row Name 11/23/15 1439 12/08/15 0640           Core Components/Risk Factors/Patient Goals Review   Personal Goals Review Sedentary;Increase Strength and Stamina;Improve shortness of breath with ADL's;Develop more efficient breathing techniques such as purse lipped breathing and diaphragmatic breathing and practicing self-pacing with activity.;Increase knowledge of respiratory medications and ability to use respiratory devices properly. Sedentary;Increase Strength and Stamina;Improve shortness of breath with ADL's;Increase knowledge of respiratory medications and ability to use respiratory devices properly.      Review Mr HunShirers completed 3 weeks in [facility] and states an improvement with his strength. He has recently started on Mucinex which has improved his sputum production. He has a good understanding of his Albuterol MDI and carries it with him. The spacer he uses at home. His PD's for shortness of breath are 3-4 and does not notice improvement in his breathing, although he does use PLB and adjusts his oxygen to 5l/m at home with activity. Mr Wilber walks at home his driveway with pacing and use of his home oximeter. Unfortunately he uses intermittent flow with the oxygen, so  his O2Sat's do drop in the 80's - encouraged him to rest and pace himself to keep O2Sat's 88%  or greater. Mr Vallone is still walking his driveway on his days off from [place], although with the cold weather, he has been walking at [facility]. He recently purchased new sneakers for better support for exercise. His breathing has worsened over the last couple of weeks. He physician has ordered  an EKG.  Mr Frieson has had a productive cough, but the sputum has been clear. He does use a flutter valve.       Expected Outcomes  - Continue exercising to increase his strength and help improve shortness of breath. Contact oxygen company for a larger portable tank.         Core Components/Risk Factors/Patient Goals at Discharge (Final Review):      Goals and Risk Factor Review - 12/08/15 0640      Core Components/Risk Factors/Patient Goals Review   Personal Goals Review Sedentary;Increase Strength and Stamina;Improve shortness of breath with ADL's;Increase knowledge of respiratory medications and ability to use respiratory devices properly.   Review Mr Geibel is still walking his driveway on his days off from [place], although with the cold weather, he has been walking at [facility]. He recently purchased new sneakers for better support for exercise. His breathing has worsened over the last couple of weeks. He physician has ordered an EKG.  Mr Allston has had a productive cough, but the sputum has been clear. He does use a flutter valve.    Expected Outcomes Continue exercising to increase his strength and help improve shortness of breath. Contact oxygen company for a larger portable tank.      ITP Comments:   Comments: 30 Day Review

## 2015-12-16 ENCOUNTER — Encounter: Payer: Medicare Other | Admitting: *Deleted

## 2015-12-16 DIAGNOSIS — J9611 Chronic respiratory failure with hypoxia: Secondary | ICD-10-CM | POA: Diagnosis not present

## 2015-12-16 DIAGNOSIS — J479 Bronchiectasis, uncomplicated: Secondary | ICD-10-CM

## 2015-12-16 DIAGNOSIS — J449 Chronic obstructive pulmonary disease, unspecified: Secondary | ICD-10-CM

## 2015-12-16 DIAGNOSIS — J439 Emphysema, unspecified: Secondary | ICD-10-CM | POA: Diagnosis not present

## 2015-12-16 NOTE — Progress Notes (Signed)
Daily Session Note  Patient Details  Name: [name] MRN: [id number] Date of Birth: 1928/08/09 Referring Provider:   Flowsheet Row Pulmonary Rehab from 10/27/2015 in [facility] Cardiac and Pulmonary Rehab  Referring Provider  Simonne Maffucci MD      Encounter Date: 12/16/2015  Check In:     Session Check In - 12/16/15 1159      Check-In   Location [facility]-Cardiac & Pulmonary Rehab   Staff Present Carson Myrtle, BS, RRT, Respiratory Lennie Hummer, MA, ACSM RCEP, Exercise Physiologist   Supervising physician immediately available to respond to emergencies [facility] immediately available ER MD   Physician(s) Drs. Jimmye Tico and Lord   Medication changes reported     No   Fall or balance concerns reported    No   Warm-up and Cool-down Performed as group-led Location manager Performed Yes   VAD Patient? No     Pain Assessment   Currently in Pain? No/denies   Multiple Pain Sites No         Goals Met:  Proper associated with RPD/PD & O2 Sat Independence with exercise equipment Using PLB without cueing & demonstrates good technique Exercise tolerated well No report of cardiac concerns or symptoms Strength training completed today  Goals Unmet:  Not Applicable  Comments: Pt able to follow exercise prescription today without complaint.  Will continue to monitor for progression.    Dr. Emily Filbert is Medical Director for [place] and [facility].

## 2015-12-17 ENCOUNTER — Ambulatory Visit (HOSPITAL_COMMUNITY): Payer: Medicare Other | Attending: Internal Medicine

## 2015-12-17 ENCOUNTER — Other Ambulatory Visit: Payer: Self-pay

## 2015-12-17 DIAGNOSIS — I5189 Other ill-defined heart diseases: Secondary | ICD-10-CM | POA: Diagnosis not present

## 2015-12-17 DIAGNOSIS — R0602 Shortness of breath: Secondary | ICD-10-CM | POA: Insufficient documentation

## 2015-12-17 DIAGNOSIS — I340 Nonrheumatic mitral (valve) insufficiency: Secondary | ICD-10-CM | POA: Diagnosis not present

## 2015-12-18 ENCOUNTER — Encounter: Payer: Medicare Other | Attending: Pulmonary Disease

## 2015-12-18 DIAGNOSIS — J9611 Chronic respiratory failure with hypoxia: Secondary | ICD-10-CM | POA: Insufficient documentation

## 2015-12-18 DIAGNOSIS — J439 Emphysema, unspecified: Secondary | ICD-10-CM | POA: Insufficient documentation

## 2015-12-21 DIAGNOSIS — J9611 Chronic respiratory failure with hypoxia: Secondary | ICD-10-CM

## 2015-12-21 DIAGNOSIS — J439 Emphysema, unspecified: Secondary | ICD-10-CM | POA: Diagnosis not present

## 2015-12-21 DIAGNOSIS — J449 Chronic obstructive pulmonary disease, unspecified: Secondary | ICD-10-CM

## 2015-12-21 DIAGNOSIS — J479 Bronchiectasis, uncomplicated: Secondary | ICD-10-CM

## 2015-12-21 NOTE — Progress Notes (Signed)
Daily Session Note  Patient Details  Name: [name] MRN: [id number] Date of Birth: 1928-06-19 Referring Provider:   Flowsheet Row Pulmonary Rehab from 10/27/2015 in [facility] Cardiac and Pulmonary Rehab  Referring Provider  Simonne Maffucci MD      Encounter Date: 12/21/2015  Check In:     Session Check In - 12/21/15 1457      Check-In   Location [facility]-Cardiac & Pulmonary Rehab   Staff Present Carson Myrtle, BS, RRT, Respiratory Therapist;Kelly Amedeo Plenty, BS, ACSM CEP, Exercise Physiologist;Holleigh Crihfield Oletta Darter, BA, ACSM CEP, Exercise Physiologist   Supervising physician immediately available to respond to emergencies [facility] immediately available ER MD   Physician(s) Jimmye Jadon and [name]   Medication changes reported     No   Fall or balance concerns reported    No   Warm-up and Cool-down Performed as group-led Location manager Performed Yes   VAD Patient? No     Pain Assessment   Currently in Pain? No/denies         Goals Met:  Proper associated with RPD/PD & O2 Sat Independence with exercise equipment Exercise tolerated well Strength training completed today  Goals Unmet:  Not Applicable  Comments: Pt able to follow exercise prescription today without complaint.  Will continue to monitor for progression.    Dr. Emily Filbert is Medical Director for [place] and [facility].

## 2015-12-23 ENCOUNTER — Encounter: Payer: Medicare Other | Admitting: *Deleted

## 2015-12-23 DIAGNOSIS — J9611 Chronic respiratory failure with hypoxia: Secondary | ICD-10-CM | POA: Diagnosis not present

## 2015-12-23 DIAGNOSIS — J439 Emphysema, unspecified: Secondary | ICD-10-CM | POA: Diagnosis not present

## 2015-12-23 DIAGNOSIS — J479 Bronchiectasis, uncomplicated: Secondary | ICD-10-CM

## 2015-12-23 DIAGNOSIS — J449 Chronic obstructive pulmonary disease, unspecified: Secondary | ICD-10-CM

## 2015-12-23 NOTE — Progress Notes (Signed)
Daily Session Note  Patient Details  Name: [name] MRN: [id number] Date of Birth: [birth year] Referring Provider:   Flowsheet Row Pulmonary Rehab from 10/27/2015 in [facility] Cardiac and Pulmonary Rehab  Referring Provider  Simonne Maffucci MD      Encounter Date: 12/23/2015  Check In:     Session Check In - 12/23/15 1144      Check-In   Location [facility]-Cardiac & Pulmonary Rehab   Staff Present Alberteen Sam, MA, ACSM RCEP, Exercise Physiologist;Laureen Owens Shark, BS, RRT, Respiratory Therapist;Other   Supervising physician immediately available to respond to emergencies [facility] immediately available ER MD   Physician(s) Drs. McShane and Schaevitz   Medication changes reported     No   Fall or balance concerns reported    No   Warm-up and Cool-down Performed as group-led Location manager Performed Yes   VAD Patient? No     Pain Assessment   Currently in Pain? No/denies   Multiple Pain Sites No           Exercise Prescription Changes - 12/22/15 1500      Exercise Review   Progression Yes     Response to Exercise   Blood Pressure (Admit) 142/70   Blood Pressure (Exercise) 136/78   Blood Pressure (Exit) 134/82   Heart Rate (Admit) 86 bpm   Heart Rate (Exercise) 125 bpm   Heart Rate (Exit) 90 bpm   Oxygen Saturation (Admit) 91 %   Oxygen Saturation (Exercise) 91 %   Oxygen Saturation (Exit) 97 %   Rating of Perceived Exertion (Exercise) 13   Perceived Dyspnea (Exercise) 3   Symptoms none   Duration Progress to 45 minutes of aerobic exercise without signs/symptoms of physical distress   Intensity THRR unchanged     Progression   Progression Continue to progress workloads to maintain intensity without signs/symptoms of physical distress.   Average METs 1.75     Resistance Training   Training Prescription Yes   Weight 3 lbs   Reps 10-12     Interval Training   Interval Training No     Oxygen   Oxygen Continuous   Liters 4-6  6L on  treadmill     Treadmill   MPH 0.8   Grade 0   Minutes 13   METs 1.6     NuStep   Level 5   Watts --  61 spm   Minutes 15   METs 1.8     Arm Ergometer   Level 1   Minutes 4   METs 1.6      Goals Met:  Proper associated with RPD/PD & O2 Sat Independence with exercise equipment Using PLB without cueing & demonstrates good technique Exercise tolerated well No report of cardiac concerns or symptoms Strength training completed today  Goals Unmet:  Not Applicable  Comments: Pt able to follow exercise prescription today without complaint.  Will continue to monitor for progression.      [place] Name 10/27/15 1438 12/23/15 1318       6 Minute Walk   Phase Initial Mid Program    Distance 220 feet 460 feet    Walk Time 2.25 minutes 4.27 minutes    # of Rest Breaks 2  12 sec, 3:33 5  20 sec, 10 sec, 24 sec, 20 sec, 30 sec    MPH 1.11 1.22    METS 1.84 1.9    RPE 13 13    Perceived  Dyspnea  3 4    VO2 Peak 1.33 3.36    Symptoms No Yes (comment)    Comments  - SOB    Resting HR 81 bpm 88 bpm    Resting BP 134/64 126/70    Max Ex. HR 89 bpm 113 bpm    Max Ex. BP 136/74 134/66    2 Minute Post BP 136/64 126/64      Interval HR   Baseline HR 81 88    1 Minute HR 96 115    2 Minute HR 98 107    3 Minute HR 89 112    4 Minute HR 76 88    5 Minute HR 68 106    6 Minute HR 87 106    2 Minute Post HR 79 75    Interval Heart Rate? Yes Yes      Interval Oxygen   Interval Oxygen? Yes Yes    Baseline Oxygen Saturation % 93 % 93 %    Baseline Liters of Oxygen 5 L  pulsed 5 L  pulsed    1 Minute Oxygen Saturation % 88 %  rest :58-1:10 SOB 85 %  84% 1:03-1:23    1 Minute Liters of Oxygen 5 L 6 L  continuous    2 Minute Oxygen Saturation % 81 %  at 1:22 dropped to 84% 89 %  rest 2:18-228, 84% 2:51-3:15    2 Minute Liters of Oxygen 5 L 6 L    3 Minute Oxygen Saturation % 83 %  began seated rest at 3 min 85 %    3 Minute Liters of Oxygen 5 L 8 L     4 Minute Oxygen Saturation % 86 % 92 %  82% 3:38-3:58    4 Minute Liters of Oxygen 5 L 8 L    5 Minute Oxygen Saturation % 88 %  recoved to 90% at 4:55 walking resumed 84 %  84% 5:00-5:30    5 Minute Liters of Oxygen 5 L 8 L    6 Minute Oxygen Saturation % 91 % 93 %    6 Minute Liters of Oxygen 5 L 8 L    2 Minute Post Oxygen Saturation % 91 % 92 %    2 Minute Post Liters of Oxygen 5 L 8 L       Dr. Emily Filbert is Medical Director for [place] and [facility].

## 2015-12-24 ENCOUNTER — Encounter: Payer: Self-pay | Admitting: Family Medicine

## 2015-12-24 ENCOUNTER — Ambulatory Visit (INDEPENDENT_AMBULATORY_CARE_PROVIDER_SITE_OTHER): Payer: Medicare Other | Admitting: Family Medicine

## 2015-12-24 VITALS — BP 144/76 | HR 78 | Temp 97.5°F | Ht 69.0 in | Wt 167.5 lb

## 2015-12-24 DIAGNOSIS — I480 Paroxysmal atrial fibrillation: Secondary | ICD-10-CM | POA: Diagnosis not present

## 2015-12-24 DIAGNOSIS — G4700 Insomnia, unspecified: Secondary | ICD-10-CM

## 2015-12-24 DIAGNOSIS — Y92099 Unspecified place in other non-institutional residence as the place of occurrence of the external cause: Secondary | ICD-10-CM

## 2015-12-24 DIAGNOSIS — W19XXXS Unspecified fall, sequela: Secondary | ICD-10-CM | POA: Diagnosis not present

## 2015-12-24 DIAGNOSIS — J479 Bronchiectasis, uncomplicated: Secondary | ICD-10-CM

## 2015-12-24 DIAGNOSIS — Y92009 Unspecified place in unspecified non-institutional (private) residence as the place of occurrence of the external cause: Secondary | ICD-10-CM

## 2015-12-24 DIAGNOSIS — L97911 Non-pressure chronic ulcer of unspecified part of right lower leg limited to breakdown of skin: Secondary | ICD-10-CM

## 2015-12-24 MED ORDER — TRAZODONE HCL 100 MG PO TABS: ORAL_TABLET | ORAL | 3 refills | Status: AC

## 2015-12-24 MED ORDER — TAMSULOSIN HCL 0.4 MG PO CAPS: 0.8000 mg | ORAL_CAPSULE | Freq: Every day | ORAL | 3 refills | Status: AC

## 2015-12-24 NOTE — Progress Notes (Signed)
Abnormal screenings:  Hearing - failed, d/w pt.   Fall risk - hx of fall with injury.  D/w pt.  Still using a cane some of the time due to L hip pain after a fall in the past. He was considering injections per ortho but couldn't tolerate it (due to positioning) due to his pulmonary status.  D/w pt about fall cautions in general.  Last episode he had missed a step, going down the steps.   Insomnia.  Manageable with trazodone.  No ADE on med.    Prev has seen CVTS re: venous stasis ulcer.  No ulcers currently.  Still in compression stockings.    BPH.  occ getting up at night to urinate, no ADE on med. Stream is "okay."    COPD.  Still on 4L O2 at baseline, at rest.  He is still in cardiac/pulmonary rehab.  He is able to walk farther than prev, compared to when he started.  [facility]  Compliant with inhalers.  No ADE on meds.  He has therapy vest pending.  He has been using flutter valve.    AF. Still on anticoagulation.  EF preserved.  Echo d/w pt.  Has PRN metoprolol,not needed recently.  Some occ bruising but no bleeding.    PMH and SH reviewed  ROS: Per HPI unless specifically indicated in ROS section   Meds, vitals, and allergies reviewed.   GEN: nad, alert and oriented, on O2.  HEENT: mucous membranes moist NECK: supple w/o LA CV: rrr except occ skipped beat noted on exam today but he doesn't feel them.   PULM: ctab, no inc wob ABD: soft, +bs EXT: no edema SKIN: no acute rash but R medial shin with postinflammatory changes.

## 2015-12-24 NOTE — Patient Instructions (Addendum)
Call about cardiology follow up when possible.   Don't change your meds for now.  Take care.  Glad to see you.  Update me as needed.

## 2015-12-24 NOTE — Progress Notes (Signed)
Pre visit review using our clinic review tool, if applicable. No additional management support is needed unless otherwise documented below in the visit note.

## 2015-12-25 NOTE — Assessment & Plan Note (Signed)
Reasonably controlled with trazodone. No adverse effect. Continue as is.

## 2015-12-25 NOTE — Assessment & Plan Note (Addendum)
Still anticoagulated. Ejection fraction preserved. Echo discussed with patient. Not needing metoprolol frequently or recently. Able to tolerate current meds. Continue as is. I will defer statin for now- unless cardiology thinks that we should be more aggressive with his lipids. Lipids discussed with patient. See after visit summary. >25 minutes spent in face to face time with patient, >50% spent in counselling or coordination of care.

## 2015-12-25 NOTE — Assessment & Plan Note (Addendum)
H/o, no active ulcer this point. He has some postinflammatory hyperpigmentation noted. Continue compression stockings.

## 2015-12-25 NOTE — Assessment & Plan Note (Signed)
Per pulmonary. Continue. Continue cardiopulmonary rehabilitation exercises.

## 2015-12-25 NOTE — Assessment & Plan Note (Signed)
No recent falls now. Discussed with him about fall cautions in general. He is using tramadol as needed for left hip pain.

## 2015-12-28 DIAGNOSIS — J9611 Chronic respiratory failure with hypoxia: Secondary | ICD-10-CM

## 2015-12-28 DIAGNOSIS — J439 Emphysema, unspecified: Secondary | ICD-10-CM | POA: Diagnosis not present

## 2015-12-28 DIAGNOSIS — J449 Chronic obstructive pulmonary disease, unspecified: Secondary | ICD-10-CM

## 2015-12-28 DIAGNOSIS — J479 Bronchiectasis, uncomplicated: Secondary | ICD-10-CM

## 2015-12-28 NOTE — Progress Notes (Signed)
Daily Session Note  Patient Details  Name: [name] MRN: [id number] Date of Birth: 07-Jun-1928 Referring Provider:   Flowsheet Row Pulmonary Rehab from 10/27/2015 in [facility] Cardiac and Pulmonary Rehab  Referring Provider  Simonne Maffucci MD      Encounter Date: 12/28/2015  Check In:     Session Check In - 12/28/15 1308      Check-In   Location [facility]-Cardiac & Pulmonary Rehab   Staff Present Carson Myrtle, BS, RRT, Respiratory Therapist;Kelly Amedeo Plenty, BS, ACSM CEP, Exercise Physiologist;Draven Natter Oletta Darter, BA, ACSM CEP, Exercise Physiologist   Supervising physician immediately available to respond to emergencies [facility] immediately available ER MD   Physician(s) Clearnce Hasten and Joni Fears   Medication changes reported     No   Fall or balance concerns reported    No   Warm-up and Cool-down Performed as group-led Location manager Performed Yes   VAD Patient? No     Pain Assessment   Currently in Pain? No/denies         Goals Met:  Proper associated with RPD/PD & O2 Sat Independence with exercise equipment Exercise tolerated well Strength training completed today  Goals Unmet:  Not Applicable  Comments: Pt able to follow exercise prescription today without complaint.  Will continue to monitor for progression.    Dr. Emily Filbert is Medical Director for [place] and [facility].

## 2015-12-28 NOTE — Progress Notes (Signed)
[name] psychosocial assessment reveals no barriers at this time to participation in Pulmonary Rehab.  He  has good family and friend support that encourages [name] to participate in [place] and progress with His goals.  [name] concerns are monitored, but he  has acknowledge that attending the program has helped to maintain quality life with improved mobility, self-care, and emotional and financial stability.  [name] is commended for regular attendance and self-motivation to improve His pulmonary disease management.

## 2015-12-30 ENCOUNTER — Encounter: Payer: Medicare Other | Admitting: *Deleted

## 2015-12-30 DIAGNOSIS — J449 Chronic obstructive pulmonary disease, unspecified: Secondary | ICD-10-CM

## 2015-12-30 DIAGNOSIS — J9611 Chronic respiratory failure with hypoxia: Secondary | ICD-10-CM | POA: Diagnosis not present

## 2015-12-30 DIAGNOSIS — J439 Emphysema, unspecified: Secondary | ICD-10-CM | POA: Diagnosis not present

## 2015-12-30 DIAGNOSIS — J479 Bronchiectasis, uncomplicated: Secondary | ICD-10-CM

## 2015-12-30 NOTE — Progress Notes (Signed)
Daily Session Note  Patient Details  Name: [name] MRN: [id number] Date of Birth: 1928-10-28 Referring Provider:   Flowsheet Row Pulmonary Rehab from 10/27/2015 in [facility] Cardiac and Pulmonary Rehab  Referring Provider  Simonne Maffucci MD      Encounter Date: 12/30/2015  Check In:     Session Check In - 12/30/15 1230      Check-In   Location [facility]-Cardiac & Pulmonary Rehab   Staff Present Alberteen Sam, MA, ACSM RCEP, Exercise Physiologist;Laureen Owens Shark, BS, RRT, Respiratory Therapist;Other   Supervising physician immediately available to respond to emergencies [facility] immediately available ER MD   Physician(s) Drs. Norm Salt, and Burr   Medication changes reported     No   Fall or balance concerns reported    No   Warm-up and Cool-down Performed as group-led instruction   Resistance Training Performed Yes   VAD Patient? No     Pain Assessment   Currently in Pain? No/denies   Multiple Pain Sites No         Goals Met:  Proper associated with RPD/PD & O2 Sat Independence with exercise equipment Using PLB without cueing & demonstrates good technique Exercise tolerated well Strength training completed today  Goals Unmet:  Not Applicable  Comments: Pt able to follow exercise prescription today without complaint.  Will continue to monitor for progression.    Dr. Emily Filbert is Medical Director for [place] and [facility].

## 2016-01-03 ENCOUNTER — Emergency Department (HOSPITAL_COMMUNITY): Payer: Medicare Other

## 2016-01-03 ENCOUNTER — Encounter (HOSPITAL_COMMUNITY): Payer: Self-pay

## 2016-01-03 ENCOUNTER — Observation Stay (HOSPITAL_COMMUNITY)
Admission: EM | Admit: 2016-01-03 | Discharge: 2016-01-05 | Disposition: A | Discharge status: Home / Self Care | Payer: Medicare Other | Attending: Internal Medicine | Admitting: Internal Medicine

## 2016-01-03 ENCOUNTER — Other Ambulatory Visit: Payer: Self-pay

## 2016-01-03 DIAGNOSIS — Z833 Family history of diabetes mellitus: Secondary | ICD-10-CM | POA: Insufficient documentation

## 2016-01-03 DIAGNOSIS — D531 Other megaloblastic anemias, not elsewhere classified: Secondary | ICD-10-CM | POA: Insufficient documentation

## 2016-01-03 DIAGNOSIS — K219 Gastro-esophageal reflux disease without esophagitis: Secondary | ICD-10-CM | POA: Diagnosis not present

## 2016-01-03 DIAGNOSIS — G629 Polyneuropathy, unspecified: Secondary | ICD-10-CM | POA: Insufficient documentation

## 2016-01-03 DIAGNOSIS — I739 Peripheral vascular disease, unspecified: Secondary | ICD-10-CM | POA: Diagnosis not present

## 2016-01-03 DIAGNOSIS — J320 Chronic maxillary sinusitis: Secondary | ICD-10-CM | POA: Diagnosis not present

## 2016-01-03 DIAGNOSIS — Z87891 Personal history of nicotine dependence: Secondary | ICD-10-CM | POA: Diagnosis not present

## 2016-01-03 DIAGNOSIS — I6523 Occlusion and stenosis of bilateral carotid arteries: Secondary | ICD-10-CM | POA: Diagnosis not present

## 2016-01-03 DIAGNOSIS — G4700 Insomnia, unspecified: Secondary | ICD-10-CM | POA: Diagnosis not present

## 2016-01-03 DIAGNOSIS — J449 Chronic obstructive pulmonary disease, unspecified: Secondary | ICD-10-CM | POA: Diagnosis not present

## 2016-01-03 DIAGNOSIS — I708 Atherosclerosis of other arteries: Secondary | ICD-10-CM | POA: Insufficient documentation

## 2016-01-03 DIAGNOSIS — Z9981 Dependence on supplemental oxygen: Secondary | ICD-10-CM | POA: Insufficient documentation

## 2016-01-03 DIAGNOSIS — I639 Cerebral infarction, unspecified: Secondary | ICD-10-CM | POA: Diagnosis not present

## 2016-01-03 DIAGNOSIS — E876 Hypokalemia: Secondary | ICD-10-CM | POA: Insufficient documentation

## 2016-01-03 DIAGNOSIS — E785 Hyperlipidemia, unspecified: Secondary | ICD-10-CM | POA: Diagnosis present

## 2016-01-03 DIAGNOSIS — J309 Allergic rhinitis, unspecified: Secondary | ICD-10-CM | POA: Diagnosis not present

## 2016-01-03 DIAGNOSIS — I671 Cerebral aneurysm, nonruptured: Secondary | ICD-10-CM | POA: Diagnosis not present

## 2016-01-03 DIAGNOSIS — Z888 Allergy status to other drugs, medicaments and biological substances status: Secondary | ICD-10-CM | POA: Insufficient documentation

## 2016-01-03 DIAGNOSIS — Z7901 Long term (current) use of anticoagulants: Secondary | ICD-10-CM | POA: Diagnosis not present

## 2016-01-03 DIAGNOSIS — F419 Anxiety disorder, unspecified: Secondary | ICD-10-CM | POA: Insufficient documentation

## 2016-01-03 DIAGNOSIS — M4692 Unspecified inflammatory spondylopathy, cervical region: Secondary | ICD-10-CM | POA: Diagnosis not present

## 2016-01-03 DIAGNOSIS — K5900 Constipation, unspecified: Secondary | ICD-10-CM | POA: Insufficient documentation

## 2016-01-03 DIAGNOSIS — Z79899 Other long term (current) drug therapy: Secondary | ICD-10-CM | POA: Insufficient documentation

## 2016-01-03 DIAGNOSIS — R2689 Other abnormalities of gait and mobility: Secondary | ICD-10-CM | POA: Diagnosis not present

## 2016-01-03 DIAGNOSIS — I480 Paroxysmal atrial fibrillation: Secondary | ICD-10-CM

## 2016-01-03 DIAGNOSIS — Z9841 Cataract extraction status, right eye: Secondary | ICD-10-CM | POA: Insufficient documentation

## 2016-01-03 DIAGNOSIS — Z8249 Family history of ischemic heart disease and other diseases of the circulatory system: Secondary | ICD-10-CM | POA: Insufficient documentation

## 2016-01-03 DIAGNOSIS — Z823 Family history of stroke: Secondary | ICD-10-CM | POA: Insufficient documentation

## 2016-01-03 DIAGNOSIS — E7439 Other disorders of intestinal carbohydrate absorption: Secondary | ICD-10-CM | POA: Diagnosis not present

## 2016-01-03 DIAGNOSIS — R29818 Other symptoms and signs involving the nervous system: Secondary | ICD-10-CM | POA: Diagnosis not present

## 2016-01-03 DIAGNOSIS — N401 Enlarged prostate with lower urinary tract symptoms: Secondary | ICD-10-CM | POA: Diagnosis not present

## 2016-01-03 DIAGNOSIS — J479 Bronchiectasis, uncomplicated: Secondary | ICD-10-CM | POA: Diagnosis not present

## 2016-01-03 DIAGNOSIS — I2510 Atherosclerotic heart disease of native coronary artery without angina pectoris: Secondary | ICD-10-CM | POA: Insufficient documentation

## 2016-01-03 DIAGNOSIS — Z9842 Cataract extraction status, left eye: Secondary | ICD-10-CM | POA: Insufficient documentation

## 2016-01-03 DIAGNOSIS — F329 Major depressive disorder, single episode, unspecified: Secondary | ICD-10-CM | POA: Insufficient documentation

## 2016-01-03 DIAGNOSIS — J90 Pleural effusion, not elsewhere classified: Secondary | ICD-10-CM | POA: Diagnosis not present

## 2016-01-03 DIAGNOSIS — I6789 Other cerebrovascular disease: Secondary | ICD-10-CM | POA: Diagnosis not present

## 2016-01-03 LAB — DIFFERENTIAL
BASOS ABS: 0 10*3/uL (ref 0.0–0.1)
Basophils Relative: 0 %
Eosinophils Absolute: 0 10*3/uL (ref 0.0–0.7)
Eosinophils Relative: 0 %
LYMPHS ABS: 1.6 10*3/uL (ref 0.7–4.0)
Lymphocytes Relative: 12 %
Monocytes Absolute: 1 10*3/uL (ref 0.1–1.0)
Monocytes Relative: 7 %
NEUTROS ABS: 10.9 10*3/uL — AB (ref 1.7–7.7)
NEUTROS PCT: 81 %

## 2016-01-03 LAB — I-STAT CHEM 8, ED
BUN: 18 mg/dL (ref 6–20)
CREATININE: 1.1 mg/dL (ref 0.61–1.24)
Calcium, Ion: 1.08 mmol/L — ABNORMAL LOW (ref 1.15–1.40)
Chloride: 102 mmol/L (ref 101–111)
GLUCOSE: 107 mg/dL — AB (ref 65–99)
HEMATOCRIT: 37 % — AB (ref 39.0–52.0)
HEMOGLOBIN: 12.6 g/dL — AB (ref 13.0–17.0)
POTASSIUM: 3.4 mmol/L — AB (ref 3.5–5.1)
Sodium: 143 mmol/L (ref 135–145)
TCO2: 26 mmol/L (ref 0–100)

## 2016-01-03 LAB — CBC
HEMATOCRIT: 39.2 % (ref 39.0–52.0)
HEMOGLOBIN: 13.8 g/dL (ref 13.0–17.0)
MCH: 36.1 pg — ABNORMAL HIGH (ref 26.0–34.0)
MCHC: 35.2 g/dL (ref 30.0–36.0)
MCV: 102.6 fL — ABNORMAL HIGH (ref 78.0–100.0)
Platelets: 271 10*3/uL (ref 150–400)
RBC: 3.82 MIL/uL — AB (ref 4.22–5.81)
RDW: 13.2 % (ref 11.5–15.5)
WBC: 13.6 10*3/uL — AB (ref 4.0–10.5)

## 2016-01-03 LAB — COMPREHENSIVE METABOLIC PANEL
ALBUMIN: 3.9 g/dL (ref 3.5–5.0)
ALT: 9 U/L — AB (ref 17–63)
AST: 16 U/L (ref 15–41)
Alkaline Phosphatase: 63 U/L (ref 38–126)
Anion gap: 9 (ref 5–15)
BUN: 14 mg/dL (ref 6–20)
CHLORIDE: 105 mmol/L (ref 101–111)
CO2: 26 mmol/L (ref 22–32)
CREATININE: 1.22 mg/dL (ref 0.61–1.24)
Calcium: 9 mg/dL (ref 8.9–10.3)
GFR calc Af Amer: >60 mL/min (ref >60)
GFR, EST NON AFRICAN AMERICAN: 52 mL/min — AB (ref >60)
GLUCOSE: 115 mg/dL — AB (ref 65–99)
Potassium: 3.5 mmol/L (ref 3.5–5.1)
Sodium: 140 mmol/L (ref 135–145)
TOTAL PROTEIN: 7.3 g/dL (ref 6.5–8.1)
Total Bilirubin: 0.7 mg/dL (ref 0.3–1.2)

## 2016-01-03 LAB — I-STAT TROPONIN, ED: Troponin i, poc: 0 ng/mL (ref 0.00–0.08)

## 2016-01-03 LAB — HEPARIN LEVEL (UNFRACTIONATED): Heparin Unfractionated: 0.67 IU/mL (ref 0.30–0.70)

## 2016-01-03 LAB — APTT: APTT: 34 s (ref 24–36)

## 2016-01-03 LAB — PROTIME-INR
INR: 1.18
Prothrombin Time: 15 seconds (ref 11.4–15.2)

## 2016-01-03 MED ORDER — METOPROLOL TARTRATE 25 MG PO TABS: 12.5000 mg | ORAL_TABLET | ORAL | Status: DC | PRN

## 2016-01-03 MED ORDER — ACETAMINOPHEN 650 MG RE SUPP: 650.0000 mg | RECTAL | Status: DC | PRN

## 2016-01-03 MED ORDER — LORAZEPAM 2 MG/ML IJ SOLN
0.5000 mg | Freq: Once | INTRAMUSCULAR | Status: AC
  Administered 2016-01-03: 0.5 mg via INTRAVENOUS
  Filled 2016-01-03: qty 1

## 2016-01-03 MED ORDER — FLUTICASONE PROPIONATE 50 MCG/ACT NA SUSP
2.0000 | Freq: Every day | NASAL | Status: DC | PRN
  Filled 2016-01-03: qty 16

## 2016-01-03 MED ORDER — OCUVITE-LUTEIN PO CAPS
1.0000 | ORAL_CAPSULE | Freq: Every day | ORAL | Status: DC
  Filled 2016-01-03: qty 1

## 2016-01-03 MED ORDER — ENOXAPARIN SODIUM 80 MG/0.8ML ~~LOC~~ SOLN
80.0000 mg | Freq: Two times a day (BID) | SUBCUTANEOUS | Status: DC
  Administered 2016-01-04 (×2): 80 mg via SUBCUTANEOUS
  Filled 2016-01-03 (×2): qty 0.8

## 2016-01-03 MED ORDER — TRAMADOL HCL 50 MG PO TABS
50.0000 mg | ORAL_TABLET | Freq: Two times a day (BID) | ORAL | Status: DC | PRN
  Administered 2016-01-04: 50 mg via ORAL
  Filled 2016-01-03: qty 1

## 2016-01-03 MED ORDER — ACETAMINOPHEN 325 MG PO TABS: 650.0000 mg | ORAL_TABLET | ORAL | Status: DC | PRN

## 2016-01-03 MED ORDER — TRAZODONE HCL 100 MG PO TABS
200.0000 mg | ORAL_TABLET | Freq: Every day | ORAL | Status: DC
  Administered 2016-01-04: 200 mg via ORAL
  Filled 2016-01-03: qty 2

## 2016-01-03 MED ORDER — TIOTROPIUM BROMIDE MONOHYDRATE 18 MCG IN CAPS
18.0000 ug | ORAL_CAPSULE | Freq: Every day | RESPIRATORY_TRACT | Status: DC
  Filled 2016-01-03 (×2): qty 5

## 2016-01-03 MED ORDER — IOPAMIDOL (ISOVUE-370) INJECTION 76%
INTRAVENOUS | Status: AC
  Administered 2016-01-03: 50 mL via INTRAVENOUS
  Filled 2016-01-03: qty 100

## 2016-01-03 MED ORDER — ALBUTEROL SULFATE (2.5 MG/3ML) 0.083% IN NEBU: 3.0000 mL | INHALATION_SOLUTION | RESPIRATORY_TRACT | Status: DC | PRN

## 2016-01-03 MED ORDER — SENNOSIDES-DOCUSATE SODIUM 8.6-50 MG PO TABS: 1.0000 | ORAL_TABLET | Freq: Every evening | ORAL | Status: DC | PRN

## 2016-01-03 MED ORDER — STROKE: EARLY STAGES OF RECOVERY BOOK
Freq: Once | Status: AC
  Administered 2016-01-04: 01:00:00
  Filled 2016-01-03: qty 1

## 2016-01-03 MED ORDER — ACETAMINOPHEN 160 MG/5ML PO SOLN: 650.0000 mg | ORAL | Status: DC | PRN

## 2016-01-03 MED ORDER — PROSIGHT PO TABS
1.0000 | ORAL_TABLET | Freq: Every day | ORAL | Status: DC
  Administered 2016-01-04 – 2016-01-05 (×2): 1 via ORAL
  Filled 2016-01-03 (×2): qty 1

## 2016-01-03 MED ORDER — TAMSULOSIN HCL 0.4 MG PO CAPS
0.8000 mg | ORAL_CAPSULE | Freq: Every day | ORAL | Status: DC
  Administered 2016-01-04 – 2016-01-05 (×2): 0.8 mg via ORAL
  Filled 2016-01-03 (×2): qty 2

## 2016-01-03 MED ORDER — SODIUM CHLORIDE 0.9 % IV SOLN
INTRAVENOUS | Status: AC
  Administered 2016-01-03: 21:00:00 via INTRAVENOUS

## 2016-01-03 MED ORDER — PANTOPRAZOLE SODIUM 40 MG PO TBEC
40.0000 mg | DELAYED_RELEASE_TABLET | Freq: Every day | ORAL | Status: DC
  Administered 2016-01-04 – 2016-01-05 (×2): 40 mg via ORAL
  Filled 2016-01-03 (×2): qty 1

## 2016-01-03 NOTE — ED Triage Notes (Signed)
Pt arrived via EMS as a code stroke. Per EMS, pt came home from [place] around 3 pm today, watched half of the [place] game on television and when he went to stand up he had trouble standing, trouble seeing, dizziness, and blurred vision. Per EMS, pt also c/o loss of peripheral vision. Per EMS, pt wife gave him his dose of metoprolol at ~5 PM today. Pt in A fib RVR 130 bpm en route, in sinus rhythm with 1st degree block on arrival. Pt on Xarelto. Right sided gaze noted. Pt denies Ha, N/V, numbness/tingling. Pt on 6 L O2 at home. CBG 126. Facial symmetry noted. A&Ox4.

## 2016-01-03 NOTE — ED Provider Notes (Signed)
MSE was initiated and I personally evaluated the patient and placed orders (if any) at  5:35 PM on January 03, 2016.  The patient appears stable so that the remainder of the MSE may be completed by another provider.  He was transported as a code stroke because of vision loss that started at 3 PM. Vision is returning. He has no airway issues and is sent directly to CT scan.   Delora Fuel, MD 40/76/80 [id number]

## 2016-01-03 NOTE — Progress Notes (Signed)
ANTICOAGULATION CONSULT NOTE - Initial Consult  Pharmacy Consult for enoxaparin Indication: atrial fibrillation  Allergies  Allergen Reactions  . Rosuvastatin     REACTION: myalgias at 59m/day dose    Patient Measurements: Height: 5' 9" (175.3 cm) Weight: 166 lb 4.8 oz (75.4 kg) IBW/kg (Calculated) : 70.7  Vital Signs: Temp: 97.7 F (36.5 C) (12/17 2030) Temp Source: Axillary (12/17 2030) BP: 156/87 (12/17 2030) Pulse Rate: 64 (12/17 2030)  Labs:  Recent Labs  01/03/16 1738 01/03/16 1746  HGB 13.8 12.6*  HCT 39.2 37.0*  PLT 271  --   APTT 34  --   LABPROT 15.0  --   INR 1.18  --   HEPARINUNFRC 0.67  --   CREATININE 1.22 1.10    Estimated Creatinine Clearance: 49.1 mL/min (by C-G formula based on SCr of 1.1 mg/dL).   Medical History: Past Medical History:  Diagnosis Date  . [place]    after PT[place] 2012  . Allergy   . Arthritis    h/o R shoulder injection per ortho  . Back pain   . BPH (benign prostatic hyperplasia)   . COPD (chronic obstructive pulmonary disease) ([place]   . Coronary artery disease   . Elevated MCV   . Erectile dysfunction   . GERD (gastroesophageal reflux disease)   . HSV-1 (herpes simplex virus 1) infection   . Hyperlipidemia   . Spontaneous pneumothorax 09/04/2010   [name]    Medications:  Prescriptions Prior to Admission  Medication Sig Dispense Refill Last Dose  . acetaminophen (TYLENOL) 325 MG tablet Take 650 mg by mouth every 6 (six) hours as needed for mild pain.    unk  . albuterol (PROAIR HFA) 108 (90 Base) MCG/ACT inhaler Inhale 2 puffs into the lungs every 4 (four) hours as needed for wheezing or shortness of breath. 1 Inhaler 2 unk  . Cyanocobalamin (VITAMIN B 12 PO) Take by mouth daily.   01/02/2016 at Unknown time  . FIBER PO Take 1 capsule by mouth 2 (two) times daily.   01/03/2016 at Unknown time  . fluticasone (FLONASE) 50 MCG/ACT nasal spray Place 2 sprays into both nostrils daily.  (Patient taking differently: Place 2 sprays into both nostrils daily as needed for allergies. )   unk  . metoprolol tartrate (LOPRESSOR) 25 MG tablet Take 0.5 tablets (12.5 mg total) by mouth as needed (for palpitations).   01/03/2016 at 1500  . Multiple Vitamin (MULTIVITAMIN) tablet Take 1 tablet by mouth daily. Reported on 04/22/2015   01/02/2016 at Unknown time  . multivitamin-lutein (OCUVITE-LUTEIN) CAPS capsule Take 1 capsule by mouth daily.   01/02/2016 at Unknown time  . NON FORMULARY Oxygen 2-5 liters 24/7   01/03/2016 at Unknown time  . omeprazole (PRILOSEC) 20 MG capsule Take 20 mg by mouth daily.     01/03/2016 at Unknown time  . Respiratory Therapy Supplies (FLUTTER) DEVI Use as directed 1 each 0 unk  . Rivaroxaban (XARELTO) 15 MG TABS tablet Take 15 mg by mouth every morning.   01/02/2016 at Unknown time  . tamsulosin (FLOMAX) 0.4 MG CAPS capsule Take 2 capsules (0.8 mg total) by mouth daily. 180 capsule 3 01/03/2016 at Unknown time  . tiotropium (SPIRIVA) 18 MCG inhalation capsule Place 1 capsule (18 mcg total) into inhaler and inhale daily. 30 capsule 6 01/03/2016 at Unknown time  . traMADol (ULTRAM) 50 MG tablet TAKE 1 TABLET BY MOUTH 12 AS NEEDED FOR PAIN 60 tablet 2 01/02/2016 at Unknown time  .  traZODone (DESYREL) 100 MG tablet TAKE 2 TABLETS (200 MG TOTAL) BY MOUTH AT BEDTIME. 180 tablet 3 01/02/2016 at Unknown time    Assessment: 82 yo M admitted 01/03/2016 with suspected right frontal infarct, did not receive tPA. Patient on Xarelto PTA for atrial fibrillation, last dose 12/16 AM. Pharmacy consulted to dose enoxaparin for Afib.   Anti-Xa elevated 0.67 due to Xarelto, Hgb 12.6, Plt wnl. No s/sx of bleeding noted.  Goal of Therapy:  Monitor platelets by anticoagulation protocol: Yes   Plan:  - Lovenox 80 mg SQ q12h - [place] CBC and signs/symptoms of bleeding  Dimitri Ped, PharmD, BCPS PGY-2 Infectious Diseases Pharmacy Resident Pager: [phone] 01/03/2016,9:25 PM

## 2016-01-03 NOTE — H&P (Signed)
History and Physical    [name] [facility] TJQ:[id number] DOB: 1928/05/04 DOA: 01/03/2016  PCP: Randall Stain, MD  Patient coming from: Home.  Chief Complaint: Visual disturbance and right-sided visual gaze preference.  HPI: [name] is a 82 y.o. male with history of atrial fibrillation, COPD, CAD, history of spontaneous pneumothorax presents to ER with complaints of patient having blurry vision. Patient's symptoms started around 3 PM after patient woke up from sleep and was watching TV. Patient initially felt some weakness in the lower extremities followed by the visual symptoms. On exam patient has been having right-sided gaze preference. Patient was able to move all extremities without difficulty. Neurologist on-call was consulted. 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 This was followed by CT angiogram of the head and neck since patient was unable to have MRI. Patient is being admitted for further stroke workup. Patient takes Xarelto for A. fib and has not taken his dose today.   ED Course: Patient had neurology consulted and head CT head followed by CT angiogram of the head and neck. Patient failed swallow evaluation.  Review of Systems: As per HPI, rest all negative.   Past Medical History:  Diagnosis Date  . AF (atrial fibrillation) ([place])    after PTX 2012  . Allergy   . Arthritis    h/o R shoulder injection per ortho  . Back pain   . BPH (benign prostatic hyperplasia)   . COPD (chronic obstructive pulmonary disease) ([place])   . Coronary artery disease   . Elevated MCV   . Erectile dysfunction   . GERD (gastroesophageal reflux disease)   . HSV-1 (herpes simplex virus 1) infection   . Hyperlipidemia   . Spontaneous pneumothorax 09/04/2010   [name]    Past Surgical History:  Procedure Laterality Date  . CATARACT EXTRACTION, BILATERAL    . CHEST TUBE PLACEMENT  09/04/2010  . VIDEO ASSISTED THORACOSCOPY   12/15/2010   Procedure: VIDEO ASSISTED THORACOSCOPY;  Surgeon: Melrose Nakayama, MD;  Location: [facility];  Service: Thoracic;  Laterality: Left;  blebectomy with pleural abrasion  . VIDEO ASSISTED THORACOSCOPY Right 06/28/2013   Procedure: VIDEO ASSISTED THORACOSCOPY with stapling of blebs;  Surgeon: Melrose Nakayama, MD;  Location: [place];  Service: Thoracic;  Laterality: Right;  (R) VATS W/BLEBECTOMY     reports that he quit smoking about 16 years ago. His smoking use included Cigarettes. He has a 50.00 pack-year smoking history. He has never used smokeless tobacco. He reports that he does not drink alcohol or use drugs.  Allergies  Allergen Reactions  . Rosuvastatin     REACTION: myalgias at 66m/day dose    Family History  Problem Relation Age of Onset  . Diabetes Mother   . Peripheral Artery Disease Father   . Colon cancer Brother     possible dx, not certain  . Hypertension    . Stroke Brother   . Prostate cancer Neg Hx   . Heart attack Neg Hx     Prior to Admission medications   Medication Sig Start Date End Date Taking? Authorizing Provider  acetaminophen (TYLENOL) 325 MG tablet Take 650 mg by mouth every 6 (six) hours as needed for mild pain.    Yes Historical Provider, MD  albuterol (PROAIR HFA) 108 (90 Base) MCG/ACT inhaler Inhale 2 puffs into the lungs every 4 (four) hours as needed for wheezing or shortness of breath. 11/03/15  Yes DJuanito Doom  MD  Cyanocobalamin (VITAMIN B 12 PO) Take by mouth daily.   Yes Historical Provider, MD  FIBER PO Take 1 capsule by mouth 2 (two) times daily.   Yes Historical Provider, MD  fluticasone (FLONASE) 50 MCG/ACT nasal spray Place 2 sprays into both nostrils daily. Patient taking differently: Place 2 sprays into both nostrils daily as needed for allergies.  01/29/14  Yes Tonia Ghent, MD  metoprolol tartrate (LOPRESSOR) 25 MG tablet Take 0.5 tablets (12.5 mg total) by mouth as needed (for palpitations). 09/09/14  Yes Tonia Ghent, MD  Multiple Vitamin (MULTIVITAMIN) tablet Take 1 tablet by mouth daily. Reported on 04/22/2015   Yes Historical Provider, MD  multivitamin-lutein [facility]) CAPS capsule Take 1 capsule by mouth daily.   Yes Historical Provider, MD  NON FORMULARY Oxygen 2-5 liters 24/7   Yes Historical Provider, MD  omeprazole (PRILOSEC) 20 MG capsule Take 20 mg by mouth daily.     Yes Historical Provider, MD  Respiratory Therapy Supplies (FLUTTER) DEVI Use as directed 09/25/15  Yes Juanito Doom, MD  Rivaroxaban (XARELTO) 15 MG TABS tablet Take 15 mg by mouth every morning.   Yes Historical Provider, MD  tamsulosin (FLOMAX) 0.4 MG CAPS capsule Take 2 capsules (0.8 mg total) by mouth daily. 12/24/15  Yes Tonia Ghent, MD  tiotropium (SPIRIVA) 18 MCG inhalation capsule Place 1 capsule (18 mcg total) into inhaler and inhale daily. 06/05/15  Yes Tammy S Parrett, NP  traMADol (ULTRAM) 50 MG tablet TAKE 1 TABLET BY MOUTH 12 AS NEEDED FOR PAIN 10/22/15  Yes Tonia Ghent, MD  traZODone (DESYREL) 100 MG tablet TAKE 2 TABLETS (200 MG TOTAL) BY MOUTH AT BEDTIME. 12/24/15  Yes Tonia Ghent, MD    Physical Exam: Vitals:   01/03/16 1848 01/03/16 1900 01/03/16 1943 01/03/16 1945  BP: 148/82 139/70  156/86  Pulse: 78 79  102  Resp: _0 Temp:   97.5 F (36.4 C)   SpO2: 94% 98%  100%  Weight:      Height:          Constitutional: Moderately built and nourished. Vitals:   01/03/16 1848 01/03/16 1900 01/03/16 1943 01/03/16 1945  BP: 148/82 139/70  156/86  Pulse: 78 79  102  Resp: _1 Temp:   97.5 F (36.4 C)   SpO2: 94% 98%  100%  Weight:      Height:       Eyes: Anicteric no pallor. Right-sided gaze preference. ENMT: No discharge from the ears eyes nose and mouth. Neck: No mass felt. No neck rigidity. Respiratory: No rhonchi or crepitations. Cardiovascular: S1-S2 heard no murmurs appreciated. Abdomen: Soft nontender bowel sounds present. No guarding or  rigidity. Musculoskeletal: No edema. No joint effusion. Skin: Appears warm. No rash. Neurologic: Alert awake oriented to time place and person. Moves all extremities 5 x 5. Right-sided gaze preference. No facial asymmetry. Tongue is midline. Psychiatric: Appears normal. Normal affect.   Labs on Admission: I have personally reviewed following labs and imaging studies  CBC:  Recent Labs Lab 01/03/16 1738 01/03/16 1746  WBC 13.6*  --   NEUTROABS 10.9*  --   HGB 13.8 12.6*  HCT 39.2 37.0*  MCV 102.6*  --   PLT 271  --    Basic Metabolic Panel:  Recent Labs Lab 01/03/16 1738 01/03/16 1746  NA 140 143  K 3.5 3.4*  CL 105 102  CO2 26  --  GLUCOSE 115* 107*  BUN 14 18  CREATININE 1.22 1.10  CALCIUM 9.0  --    GFR: Estimated Creatinine Clearance: 49.1 mL/min (by C-G formula based on SCr of 1.1 mg/dL). Liver Function Tests:  Recent Labs Lab 01/03/16 1738  AST 16  ALT 9*  ALKPHOS 63  BILITOT 0.7  PROT 7.3  ALBUMIN 3.9   No results for input(s): LIPASE, AMYLASE in the last 168 hours. No results for input(s): AMMONIA in the last 168 hours. Coagulation Profile:  Recent Labs Lab 01/03/16 1738  INR 1.18   Cardiac Enzymes: No results for input(s): CKTOTAL, CKMB, CKMBINDEX, TROPONINI in the last 168 hours. BNP (last 3 results)  Recent Labs  09/10/15 1436  PROBNP 105.0*   HbA1C: No results for input(s): HGBA1C in the last 72 hours. CBG: No results for input(s): GLUCAP in the last 168 hours. Lipid Profile: No results for input(s): CHOL, HDL, LDLCALC, TRIG, CHOLHDL, LDLDIRECT in the last 72 hours. Thyroid Function Tests: No results for input(s): TSH, T4TOTAL, FREET4, T3FREE, THYROIDAB in the last 72 hours. Anemia Panel: No results for input(s): VITAMINB12, FOLATE, FERRITIN, TIBC, IRON, RETICCTPCT in the last 72 hours. Urine analysis:    Component Value Date/Time   COLORURINE LT. YELLOW 02/02/2012 1458   APPEARANCEUR CLEAR 02/02/2012 1458   LABSPEC 1.010  02/02/2012 1458   PHURINE 6.0 02/02/2012 1458   GLUCOSEU NEGATIVE 02/02/2012 1458   HGBUR NEGATIVE 02/02/2012 1458   BILIRUBINUR NEGATIVE 02/02/2012 1458   BILIRUBINUR Neg 07/14/2011 1222   KETONESUR NEGATIVE 02/02/2012 1458   PROTEINUR Neg 07/14/2011 1222   PROTEINUR NEGATIVE 12/14/2010 1633   UROBILINOGEN 1.0 02/02/2012 1458   NITRITE NEGATIVE 02/02/2012 1458   LEUKOCYTESUR NEGATIVE 02/02/2012 1458   Sepsis Labs: _0 (procalcitonin:4,lacticidven:4) )No results found for this or any previous visit (from the past 240 hour(s)).   Radiological Exams on Admission: Ct Angio Head W Or Wo Contrast  Result Date: 01/03/2016 CLINICAL DATA:  Acute onset right gaze preference. History of atrial fibrillation. EXAM: CT ANGIOGRAPHY HEAD AND NECK TECHNIQUE: Multidetector CT imaging of the head and neck was performed using the standard protocol during bolus administration of intravenous contrast. Multiplanar CT image reconstructions and MIPs were obtained to evaluate the vascular anatomy. Carotid stenosis measurements (when applicable) are obtained utilizing NASCET criteria, using the distal internal carotid diameter as the denominator. CONTRAST:  50 mL Isovue 370 COMPARISON:  None. FINDINGS: CTA NECK FINDINGS Aortic arch: Standard branching. Extensive calcified and noncalcified plaque in the aortic arch. No significant arch vessel origins stenosis. Right carotid system: Medial course of the proximal common carotid artery. Moderate to heavy atherosclerotic plaque at the carotid bifurcation and in the proximal ICA, predominantly calcified. No significant stenosis or evidence of dissection. Left carotid system: Medial course of the proximal common carotid artery in the neck. Moderately extensive calcified plaque at the carotid bifurcation with mixed calcified and noncalcified plaque in the proximal ICA. Proximal ICA narrowing is less than 50%. No evidence of dissection. Vertebral arteries: Patent vertebral  arteries with the left minimally larger than the right. Atherosclerotic plaque involving the vertebral artery origins and proximal V1 segments bilaterally without significant stenosis. Skeleton: Moderately advanced lower cervical disc degeneration. Partial ankylosis across the disc spaces at C7-T1 and T1-2. Moderate cervical facet arthrosis. Other neck: Evidence of chronic bilateral maxillary sinusitis with mild mucosal thickening on the left and small volume fluid/ secretions bilaterally which may reflect superimposed acute sinusitis. No neck mass or lymph node enlargement. Upper chest: Advanced centrilobular emphysema. Review  of the MIP images confirms the above findings CTA HEAD FINDINGS Anterior circulation: Internal carotid arteries are patent from skullbase to carotid termini with right greater than left siphon atherosclerosis resulting in mild right cavernous segment stenosis. ACAs and MCAs are patent without evidence of major branch occlusion or significant stenosis. A 2 mm aneurysm projects laterally from the left MCA bifurcation. Posterior circulation: Vertebral arteries are widely patent to the basilar. PICA and SCA origins appear patent. Basilar artery is widely patent. There is a small patent posterior communicating artery on the left. PCAs are patent without evidence of significant stenosis. No aneurysm. Venous sinuses: Patent. Anatomic variants: None. Delayed phase: No abnormal enhancement. Review of the MIP images confirms the above findings IMPRESSION: 1. No large vessel occlusion. 2. Mild right cavernous ICA stenosis. 3. 2 mm left MCA bifurcation aneurysm. 4. Cervical carotid and vertebral artery atherosclerosis without significant stenosis. Electronically Signed   By: Logan Bores M.D.   On: 01/03/2016 20:04   Ct Angio Neck W Or Wo Contrast  Result Date: 01/03/2016 CLINICAL DATA:  Acute onset right gaze preference. History of atrial fibrillation. EXAM: CT ANGIOGRAPHY HEAD AND NECK TECHNIQUE:  Multidetector CT imaging of the head and neck was performed using the standard protocol during bolus administration of intravenous contrast. Multiplanar CT image reconstructions and MIPs were obtained to evaluate the vascular anatomy. Carotid stenosis measurements (when applicable) are obtained utilizing NASCET criteria, using the distal internal carotid diameter as the denominator. CONTRAST:  50 mL Isovue 370 COMPARISON:  None. FINDINGS: CTA NECK FINDINGS Aortic arch: Standard branching. Extensive calcified and noncalcified plaque in the aortic arch. No significant arch vessel origins stenosis. Right carotid system: Medial course of the proximal common carotid artery. Moderate to heavy atherosclerotic plaque at the carotid bifurcation and in the proximal ICA, predominantly calcified. No significant stenosis or evidence of dissection. Left carotid system: Medial course of the proximal common carotid artery in the neck. Moderately extensive calcified plaque at the carotid bifurcation with mixed calcified and noncalcified plaque in the proximal ICA. Proximal ICA narrowing is less than 50%. No evidence of dissection. Vertebral arteries: Patent vertebral arteries with the left minimally larger than the right. Atherosclerotic plaque involving the vertebral artery origins and proximal V1 segments bilaterally without significant stenosis. Skeleton: Moderately advanced lower cervical disc degeneration. Partial ankylosis across the disc spaces at C7-T1 and T1-2. Moderate cervical facet arthrosis. Other neck: Evidence of chronic bilateral maxillary sinusitis with mild mucosal thickening on the left and small volume fluid/ secretions bilaterally which may reflect superimposed acute sinusitis. No neck mass or lymph node enlargement. Upper chest: Advanced centrilobular emphysema. Review of the MIP images confirms the above findings CTA HEAD FINDINGS Anterior circulation: Internal carotid arteries are patent from skullbase to  carotid termini with right greater than left siphon atherosclerosis resulting in mild right cavernous segment stenosis. ACAs and MCAs are patent without evidence of major branch occlusion or significant stenosis. A 2 mm aneurysm projects laterally from the left MCA bifurcation. Posterior circulation: Vertebral arteries are widely patent to the basilar. PICA and SCA origins appear patent. Basilar artery is widely patent. There is a small patent posterior communicating artery on the left. PCAs are patent without evidence of significant stenosis. No aneurysm. Venous sinuses: Patent. Anatomic variants: None. Delayed phase: No abnormal enhancement. Review of the MIP images confirms the above findings IMPRESSION: 1. No large vessel occlusion. 2. Mild right cavernous ICA stenosis. 3. 2 mm left MCA bifurcation aneurysm. 4. Cervical carotid and vertebral artery  atherosclerosis without significant stenosis. Electronically Signed   By: Logan Bores M.D.   On: 01/03/2016 20:04   Ct Head Code Stroke W/o Cm  Result Date: 01/03/2016 CLINICAL DATA:  Code stroke.  Visual loss and right-sided gaze. EXAM: CT HEAD WITHOUT CONTRAST TECHNIQUE: Contiguous axial images were obtained from the base of the skull through the vertex without intravenous contrast. COMPARISON:  None. FINDINGS: Brain: There is no evidence of acute cortical infarct, intracranial hemorrhage, mass, midline shift, or extra-axial fluid collection. There is asymmetric hypoattenuation involving the posterior and superior aspects of the right lentiform nucleus with asymmetric white matter low density in the right corona radiata/ external capsule. Subcortical and deep cerebral white matter hypodensities are nonspecific but compatible with moderate chronic small vessel ischemic disease. There is moderate cerebral atrophy. A punctate cortical calcification is noted in the right occipital lobe. Vascular: No hyperdense vessel. Calcified atherosclerosis at the skullbase.  Skull: No fracture or focal osseous lesion. Sinuses/Orbits: Evidence of chronic bilateral maxillary sinusitis. Partially visualized left maxillary sinus mucosal thickening and small bilateral maxillary sinus fluid/ secretions which may indicate a superimposed acute component. Visualized mastoid air cells are clear. Prior bilateral cataract extraction. Other: None. ASPECTS [facility] Stroke Program Early CT Score) - Ganglionic level infarction (caudate, lentiform nuclei, internal capsule, insula, M1-M3 cortex): 6 (1 point subtracted for right lentiform nucleus) - Supraganglionic infarction (M4-M6 cortex): 3 Total score (0-10 with 10 being normal): 9 IMPRESSION: 1. Possible age-indeterminate infarct in the right lentiform nucleus. No acute intracranial hemorrhage. 2. ASPECTS is 9. 3. Moderate chronic small vessel ischemic disease. These results were called by telephone at the time of interpretation on 01/03/2016 at 6:12 pm to Dr. Armida Sans, who verbally acknowledged these results. Electronically Signed   By: Logan Bores M.D.   On: 01/03/2016 18:17     Assessment/Plan Principal Problem:   Acute embolic stroke (HCC) Active Problems:   HLD (hyperlipidemia)   PAF (paroxysmal atrial fibrillation)    PAD (peripheral artery disease) (HCC)   Stroke (cerebrum) ([place])    1. Acute embolic stroke - appreciate neurology consult. Patient has failed swallow and has been placed on Lovenox (after discussing with neurologist Dr. Cheral Marker) until patient can take xarelto following swallow therapist evaluation. Check 2-D echo. Neuro checks. Check hemoglobin A1c and lipid panel and physical therapy consult. 2. Paroxysmal atrial fibrillation - continue metoprolol. Presently on Lovenox since patient failed swallow. See #1 with regarding to xarelto. 3. COPD - not actively wheezing. Continue home inhalers. 4. History of CAD - denies any chest pain. 5. History of recurrent pneumothorax - denies any shortness of breath.  EKG and  chest x-ray are pending.   DVT prophylaxis: Lovenox. Code Status: Full code.  Family Communication: Patient's wife.  Disposition Plan: Home.  Consults called: Neurology.  Admission status: Observation.    Rise Patience MD [facility] Pager [phone].  If 7PM-7AM, please contact night-coverage [email or web address] Password [facility]  01/03/2016, 8:36 PM

## 2016-01-03 NOTE — ED Provider Notes (Signed)
[facility] DEPT Provider Note   CSN: [id number] Arrival date & time: 01/03/16  1737   An emergency department physician performed an initial assessment on this suspected stroke patient at 1734.  History   Chief Complaint Chief Complaint  Patient presents with  . Code Stroke    HPI [name] is a 82 y.o. male.  82 year old male brought in as a code stroke. He was watching television at about 3 PM when he noted that the television was blurry. His vision does seem to be improving but is not back to baseline. He has a history of COPD but no history of cerebrovascular disease. He is on rivaroxaban for atrial fibrillation. He denies headache. Denies weakness, numbness, tingling.   The history is provided by the patient.    Past Medical History:  Diagnosis Date  . AF (atrial fibrillation) ([place])    after PTX 2012  . Allergy   . Arthritis    h/o R shoulder injection per ortho  . Back pain   . BPH (benign prostatic hyperplasia)   . COPD (chronic obstructive pulmonary disease) ([place])   . Coronary artery disease   . Elevated MCV   . Erectile dysfunction   . GERD (gastroesophageal reflux disease)   . HSV-1 (herpes simplex virus 1) infection   . Hyperlipidemia   . Spontaneous pneumothorax 09/04/2010   [name]    Patient Active Problem List   Diagnosis Date Noted  . Bronchiectasis without acute exacerbation ([place]) 09/25/2015  . Abnormal CBC 07/16/2015  . Excess ear wax 07/16/2015  . Leg ulcer ([place]) 04/30/2015  . Advance care planning 05/23/2014  . Chronic hypoxemic respiratory failure ([place]) 09/05/2013  . Peripheral edema 07/16/2013  . Situational anxiety 07/16/2013  . Chronic anticoagulation- Xarelto since 2012 (PAF) 07/03/2013  . Recurrent spontaneous pneumothorax- s/p VATS 06/29/13 06/27/2013  . Skin lesion 09/13/2012  . Exertional shortness of breath 06/03/2012  . Fall at home 02/19/2012  . Constipation 10/12/2011  . Back pain 07/14/2011  . Urinary  frequency 07/14/2011  . Medicare annual wellness visit, subsequent 04/25/2011  . Insomnia 04/25/2011  . PAD (peripheral artery disease) ([place]) 04/15/2011  . Neuropathy ([place]) 02/15/2011  . ED (erectile dysfunction) 09/28/2010  . PAF (paroxysmal atrial fibrillation)  09/09/2010  . Pneumothorax, left 09/06/2010  . NEOPLASMS UNSPEC NATURE BONE SOFT TISSUE&SKIN 02/10/2010  . MUSCLE CRAMPS 09/01/2009  . NICOTINE ADDICTION 02/06/2008  . UNSPEC DISORDER CARBOHYDRATE TRANSPORT&METAB 01/29/2007  . G E R D 07/28/2006  . HEMORRHOIDS 07/27/2006  . COPD 07/27/2006  . HYPERTHERMIA, MALIGNANT, FH 07/27/2006  . HX, PERSONAL, ALCOHOLISM 07/27/2006  . HERPES SIMPLEX INFECTION, TYPE I 01/17/2006  . GLUCOSE INTOLERANCE 11/18/2002  . ARTHRITIS, CERVICAL SPINE 12/17/2001  . OSTEOPENIA 12/17/2001  . Allergic rhinitis 11/17/2001  . DIVERTICULOSIS, COLON 10/18/1998  . DEPRESSION 04/17/1996  . 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 HLD (hyperlipidemia) 10/17/1985    Past Surgical History:  Procedure Laterality Date  . CATARACT EXTRACTION, BILATERAL    . CHEST TUBE PLACEMENT  09/04/2010  . VIDEO ASSISTED THORACOSCOPY  12/15/2010   Procedure: VIDEO ASSISTED THORACOSCOPY;  Surgeon: Melrose Nakayama, MD;  Location: [facility];  Service: Thoracic;  Laterality: Left;  blebectomy with pleural abrasion  . VIDEO ASSISTED THORACOSCOPY Right 06/28/2013   Procedure: VIDEO ASSISTED THORACOSCOPY with stapling of blebs;  Surgeon: Melrose Nakayama, MD;  Location: [facility];  Service: Thoracic;  Laterality: Right;  (R) VATS W/BLEBECTOMY       Home Medications    Prior to Admission  medications   Medication Sig Start Date End Date Taking? Authorizing Provider  acetaminophen (TYLENOL) 325 MG tablet Take 650 mg by mouth every 6 (six) hours as needed.    Historical Provider, MD  albuterol (PROAIR HFA) 108 (90 Base) MCG/ACT inhaler Inhale 2 puffs into the lungs every 4 (four) hours as needed for wheezing or shortness of  breath. 11/03/15   Juanito Doom, MD  Cyanocobalamin (VITAMIN B 12 PO) Take by mouth daily.    Historical Provider, MD  FIBER PO Take 1 capsule by mouth 2 (two) times daily.    Historical Provider, MD  fluticasone (FLONASE) 50 MCG/ACT nasal spray Place 2 sprays into both nostrils daily. 01/29/14   Tonia Ghent, MD  metoprolol tartrate (LOPRESSOR) 25 MG tablet Take 0.5 tablets (12.5 mg total) by mouth as needed (for palpitations). 09/09/14   Tonia Ghent, MD  Multiple Vitamin (MULTIVITAMIN) tablet Take 1 tablet by mouth daily. Reported on 04/22/2015    Historical Provider, MD  multivitamin-lutein [facility]) CAPS capsule Take 1 capsule by mouth daily.    Historical Provider, MD  NON FORMULARY Oxygen 2-5 liters 24/7    Historical Provider, MD  omeprazole (PRILOSEC) 20 MG capsule Take 20 mg by mouth daily.      Historical Provider, MD  Respiratory Therapy Supplies (FLUTTER) DEVI Use as directed 09/25/15   Juanito Doom, MD  Rivaroxaban (XARELTO) 15 MG TABS tablet Take 15 mg by mouth every morning.    Historical Provider, MD  tamsulosin (FLOMAX) 0.4 MG CAPS capsule Take 2 capsules (0.8 mg total) by mouth daily. 12/24/15   Tonia Ghent, MD  tiotropium (SPIRIVA) 18 MCG inhalation capsule Place 1 capsule (18 mcg total) into inhaler and inhale daily. 06/05/15   Tammy S Parrett, NP  traMADol (ULTRAM) 50 MG tablet TAKE 1 TABLET BY MOUTH 12 AS NEEDED FOR PAIN 10/22/15   Tonia Ghent, MD  traZODone (DESYREL) 100 MG tablet TAKE 2 TABLETS (200 MG TOTAL) BY MOUTH AT BEDTIME. 12/24/15   Tonia Ghent, MD    Family History Family History  Problem Relation Age of Onset  . Diabetes Mother   . Peripheral Artery Disease Father   . Colon cancer Brother     possible dx, not certain  . Hypertension    . Stroke Brother   . Prostate cancer Neg Hx   . Heart attack Neg Hx     Social History Social History  Substance Use Topics  . Smoking status: Former Smoker    Packs/day: 1.00    Years:  50.00    Types: Cigarettes    Quit date: 04/14/1999  . Smokeless tobacco: Never Used  . Alcohol use No     Allergies   Rosuvastatin   Review of Systems Review of Systems  All other systems reviewed and are negative.    Physical Exam Updated Vital Signs BP 150/87   Pulse 98   Resp 23   Ht _0  (1.753 m)   Wt 170 lb 6.7 oz (77.3 kg)   SpO2 100%   BMI 25.17 kg/m   Physical Exam  Nursing note and vitals reviewed.  82 year old male, resting comfortably and in no acute distress. Vital signs are significant for hypertension and tachypnea. Oxygen saturation is 100%, which is normal. Head is normocephalic and atraumatic. PERRLA. Eyes are deviated to the right and he will not gaze even close to the midline. Oropharynx is clear. Neck is nontender and supple without adenopathy or  JVD. There are no carotid bruits. Back is nontender and there is no CVA tenderness. Lungs are clear without rales, wheezes, or rhonchi. Chest is nontender. Heart has regular rate and rhythm without murmur. Abdomen is soft, flat, nontender without masses or hepatosplenomegaly and peristalsis is normoactive. Extremities have no cyanosis or edema, full range of motion is present. Skin is warm and dry without rash. Neurologic: Mental status is normal. Eyes are deviated to the right. He has a superior visual field cut in both eyes. There are no motor or sensory deficits. Strength is 5/5 in both arms and both legs. There is no pronator drift.  ED Treatments / Results  Labs (all labs ordered are listed, but only abnormal results are displayed) Labs Reviewed  I-STAT CHEM 8, ED - Abnormal; Notable for the following:       Result Value   Potassium 3.4 (*)    Glucose, Bld 107 (*)    Calcium, Ion 1.08 (*)    Hemoglobin 12.6 (*)    HCT 37.0 (*)    All other components within normal limits  PROTIME-INR  APTT  CBC  DIFFERENTIAL  COMPREHENSIVE METABOLIC PANEL  HEPARIN LEVEL (UNFRACTIONATED)  Randolm Idol, ED  CBG MONITORING, ED    EKG AFSHIN, CHRYSTAL OO:[id number] 03-Jan-2016 18:00:27 [facility]/ED ROUTINE RECORD Sinus tachycardia Premature ventricular complexes Borderline prolonged PR interval Consider left atrial enlargement Right bundle branch block Probable inferior infarct, recent When compared with ECG of 10/05/2015, HEART RATE has increased Confirmed by Roxanne Mins MD, Bre [place] ([id number]) on 01/03/2016 6:09:01 PM 50m/s 140mmV _0  9.0.4 CID: [id number] By: DADelora FuelD Vent. rate 100 BPM PR interval * ms QRS duration 147 ms QT/QTc 383/494 ms P-R-T axes 88 -73 30 [id number]) Male Caucasian Room:D33C Loc:11 Technician: [id number] ind Radiology Ct Head Code Stroke W/o Cm  Result Date: 01/03/2016 CLINICAL DATA:  Code stroke.  Visual loss and right-sided gaze. EXAM: CT HEAD WITHOUT CONTRAST TECHNIQUE: Contiguous axial images were obtained from the base of the skull through the vertex without intravenous contrast. COMPARISON:  None. FINDINGS: Brain: There is no evidence of acute cortical infarct, intracranial hemorrhage, mass, midline shift, or extra-axial fluid collection. There is asymmetric hypoattenuation involving the posterior and superior aspects of the right lentiform nucleus with asymmetric white matter low density in the right corona radiata/ external capsule. Subcortical and deep cerebral white matter hypodensities are nonspecific but compatible with moderate chronic small vessel ischemic disease. There is moderate cerebral atrophy. A punctate cortical calcification is noted in the right occipital lobe. Vascular: No hyperdense vessel. Calcified atherosclerosis at the skullbase. Skull: No fracture or focal osseous lesion. Sinuses/Orbits: Evidence of chronic bilateral maxillary sinusitis. Partially visualized left maxillary sinus mucosal thickening and small bilateral maxillary sinus fluid/ secretions which may indicate a superimposed acute  component. Visualized mastoid air cells are clear. Prior bilateral cataract extraction. Other: None. ASPECTS ([facility] Early CT Score) - Ganglionic level infarction (caudate, lentiform nuclei, internal capsule, insula, M1-M3 cortex): 6 (1 point subtracted for right lentiform nucleus) - Supraganglionic infarction (M4-M6 cortex): 3 Total score (0-10 with 10 being normal): 9 IMPRESSION: 1. Possible age-indeterminate infarct in the right lentiform nucleus. No acute intracranial hemorrhage. 2. ASPECTS is 9. 3. Moderate chronic small vessel ischemic disease. These results were called by telephone at the time of interpretation on 01/03/2016 at 6:12 pm to Dr. CaArmida Sanswho verbally acknowledged these results. Electronically Signed   By: AlLogan Bores.D.   On: 01/03/2016 18:17  Procedures Procedures (including critical care time) CRITICAL CARE Performed by: PJPET,KKOEC Total critical care time: 55 minutes Critical care time was exclusive of separately billable procedures and treating other patients. Critical care was necessary to treat or prevent imminent or life-threatening deterioration. Critical care was time spent personally by me on the following activities: development of treatment plan with patient and/or surrogate as well as nursing, discussions with consultants, evaluation of patient's response to treatment, examination of patient, obtaining history from patient or surrogate, ordering and performing treatments and interventions, ordering and review of laboratory studies, ordering and review of radiographic studies, pulse oximetry and re-evaluation of patient's condition.  Medications Ordered in ED Medications  LORazepam (ATIVAN) injection 0.5 mg (0.5 mg Intravenous Given 01/03/16 1810)  LORazepam (ATIVAN) injection 0.5 mg (0.5 mg Intravenous Given 01/03/16 1836)     Initial Impression / Assessment and Plan / ED Course  I have reviewed the triage vital signs and the nursing  notes.  Pertinent labs & imaging results that were available during my care of the patient were reviewed by me and considered in my medical decision making (see chart for details).  Clinical Course    Probable stroke with visual field cut and case preference. However, this cannot easily be consolidated into a single lesion. Suspect embolic stroke from underlying atrial fibrillation. He will be sent for MRI. Old records are reviewed, and he has no relevant past visits.  Patient was unable to tolerate MRI scan. He gets dyspneic on laying flat. Attempted sedation with low-dose lorazepam without any improvement in his ability to tolerate laying flat. Case is discussed with Dr. Armida Sans of neurology service. He will be admitted to the hospitalists service. CT angiogram will be obtained of neck and head. Case is discussed with Dr. Hal Hope of [facility] hospital[facility] who agrees to admit the patient under observation status.  Final Clinical Impressions(s) / ED Diagnoses   Final diagnoses:  Cerebrovascular accident (CVA), unspecified mechanism ([place])  Chronic anticoagulation    New Prescriptions New Prescriptions   No medications on file     Delora Fuel, MD 95/07/22 [id number]

## 2016-01-03 NOTE — ED Notes (Signed)
Pt with R sided gaze, unable to look straight or to the left. No other deficits, NIH score of 2. A&Ox4, speaks in complete sentences. CT negative, unable to perform MRI due to pt being unable to tolerate having his head down. Pt on 6L O2 [place] baseline. Unable to tPA due to last dose of Eliquis within 48 hours. 18 G in L AC. Family at bedside. Pt failed swallow screen, unable to drink water without coughing. ST eval ordered for tomorrow. R sided gaze still present. Ambulatory at baseline. Has not ambulated while in the ED.

## 2016-01-03 NOTE — ED Notes (Signed)
Pt transported to CT ?

## 2016-01-03 NOTE — ED Notes (Signed)
Admitting provider at bedside.

## 2016-01-03 NOTE — Consult Note (Addendum)
Referring Physician: ED    Chief Complaint: right gaze preference  HPI:                                                                                                                                         [name] is an 82 y.o. male with a past medical history significant for atrial fibrillation on xarelto (did not take it today), HLD, CAD, brought in by EMS as a code stroke due to acute onset right gaze preference. He was in his usual state of health ,watching a football game in the TV when suddenly noticed that he couldn't move his eyes to the left. Said that he got scared but denies associated visual loss, HA, vertigo, double vision, focal weakness or numbness, slurred speech, confusion, or language impairment. NIHSS 3. CT head without definitive acute abnormality but question of hypodensity around right lentiform nucleus, ASPECTS 9   Date last known well: 01/03/16 Time last known well: 3 PM tPA Given: no, xarelto less,than 24 h ago NIHSS: 3 MRS: 0  Past Medical History:  Diagnosis Date  . AF (atrial fibrillation) ([place])    after PTX 2012  . Allergy   . Arthritis    h/o R shoulder injection per ortho  . Back pain   . BPH (benign prostatic hyperplasia)   . COPD (chronic obstructive pulmonary disease) ([place])   . Coronary artery disease   . Elevated MCV   . Erectile dysfunction   . GERD (gastroesophageal reflux disease)   . HSV-1 (herpes simplex virus 1) infection   . Hyperlipidemia   . Spontaneous pneumothorax 09/04/2010   [name]    Past Surgical History:  Procedure Laterality Date  . CATARACT EXTRACTION, BILATERAL    . CHEST TUBE PLACEMENT  09/04/2010  . VIDEO ASSISTED THORACOSCOPY  12/15/2010   Procedure: VIDEO ASSISTED THORACOSCOPY;  Surgeon: Melrose Nakayama, MD;  Location: [facility];  Service: Thoracic;  Laterality: Left;  blebectomy with pleural abrasion  . VIDEO ASSISTED THORACOSCOPY Right 06/28/2013   Procedure: VIDEO ASSISTED THORACOSCOPY with  stapling of blebs;  Surgeon: Melrose Nakayama, MD;  Location: [facility];  Service: Thoracic;  Laterality: Right;  (R) VATS W/BLEBECTOMY    Family History  Problem Relation Age of Onset  . Diabetes Mother   . Peripheral Artery Disease Father   . Colon cancer Brother     possible dx, not certain  . Hypertension    . Stroke Brother   . Prostate cancer Neg Hx   . Heart attack Neg Hx    Social History:  reports that he quit smoking about 16 years ago. His smoking use included Cigarettes. He has a 50.00 pack-year smoking history. He has never used smokeless tobacco. He reports that he does not drink alcohol or use drugs.  Allergies:  Allergies  Allergen Reactions  . Rosuvastatin     REACTION: myalgias at  50m/day dose    Medications:                                                                                                                           I have reviewed the patient's current medications.  ROS:                                                                                                                                       History obtained from chart review and the patient  General ROS: negative for - chills, fatigue, fever, night sweats, weight gain or weight loss Psychological ROS: negative for - behavioral disorder, hallucinations, memory difficulties, mood swings or suicidal ideation Ophthalmic ROS: negative for - blurry vision, double vision, eye pain or loss of vision ENT ROS: negative for - epistaxis, nasal discharge, oral lesions, sore throat, tinnitus or vertigo Allergy and Immunology ROS: negative for - hives or itchy/watery eyes Hematological and Lymphatic ROS: negative for - bleeding problems, bruising or swollen lymph nodes Endocrine ROS: negative for - galactorrhea, hair pattern changes, polydipsia/polyuria or temperature intolerance Respiratory ROS: negative for - cough, hemoptysis, shortness of breath or wheezing Cardiovascular ROS: negative for -  chest pain, dyspnea on exertion, edema or irregular heartbeat Gastrointestinal ROS: negative for - abdominal pain, diarrhea, hematemesis, nausea/vomiting or stool incontinence Genito-Urinary ROS: negative for - dysuria, hematuria, incontinence or urinary frequency/urgency Musculoskeletal ROS: negative for - joint swelling or muscular weakness Neurological ROS: as noted in HPI Dermatological ROS: negative for rash and skin lesion changes  Physical exam:  Constitutional: well developed, pleasant male in no apparent distress. Blood pressure 150/87, pulse 98, resp. rate 23, height _0  (1.753 m), weight 77.3 kg (170 lb 6.7 oz), SpO2 100 %. Eyes: no jaundice or exophthalmos.  Head: normocephalic. Neck: supple, no bruits, no JVD. Cardiac: no murmurs. Lungs: clear. Abdomen: soft, no tender, no mass. Extremities: no edema, clubbing, or cyanosis.  Skin: no rash  Neurologic Examination:  General: NAD Mental Status: Alert, oriented, thought content appropriate.  Speech fluent without evidence of aphasia.  Able to follow 3 step commands without difficulty. Cranial Nerves: II:  Right upper visual field impaired, pupils equal, round, reactive to light and accommodation III,IV, VI: ptosis not present, right gaze preference V,VII: smile symmetric, facial light touch sensation normal bilaterally VIII: hearing normal bilaterally IX,X: uvula rises symmetrically XI: bilateral shoulder shrug XII: midline tongue extension without atrophy or fasciculations  Motor: Right : Upper extremity   5/5    Left:     Upper extremity   5/5  Lower extremity   5/5     Lower extremity   5/5 Tone and bulk:normal tone throughout; no atrophy noted Sensory: Pinprick and light touch intact throughout, bilaterally Deep Tendon Reflexes:  Right: Upper Extremity   Left: Upper extremity   biceps (C-5 to C-6) 2/4   biceps (C-5  to C-6) 2/4 tricep (C7) 2/4    triceps (C7) 2/4 Brachioradialis (C6) 2/4  Brachioradialis (C6) 2/4  Lower Extremity Lower Extremity  quadriceps (L-2 to L-4) 2/4   quadriceps (L-2 to L-4) 2/4 Achilles (S1) 2/4   Achilles (S1) 2/4  Plantars: Right: downgoing   Left: downgoing Cerebellar: normal finger-to-nose,  normal heel-to-shin test Gait:  No tested due to multiple leads    Results for orders placed or performed during the hospital encounter of 01/03/16 (from the past 48 hour(s))  CBC     Status: Abnormal   Collection Time: 01/03/16  5:38 PM  Result Value Ref Range   WBC 13.6 (H) 4.0 - 10.5 K/uL   RBC 3.82 (L) 4.22 - 5.81 MIL/uL   Hemoglobin 13.8 13.0 - 17.0 g/dL   HCT 39.2 39.0 - 52.0 %   MCV 102.6 (H) 78.0 - 100.0 fL   MCH 36.1 (H) 26.0 - 34.0 pg   MCHC 35.2 30.0 - 36.0 g/dL   RDW 13.2 11.5 - 15.5 %   Platelets 271 150 - 400 K/uL  Differential     Status: Abnormal   Collection Time: 01/03/16  5:38 PM  Result Value Ref Range   Neutrophils Relative % 81 %   Neutro Abs 10.9 (H) 1.7 - 7.7 K/uL   Lymphocytes Relative 12 %   Lymphs Abs 1.6 0.7 - 4.0 K/uL   Monocytes Relative 7 %   Monocytes Absolute 1.0 0.1 - 1.0 K/uL   Eosinophils Relative 0 %   Eosinophils Absolute 0.0 0.0 - 0.7 K/uL   Basophils Relative 0 %   Basophils Absolute 0.0 0.0 - 0.1 K/uL  I-stat troponin, ED     Status: None   Collection Time: 01/03/16  5:44 PM  Result Value Ref Range   Troponin i, poc 0.00 0.00 - 0.08 ng/mL   Comment 3            Comment: Due to the release kinetics of cTnI, a negative result within the first hours of the onset of symptoms does not rule out myocardial infarction with certainty. If myocardial infarction is still suspected, repeat the test at appropriate intervals.   I-Stat Chem 8, ED     Status: Abnormal   Collection Time: 01/03/16  5:46 PM  Result Value Ref Range   Sodium 143 135 - 145 mmol/L   Potassium 3.4 (L) 3.5 - 5.1 mmol/L   Chloride 102 101 - 111 mmol/L    BUN 18 6 - 20 mg/dL   Creatinine, Ser 1.10 0.61 - 1.24 mg/dL   Glucose, Bld 107 (  H) 65 - 99 mg/dL   Calcium, Ion 1.08 (L) 1.15 - 1.40 mmol/L   TCO2 26 0 - 100 mmol/L   Hemoglobin 12.6 (L) 13.0 - 17.0 g/dL   HCT 37.0 (L) 39.0 - 52.0 %   Ct Head Code Stroke W/o Cm  Result Date: 01/03/2016 CLINICAL DATA:  Code stroke.  Visual loss and right-sided gaze. EXAM: CT HEAD WITHOUT CONTRAST TECHNIQUE: Contiguous axial images were obtained from the base of the skull through the vertex without intravenous contrast. COMPARISON:  None. FINDINGS: Brain: There is no evidence of acute cortical infarct, intracranial hemorrhage, mass, midline shift, or extra-axial fluid collection. There is asymmetric hypoattenuation involving the posterior and superior aspects of the right lentiform nucleus with asymmetric white matter low density in the right corona radiata/ external capsule. Subcortical and deep cerebral white matter hypodensities are nonspecific but compatible with moderate chronic small vessel ischemic disease. There is moderate cerebral atrophy. A punctate cortical calcification is noted in the right occipital lobe. Vascular: No hyperdense vessel. Calcified atherosclerosis at the skullbase. Skull: No fracture or focal osseous lesion. Sinuses/Orbits: Evidence of chronic bilateral maxillary sinusitis. Partially visualized left maxillary sinus mucosal thickening and small bilateral maxillary sinus fluid/ secretions which may indicate a superimposed acute component. Visualized mastoid air cells are clear. Prior bilateral cataract extraction. Other: None. ASPECTS [facility] Stroke Program Early CT Score) - Ganglionic level infarction (caudate, lentiform nuclei, internal capsule, insula, M1-M3 cortex): 6 (1 point subtracted for right lentiform nucleus) - Supraganglionic infarction (M4-M6 cortex): 3 Total score (0-10 with 10 being normal): 9 IMPRESSION: 1. Possible age-indeterminate infarct in the right lentiform nucleus. No  acute intracranial hemorrhage. 2. ASPECTS is 9. 3. Moderate chronic small vessel ischemic disease. These results were called by telephone at the time of interpretation on 01/03/2016 at 6:12 pm to Dr. Armida Sans, who verbally acknowledged these results. Electronically Signed   By: Logan Bores M.D.   On: 01/03/2016 18:17     Assessment: 82 y.o. male with atrial fibrillation on xarelto, last dose less than 24 h ago, presents with acute onset right gaze preference and right upper VF impaired on neuro-exam. NIHSS 3, CT head without acute abnormality. Suspect right frontal infarct. Did not treat with iv tPA as hospital policy for giving tPA in patient s taking xarelto requires last dose of xarelto 48 h prior to tPA. Presentation is little bit perplexing, as his clinical presentation is not indicative of a large right MCA syndrome that could explain the right gaze preference along with a right upper visual field deficit. Wonder if multifocal embolism involving areas of the right MCA and left PCA. Attempted to get MRI but patient unable to stay flat. Will get CTA brain and neck. Admit and complete stroke work up. Stroke team will follow up tomorrow. Stroke Risk Factors - atrial fibrillation, hyperlipidemia and age, CAD  Plan: 1. HgbA1c, fasting lipid panel 2. MRI brain 3. Echocardiogram 4. Carotid dopplers 5. Prophylactic therapy-Anticoagulation: xarelto 6. Risk factor modification 7. Telemetry monitoring 8. Frequent neuro checks 9. PT/OT SLP  Dorian Pod, MD [facility] [phone]  01/03/2016, 6:26 PM

## 2016-01-03 NOTE — ED Notes (Signed)
Family in waiting room

## 2016-01-04 ENCOUNTER — Observation Stay (HOSPITAL_BASED_OUTPATIENT_CLINIC_OR_DEPARTMENT_OTHER)
Admit: 2016-01-04 | Discharge: 2016-01-04 | Disposition: A | Discharge status: Home / Self Care | Payer: Medicare Other | Attending: Neurology | Admitting: Neurology

## 2016-01-04 ENCOUNTER — Encounter (HOSPITAL_COMMUNITY): Payer: Self-pay

## 2016-01-04 ENCOUNTER — Encounter: Payer: Self-pay | Admitting: Respiratory Therapy

## 2016-01-04 ENCOUNTER — Observation Stay (HOSPITAL_COMMUNITY): Payer: Medicare Other

## 2016-01-04 ENCOUNTER — Telehealth: Payer: Self-pay | Admitting: Respiratory Therapy

## 2016-01-04 DIAGNOSIS — J479 Bronchiectasis, uncomplicated: Secondary | ICD-10-CM

## 2016-01-04 DIAGNOSIS — I6312 Cerebral infarction due to embolism of basilar artery: Secondary | ICD-10-CM

## 2016-01-04 DIAGNOSIS — J449 Chronic obstructive pulmonary disease, unspecified: Secondary | ICD-10-CM

## 2016-01-04 DIAGNOSIS — I639 Cerebral infarction, unspecified: Secondary | ICD-10-CM

## 2016-01-04 DIAGNOSIS — R531 Weakness: Secondary | ICD-10-CM | POA: Diagnosis not present

## 2016-01-04 DIAGNOSIS — I480 Paroxysmal atrial fibrillation: Secondary | ICD-10-CM | POA: Diagnosis not present

## 2016-01-04 DIAGNOSIS — J9611 Chronic respiratory failure with hypoxia: Secondary | ICD-10-CM

## 2016-01-04 DIAGNOSIS — I739 Peripheral vascular disease, unspecified: Secondary | ICD-10-CM | POA: Diagnosis not present

## 2016-01-04 DIAGNOSIS — E784 Other hyperlipidemia: Secondary | ICD-10-CM | POA: Diagnosis not present

## 2016-01-04 LAB — COMPREHENSIVE METABOLIC PANEL
ALK PHOS: 56 U/L (ref 38–126)
ALT: 10 U/L — AB (ref 17–63)
AST: 14 U/L — AB (ref 15–41)
Albumin: 3.4 g/dL — ABNORMAL LOW (ref 3.5–5.0)
Anion gap: 5 (ref 5–15)
BUN: 11 mg/dL (ref 6–20)
CALCIUM: 8.6 mg/dL — AB (ref 8.9–10.3)
CO2: 27 mmol/L (ref 22–32)
CREATININE: 0.97 mg/dL (ref 0.61–1.24)
Chloride: 108 mmol/L (ref 101–111)
Glucose, Bld: 103 mg/dL — ABNORMAL HIGH (ref 65–99)
Potassium: 3.4 mmol/L — ABNORMAL LOW (ref 3.5–5.1)
SODIUM: 140 mmol/L (ref 135–145)
Total Bilirubin: 0.9 mg/dL (ref 0.3–1.2)
Total Protein: 6.2 g/dL — ABNORMAL LOW (ref 6.5–8.1)

## 2016-01-04 LAB — RAPID URINE DRUG SCREEN, HOSP PERFORMED
AMPHETAMINES: NOT DETECTED
BARBITURATES: NOT DETECTED
BENZODIAZEPINES: NOT DETECTED
COCAINE: NOT DETECTED
OPIATES: NOT DETECTED
TETRAHYDROCANNABINOL: NOT DETECTED

## 2016-01-04 LAB — CBC
HCT: 36.7 % — ABNORMAL LOW (ref 39.0–52.0)
Hemoglobin: 12.7 g/dL — ABNORMAL LOW (ref 13.0–17.0)
MCH: 35.2 pg — AB (ref 26.0–34.0)
MCHC: 34.6 g/dL (ref 30.0–36.0)
MCV: 101.7 fL — AB (ref 78.0–100.0)
PLATELETS: 267 10*3/uL (ref 150–400)
RBC: 3.61 MIL/uL — AB (ref 4.22–5.81)
RDW: 13.2 % (ref 11.5–15.5)
WBC: 11 10*3/uL — ABNORMAL HIGH (ref 4.0–10.5)

## 2016-01-04 LAB — LIPID PANEL
CHOL/HDL RATIO: 5.3 ratio
CHOLESTEROL: 192 mg/dL (ref 0–200)
HDL: 36 mg/dL — ABNORMAL LOW (ref >40)
LDL Cholesterol: 138 mg/dL — ABNORMAL HIGH (ref 0–99)
Triglycerides: 90 mg/dL (ref <150)
VLDL: 18 mg/dL (ref 0–40)

## 2016-01-04 MED ORDER — POTASSIUM CHLORIDE CRYS ER 20 MEQ PO TBCR
40.0000 meq | EXTENDED_RELEASE_TABLET | Freq: Once | ORAL | Status: AC
  Administered 2016-01-04: 40 meq via ORAL
  Filled 2016-01-04: qty 2

## 2016-01-04 MED ORDER — RIVAROXABAN 15 MG PO TABS
15.0000 mg | ORAL_TABLET | Freq: Every day | ORAL | Status: DC
  Filled 2016-01-04: qty 1

## 2016-01-04 MED ORDER — PRAVASTATIN SODIUM 40 MG PO TABS: 40.0000 mg | ORAL_TABLET | Freq: Every day | ORAL | Status: DC

## 2016-01-04 MED ORDER — RIVAROXABAN 15 MG PO TABS: 15.0000 mg | ORAL_TABLET | Freq: Every day | ORAL | Status: DC

## 2016-01-04 MED ORDER — RIVAROXABAN 20 MG PO TABS: 20.0000 mg | ORAL_TABLET | Freq: Every day | ORAL | Status: DC

## 2016-01-04 MED ORDER — ENOXAPARIN SODIUM 80 MG/0.8ML ~~LOC~~ SOLN
80.0000 mg | Freq: Once | SUBCUTANEOUS | Status: AC
  Administered 2016-01-04: 80 mg via SUBCUTANEOUS
  Filled 2016-01-04: qty 0.8

## 2016-01-04 NOTE — Progress Notes (Signed)
OT Cancellation Note  Patient Details Name: [name] MRN: [id number] DOB: November 29, 1928   Cancelled Treatment:    Reason Eval/Treat Not Completed: Fatigue/lethargy limiting ability to participate.Pt refused x 2 attempts wanting to take a nap. Will follow.  Malka So 01/04/2016, 3:47 PM

## 2016-01-04 NOTE — Procedures (Signed)
ELECTROENCEPHALOGRAM REPORT  Date of Study: 01/04/2016  Patient's Name: [name] [facility] MRN: [id number] Date of Birth: November 18, 1928  Referring Provider: Rosalin Hawking, MD  Clinical History: 82 year old man admitted for stroke workup and right gaze preference.  Medications: acetaminophen (TYLENOL) tablet 650 mg  albuterol (PROVENTIL) (2.5 MG/3ML) 0.083% nebulizer solution 3 mL  enoxaparin (LOVENOX) injection 80 mg  fluticasone (FLONASE) 50 MCG/ACT nasal spray 2 spray  metoprolol tartrate (LOPRESSOR) tablet 12.5 mg  multivitamin (PROSIGHT) tablet 1 tablet  pantoprazole (PROTONIX) EC tablet 40 mg senna-docusate (Senokot-S) tablet 1 tablet  tamsulosin (FLOMAX) capsule 0.8 mg  tiotropium (SPIRIVA) inhalation capsule 18 mcg  traMADol (ULTRAM) tablet 50 mg  traZODone (DESYREL) tablet 200 mg   Technical Summary: A multichannel digital EEG recording measured by the international 10-20 system with electrodes applied with paste and impedances below 5000 ohms performed in our laboratory with EKG monitoring in a mostly drowsy and asleep patient.  Hyperventilation and photic stimulation were not performed.  The digital EEG was referentially recorded, reformatted, and digitally filtered in a variety of bipolar and referential montages for optimal display.    Description: The patient is awake but mostly drowsy, as well as asleep during the recording.  During maximal wakefulness, there is a symmetric, medium voltage 8 Hz posterior dominant rhythm that attenuates with eye opening.  The record is symmetric.  During drowsiness and sleep, there is an increase in theta slowing of the background.  Vertex waves and symmetric sleep spindles were seen.  There were no epileptiform discharges or electrographic seizures seen.    EKG lead was unremarkable.  Impression: This awake but mostly drowsy and asleep EEG is normal.    Clinical Correlation: A normal EEG does not exclude a clinical diagnosis of epilepsy.   If further clinical questions remain, prolonged EEG may be helpful.  Clinical correlation is advised.  Metta Clines, DO

## 2016-01-04 NOTE — Progress Notes (Signed)
PT Cancellation Note  Patient Details Name: [name] MRN: [id number] DOB: August 28, 1928   Cancelled Treatment:    Reason Eval/Treat Not Completed: Patient at procedure or test/unavailable. Will check back as schedule allows to complete PT eval.    Thelma Comp 01/04/2016, 8:30 AM   Rolinda Roan, PT, DPT Acute Rehabilitation Services Pager: [phone]

## 2016-01-04 NOTE — Progress Notes (Signed)
Pulmonary Individual Treatment Plan  Patient Details  Name: [name] MRN: [id number] Date of Birth: [birth year] Referring Provider:   Flowsheet Row Pulmonary Rehab from 10/27/2015 in [facility] Cardiac and Pulmonary Rehab  Referring Provider  Simonne Maffucci MD      Initial Encounter Date:  Flowsheet Row Pulmonary Rehab from 10/27/2015 in [facility] Cardiac and Pulmonary Rehab  Date  10/27/15  Referring Provider  Simonne Maffucci MD      Visit Diagnosis: COPD, mild ([place])  Bronchiectasis without complication ([place])  Chronic respiratory failure with hypoxia ([place])  Patient's Home Medications on Admission: No current facility-administered medications for this visit.  No current outpatient prescriptions on file.  Facility-Administered Medications Ordered in Other Visits:    0.9 %  sodium chloride infusion, , Intravenous, Continuous, Rise Patience, MD, Last Rate: 75 mL/hr at 01/03/16 2119   acetaminophen (TYLENOL) tablet 650 mg, 650 mg, Oral, Q4H PRN **OR** acetaminophen (TYLENOL) solution 650 mg, 650 mg, Per Tube, Q4H PRN **OR** acetaminophen (TYLENOL) suppository 650 mg, 650 mg, Rectal, Q4H PRN, Rise Patience, MD   albuterol (PROVENTIL) (2.5 MG/3ML) 0.083% nebulizer solution 3 mL, 3 mL, Inhalation, Q4H PRN, Rise Patience, MD   enoxaparin (LOVENOX) injection 80 mg, 80 mg, Subcutaneous, Q12H, Assunta Found Stone, RPH, 80 mg at 01/04/16 0047   fluticasone (FLONASE) 50 MCG/ACT nasal spray 2 spray, 2 spray, Each Nare, Daily PRN, Rise Patience, MD   metoprolol tartrate (LOPRESSOR) tablet 12.5 mg, 12.5 mg, Oral, PRN, Rise Patience, MD   multivitamin (PROSIGHT) tablet 1 tablet, 1 tablet, Oral, Daily, Rise Patience, MD   pantoprazole (PROTONIX) EC tablet 40 mg, 40 mg, Oral, Daily, Rise Patience, MD   senna-docusate (Senokot-S) tablet 1 tablet, 1 tablet, Oral, QHS PRN, Rise Patience, MD   tamsulosin (FLOMAX) capsule 0.8 mg, 0.8 mg, Oral, Daily,  Rise Patience, MD   tiotropium [facility]) inhalation capsule 18 mcg, 18 mcg, Inhalation, Daily, Rise Patience, MD   traMADol Veatrice Bourbon) tablet 50 mg, 50 mg, Oral, Q12H PRN, Rise Patience, MD   traZODone (DESYREL) tablet 200 mg, 200 mg, Oral, QHS, Rise Patience, MD  Past Medical History: Past Medical History:  Diagnosis Date   AF (atrial fibrillation) ([place])    after PTX 2012   Allergy    Arthritis    h/o R shoulder injection per ortho   Back pain    BPH (benign prostatic hyperplasia)    COPD (chronic obstructive pulmonary disease) (HCC)    Coronary artery disease    Elevated MCV    Erectile dysfunction    GERD (gastroesophageal reflux disease)    HSV-1 (herpes simplex virus 1) infection    Hyperlipidemia    Spontaneous pneumothorax 09/04/2010   [name]    Tobacco Use: History  Smoking Status   Former Smoker   Packs/day: 1.00   Years: 50.00   Types: Cigarettes   Quit date: 04/14/1999  Smokeless Tobacco   Never Used    Labs: Recent Review Flowsheet Data    Labs for ITP Cardiac and Pulmonary Rehab Latest Ref Rng & Units 06/29/2013 05/19/2014 09/15/2014 11/26/2015 01/03/2016   Cholestrol 0 - 200 mg/dL - 188 - 199 -   LDLCALC 0 - 99 mg/dL - 132(H) - 124(H) -   LDLDIRECT mg/dL - - - - -   HDL >39.00 mg/dL - 31.50(L) - 40.60 -   Trlycerides 0.0 - 149.0 mg/dL - 124.0 - 172.0(H) -   Hemoglobin A1c 4.6 - 6.5 % - -  5.7 5.4 -   PHART 7.350 - 7.450 7.420 - - - -   PCO2ART 35.0 - 45.0 mmHg 36.3 - - - -   HCO3 20.0 - 24.0 mEq/L 23.1 - - - -   TCO2 0 - 100 mmol/L 24.2 - - - 26   ACIDBASEDEF 0.0 - 2.0 mmol/L 0.8 - - - -   O2SAT % 93.9 - - - -       ADL UCSD:     Pulmonary Assessment Scores    Row Name 10/27/15 1435 12/23/15 1317 12/30/15 0753     ADL UCSD   ADL Phase Entry Mid Mid   SOB Score total 77 91  --   Rest 0 1  --   Walk 3 3  --   Stairs 4 5  --   Bath 5 4  --   Dress 3 4  --   Shop 3 4  --      Pulmonary  Function Assessment:     Pulmonary Function Assessment - 10/27/15 1434      Initial Spirometry Results   FVC% 102 %   FEV1% 79 %   FEV1/FVC Ratio 54   Comments Test done 09/11/15     Post Bronchodilator Spirometry Results   FVC% 103 %   FEV1% 86 %   FEV1/FVC Ratio 58     Breath   Bilateral Breath Sounds Clear;Decreased   Shortness of Breath Yes;Limiting activity;Fear of Shortness of Breath      Exercise Target Goals:    Exercise Program Goal: Individual exercise prescription set with THRR, safety & activity barriers. Participant demonstrates ability to understand and report RPE using BORG scale, to self-measure pulse accurately, and to acknowledge the importance of the exercise prescription.  Exercise Prescription Goal: Starting with aerobic activity 30 plus minutes a day, 3 days per week for initial exercise prescription. Provide home exercise prescription and guidelines that participant acknowledges understanding prior to discharge.  Activity Barriers & Risk Stratification:     Activity Barriers & Cardiac Risk Stratification - 10/27/15 1433      Activity Barriers & Cardiac Risk Stratification   Activity Barriers Shortness of Breath;Deconditioning;Joint Problems   Cardiac Risk Stratification Moderate      6 Minute Walk:     6 Minute Walk    Row Name 10/27/15 1438 12/23/15 1318       6 Minute Walk   Phase Initial Mid Program    Distance 220 feet 460 feet    Walk Time 2.25 minutes 4.27 minutes    # of Rest Breaks 2  12 sec, 3:33 5  20 sec, 10 sec, 24 sec, 20 sec, 30 sec    MPH 1.11 1.22    METS 1.84 1.9    RPE 13 13    Perceived Dyspnea  3 4    VO2 Peak 1.33 3.36    Symptoms No Yes (comment)    Comments  -- SOB    Resting HR 81 bpm 88 bpm    Resting BP 134/64 126/70    Max Ex. HR 89 bpm 113 bpm    Max Ex. BP 136/74 134/66    2 Minute Post BP 136/64 126/64      Interval HR   Baseline HR 81 88    1 Minute HR 96 115    2 Minute HR 98 107    3 Minute  HR 89 112    4 Minute HR 76 88    5  Minute HR 68 106    6 Minute HR 87 106    2 Minute Post HR 79 75    Interval Heart Rate? Yes Yes      Interval Oxygen   Interval Oxygen? Yes Yes    Baseline Oxygen Saturation % 93 % 93 %    Baseline Liters of Oxygen 5 L  pulsed 5 L  pulsed    1 Minute Oxygen Saturation % 88 %  rest :58-1:10 SOB 85 %  84% 1:03-1:23    1 Minute Liters of Oxygen 5 L 6 L  continuous    2 Minute Oxygen Saturation % 81 %  at 1:22 dropped to 84% 89 %  rest 2:18-228, 84% 2:51-3:15    2 Minute Liters of Oxygen 5 L 6 L    3 Minute Oxygen Saturation % 83 %  began seated rest at 3 min 85 %    3 Minute Liters of Oxygen 5 L 8 L    4 Minute Oxygen Saturation % 86 % 92 %  82% 3:38-3:58    4 Minute Liters of Oxygen 5 L 8 L    5 Minute Oxygen Saturation % 88 %  recoved to 90% at 4:55 walking resumed 84 %  84% 5:00-5:30    5 Minute Liters of Oxygen 5 L 8 L    6 Minute Oxygen Saturation % 91 % 93 %    6 Minute Liters of Oxygen 5 L 8 L    2 Minute Post Oxygen Saturation % 91 % 92 %    2 Minute Post Liters of Oxygen 5 L 8 L       Initial Exercise Prescription:     Initial Exercise Prescription - 10/27/15 1400      Date of Initial Exercise RX and Referring Provider   Date 10/27/15   Referring Provider Simonne Maffucci MD     Oxygen   Oxygen Continuous   Liters 4  may need 6L on treadmill     Treadmill   MPH 0.5   Grade 0   Minutes 15   METs 1.4     NuStep   Level 1   Watts --  60-80 spm   Minutes 15  32mn x3   METs 1.5     Arm Ergometer   Level 1   Watts --  25-35 rpm   Minutes 15  5 min x3   METs 1.4     Prescription Details   Frequency (times per week) 3   Duration Progress to 45 minutes of aerobic exercise without signs/symptoms of physical distress     Intensity   THRR 40-80% of Max Heartrate 102-124   Ratings of Perceived Exertion 11-15   Perceived Dyspnea 0-4     Progression   Progression Continue to progress workloads to maintain  intensity without signs/symptoms of physical distress.     Resistance Training   Training Prescription Yes   Weight 2 lbs   Reps 10-12      Perform Capillary Blood Glucose checks as needed.  Exercise Prescription Changes:     Exercise Prescription Changes    Row Name 11/02/15 1500 11/11/15 1500 11/25/15 1500 12/08/15 1500 12/22/15 1500     Exercise Review   Progression _0      Response to Exercise   Blood Pressure (Admit) 120/70 110/62 138/68 152/80 142/70   Blood Pressure (Exercise) 138/82 138/70 128/82 150/76 136/78   Blood Pressure (Exit) 132/70 126/64 138/80 150/82  134/82   Heart Rate (Admit) 79 bpm 78 bpm 76 bpm 70 bpm 86 bpm   Heart Rate (Exercise) 100 bpm 78 bpm 108 bpm 102 bpm 125 bpm   Heart Rate (Exit) 89 bpm 75 bpm 107 bpm 92 bpm 90 bpm   Oxygen Saturation (Admit) 92 % 92 % 92 % 94 % 91 %   Oxygen Saturation (Exercise) 88 % 94 % 89 % 90 % 91 %   Oxygen Saturation (Exit) 98 % 99 % 90 % 98 % 97 %   Rating of Perceived Exertion (Exercise) _0 Perceived Dyspnea (Exercise) _1 Symptoms _2    Duration Progress to 45 minutes of aerobic exercise without signs/symptoms of physical distress Progress to 45 minutes of aerobic exercise without signs/symptoms of physical distress Progress to 45 minutes of aerobic exercise without signs/symptoms of physical distress Progress to 45 minutes of aerobic exercise without signs/symptoms of physical distress Progress to 45 minutes of aerobic exercise without signs/symptoms of physical distress   Intensity _3      Progression   Progression Continue to progress workloads to maintain intensity without signs/symptoms of physical distress. Continue to progress workloads to maintain intensity without signs/symptoms of physical distress. Continue to progress workloads to maintain intensity without signs/symptoms of  physical distress. Continue to progress workloads to maintain intensity without signs/symptoms of physical distress. Continue to progress workloads to maintain intensity without signs/symptoms of physical distress.   Average METs  -- 1.4 1.76 1.63 1.75     Resistance Training   Training Prescription _4    Weight 2 2 lbs 2 lbs 6 lbs 3 lbs   Reps 10-12 10-12 10-12 10-12 10-12     Interval Training   Interval Training _5      Oxygen   Oxygen _6    Liters  -- 4-6 4-6  6L on treadmill 4-6  6L on treadmill 4-6  6L on treadmill     Treadmill   MPH 0.8 0.8 1 0.8 0.8   Grade 0 0 0 0 0   Minutes 15  2/2/2/2/2 7  37mn, 5 min  _7 METs  -- 1.6 1.77 1.6 1.6     NuStep   Level _8 Watts  -- --  63 spm --  60 spm --  65 spm --  61 spm   Minutes _9 METs  -- 1.6 1.6 1.6 1.8     Arm Ergometer   Level _10 Watts  --  -- --  32 rpm --  45 rpm  --   Minutes 7.5 7._11 METs  -- 1 1.9 1.6 1.6      Exercise Comments:     Exercise Comments    Row Name 10/27/15 1448 11/04/15 1356 11/11/15 1536 11/25/15 1540 12/08/15 1551   Exercise Comments Exercise goal is to be able to walk longer without getting SOB First full day of exercise was on Monday!  Patient was oriented to gym and equipment including functions, settings, policies, and procedures.  Patient's individual exercise prescription and treatment plan were reviewed.  All starting workloads were established based on the results of the 6 minute walk test done at initial orientation  visit.  The plan for exercise progression was also introduced and progression will be customized based on patient's performance and goals. Winfred is off to a good start with exercise.   He is doing 0.8 mph on treadmill for 2 min intervals for 8 min total!  We will continue to monitor his progression. Winfred continues to do well with exercise.  He  is starting to become more independent.  We will hold off on home exercise just a little bit longer unitl he is ready.  We will continue to monitor his progression. Winfred is getting more independent with exercise.  He may be ready for home exercise soon.  We will continue to monitor his progression.   [place] Name 12/22/15 1550           Exercise Comments Winfred is doing well with exercise.  We will continue to monitor for progression.          Discharge Exercise Prescription (Final Exercise Prescription Changes):     Exercise Prescription Changes - 12/22/15 1500      Exercise Review   Progression Yes     Response to Exercise   Blood Pressure (Admit) 142/70   Blood Pressure (Exercise) 136/78   Blood Pressure (Exit) 134/82   Heart Rate (Admit) 86 bpm   Heart Rate (Exercise) 125 bpm   Heart Rate (Exit) 90 bpm   Oxygen Saturation (Admit) 91 %   Oxygen Saturation (Exercise) 91 %   Oxygen Saturation (Exit) 97 %   Rating of Perceived Exertion (Exercise) 13   Perceived Dyspnea (Exercise) 3   Symptoms none   Duration Progress to 45 minutes of aerobic exercise without signs/symptoms of physical distress   Intensity THRR unchanged     Progression   Progression Continue to progress workloads to maintain intensity without signs/symptoms of physical distress.   Average METs 1.75     Resistance Training   Training Prescription Yes   Weight 3 lbs   Reps 10-12     Interval Training   Interval Training No     Oxygen   Oxygen Continuous   Liters 4-6  6L on treadmill     Treadmill   MPH 0.8   Grade 0   Minutes 13   METs 1.6     NuStep   Level 5   Watts --  61 spm   Minutes 15   METs 1.8     Arm Ergometer   Level 1   Minutes 4   METs 1.6       Nutrition:  Target Goals: Understanding of nutrition guidelines, daily intake of sodium <1554m, cholesterol <2036m calories 30% from fat and 7% or less from saturated fats, daily to have 5 or more servings of fruits and  vegetables.  Biometrics:     Pre Biometrics - 10/27/15 1448      Pre Biometrics   Height 5' 8.5" (1.74 m)   Weight 168 lb 6.4 oz (76.4 kg)   Waist Circumference 38.5 inches   Hip Circumference 40.5 inches   Waist to Hip Ratio 0.95 %   BMI (Calculated) 25.3       Nutrition Therapy Plan and Nutrition Goals:   Nutrition Discharge: Rate Your Plate Scores:   Psychosocial: Target Goals: Acknowledge presence or absence of depression, maximize coping skills, provide positive support system. Participant is able to verbalize types and ability to use techniques and skills needed for reducing stress and depression.  Initial Review & Psychosocial Screening:  Initial Psych Review & Screening - 10/27/15 Page? Yes   Comments Mr Grieshop states he does have times he misses certain activities, such as golf, that he can no longer perform due to his shortness of breath. He has great support from his wife   .     Barriers   Psychosocial barriers to participate in program There are no identifiable barriers or psychosocial needs.;The patient should benefit from training in stress management and relaxation.     Screening Interventions   Interventions Encouraged to exercise;Program counselor consult      Quality of Life Scores:     Quality of Life - 10/27/15 1447      Quality of Life Scores   Health/Function Pre 20.69 %   Socioeconomic Pre 20.29 %   Psych/Spiritual Pre 21 %   Family Pre 21 %   GLOBAL Pre 20.71 %      PHQ-9: Recent Review Flowsheet Data    Depression screen [facility] 2/9 11/26/2015 10/27/2015 07/25/2014 05/22/2014 06/01/2012   Decreased Interest 0 0 0 0 0   Down, Depressed, Hopeless 0 0 0 0 0   PHQ - 2 Score 0 0 0 0 0   Altered sleeping - 0 - - -   Tired, decreased energy - 1 - - -   Change in appetite - 0 - - -   Feeling bad or failure about yourself  - 0 - - -   Trouble concentrating - 0 - - -   Moving slowly or  fidgety/restless - 0 - - -   Suicidal thoughts - 0 - - -   PHQ-9 Score - 1 - - -   Difficult doing work/chores - Not difficult at all - - -      Psychosocial Evaluation and Intervention:     Psychosocial Evaluation - 11/09/15 1235      Psychosocial Evaluation & Interventions   Interventions Encouraged to exercise with the program and follow exercise prescription;Stress management education;Relaxation education   Comments Counselor met with Mr. Ferrando (Mr. Lemmie Evens) today for initial psychosocial evaluation.  He is an 82 year old gentleman who has been married 50 years and has a strong support system with adult children and active involvement in his local church community.  Mr. Lemmie Evens states he sleeps well (with occasional Tylenol at bedtime) and he has a good appetite.  He denies a history or current symptoms of depression or anxiety and states he is typically in a positive mood.  He has minimal stress in his life although he "worries" some about the health of his wife as she "works too hard taking care of me."  Mr. Lemmie Evens has goals to increase his stamin and strength and to breathe better while in this program.        Psychosocial Re-Evaluation:     Psychosocial Re-Evaluation    Row Name 12/08/15 1231 12/28/15 1623           Psychosocial Re-Evaluation   Comments See 30 note 12/08/15 [name] psychosocial assessment reveals no barriers at this time to participation in Pulmonary Rehab.  He  has good family and friend support that encourages Orley to participate in [place] and progress with His goals.  Donzell concerns are monitored, but he  has acknowledge that attending the program has helped to maintain quality life with improved mobility, self-care, and emotional and financial stability.  Amdrew is commended for regular attendance and self-motivation  to improve His pulmonary disease management.        Education: Education Goals: Education classes will be provided on a weekly basis, covering  required topics. Participant will state understanding/return demonstration of topics presented.  Learning Barriers/Preferences:     Learning Barriers/Preferences - 10/27/15 1434      Learning Barriers/Preferences   Learning Barriers None   Learning Preferences None      Education Topics: Initial Evaluation Education: - Verbal, written and demonstration of respiratory meds, RPE/PD scales, oximetry and breathing techniques. Instruction on use of nebulizers and MDIs: cleaning and proper use, rinsing mouth with steroid doses and importance of monitoring MDI activations. Flowsheet Row Pulmonary Rehab from 12/23/2015 in [facility] Cardiac and Pulmonary Rehab  Date  10/27/15  Educator  LB  Instruction Review Code  2- meets goals/outcomes      General Nutrition Guidelines/Fats and Fiber: -Group instruction provided by verbal, written material, models and posters to present the general guidelines for heart healthy nutrition. Gives an explanation and review of dietary fats and fiber. Flowsheet Row Pulmonary Rehab from 12/23/2015 in [facility] Cardiac and Pulmonary Rehab  Date  12/07/15  Educator  CR  Instruction Review Code  2- meets goals/outcomes      Controlling Sodium/Reading Food Labels: -Group verbal and written material supporting the discussion of sodium use in heart healthy nutrition. Review and explanation with models, verbal and written materials for utilization of the food label. Flowsheet Row Pulmonary Rehab from 12/23/2015 in [facility] Cardiac and Pulmonary Rehab  Date  12/14/15  Educator  CR  Instruction Review Code  2- meets goals/outcomes      Exercise Physiology & Risk Factors: - Group verbal and written instruction with models to review the exercise physiology of the cardiovascular system and associated critical values. Details cardiovascular disease risk factors and the goals associated with each risk factor. Flowsheet Row Pulmonary Rehab from 12/23/2015 in [facility] Cardiac and Pulmonary  Rehab  Date  12/02/15  Educator  AS  Instruction Review Code  2- meets goals/outcomes      Aerobic Exercise & Resistance Training: - Gives group verbal and written discussion on the health impact of inactivity. On the components of aerobic and resistive training programs and the benefits of this training and how to safely progress through these programs.   Flexibility, Balance, General Exercise Guidelines: - Provides group verbal and written instruction on the benefits of flexibility and balance training programs. Provides general exercise guidelines with specific guidelines to those with heart or lung disease. Demonstration and skill practice provided.   Stress Management: - Provides group verbal and written instruction about the health risks of elevated stress, cause of high stress, and healthy ways to reduce stress.   Depression: - Provides group verbal and written instruction on the correlation between heart/lung disease and depressed mood, treatment options, and the stigmas associated with seeking treatment. Flowsheet Row Pulmonary Rehab from 12/23/2015 in [facility] Cardiac and Pulmonary Rehab  Date  12/21/15  Educator  [facility]  Instruction Review Code  2- meets goals/outcomes      Exercise & Equipment Safety: - Individual verbal instruction and demonstration of equipment use and safety with use of the equipment.   Infection Prevention: - Provides verbal and written material to individual with discussion of infection control including proper hand washing and proper equipment cleaning during exercise session. Flowsheet Row Pulmonary Rehab from 12/23/2015 in [facility] Cardiac and Pulmonary Rehab  Date  10/27/15  Educator  LB  Instruction Review Code  2- meets goals/outcomes  Falls Prevention: - Provides verbal and written material to individual with discussion of falls prevention and safety. Flowsheet Row Pulmonary Rehab from 12/23/2015 in [facility] Cardiac and Pulmonary Rehab  Date   10/27/15  Educator  LB  Instruction Review Code  2- meets goals/outcomes      Diabetes: - Individual verbal and written instruction to review signs/symptoms of diabetes, desired ranges of glucose level fasting, after meals and with exercise. Advice that pre and post exercise glucose checks will be done for 3 sessions at entry of program.   Chronic Lung Diseases: - Group verbal and written instruction to review new updates, new respiratory medications, new advancements in procedures and treatments. Provide informative websites and "800" numbers of self-education. Flowsheet Row Pulmonary Rehab from 12/23/2015 in [facility] Cardiac and Pulmonary Rehab  Date  12/16/15  Educator  LB  Instruction Review Code  2- meets goals/outcomes      Lung Procedures: - Group verbal and written instruction to describe testing methods done to diagnose lung disease. Review the outcome of test results. Describe the treatment choices: Pulmonary Function Tests, ABGs and oximetry.   Energy Conservation: - Provide group verbal and written instruction for methods to conserve energy, plan and organize activities. Instruct on pacing techniques, use of adaptive equipment and posture/positioning to relieve shortness of breath.   Triggers: - Group verbal and written instruction to review types of environmental controls: home humidity, furnaces, filters, dust mite/pet prevention, HEPA vacuums. To discuss weather changes, air quality and the benefits of nasal washing.   Exacerbations: - Group verbal and written instruction to provide: warning signs, infection symptoms, calling MD promptly, preventive modes, and value of vaccinations. Review: effective airway clearance, coughing and/or vibration techniques. Create an Sports administrator. Flowsheet Row Pulmonary Rehab from 12/23/2015 in [facility] Cardiac and Pulmonary Rehab  Date  12/23/15  Educator  LB  Instruction Review Code  2- meets goals/outcomes      Oxygen: - Individual and  group verbal and written instruction on oxygen therapy. Includes supplement oxygen, available portable oxygen systems, continuous and intermittent flow rates, oxygen safety, concentrators, and Medicare reimbursement for oxygen.   Respiratory Medications: - Group verbal and written instruction to review medications for lung disease. Drug class, frequency, complications, importance of spacers, rinsing mouth after steroid MDI's, and proper cleaning methods for nebulizers. Flowsheet Row Pulmonary Rehab from 12/23/2015 in [facility] Cardiac and Pulmonary Rehab  Date  10/27/15  Educator  LB  Instruction Review Code  2- meets goals/outcomes      AED/CPR: - Group verbal and written instruction with the use of models to demonstrate the basic use of the AED with the basic ABC's of resuscitation.   Breathing Retraining: - Provides individuals verbal and written instruction on purpose, frequency, and proper technique of diaphragmatic breathing and pursed-lipped breathing. Applies individual practice skills. Flowsheet Row Pulmonary Rehab from 12/23/2015 in [facility] Cardiac and Pulmonary Rehab  Date  10/27/15  Educator  [facility]  Instruction Review Code  2- meets goals/outcomes      Anatomy and Physiology of the Lungs: - Group verbal and written instruction with the use of models to provide basic lung anatomy and physiology related to function, structure and complications of lung disease.   Heart Failure: - Group verbal and written instruction on the basics of heart failure: signs/symptoms, treatments, explanation of ejection fraction, enlarged heart and cardiomyopathy.   Sleep Apnea: - Individual verbal and written instruction to review Obstructive Sleep Apnea. Review of risk factors, methods for diagnosing and types of  masks and machines for OSA.   Anxiety: - Provides group, verbal and written instruction on the correlation between heart/lung disease and anxiety, treatment options, and management of  anxiety.   Relaxation: - Provides group, verbal and written instruction about the benefits of relaxation for patients with heart/lung disease. Also provides patients with examples of relaxation techniques. Flowsheet Row Pulmonary Rehab from 12/23/2015 in [facility]  Date  11/18/15  Educator  [facility]  Instruction Review Code  2- Meets goals/outcomes      Knowledge Questionnaire Score:     Knowledge Questionnaire Score - 10/27/15 1434      Knowledge Questionnaire Score   Pre Score 4/10       Core Components/Risk Factors/Patient Goals at Admission:     Personal Goals and Risk Factors at Admission - 10/27/15 1440      Core Components/Risk Factors/Patient Goals on Admission   Sedentary Yes  Walks driveway at home 5 days/week for 1mns   Intervention Provide advice, education, support and counseling about physical activity/exercise needs.;Develop an individualized exercise prescription for aerobic and resistive training based on initial evaluation findings, risk stratification, comorbidities and participant's personal goals.   Expected Outcomes Achievement of increased cardiorespiratory fitness and enhanced flexibility, muscular endurance and strength shown through measurements of functional capacity and personal statement of participant.   Increase Strength and Stamina Yes   Intervention Provide advice, education, support and counseling about physical activity/exercise needs.;Develop an individualized exercise prescription for aerobic and resistive training based on initial evaluation findings, risk stratification, comorbidities and participant's personal goals.   Expected Outcomes Achievement of increased cardiorespiratory fitness and enhanced flexibility, muscular endurance and strength shown through measurements of functional capacity and personal statement of participant.   Improve shortness of breath with ADL's Yes   Intervention Provide education, individualized  exercise plan and daily activity instruction to help decrease symptoms of SOB with activities of daily living.   Expected Outcomes Short Term: Achieves a reduction of symptoms when performing activities of daily living.   Develop more efficient breathing techniques such as purse lipped breathing and diaphragmatic breathing; and practicing self-pacing with activity Yes  [facility]   Intervention Provide education, demonstration and support about specific breathing techniuqes utilized for more efficient breathing. Include techniques such as pursed lipped breathing, diaphragmatic breathing and self-pacing activity.   Expected Outcomes Short Term: Participant will be able to demonstrate and use breathing techniques as needed throughout daily activities.   Increase knowledge of respiratory medications and ability to use respiratory devices properly  Yes  Albuterol MDI; Spacer given; oxygen 2-5l/m   Intervention Provide education and demonstration as needed of appropriate use of medications, inhalers, and oxygen therapy.   Expected Outcomes Short Term: Achieves understanding of medications use. Understands that oxygen is a medication prescribed by physician. Demonstrates appropriate use of inhaler and oxygen therapy.   Lipids Yes   Intervention Provide education and support for participant on nutrition & aerobic/resistive exercise along with prescribed medications to achieve LDL <757m HDL >4049m  Expected Outcomes Short Term: Participant states understanding of desired cholesterol values and is compliant with medications prescribed. Participant is following exercise prescription and nutrition guidelines.;Long Term: Cholesterol controlled with medications as prescribed, with individualized exercise RX and with personalized nutrition plan. Value goals: LDL < 38m66mDL > 40 mg.      Core Components/Risk Factors/Patient Goals Review:      Goals and Risk Factor Review  [place] Name 11/23/15 1439 12/08/15 0640 12/28/15 1614         Core Components/Risk Factors/Patient Goals Review   Personal Goals Review Sedentary;Increase Strength and Stamina;Improve shortness of breath with ADL's;Develop more efficient breathing techniques such as purse lipped breathing and diaphragmatic breathing and practicing self-pacing with activity.;Increase knowledge of respiratory medications and ability to use respiratory devices properly. Sedentary;Increase Strength and Stamina;Improve shortness of breath with ADL's;Increase knowledge of respiratory medications and ability to use respiratory devices properly. Sedentary;Increase Strength and Stamina;Improve shortness of breath with ADL's;Increase knowledge of respiratory medications and ability to use respiratory devices properly.;Develop more efficient breathing techniques such as purse lipped breathing and diaphragmatic breathing and practicing self-pacing with activity.;Lipids     Review Mr Loeber has completed 3 weeks in [facility] and states an improvement with his strength. He has recently started on Mucinex which has improved his sputum production. He has a good understanding of his Albuterol MDI and carries it with him. The spacer he uses at home. His PD's for shortness of breath are 3-4 and does not notice improvement in his breathing, although he does use PLB and adjusts his oxygen to 5l/m at home with activity. Mr Pitzer walks at home his driveway with pacing and use of his home oximeter. Unfortunately he uses intermittent flow with the oxygen, so  his O2Sat's do drop in the 80's - encouraged him to rest and pace himself to keep O2Sat's 88% or greater. Mr Ksiazek is still walking his driveway on his days off from [place], although with the cold weather, he has been walking at [facility]. He recently purchased new sneakers for better support for exercise. His breathing has worsened over the last couple of weeks. He physician has  ordered an EKG.  Mr Araki has had a productive cough, but the sputum has been clear. He does use a flutter valve.  Mr Reiland has progressed his goals on the NS, TM, and AE. He adjusts his oxygen from 4-6l/m with his exercise.  Recently he bought a new pair of sneakers for better support during his exercise. He is using his Albuterol MDI with the aerochamber. His physician has ordered a vibrating vest for mobilization of his secretions. Mr Sperry is compliant with his lipid medication.     Expected Outcomes  -- Continue exercising to increase his strength and help improve shortness of breath. Contact oxygen company for a larger portable tank. Continue to manage his bronchiectasis with the vibrating vest and oxygen.        Core Components/Risk Factors/Patient Goals at Discharge (Final Review):      Goals and Risk Factor Review - 12/28/15 1614      Core Components/Risk Factors/Patient Goals Review   Personal Goals Review Sedentary;Increase Strength and Stamina;Improve shortness of breath with ADL's;Increase knowledge of respiratory medications and ability to use respiratory devices properly.;Develop more efficient breathing techniques such as purse lipped breathing and diaphragmatic breathing and practicing self-pacing with activity.;Lipids   Review Mr Hattabaugh has progressed his goals on the NS, TM, and AE. He adjusts his oxygen from 4-6l/m with his exercise.  Recently he bought a new pair of sneakers for better support during his exercise. He is using his Albuterol MDI with the aerochamber. His physician has ordered a vibrating vest for mobilization of his secretions. Mr Stofko is compliant with his lipid medication.   Expected Outcomes Continue to manage his bronchiectasis with the vibrating vest and oxygen.      ITP Comments:   Comments: 30  day note review

## 2016-01-04 NOTE — Progress Notes (Signed)
EEG Completed; Results Pending  

## 2016-01-04 NOTE — Progress Notes (Signed)
PROGRESS NOTE    [name]  HWY:[id number] DOB: January 05, 1929 DOA: 01/03/2016 PCP: Randall Stain, MD   Outpatient Specialists:     Brief Narrative:  [name] is a 82 y.o. male with history of atrial fibrillation, COPD, CAD, history of spontaneous pneumothorax presents to ER with complaints of patient having blurry vision. Patient's symptoms started around 3 PM after patient woke up from sleep and was watching TV. Patient initially felt some weakness in the lower extremities followed by the visual symptoms. On exam patient has been having right-sided gaze preference. Patient was able to move all extremities without difficulty. Neurologist on-call was consulted. 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 This was followed by CT angiogram of the head and neck since patient was unable to have MRI. Patient is being admitted for further stroke workup. Patient takes Xarelto for A. fib and has not taken his dose today.      Assessment & Plan:   Principal Problem:   Acute embolic stroke ([place]) Active Problems:   HLD (hyperlipidemia)   PAF (paroxysmal atrial fibrillation)    PAD (peripheral artery disease) (HCC)   Stroke (cerebrum) (HCC)   Acute embolic stroke - appreciate neurology consult.  -2-D echo.  -Neuro checks.  -tele -hemoglobin A1c  -lipid panel: LDL 138-- allergy to statin -physical therapy consult. -EEG- normal  Paroxysmal atrial fibrillation - continue metoprolol.  -resume xarelto  COPD - not actively wheezing. Continue home inhalers. -on 6L O2 at home  History of CAD - denies any chest pain.  History of recurrent pneumothorax - denies any shortness of breath- x ray shows small left pleural effusion   Hypokalemia  -replace  DVT prophylaxis:  Fully anticoagulated   Code Status: Full Code   Family Communication: son  Disposition Plan:     Consultants:   neuro     Subjective: No SOB, no  CP On bedside commode  Objective: Vitals:   01/04/16 0030 01/04/16 0230 01/04/16 0423 01/04/16 1019  BP: (!) 148/66 (!) 157/94 (!) 165/80 (!) 163/73  Pulse: 70 68 65 70  Resp: 20 20    Temp: 98.6 F (37 C) 97.9 F (36.6 C) 97.7 F (36.5 C) 98.2 F (36.8 C)  TempSrc: Oral Oral Oral Oral  SpO2: 99% 100% 98% 100%  Weight:      Height:        Intake/Output Summary (Last 24 hours) at 01/04/16 1245 Last data filed at 01/04/16 0800  Gross per 24 hour  Intake                0 ml  Output              300 ml  Net             -300 ml   Filed Weights   01/03/16 1750 01/03/16 2030  Weight: 77.3 kg (170 lb 6.7 oz) 75.4 kg (166 lb 4.8 oz)    Examination:  General exam: Appears calm and comfortable  Respiratory system: Clear to auscultation. Respiratory effort normal. Cardiovascular system: S1 & S2 heard, RRR. No JVD, murmurs, rubs, gallops or clicks. No pedal edema. Gastrointestinal system: Abdomen is nondistended, soft and nontender. No organomegaly or masses felt. Normal bowel sounds heard. Central nervous system: Alert and oriented. No focal neurological deficits.      Data Reviewed: I have personally reviewed following labs and imaging studies  CBC:  Recent Labs Lab 01/03/16 1738 01/03/16 1746  01/04/16 0724  WBC 13.6*  --  11.0*  NEUTROABS 10.9*  --   --   HGB 13.8 12.6* 12.7*  HCT 39.2 37.0* 36.7*  MCV 102.6*  --  101.7*  PLT 271  --  035   Basic Metabolic Panel:  Recent Labs Lab 01/03/16 1738 01/03/16 1746 01/04/16 0724  NA 140 143 140  K 3.5 3.4* 3.4*  CL 105 102 108  CO2 26  --  27  GLUCOSE 115* 107* 103*  BUN _0 CREATININE 1.22 1.10 0.97  CALCIUM 9.0  --  8.6*   GFR: Estimated Creatinine Clearance: 55.7 mL/min (by C-G formula based on SCr of 0.97 mg/dL). Liver Function Tests:  Recent Labs Lab 01/03/16 1738 01/04/16 0724  AST 16 14*  ALT 9* 10*  ALKPHOS 63 56  BILITOT 0.7 0.9  PROT 7.3 6.2*  ALBUMIN 3.9 3.4*   No results for  input(s): LIPASE, AMYLASE in the last 168 hours. No results for input(s): AMMONIA in the last 168 hours. Coagulation Profile:  Recent Labs Lab 01/03/16 1738  INR 1.18   Cardiac Enzymes: No results for input(s): CKTOTAL, CKMB, CKMBINDEX, TROPONINI in the last 168 hours. BNP (last 3 results)  Recent Labs  09/10/15 1436  PROBNP 105.0*   HbA1C: No results for input(s): HGBA1C in the last 72 hours. CBG: No results for input(s): GLUCAP in the last 168 hours. Lipid Profile:  Recent Labs  01/04/16 0724  CHOL 192  HDL 36*  LDLCALC 138*  TRIG 90  CHOLHDL 5.3   Thyroid Function Tests: No results for input(s): TSH, T4TOTAL, FREET4, T3FREE, THYROIDAB in the last 72 hours. Anemia Panel: No results for input(s): VITAMINB12, FOLATE, FERRITIN, TIBC, IRON, RETICCTPCT in the last 72 hours. Urine analysis:    Component Value Date/Time   COLORURINE LT. YELLOW 02/02/2012 1458   APPEARANCEUR CLEAR 02/02/2012 1458   LABSPEC 1.010 02/02/2012 1458   PHURINE 6.0 02/02/2012 1458   GLUCOSEU NEGATIVE 02/02/2012 1458   HGBUR NEGATIVE 02/02/2012 1458   BILIRUBINUR NEGATIVE 02/02/2012 1458   BILIRUBINUR Neg 07/14/2011 1222   KETONESUR NEGATIVE 02/02/2012 1458   PROTEINUR Neg 07/14/2011 1222   PROTEINUR NEGATIVE 12/14/2010 1633   UROBILINOGEN 1.0 02/02/2012 1458   NITRITE NEGATIVE 02/02/2012 1458   LEUKOCYTESUR NEGATIVE 02/02/2012 1458     )No results found for this or any previous visit (from the past 240 hour(s)).    Anti-infectives    None       Radiology Studies: Ct Angio Head W Or Wo Contrast  Result Date: 01/03/2016 CLINICAL DATA:  Acute onset right gaze preference. History of atrial fibrillation. EXAM: CT ANGIOGRAPHY HEAD AND NECK TECHNIQUE: Multidetector CT imaging of the head and neck was performed using the standard protocol during bolus administration of intravenous contrast. Multiplanar CT image reconstructions and MIPs were obtained to evaluate the vascular anatomy.  Carotid stenosis measurements (when applicable) are obtained utilizing NASCET criteria, using the distal internal carotid diameter as the denominator. CONTRAST:  50 mL Isovue 370 COMPARISON:  None. FINDINGS: CTA NECK FINDINGS Aortic arch: Standard branching. Extensive calcified and noncalcified plaque in the aortic arch. No significant arch vessel origins stenosis. Right carotid system: Medial course of the proximal common carotid artery. Moderate to heavy atherosclerotic plaque at the carotid bifurcation and in the proximal ICA, predominantly calcified. No significant stenosis or evidence of dissection. Left carotid system: Medial course of the proximal common carotid artery in the neck. Moderately extensive calcified plaque at the carotid bifurcation with  mixed calcified and noncalcified plaque in the proximal ICA. Proximal ICA narrowing is less than 50%. No evidence of dissection. Vertebral arteries: Patent vertebral arteries with the left minimally larger than the right. Atherosclerotic plaque involving the vertebral artery origins and proximal V1 segments bilaterally without significant stenosis. Skeleton: Moderately advanced lower cervical disc degeneration. Partial ankylosis across the disc spaces at C7-T1 and T1-2. Moderate cervical facet arthrosis. Other neck: Evidence of chronic bilateral maxillary sinusitis with mild mucosal thickening on the left and small volume fluid/ secretions bilaterally which may reflect superimposed acute sinusitis. No neck mass or lymph node enlargement. Upper chest: Advanced centrilobular emphysema. Review of the MIP images confirms the above findings CTA HEAD FINDINGS Anterior circulation: Internal carotid arteries are patent from skullbase to carotid termini with right greater than left siphon atherosclerosis resulting in mild right cavernous segment stenosis. ACAs and MCAs are patent without evidence of major branch occlusion or significant stenosis. A 2 mm aneurysm projects  laterally from the left MCA bifurcation. Posterior circulation: Vertebral arteries are widely patent to the basilar. PICA and SCA origins appear patent. Basilar artery is widely patent. There is a small patent posterior communicating artery on the left. PCAs are patent without evidence of significant stenosis. No aneurysm. Venous sinuses: Patent. Anatomic variants: None. Delayed phase: No abnormal enhancement. Review of the MIP images confirms the above findings IMPRESSION: 1. No large vessel occlusion. 2. Mild right cavernous ICA stenosis. 3. 2 mm left MCA bifurcation aneurysm. 4. Cervical carotid and vertebral artery atherosclerosis without significant stenosis. Electronically Signed   By: Logan Bores M.D.   On: 01/03/2016 20:04   Ct Angio Neck W Or Wo Contrast  Result Date: 01/03/2016 CLINICAL DATA:  Acute onset right gaze preference. History of atrial fibrillation. EXAM: CT ANGIOGRAPHY HEAD AND NECK TECHNIQUE: Multidetector CT imaging of the head and neck was performed using the standard protocol during bolus administration of intravenous contrast. Multiplanar CT image reconstructions and MIPs were obtained to evaluate the vascular anatomy. Carotid stenosis measurements (when applicable) are obtained utilizing NASCET criteria, using the distal internal carotid diameter as the denominator. CONTRAST:  50 mL Isovue 370 COMPARISON:  None. FINDINGS: CTA NECK FINDINGS Aortic arch: Standard branching. Extensive calcified and noncalcified plaque in the aortic arch. No significant arch vessel origins stenosis. Right carotid system: Medial course of the proximal common carotid artery. Moderate to heavy atherosclerotic plaque at the carotid bifurcation and in the proximal ICA, predominantly calcified. No significant stenosis or evidence of dissection. Left carotid system: Medial course of the proximal common carotid artery in the neck. Moderately extensive calcified plaque at the carotid bifurcation with mixed  calcified and noncalcified plaque in the proximal ICA. Proximal ICA narrowing is less than 50%. No evidence of dissection. Vertebral arteries: Patent vertebral arteries with the left minimally larger than the right. Atherosclerotic plaque involving the vertebral artery origins and proximal V1 segments bilaterally without significant stenosis. Skeleton: Moderately advanced lower cervical disc degeneration. Partial ankylosis across the disc spaces at C7-T1 and T1-2. Moderate cervical facet arthrosis. Other neck: Evidence of chronic bilateral maxillary sinusitis with mild mucosal thickening on the left and small volume fluid/ secretions bilaterally which may reflect superimposed acute sinusitis. No neck mass or lymph node enlargement. Upper chest: Advanced centrilobular emphysema. Review of the MIP images confirms the above findings CTA HEAD FINDINGS Anterior circulation: Internal carotid arteries are patent from skullbase to carotid termini with right greater than left siphon atherosclerosis resulting in mild right cavernous segment stenosis. ACAs and MCAs  are patent without evidence of major branch occlusion or significant stenosis. A 2 mm aneurysm projects laterally from the left MCA bifurcation. Posterior circulation: Vertebral arteries are widely patent to the basilar. PICA and SCA origins appear patent. Basilar artery is widely patent. There is a small patent posterior communicating artery on the left. PCAs are patent without evidence of significant stenosis. No aneurysm. Venous sinuses: Patent. Anatomic variants: None. Delayed phase: No abnormal enhancement. Review of the MIP images confirms the above findings IMPRESSION: 1. No large vessel occlusion. 2. Mild right cavernous ICA stenosis. 3. 2 mm left MCA bifurcation aneurysm. 4. Cervical carotid and vertebral artery atherosclerosis without significant stenosis. Electronically Signed   By: Logan Bores M.D.   On: 01/03/2016 20:04   Dg Chest Port 1  View  Result Date: 01/04/2016 CLINICAL DATA:  Recent stroke EXAM: PORTABLE CHEST 1 VIEW COMPARISON:  10/05/2015 FINDINGS: Cardiac shadow is stable. Aortic calcifications are again seen. Interstitial changes are again noted bilaterally. Mild blunting of left costophrenic angle is noted suggestive of small effusion. No acute bony abnormality is seen. No focal infiltrate is noted. IMPRESSION: Likely small pleural effusion on the left. Electronically Signed   By: Inez Catalina M.D.   On: 01/04/2016 07:56   Ct Head Code Stroke W/o Cm  Result Date: 01/03/2016 CLINICAL DATA:  Code stroke.  Visual loss and right-sided gaze. EXAM: CT HEAD WITHOUT CONTRAST TECHNIQUE: Contiguous axial images were obtained from the base of the skull through the vertex without intravenous contrast. COMPARISON:  None. FINDINGS: Brain: There is no evidence of acute cortical infarct, intracranial hemorrhage, mass, midline shift, or extra-axial fluid collection. There is asymmetric hypoattenuation involving the posterior and superior aspects of the right lentiform nucleus with asymmetric white matter low density in the right corona radiata/ external capsule. Subcortical and deep cerebral white matter hypodensities are nonspecific but compatible with moderate chronic small vessel ischemic disease. There is moderate cerebral atrophy. A punctate cortical calcification is noted in the right occipital lobe. Vascular: No hyperdense vessel. Calcified atherosclerosis at the skullbase. Skull: No fracture or focal osseous lesion. Sinuses/Orbits: Evidence of chronic bilateral maxillary sinusitis. Partially visualized left maxillary sinus mucosal thickening and small bilateral maxillary sinus fluid/ secretions which may indicate a superimposed acute component. Visualized mastoid air cells are clear. Prior bilateral cataract extraction. Other: None. ASPECTS [facility] Stroke Program Early CT Score) - Ganglionic level infarction (caudate, lentiform nuclei,  internal capsule, insula, M1-M3 cortex): 6 (1 point subtracted for right lentiform nucleus) - Supraganglionic infarction (M4-M6 cortex): 3 Total score (0-10 with 10 being normal): 9 IMPRESSION: 1. Possible age-indeterminate infarct in the right lentiform nucleus. No acute intracranial hemorrhage. 2. ASPECTS is 9. 3. Moderate chronic small vessel ischemic disease. These results were called by telephone at the time of interpretation on 01/03/2016 at 6:12 pm to Dr. Armida Sans, who verbally acknowledged these results. Electronically Signed   By: Logan Bores M.D.   On: 01/03/2016 18:17        Scheduled Meds: . enoxaparin (LOVENOX) injection  80 mg Subcutaneous Q12H  . multivitamin  1 tablet Oral Daily  . pantoprazole  40 mg Oral Daily  . potassium chloride  40 mEq Oral Once  . tamsulosin  0.8 mg Oral Daily  . tiotropium  18 mcg Inhalation Daily  . traZODone  200 mg Oral QHS   Continuous Infusions: . sodium chloride 75 mL/hr at 01/03/16 2119     LOS: 0 days    Time spent: 25 min  Home, DO [facility] Pager [phone]  If 7PM-7AM, please contact night-coverage [email or web address] Password [email or web address] 01/04/2016, 12:45 PM

## 2016-01-04 NOTE — Evaluation (Signed)
Speech Language Pathology Evaluation Patient Details Name: [name] MRN: [id number] DOB: Apr 18, 1928 Today's Date: 01/04/2016 Time: 1023-1050 SLP Time Calculation (min) (ACUTE ONLY): 27 min  Problem List:  Patient Active Problem List   Diagnosis Date Noted  . Acute embolic stroke ([place]) 73/42/8768  . Stroke (cerebrum) ([place]) 01/03/2016  . Bronchiectasis without acute exacerbation ([place]) 09/25/2015  . Abnormal CBC 07/16/2015  . Excess ear wax 07/16/2015  . Leg ulcer ([place]) 04/30/2015  . Advance care planning 05/23/2014  . Chronic hypoxemic respiratory failure ([place]) 09/05/2013  . Peripheral edema 07/16/2013  . Situational anxiety 07/16/2013  . Chronic anticoagulation- Xarelto since 2012 (PAF) 07/03/2013  . Recurrent spontaneous pneumothorax- s/p VATS 06/29/13 06/27/2013  . Skin lesion 09/13/2012  . Exertional shortness of breath 06/03/2012  . Fall at home 02/19/2012  . Constipation 10/12/2011  . Back pain 07/14/2011  . Urinary frequency 07/14/2011  . Medicare annual wellness visit, subsequent 04/25/2011  . Insomnia 04/25/2011  . PAD (peripheral artery disease) ([place]) 04/15/2011  . Neuropathy ([place]) 02/15/2011  . ED (erectile dysfunction) 09/28/2010  . PAF (paroxysmal atrial fibrillation)  09/09/2010  . Pneumothorax, left 09/06/2010  . NEOPLASMS UNSPEC NATURE BONE SOFT TISSUE&SKIN 02/10/2010  . MUSCLE CRAMPS 09/01/2009  . NICOTINE ADDICTION 02/06/2008  . UNSPEC DISORDER CARBOHYDRATE TRANSPORT&METAB 01/29/2007  . G E R D 07/28/2006  . HEMORRHOIDS 07/27/2006  . COPD 07/27/2006  . HYPERTHERMIA, MALIGNANT, FH 07/27/2006  . HX, PERSONAL, ALCOHOLISM 07/27/2006  . HERPES SIMPLEX INFECTION, TYPE I 01/17/2006  . GLUCOSE INTOLERANCE 11/18/2002  . ARTHRITIS, CERVICAL SPINE 12/17/2001  . OSTEOPENIA 12/17/2001  . Allergic rhinitis 11/17/2001  . DIVERTICULOSIS, COLON 10/18/1998  . DEPRESSION 04/17/1996  . 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 HLD (hyperlipidemia) 10/17/1985   Past  Medical History:  Past Medical History:  Diagnosis Date  . AF (atrial fibrillation) ([place])    after PTX 2012  . Allergy   . Arthritis    h/o R shoulder injection per ortho  . Back pain   . BPH (benign prostatic hyperplasia)   . COPD (chronic obstructive pulmonary disease) ([place])   . Coronary artery disease   . Elevated MCV   . Erectile dysfunction   . GERD (gastroesophageal reflux disease)   . HSV-1 (herpes simplex virus 1) infection   . Hyperlipidemia   . Spontaneous pneumothorax 09/04/2010   [name]   Past Surgical History:  Past Surgical History:  Procedure Laterality Date  . CATARACT EXTRACTION, BILATERAL    . CHEST TUBE PLACEMENT  09/04/2010  . VIDEO ASSISTED THORACOSCOPY  12/15/2010   Procedure: VIDEO ASSISTED THORACOSCOPY;  Surgeon: Randall Nakayama, MD;  Location: [place];  Service: Thoracic;  Laterality: Left;  blebectomy with pleural abrasion  . VIDEO ASSISTED THORACOSCOPY Right 06/28/2013   Procedure: VIDEO ASSISTED THORACOSCOPY with stapling of blebs;  Surgeon: Randall Nakayama, MD;  Location: [place];  Service: Thoracic;  Laterality: Right;  (R) VATS W/BLEBECTOMY   HPI:  Randall Fridman Hunsuckeris a 82 y.o.malewith history of atrial fibrillation, COPD, CAD, history of spontaneous pneumothorax presents to ER with complaints of patient having blurry vision. Patient's symptoms started around 3 PM after patient woke up from sleep and was watching TV. Patient initially felt some weakness in the lower extremitiesfollowed by the visual symptoms. On exam patient has been having right-sided gaze preference. Patient was able to move all extremities without difficulty. Neurologist on-call was consulted. 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    Assessment /  Plan / Recommendation Clinical Impression  Pt demonstrates visual deficits in the left visual field that he verbalizes awareness of and attempts to compensate for, He is alb  eto attend to left visual files. Cognition otherwise appears WNL. Speech and language also WNL. No SLP needs at this time. Will sign off.     SLP Assessment  Patient does not need any further Speech Lanaguage Pathology Services    Follow Up Recommendations  24 hour supervision/assistance    Frequency and Duration           SLP Evaluation Cognition  Overall Cognitive Status: Within Functional Limits for tasks assessed Orientation Level: Oriented X4       Comprehension  Auditory Comprehension Overall Auditory Comprehension: Appears within functional limits for tasks assessed Yes/No Questions: Within Functional Limits Commands: Within Functional Limits Conversation: Complex Reading Comprehension Reading Status: Unable to assess (comment) (visual impairment and no glasses)    Expression Expression Primary Mode of Expression: Verbal Verbal Expression Overall Verbal Expression: Appears within functional limits for tasks assessed   Oral / Motor  Oral Motor/Sensory Function Overall Oral Motor/Sensory Function: Within functional limits Motor Speech Overall Motor Speech: Appears within functional limits for tasks assessed   GO          Functional Assessment Tool Used: clinical judgement Functional Limitations: Swallowing Swallow Current Status (X0169): 0 percent impaired, limited or restricted Swallow Goal Status (S2967): 0 percent impaired, limited or restricted Swallow Discharge Status (O0047): 0 percent impaired, limited or restricted        Randall [facility], MA CCC-SLP [phone]  [name] 01/04/2016, 10:57 AM

## 2016-01-04 NOTE — Progress Notes (Signed)
STROKE TEAM PROGRESS NOTE   HISTORY OF PRESENT ILLNESS (per record) [name] is an 82 y.o. male with a past medical history significant for atrial fibrillation on xarelto (did not take it today), HLD, CAD, brought in by EMS as a code stroke due to acute onset right gaze preference. He was in his usual state of health ,watching a football game in the TV when suddenly noticed that he couldn't move his eyes to the left. Said that he got scared but denies associated visual loss, HA, vertigo, double vision, focal weakness or numbness, slurred speech, confusion, or language impairment. NIHSS 3. CT head without definitive acute abnormality but question of hypodensity around right lentiform nucleus, ASPECTS 9   Date last known well: 01/03/16 Time last known well: 3 PM tPA Given: no, xarelto less,than 24 h ago NIHSS: 3 MRS: 0   SUBJECTIVE (INTERVAL HISTORY) His son and wife are at the bedside.  Overall he feels his condition is gradually improving. Pt stated that he was watching TV and suddenly became blurry vision and whole body weakness. Wife stated that pt right gaze preference was new with limited left gaze.    OBJECTIVE Temp:  [97.5 F (36.4 C)-98.6 F (37 C)] 98.2 F (36.8 C) (12/18 1019) Pulse Rate:  [64-102] 70 (12/18 1019) Cardiac Rhythm: Heart block (12/17 2059) Resp:  [20-23] 20 (12/18 0230) BP: (139-165)/(66-94) 163/73 (12/18 1019) SpO2:  [94 %-100 %] 100 % (12/18 1019) FiO2 (%):  [6 %] 6 % (12/18 1019) Weight:  [75.4 kg (166 lb 4.8 oz)-77.3 kg (170 lb 6.7 oz)] 75.4 kg (166 lb 4.8 oz) (12/17 2030)  CBC:   Recent Labs Lab 01/03/16 1738 01/03/16 1746 01/04/16 0724  WBC 13.6*  --  11.0*  NEUTROABS 10.9*  --   --   HGB 13.8 12.6* 12.7*  HCT 39.2 37.0* 36.7*  MCV 102.6*  --  101.7*  PLT 271  --  545    Basic Metabolic Panel:   Recent Labs Lab 01/03/16 1738 01/03/16 1746 01/04/16 0724  NA 140 143 140  K 3.5 3.4* 3.4*  CL 105 102 108  CO2 26  --  27   GLUCOSE 115* 107* 103*  BUN _0 CREATININE 1.22 1.10 0.97  CALCIUM 9.0  --  8.6*    Lipid Panel:     Component Value Date/Time   CHOL 192 01/04/2016 0724   TRIG 90 01/04/2016 0724   HDL 36 (L) 01/04/2016 0724   CHOLHDL 5.3 01/04/2016 0724   VLDL 18 01/04/2016 0724   LDLCALC 138 (H) 01/04/2016 0724   HgbA1c:  Lab Results  Component Value Date   HGBA1C 5.4 11/26/2015   Urine Drug Screen:     Component Value Date/Time   LABOPIA NONE DETECTED 01/03/2016 2343   COCAINSCRNUR NONE DETECTED 01/03/2016 2343   LABBENZ NONE DETECTED 01/03/2016 2343   AMPHETMU NONE DETECTED 01/03/2016 2343   THCU NONE DETECTED 01/03/2016 2343   LABBARB NONE DETECTED 01/03/2016 2343      IMAGING I have personally reviewed the radiological images below and agree with the radiology interpretations.  Ct Angio Head and Neck W Or Wo Contrast 01/03/2016 1. No large vessel occlusion.  2. Mild right cavernous ICA stenosis.  3. 2 mm left MCA bifurcation aneurysm.  4. Cervical carotid and vertebral artery atherosclerosis without significant stenosis.   Ct Head Wo Contrast 01/04/2016 Atrophy with periventricular small vessel disease is stable. Slight decreased attenuation in the right lentiform nucleus compared  to the left is stable compared to 1 day prior. No new gray-white compartment lesions are evident. No mass, hemorrhage, or extra-axial fluid collection. Areas of vascular calcification are stable.   Dg Chest Port 1 View 01/04/2016 Likely small pleural effusion on the left.   Ct Head Code Stroke W/o Cm 01/03/2016 1. Possible age-indeterminate infarct in the right lentiform nucleus. No acute intracranial hemorrhage.  2. ASPECTS is 9.  3. Moderate chronic small vessel ischemic disease.   EEG  01/04/2016 Impression: This awake but mostly drowsy and asleep EEG is normal.   Clinical Correlation: A normal EEG does not exclude a clinical diagnosis of epilepsy.  If further clinical  questions remain, prolonged EEG may be helpful.  Clinical correlation is advised.   Transthoracic echocardiogram 12/17/2015 Study Conclusions - Left ventricle: The cavity size was normal. Wall thickness was   increased in a pattern of mild LVH. Systolic function was normal.   The estimated ejection fraction was in the range of 60% to 65%.   Doppler parameters are consistent with abnormal left ventricular   relaxation (grade 1 diastolic dysfunction). - Mitral valve: There was mild regurgitation. - Left atrium: The atrium was mildly dilated. - Pulmonary arteries: PA peak pressure: 42 mm Hg (S).    PHYSICAL EXAM  Temp:  [97.7 F (36.5 C)-98.6 F (37 C)] 98 F (36.7 C) (12/18 2136) Pulse Rate:  [64-87] 87 (12/18 2136) Resp:  [20] 20 (12/18 2136) BP: (148-165)/(62-94) 152/90 (12/18 2136) SpO2:  [98 %-100 %] 99 % (12/18 2136) FiO2 (%):  [6 %] 6 % (12/18 1019)  General - Well nourished, well developed, in mild respiratory distress.  Ophthalmologic - Fundi not visualized due to noncooperation.  Cardiovascular - irregularly irregular heart rate and rhythm.  Mental Status -  Level of arousal and orientation to time, place, and person were intact. Language including expression, naming, repetition, comprehension was assessed and found intact. Fund of Knowledge was assessed and was intact.  Cranial Nerves II - XII - II - Visual field intact OU. III, IV, VI - Extraocular movements left gaze paralysis, also decreased right eye adduction with convergence. V - Facial sensation intact bilaterally. VII - Facial movement intact bilaterally. VIII - Hearing & vestibular intact bilaterally. X - Palate elevates symmetrically. XI - Chin turning & shoulder shrug intact bilaterally. XII - Tongue protrusion intact.  Motor Strength - The patient's strength was normal in all extremities and pronator drift was absent.  Bulk was normal and fasciculations were absent.   Motor Tone - Muscle tone was  assessed at the neck and appendages and was normal.  Reflexes - The patient's reflexes were 1+ in all extremities and he had no pathological reflexes.  Sensory - Light touch, temperature/pinprick were assessed and were symmetrical.    Coordination - The patient had normal movements in the hands and feet with no ataxia or dysmetria.  Tremor was absent.  Gait and Station - not tested.   ASSESSMENT/PLAN Mr. [name] is a 82 y.o. male with history of spontaneous pneumothorax, hyperlipidemia, CAD, COPD, and atrial fibrillation on Xarelto presenting with acute onset right gaze preference. He did not receive IV t-PA due to anticoagulation.  Possible pontine stroke - likely secondary to afib with subtherapeutic dose and not compliant with Xarelto  Resultant  Left gaze paralysis  MRI - patient unable to lie flat  CT Head -  Possible remote infarct in the right lentiform nucleus.  CTA head and neck - no large vessel  occlusion  EEG - normal  2D Echo - EF 60-65%. No cardiac source of emboli identified.  LDL - 138  HgbA1c pending  VTE prophylaxis Xarelto Diet regular Room service appropriate? Yes; Fluid consistency: Thin  Xarelto (rivaroxaban) daily prior to admission, now on Xarelto (rivaroxaban) daily. Recommend to increase Xarelto to 53m daily due to pt CrCl > 50.   Patient counseled to be compliant with his antithrombotic medications  Ongoing aggressive stroke risk factor management  Therapy recommendations: pending  Disposition: Pending  Afib, chronic  On Xarelto 133mdaily for 4-5 years  Does not want to change to other DOACs  CrCl 59, which is > 50  Normal dose should be 2047maily  Hypertension  Stable  Permissive hypertension (OK if < 220/120) but gradually normalize in 5-7 days  Long-term BP goal normotensive  Hyperlipidemia  Home meds: No lipid lowering medications prior to admission.   LDL 138, goal < 70  Add pravastatin 40 mg daily    Intolerance to crestor  Continue statin at discharge  Other Stroke Risk Factors  Advanced age  The patient quit smoking approximately 15 years ago  Family hx stroke (brother)  CAD  Other Active Problems  Hx of pneumothorax  COPD  Home O2 use  Hospital day # 0  Neurology will sign off. Please call with questions. [facility]. Thanks for the consult.  JinRosalin HawkingD PhD Stroke Neurology 01/04/2016 10:39 PM   To contact Stroke Continuity provider, please refer to [email or web address], contact General Neurology

## 2016-01-04 NOTE — Evaluation (Signed)
Clinical/Bedside Swallow Evaluation Patient Details  Name: [name] MRN: [id number] Date of Birth: 1928-04-20  Today's Date: 01/04/2016 Time: SLP Start Time (ACUTE ONLY): 1021 SLP Stop Time (ACUTE ONLY): 1050 SLP Time Calculation (min) (ACUTE ONLY): 29 min  Past Medical History:  Past Medical History:  Diagnosis Date  . AF (atrial fibrillation) ([place])    after PTX 2012  . Allergy   . Arthritis    h/o R shoulder injection per ortho  . Back pain   . BPH (benign prostatic hyperplasia)   . COPD (chronic obstructive pulmonary disease) ([place])   . Coronary artery disease   . Elevated MCV   . Erectile dysfunction   . GERD (gastroesophageal reflux disease)   . HSV-1 (herpes simplex virus 1) infection   . Hyperlipidemia   . Spontaneous pneumothorax 09/04/2010   [name]   Past Surgical History:  Past Surgical History:  Procedure Laterality Date  . CATARACT EXTRACTION, BILATERAL    . CHEST TUBE PLACEMENT  09/04/2010  . VIDEO ASSISTED THORACOSCOPY  12/15/2010   Procedure: VIDEO ASSISTED THORACOSCOPY;  Surgeon: Melrose Nakayama, MD;  Location: [place];  Service: Thoracic;  Laterality: Left;  blebectomy with pleural abrasion  . VIDEO ASSISTED THORACOSCOPY Right 06/28/2013   Procedure: VIDEO ASSISTED THORACOSCOPY with stapling of blebs;  Surgeon: Melrose Nakayama, MD;  Location: [place];  Service: Thoracic;  Laterality: Right;  (R) VATS W/BLEBECTOMY   HPI:  Randall Mcnamee Hunsuckeris a 82 y.o.malewith history of atrial fibrillation, COPD, CAD, history of spontaneous pneumothorax presents to ER with complaints of patient having blurry vision. Patient's symptoms started around 3 PM after patient woke up from sleep and was watching TV. Patient initially felt some weakness in the lower extremitiesfollowed by the visual symptoms. On exam patient has been having right-sided gaze preference. Patient was able to move all extremities without difficulty. Neurologist on-call was consulted.  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    Assessment / Plan / Recommendation Clinical Impression  Pt demonstrates normal swallow function with no signs of aspiration or dysphagia. Pt may start a regular diet with thin liquids. No SLP f/u needed will sign off.     Aspiration Risk  Mild aspiration risk    Diet Recommendation Regular;Thin liquid   Liquid Administration via: Cup;Straw Medication Administration: Whole meds with liquid Supervision: Patient able to self feed Postural Changes: Seated upright at 90 degrees    Other  Recommendations Oral Care Recommendations: Oral care BID   Follow up Recommendations None      Frequency and Duration            Prognosis        Swallow Study   General HPI: Randall Hearne Hunsuckeris a 82 y.o.malewith history of atrial fibrillation, COPD, CAD, history of spontaneous pneumothorax presents to ER with complaints of patient having blurry vision. Patient's symptoms started around 3 PM after patient woke up from sleep and was watching TV. Patient initially felt some weakness in the lower extremitiesfollowed by the visual symptoms. On exam patient has been having right-sided gaze preference. Patient was able to move all extremities without difficulty. Neurologist on-call was consulted. 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  Type of Study: Bedside Swallow Evaluation Previous Swallow Assessment: none Diet Prior to this Study: NPO Temperature Spikes Noted: No Respiratory Status: Nasal cannula History of Recent Intubation: No Behavior/Cognition: Alert;Cooperative;Pleasant mood Oral Care Completed by SLP:  No Oral Cavity - Dentition: Adequate natural dentition Self-Feeding Abilities: Able to feed self Patient Positioning: Upright in bed Baseline Vocal Quality: Normal Volitional Cough: Strong Volitional Swallow: Able to elicit    Oral/Motor/Sensory  Function Overall Oral Motor/Sensory Function: Within functional limits   Ice Chips Ice chips: Not tested   Thin Liquid Thin Liquid: Within functional limits Presentation: Cup;Straw;Self Fed    Nectar Thick Nectar Thick Liquid: Not tested   Honey Thick Honey Thick Liquid: Not tested   Puree Puree: Within functional limits   Solid   GO   Solid: Within functional limits    Functional Assessment Tool Used: clinical judgement Functional Limitations: Swallowing Swallow Current Status (W1191): 0 percent impaired, limited or restricted Swallow Goal Status (Y7829): 0 percent impaired, limited or restricted Swallow Discharge Status [phone]): 0 percent impaired, limited or restricted  Randall [place], MA CCC-SLP [phone]  [name], [name] 01/04/2016,10:53 AM

## 2016-01-04 NOTE — Care Management Obs Status (Signed)
[facility] NOTIFICATION   Patient Details  Name: [name] MRN: [id number] Date of Birth: [birth year]   Medicare Observation Status Notification Given:  Yes    Pollie Friar, RN 01/04/2016, 3:19 PM

## 2016-01-04 NOTE — Evaluation (Signed)
Physical Therapy Evaluation Patient Details Name: [name] MRN: [id number] DOB: 10-25-1928 Today's Date: 01/04/2016   History of Present Illness  Ptis an 82 y.o.malewith history of atrial fibrillation, COPD, CAD, spontaneous pneumothorax who presents to ER with complaints of blurry vision. Patient's symptoms started around 3 PM after patient woke up from sleep and was watching TV. Patient initially felt some weakness in the lower extremitiesfollowed by the visual symptoms. 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  Clinical Impression  Pt admitted with above diagnosis. Pt currently with functional limitations due to the deficits listed below (see PT Problem List). At the time of PT eval pt was able to perform transfers and minimal ambulation with min assist for balance support and general safety. PT assisted pt in transferring off BSC, and walking to the chair. As pt was sitting on the [facility] for a while to attempt BM, he was fatigued, and mobility was limited during session. Discussed the option of HHPT at d/c for continued therapy prior to return home. However, due to limited mobility at evaluation, next session may determine the need for a higher level of care at d/c. Pt will benefit from skilled PT to increase their independence and safety with mobility to allow discharge to the venue listed below.       Follow Up Recommendations Home health PT;Supervision/Assistance - 24 hour    Equipment Recommendations  Rolling walker with 5" wheels    Recommendations for Other Services       Precautions / Restrictions Precautions Precautions: Fall Restrictions Weight Bearing Restrictions: No      Mobility  Bed Mobility               General bed mobility comments: Pt received sitting up on BSC.  Transfers Overall transfer level: Needs assistance Equipment used: Rolling walker (2 wheeled) Transfers: Sit to/from Stand Sit to Stand:  Min guard         General transfer comment: VC's for hand placement on seated surface for safety. Increased time for pt to power-up to full standing position. Pt was able to perform peri-care with single UE support on walker.   Ambulation/Gait Ambulation/Gait assistance: Min assist Ambulation Distance (Feet): 5 Feet Assistive device: Rolling walker (2 wheeled) Gait Pattern/deviations: Step-through pattern;Decreased stride length;Trunk flexed Gait velocity: Decreased Gait velocity interpretation: Below normal speed for age/gender General Gait Details: Assist for balance support and walker management as pt took a few steps from [facility] to chair.   Stairs            Wheelchair Mobility    Modified Rankin (Stroke Patients Only) Modified Rankin (Stroke Patients Only) Pre-Morbid Rankin Score: Slight disability Modified Rankin: Moderately severe disability     Balance Overall balance assessment: Needs assistance Sitting-balance support: Feet supported;No upper extremity supported Sitting balance-Leahy Scale: Fair     Standing balance support: Single extremity supported;During functional activity Standing balance-Leahy Scale: Poor Standing balance comment: Requires hands on assist for safety.                              Pertinent Vitals/Pain Pain Assessment: No/denies pain    Home Living Family/patient expects to be discharged to:: Private residence Living Arrangements: Spouse/significant other Available Help at Discharge: Family;Available 24 hours/day Type of Home: House Home Access: Stairs to enter Entrance Stairs-Rails: Right Entrance Stairs-Number of Steps: 3-4 Home Layout: One level Home Equipment: Cane - single  point;Walker - 4 wheels      Prior Function Level of Independence: Independent with assistive device(s)         Comments: Pt used the cane more than the walker.      Hand Dominance   Dominant Hand: Right    Extremity/Trunk  Assessment   Upper Extremity Assessment Upper Extremity Assessment: Defer to OT evaluation    Lower Extremity Assessment Lower Extremity Assessment: Generalized weakness (Grossly 4- to 4/5. )    Cervical / Trunk Assessment Cervical / Trunk Assessment: Kyphotic  Communication   Communication: HOH  Cognition Arousal/Alertness: Awake/alert Behavior During Therapy: WFL for tasks assessed/performed Overall Cognitive Status: Within Functional Limits for tasks assessed                      General Comments General comments (skin integrity, edema, etc.): Noted R gaze preference. Pt was able to track almost to midline but had difficulty looking midline and beyond to the L. Peripheral vision decreased on the L side vs. the R.     Exercises     Assessment/Plan    PT Assessment Patient needs continued PT services  PT Problem List Decreased strength;Decreased range of motion;Decreased activity tolerance;Decreased balance;Decreased mobility;Decreased knowledge of use of DME;Decreased safety awareness;Decreased knowledge of precautions;Decreased cognition          PT Treatment Interventions DME instruction;Gait training;Stair training;Functional mobility training;Therapeutic activities;Therapeutic exercise;Neuromuscular re-education;Patient/family education    PT Goals (Current goals can be found in the Care Plan section)  Acute Rehab PT Goals Patient Stated Goal: Get better PT Goal Formulation: With patient/family Time For Goal Achievement: 01/11/16 Potential to Achieve Goals: Good    Frequency Min 3X/week   Barriers to discharge        Co-evaluation               End of Session Equipment Utilized During Treatment: Gait belt Activity Tolerance: Patient limited by fatigue Patient left: in chair;with chair alarm set;with call bell/phone within reach;with family/visitor present Nurse Communication: Mobility status    Functional Assessment Tool Used: Clinical  judgement Functional Limitation: Mobility: Walking and moving around Mobility: Walking and Moving Around Current Status [phone]): At least 20 percent but less than 40 percent impaired, limited or restricted Mobility: Walking and Moving Around Goal Status [phone]): At least 1 percent but less than 20 percent impaired, limited or restricted    Time: 1052-1117 PT Time Calculation (min) (ACUTE ONLY): 25 min   Charges:   PT Evaluation $PT Eval Moderate Complexity: 1 Procedure PT Treatments $Gait Training: 8-22 mins   PT G Codes:   PT G-Codes **NOT FOR INPATIENT CLASS** Functional Assessment Tool Used: Clinical judgement Functional Limitation: Mobility: Walking and moving around Mobility: Walking and Moving Around Current Status ([id number]): At least 20 percent but less than 40 percent impaired, limited or restricted Mobility: Walking and Moving Around Goal Status [phone]): At least 1 percent but less than 20 percent impaired, limited or restricted    Thelma Comp 01/04/2016, 2:11 PM   Rolinda Roan, PT, DPT Acute Rehabilitation Services Pager: [phone]

## 2016-01-05 ENCOUNTER — Ambulatory Visit: Payer: Medicare Other | Admitting: Pulmonary Disease

## 2016-01-05 DIAGNOSIS — E784 Other hyperlipidemia: Secondary | ICD-10-CM | POA: Diagnosis not present

## 2016-01-05 LAB — HEMOGLOBIN A1C
HEMOGLOBIN A1C: 5.4 % (ref 4.8–5.6)
MEAN PLASMA GLUCOSE: 108 mg/dL

## 2016-01-05 MED ORDER — POTASSIUM CHLORIDE CRYS ER 20 MEQ PO TBCR
40.0000 meq | EXTENDED_RELEASE_TABLET | Freq: Once | ORAL | Status: AC
  Administered 2016-01-05: 40 meq via ORAL
  Filled 2016-01-05: qty 2

## 2016-01-05 MED ORDER — RIVAROXABAN 20 MG PO TABS: 20.0000 mg | ORAL_TABLET | Freq: Every day | ORAL | 0 refills | Status: DC

## 2016-01-05 MED ORDER — PRAVASTATIN SODIUM 40 MG PO TABS: 40.0000 mg | ORAL_TABLET | Freq: Every day | ORAL | 0 refills | Status: DC

## 2016-01-05 NOTE — Progress Notes (Signed)
Pt and family did not want to wait on delivery of the walker. Pts son asked that walker be mailed to the house. CM notified Larene Beach with [facility].

## 2016-01-05 NOTE — Care Management Note (Signed)
Case Management Note  Patient Details  Name: [name] MRN: [id number] Date of Birth: 02-11-1928  Subjective/Objective:                    Action/Plan: Pt discharging home with orders for [facility] services. CM met with the patient and his family and provided a list of [facility] agencies in [place]. They selected [facility]. CM spoke with [facility] and faxed them the information they requested. Await acceptance of referral. Pt also with orders for walker. CM notified Larene Beach with [facility] and she will deliver the equipment to the room.   Expected Discharge Date:                  Expected Discharge Plan:  [place]  In-House Referral:     Discharge planning Services  CM Consult  Post Acute Care Choice:  Durable Medical Equipment, Home Health Choice offered to:  Patient, Spouse  DME Arranged:  Walker rolling DME Agency:  [facility] Arranged:  PT, OT [facility] Agency:  [facility]  Status of Service:  Completed, signed off  If discussed at [place] of Stay Meetings, dates discussed:    Additional Comments:  Pollie Friar, RN 01/05/2016, 1:39 PM

## 2016-01-05 NOTE — Progress Notes (Signed)
Patient given discharge instructions with medication educations, with family at bedside.  All questions and concerns addressed.  No complaints of pain or discomfort. Left unit in wheelchair to private vehicle.

## 2016-01-05 NOTE — Discharge Instructions (Signed)
Ischemic Stroke An ischemic stroke is the sudden death of brain tissue. Blood carries oxygen to all areas of the body. This type of stroke happens when your blood does not flow to your brain like normal. Your brain cannot get the oxygen it needs. This is an emergency. It must be treated right away. Symptoms of a stroke usually happen all of a sudden. You may notice them when you wake up. They can include:  Weakness or loss of feeling in your face, arm, or leg. This often happens on one side of the body.  Trouble walking.  Trouble moving your arms or legs.  Loss of balance or coordination.  Feeling confused.  Trouble talking or understanding what people are saying.  Slurred speech.  Trouble seeing.  Seeing two of one object (double vision).  Feeling dizzy.  Feeling sick to your stomach (nauseous) and throwing up (vomiting).  A very bad headache for no reason. Get help as soon as any of these problems start. This is important. Some treatments work better if they are given right away. These include:  Aspirin.  Medicines to control blood pressure.  A shot (injection) of medicine to break up the blood clot.  Treatments given in the blood vessel (artery) to take out the clot or break it up. Other treatments may include:  Oxygen.  Fluids given through an IV tube.  Medicines to thin out your blood.  Procedures to help your blood flow better. What increases the risk? Certain things may make you more likely to have a stroke. Some of these are things that you can change, such as:  Being very overweight (obesity).  Smoking.  Taking birth control pills.  Not being active.  Drinking too much alcohol.  Using drugs. Other risk factors include:  High blood pressure.  High cholesterol.  Diabetes.  Heart disease.  Being [place] American, Native American, Hispanic, or [place] Native.  Being over age 1.  Family history of stroke.  Having had blood clots, stroke,  or warning stroke (transient ischemic attack, TIA) in the past.  Sickle cell disease.  Being a woman with a history of high blood pressure in pregnancy (preeclampsia).  Migraine headache.  Sleep apnea.  Having an irregular heartbeat (atrial fibrillation).  Long-term (chronic) diseases that cause soreness and swelling (inflammation).  Disorders that affect how your blood clots. Follow these instructions at home: Medicines  Take over-the-counter and prescription medicines only as told by your doctor.  If you were told to take aspirin or another medicine to thin your blood, take it exactly as told by your doctor.  Taking too much of the medicine can cause bleeding.  If you do not take enough, it may not work as well.  Know the side effects of your medicines. If you are taking a blood thinner, make sure you:  Hold pressure over any cuts for longer than usual.  Tell your dentist and other doctors that you take this medicine.  Avoid activities that may cause damage or injury to your body. Eating and drinking  Follow instructions from your doctor about what you cannot eat or drink.  Eat healthy foods.  If you have trouble with swallowing, do these things to avoid choking:  Take small bites when eating.  Eat foods that are soft or pureed. Safety  Follow instructions from your health care team about physical activity.  Use a walker or cane as told by your doctor.  Keep your home safe so you do not fall. This  may include:  Having experts look at your home to make sure it is safe.  Putting grab bars in the bedroom and bathroom.  Using raised toilets.  Putting a seat in the shower. General instructions  Do not use any tobacco products.  Examples of these are cigarettes, chewing tobacco, and e-cigarettes.  If you need help quitting, ask your doctor.  Limit how much alcohol you drink. This means no more than 1 drink a day for nonpregnant women and 2 drinks a day  for men. One drink equals 12 oz of beer, 5 oz of wine, or 1 oz of hard liquor.  If you need help to stop using drugs or alcohol, ask your doctor to refer you to a program or specialist.  Stay active. Exercise as told by your doctor.  Keep all follow-up visits as told by your doctor. This is important. Get help right away if:  You suddenly:  Have weakness or loss of feeling in your face, arm, or leg.  Feel confused.  Have trouble talking or understanding what people are saying.  Have trouble seeing.  Have trouble walking.  Have trouble moving your arms or legs.  Feel dizzy.  Lose your balance or coordination.  Have a very bad headache and you do not know why.  You pass out (lose consciousness) or almost pass out.  You have jerky movements that you cannot control (seizure). These symptoms may be an emergency. Do not wait to see if the symptoms will go away. Get medical help right away. Call your local emergency services (911 in the [place] Do not drive yourself to the hospital.  This information is not intended to replace advice given to you by your health care provider. Make sure you discuss any questions you have with your health care provider. Document Released: 12/23/2010 Document Revised: 06/16/2015 Document Reviewed: 04/01/2015 Elsevier Interactive Patient Education  2017 [place]. Follow with [name], MD in 5-7 days  Please get a complete blood count and chemistry panel checked by your Primary MD at your next visit, and again as instructed by your Primary MD. Please get your medications reviewed and adjusted by your Primary MD.  Please request your Primary MD to go over all Hospital Tests and Procedure/Radiological results at the follow up, please get all Hospital records sent to your Prim MD by signing hospital release before you go home.  If you had Pneumonia of Lung problems at the Hospital: Please get a 2 view Chest X ray done in 6-8 weeks after hospital  discharge or sooner if instructed by your Primary MD.  If you have Congestive Heart Failure: Please call your Cardiologist or Primary MD anytime you have any of the following symptoms:  1) 3 pound weight gain in 24 hours or 5 pounds in 1 week  2) shortness of breath, with or without a dry hacking cough  3) swelling in the hands, feet or stomach  4) if you have to sleep on extra pillows at night in order to breathe  Follow cardiac low salt diet and 1.5 lit/day fluid restriction.  If you have diabetes Accuchecks 4 times/day, Once in AM empty stomach and then before each meal. Log in all results and show them to your primary doctor at your next visit. If any glucose reading is under 80 or above 300 call your primary MD immediately.  If you have Seizure/Convulsions/Epilepsy: Please do not drive, operate heavy machinery, participate in activities at heights or participate in high speed  sports until you have seen by Primary MD or a Neurologist and advised to do so again.  If you had Gastrointestinal Bleeding: Please ask your Primary MD to check a complete blood count within one week of discharge or at your next visit. Your endoscopic/colonoscopic biopsies that are pending at the time of discharge, will also need to followed by your Primary MD.  Get Medicines reviewed and adjusted. Please take all your medications with you for your next visit with your Primary MD  Please request your Primary MD to go over all hospital tests and procedure/radiological results at the follow up, please ask your Primary MD to get all Hospital records sent to his/her office.  If you experience worsening of your admission symptoms, develop shortness of breath, life threatening emergency, suicidal or homicidal thoughts you must seek medical attention immediately by calling 911 or calling your MD immediately  if symptoms less severe.  You must read complete instructions/literature along with all the possible adverse  reactions/side effects for all the Medicines you take and that have been prescribed to you. Take any new Medicines after you have completely understood and accpet all the possible adverse reactions/side effects.   Do not drive or operate heavy machinery when taking Pain medications.   Do not take more than prescribed Pain, Sleep and Anxiety Medications  Special Instructions: If you have smoked or chewed Tobacco  in the last 2 yrs please stop smoking, stop any regular Alcohol  and or any Recreational drug use.  Wear Seat belts while driving.  Please note You were cared for by a hospitalist during your hospital stay. If you have any questions about your discharge medications or the care you received while you were in the hospital after you are discharged, you can call the unit and asked to speak with the hospitalist on call if the hospitalist that took care of you is not available. Once you are discharged, your primary care physician will handle any further medical issues. Please note that NO REFILLS for any discharge medications will be authorized once you are discharged, as it is imperative that you return to your primary care physician (or establish a relationship with a primary care physician if you do not have one) for your aftercare needs so that they can reassess your need for medications and monitor your lab values.  You can reach the hospitalist office at phone [phone] or fax [phone]   If you do not have a primary care physician, you can call [phone] for a physician referral.  Activity: As tolerated with Full fall precautions use walker/cane & assistance as needed

## 2016-01-05 NOTE — Discharge Summary (Signed)
Physician Discharge Summary  [name] TKP:[id number] DOB: 10-28-1928 DOA: 01/03/2016  PCP: [name], MD  Admit date: 01/03/2016 Discharge date: 01/05/2016   Recommendations for Outpatient Follow-Up:   1. BMP 1 week 2. Home health with 24 hour supervision 3. Repeat x ray   Discharge Diagnosis:   Principal Problem:   Acute embolic stroke ([place]) Active Problems:   HLD (hyperlipidemia)   PAF (paroxysmal atrial fibrillation)    PAD (peripheral artery disease) ([place])   Stroke (cerebrum) [facility])   Discharge disposition:  Home.   Discharge Condition: Improved.  Diet recommendation: Low sodium, heart healthy.  Carbohydrate-modified.  Wound care: None.   History of Present Illness:   [name] is a 82 y.o. male with history of atrial fibrillation, COPD, CAD, history of spontaneous pneumothorax presents to ER with complaints of patient having blurry vision. Patient's symptoms started around 3 PM after patient woke up from sleep and was watching TV. Patient initially felt some weakness in the lower extremities followed by the visual symptoms. On exam patient has been having right-sided gaze preference. Patient was able to move all extremities without difficulty. Neurologist on-call was consulted. 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 This was followed by CT angiogram of the head and neck since patient was unable to have MRI. Patient is being admitted for further stroke workup. Patient takes Xarelto for A. fib and has not taken his dose today.   Hospital Course by Problem:   Acute embolic stroke- appreciate neurology consult.  -2-D echo:  Left ventricle: The cavity size was normal. Wall thickness was   increased in a pattern of mild LVH. Systolic function was normal.   The estimated ejection fraction was in the range of 60% to 65%.   Doppler parameters are consistent with abnormal left ventricular   relaxation  (grade 1 diastolic dysfunction). - Mitral valve: There was mild regurgitation. - Left atrium: The atrium was mildly dilated -hemoglobin A1c: 5.4 -lipid panel: LDL 138-- allergy to statin but will try -physical therapy consult- home health -EEG- normal  Paroxysmal atrial fibrillation- continue metoprolol.  -resume xarelto but at a higher dose (20 mg vs 15 mg)  COPD- not actively wheezing. Continue home inhalers. -on O2 at home  History of CAD- denies any chest pain.  History of recurrent pneumothorax - denies any shortness of breath- x ray shows small left pleural effusion              Hypokalemia             -replace     Medical Consultants:    Neuro   Discharge Exam:   Vitals:   01/05/16 0443 01/05/16 0933  BP: 134/68 (!) 147/72  Pulse: 82 71  Resp: 18 18  Temp: 97.8 F (36.6 C) 98 F (36.7 C)   Vitals:   01/04/16 2136 01/05/16 0108 01/05/16 0443 01/05/16 0933  BP: (!) 152/90 128/74 134/68 (!) 147/72  Pulse: 87 80 82 71  Resp: _0 Temp: 98 F (36.7 C) 97.8 F (36.6 C) 97.8 F (36.6 C) 98 F (36.7 C)  TempSrc: Oral Oral Oral Oral  SpO2: 99% 97% 97% 96%  Weight:      Height:        Gen:  NAD    The results of significant diagnostics from this hospitalization (including imaging, microbiology, ancillary and laboratory) are listed below for reference.     Procedures and  Diagnostic Studies:   Ct Head Wo Contrast  Result Date: 01/04/2016 CLINICAL DATA:  Right-sided weakness. EXAM: CT HEAD WITHOUT CONTRAST TECHNIQUE: Contiguous axial images were obtained from the base of the skull through the vertex without intravenous contrast. COMPARISON:  January 03, 2016 FINDINGS: Brain: Mild diffuse atrophy is stable. There is no intracranial mass, hemorrhage, extra-axial fluid collection, or midline shift. There is patchy small vessel disease throughout the centra semiovale bilaterally, stable. Decreased attenuation right lentiform nucleus  compared to the left side has not progressed since 1 day prior. There is no new gray-white compartment lesion compared to 1 day prior. Vascular: There is no hyperdense vessel evident. There is calcification in each carotid siphon region as well as in each distal vertebral artery. Skull: The bony calvarium appears intact. Sinuses/Orbits: Visualized paranasal sinuses clear. Visualized orbits appear symmetric bilaterally. Other: Mastoid air cells are clear. IMPRESSION: Atrophy with periventricular small vessel disease is stable. Slight decreased attenuation in the right lentiform nucleus compared to the left is stable compared to 1 day prior. No new gray-white compartment lesions are evident. No mass, hemorrhage, or extra-axial fluid collection. Areas of vascular calcification are stable. Electronically Signed   By: Lowella Grip III M.D.   On: 01/04/2016 13:34   Dg Chest Port 1 View  Result Date: 01/04/2016 CLINICAL DATA:  Recent stroke EXAM: PORTABLE CHEST 1 VIEW COMPARISON:  10/05/2015 FINDINGS: Cardiac shadow is stable. Aortic calcifications are again seen. Interstitial changes are again noted bilaterally. Mild blunting of left costophrenic angle is noted suggestive of small effusion. No acute bony abnormality is seen. No focal infiltrate is noted. IMPRESSION: Likely small pleural effusion on the left. Electronically Signed   By: Inez Catalina M.D.   On: 01/04/2016 07:56     Labs:   Basic Metabolic Panel:  Recent Labs Lab 01/03/16 1738 01/03/16 1746 01/04/16 0724  NA 140 143 140  K 3.5 3.4* 3.4*  CL 105 102 108  CO2 26  --  27  GLUCOSE 115* 107* 103*  BUN _0 CREATININE 1.22 1.10 0.97  CALCIUM 9.0  --  8.6*   GFR Estimated Creatinine Clearance: 55.7 mL/min (by C-G formula based on SCr of 0.97 mg/dL). Liver Function Tests:  Recent Labs Lab 01/03/16 1738 01/04/16 0724  AST 16 14*  ALT 9* 10*  ALKPHOS 63 56  BILITOT 0.7 0.9  PROT 7.3 6.2*  ALBUMIN 3.9 3.4*   No  results for input(s): LIPASE, AMYLASE in the last 168 hours. No results for input(s): AMMONIA in the last 168 hours. Coagulation profile  Recent Labs Lab 01/03/16 1738  INR 1.18    CBC:  Recent Labs Lab 01/03/16 1738 01/03/16 1746 01/04/16 0724  WBC 13.6*  --  11.0*  NEUTROABS 10.9*  --   --   HGB 13.8 12.6* 12.7*  HCT 39.2 37.0* 36.7*  MCV 102.6*  --  101.7*  PLT 271  --  267   Cardiac Enzymes: No results for input(s): CKTOTAL, CKMB, CKMBINDEX, TROPONINI in the last 168 hours. BNP: Invalid input(s): POCBNP CBG: No results for input(s): GLUCAP in the last 168 hours. D-Dimer No results for input(s): DDIMER in the last 72 hours. Hgb A1c  Recent Labs  01/04/16 0724  HGBA1C 5.4   Lipid Profile  Recent Labs  01/04/16 0724  CHOL 192  HDL 36*  LDLCALC 138*  TRIG 90  CHOLHDL 5.3   Thyroid function studies No results for input(s): TSH, T4TOTAL, T3FREE, THYROIDAB in the last 72  hours.  Invalid input(s): FREET3 Anemia work up No results for input(s): VITAMINB12, FOLATE, FERRITIN, TIBC, IRON, RETICCTPCT in the last 72 hours. Microbiology No results found for this or any previous visit (from the past 240 hour(s)).   Discharge Instructions:   Discharge Instructions    Ambulatory referral to Neurology    Complete by:  As directed    Follow up with NP Cecille Rubin at [facility] in about 2 months. Thanks.   Diet - low sodium heart healthy    Complete by:  As directed    Discharge instructions    Complete by:  As directed    Home health   Increase activity slowly    Complete by:  As directed      Allergies as of 01/05/2016      Reactions   Rosuvastatin    REACTION: myalgias at 29m/day dose      Medication List    TAKE these medications   acetaminophen 325 MG tablet Commonly known as:  TYLENOL Take 650 mg by mouth every 6 (six) hours as needed for mild pain.   albuterol 108 (90 Base) MCG/ACT inhaler Commonly known as:  PROAIR HFA Inhale 2 puffs into  the lungs every 4 (four) hours as needed for wheezing or shortness of breath.   FIBER PO Take 1 capsule by mouth 2 (two) times daily.   fluticasone 50 MCG/ACT nasal spray Commonly known as:  FLONASE Place 2 sprays into both nostrils daily. What changed:  when to take this  reasons to take this   FLUTTER Devi Use as directed   metoprolol tartrate 25 MG tablet Commonly known as:  LOPRESSOR Take 0.5 tablets (12.5 mg total) by mouth as needed (for palpitations).   multivitamin tablet Take 1 tablet by mouth daily. Reported on 04/22/2015   multivitamin-lutein Caps capsule Take 1 capsule by mouth daily.   NON FORMULARY Oxygen 2-5 liters 24/7   omeprazole 20 MG capsule Commonly known as:  PRILOSEC Take 20 mg by mouth daily.   pravastatin 40 MG tablet Commonly known as:  PRAVACHOL Take 1 tablet (40 mg total) by mouth daily at 6 PM.   rivaroxaban 20 MG Tabs tablet Commonly known as:  XARELTO Take 1 tablet (20 mg total) by mouth daily with supper. What changed:  medication strength  how much to take  when to take this   tamsulosin 0.4 MG Caps capsule Commonly known as:  FLOMAX Take 2 capsules (0.8 mg total) by mouth daily.   tiotropium 18 MCG inhalation capsule Commonly known as:  SPIRIVA Place 1 capsule (18 mcg total) into inhaler and inhale daily.   traMADol 50 MG tablet Commonly known as:  ULTRAM TAKE 1 TABLET BY MOUTH 12 AS NEEDED FOR PAIN   traZODone 100 MG tablet Commonly known as:  DESYREL TAKE 2 TABLETS (200 MG TOTAL) BY MOUTH AT BEDTIME.   VITAMIN B 12 PO Take by mouth daily.      Follow-up Information    MDennie Bible NP. Schedule an appointment as soon as possible for a visit in 6 week(s).   Specialty:  Family Medicine Contact information: [id number]       GElsie Stain MD Follow up.   Specialty:  Family Medicine Contact information: [place]  [phone]           Time coordinating discharge: 35 min  Signed:  Kinga Cassar UAlison Stalling  [facility] 01/05/2016, 11:36 AM

## 2016-01-05 NOTE — Progress Notes (Signed)
Physical Therapy Treatment Patient Details Name: [name] MRN: [id number] DOB: 02-19-1928 Today's Date: 01/05/2016    History of Present Illness Ptis an 82 y.o.malewith history of atrial fibrillation, COPD, CAD, spontaneous pneumothorax who presents to ER with complaints of blurry vision. Patient's symptoms started around 3 PM after patient woke up from sleep and was watching TV. Patient initially felt some weakness in the lower extremitiesfollowed by the visual symptoms. 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    PT Comments    Pt progressing towards physical therapy goals. Vision appears improved this session and was able to track a little past midline to the L. Overall functioning at a min guard level. Discussed recommendation for HHPT initially with the hope of transitioning to a neuro outpatient rehab when able. Will continue to follow and progress as able per POC.   Follow Up Recommendations  Home health PT;Supervision/Assistance - 24 hour     Equipment Recommendations  Rolling walker with 5" wheels    Recommendations for Other Services       Precautions / Restrictions Precautions Precautions: Fall Restrictions Weight Bearing Restrictions: No    Mobility  Bed Mobility               General bed mobility comments: Pt received sitting up on BSC.  Transfers Overall transfer level: Needs assistance Equipment used: Rolling walker (2 wheeled) Transfers: Sit to/from Stand Sit to Stand: Min guard         General transfer comment: Pt able to power-up to full standing with hands-on guarding for safety but no physical assist provided.  Ambulation/Gait Ambulation/Gait assistance: Min assist Ambulation Distance (Feet): 275 Feet (175', 100') Assistive device: Rolling walker (2 wheeled) Gait Pattern/deviations: Step-through pattern;Decreased stride length;Trunk flexed Gait velocity: Decreased Gait velocity  interpretation: Below normal speed for age/gender General Gait Details: Pt was able to ambulate in hall x2 bouts. First bout was for distance and second bout was to assess vision while ambulating. Pt was cued to read signs, point out colors, and objects on the L side as he ambulated. Increased time required for pt to focus on and read signs on the L side, but able to multitask fairly well.    Stairs            Wheelchair Mobility    Modified Rankin (Stroke Patients Only) Modified Rankin (Stroke Patients Only) Pre-Morbid Rankin Score: Slight disability Modified Rankin: Moderately severe disability     Balance Overall balance assessment: Needs assistance Sitting-balance support: Feet supported;No upper extremity supported Sitting balance-Leahy Scale: Fair     Standing balance support: Single extremity supported;During functional activity Standing balance-Leahy Scale: Poor Standing balance comment: Requires hands on assist for safety.                     Cognition Arousal/Alertness: Awake/alert Behavior During Therapy: WFL for tasks assessed/performed Overall Cognitive Status: Within Functional Limits for tasks assessed                      Exercises      General Comments General comments (skin integrity, edema, etc.): Noted R gaze preference. Pt was able to track a little past midline and continued to have difficulty looking beyond to the L. Peripheral vision decreased on the L side vs. the R.       Pertinent Vitals/Pain Pain Assessment: No/denies pain    Home Living  Prior Function            PT Goals (current goals can now be found in the care plan section) Acute Rehab PT Goals Patient Stated Goal: Get better PT Goal Formulation: With patient/family Time For Goal Achievement: 01/11/16 Potential to Achieve Goals: Good Progress towards PT goals: Progressing toward goals    Frequency    Min 3X/week      PT  Plan Current plan remains appropriate    Co-evaluation             End of Session Equipment Utilized During Treatment: Gait belt Activity Tolerance: Patient limited by fatigue Patient left: in chair;with chair alarm set;with call bell/phone within reach;with family/visitor present     Time: 0828-0859 PT Time Calculation (min) (ACUTE ONLY): 31 min  Charges:  $Gait Training: 23-37 mins                    G Codes:      Thelma Comp 2016/01/31, 12:09 PM   Rolinda Roan, PT, DPT Acute Rehabilitation Services Pager: [phone]

## 2016-01-06 NOTE — Care Management Note (Signed)
Case Management Note  Patient Details  Name: SADIK PIASCIK MRN: [id number] Date of Birth: 03-17-1928  Subjective/Objective:                    Action/Plan: 01/06/2016 at 1520: Interim unable to provide OT for the patient. CM called and spoke to Mrs [facility] and informed her. She asked to use [facility]. Santiago Glad with [facility] notified and accepted the referral.   Expected Discharge Date:                  Expected Discharge Plan:  [place]  In-House Referral:     Discharge planning Services  CM Consult  Post Acute Care Choice:  Durable Medical Equipment, Home Health Choice offered to:  Patient, Spouse  DME Arranged:  Walker rolling DME Agency:  [place] Arranged:  PT, OT [facility] Agency:  [place]  Status of Service:  Completed, signed off  If discussed at [place] of Stay Meetings, dates discussed:    Additional Comments:  Pollie Friar, RN 01/06/2016, 3:18 PM

## 2016-01-06 NOTE — Telephone Encounter (Signed)
01/04/16 4:07 PM  Ms Vannest called today. Mr Kakos is being tested for a possible stroke on 01/03/16. He will return as soon as possible to [place].

## 2016-01-07 ENCOUNTER — Telehealth: Payer: Self-pay

## 2016-01-07 NOTE — Telephone Encounter (Signed)
Transition Care Management Follow-up Telephone Call   Date discharged? 01/05/2016      How have you been since you were released from the hospital?  Using a walker now, a little hard to get around.   Do you understand why you were in the hospital?  Yes, may have had a stroke.   Do you understand the discharge instructions? Yes, but wife is really helping with the instructions.   Where were you discharged to? Home   Items Reviewed:  Medications reviewed: Yes, questions to discuss with MD next week at follow up.  Allergies reviewed: Yes  Dietary changes reviewed: Yes, no changes  Referrals reviewed: Yes, appointment with [facility] Neurology on 04/07/16 with Cecille Rubin.    Functional Questionnaire:   Activities of Daily Living (ADLs):   He is independent with feeding and with an assistive device walker/cane for ambulation. Wife reports patient receives minimal 1 person assist to standby assist with grooming, toileting, dressing and bathing.  He is [place].     Any transportation issues/concerns?: No, wife manages.   Any patient concerns? No   Confirmed importance and date/time of follow-up visits scheduled.  Patient has follow up with PCP on 01/14/16.  Provider Appointment booked with Dr. Damita Dunnings 01/14/16 at 3:00pm.  Confirmed with patient if condition begins to worsen call PCP or go to the ER.  Patient was given the office number and encouraged to call back with question or concerns.   Yes, instructions and call back information given to wife.  Wife verbalizes understanding.

## 2016-01-08 ENCOUNTER — Other Ambulatory Visit: Payer: Self-pay | Admitting: *Deleted

## 2016-01-08 NOTE — Patient Outreach (Signed)
[place]) Care Management  01/08/2016  Rahiem Schellinger [facility] 09-27-1928 [id number]  Referral via EMMI-stroke dashboard -red on new prescription.  Telephone call to patient who was advised of reason for call. Patient referred call to spouse to take call for him. Hippa verification received from spouse.   Spouse voices that patient had recent hospital stay after having symptoms of weakness & visual problem. States stroke was diagnosed. States patient is able to walk with walker and speech is much improved. Has not experienced any new stroke symptoms since discharged to home. Voices that she will call 911 if any occur.   States she is managing patient's medications. Spouse voices that she faxed new prescription for Xarelto 20 mg to [facility] clinic in [place] because medication too expensive at local pharmacy. States he continues to take Xarelto 15 mg daily till prescription is filled at [place]. Advised of importance of taking medications as prescribed by MD. Spouse/caregiver states she advised primary care doctor's nurse of the above and that patient will have follow up appointment next week-12/28.  States she will provide transportation to MD appointments.    Voices that patient will have home health services provided by [facility] and that agency will call today to see up home visit appointment. States follow up appointment with neurologist has been set up.  Emmi call completed.   Plan: Close case. Sherrin Daisy, RN BSN [place] Management Coordinator [facility] Care Management  [phone]

## 2016-01-13 DIAGNOSIS — I739 Peripheral vascular disease, unspecified: Secondary | ICD-10-CM | POA: Diagnosis not present

## 2016-01-13 DIAGNOSIS — I480 Paroxysmal atrial fibrillation: Secondary | ICD-10-CM | POA: Diagnosis not present

## 2016-01-13 DIAGNOSIS — Z7951 Long term (current) use of inhaled steroids: Secondary | ICD-10-CM | POA: Diagnosis not present

## 2016-01-13 DIAGNOSIS — I2510 Atherosclerotic heart disease of native coronary artery without angina pectoris: Secondary | ICD-10-CM | POA: Diagnosis not present

## 2016-01-13 DIAGNOSIS — I69398 Other sequelae of cerebral infarction: Secondary | ICD-10-CM | POA: Diagnosis not present

## 2016-01-13 DIAGNOSIS — Z7901 Long term (current) use of anticoagulants: Secondary | ICD-10-CM | POA: Diagnosis not present

## 2016-01-13 DIAGNOSIS — E785 Hyperlipidemia, unspecified: Secondary | ICD-10-CM | POA: Diagnosis not present

## 2016-01-13 DIAGNOSIS — J449 Chronic obstructive pulmonary disease, unspecified: Secondary | ICD-10-CM | POA: Diagnosis not present

## 2016-01-13 DIAGNOSIS — M6281 Muscle weakness (generalized): Secondary | ICD-10-CM | POA: Diagnosis not present

## 2016-01-14 ENCOUNTER — Ambulatory Visit (INDEPENDENT_AMBULATORY_CARE_PROVIDER_SITE_OTHER): Payer: Medicare Other | Admitting: Family Medicine

## 2016-01-14 ENCOUNTER — Ambulatory Visit (INDEPENDENT_AMBULATORY_CARE_PROVIDER_SITE_OTHER)
Admission: RE | Admit: 2016-01-14 | Discharge: 2016-01-14 | Disposition: A | Discharge status: Home / Self Care | Payer: Medicare Other | Source: Ambulatory Visit | Attending: Family Medicine | Admitting: Family Medicine

## 2016-01-14 ENCOUNTER — Encounter: Payer: Self-pay | Admitting: Family Medicine

## 2016-01-14 VITALS — BP 124/58 | HR 110 | Temp 97.7°F | Wt 166.0 lb

## 2016-01-14 DIAGNOSIS — E784 Other hyperlipidemia: Secondary | ICD-10-CM

## 2016-01-14 DIAGNOSIS — J449 Chronic obstructive pulmonary disease, unspecified: Secondary | ICD-10-CM | POA: Diagnosis not present

## 2016-01-14 DIAGNOSIS — I480 Paroxysmal atrial fibrillation: Secondary | ICD-10-CM

## 2016-01-14 DIAGNOSIS — Z8673 Personal history of transient ischemic attack (TIA), and cerebral infarction without residual deficits: Secondary | ICD-10-CM | POA: Diagnosis not present

## 2016-01-14 DIAGNOSIS — E876 Hypokalemia: Secondary | ICD-10-CM

## 2016-01-14 DIAGNOSIS — J9611 Chronic respiratory failure with hypoxia: Secondary | ICD-10-CM | POA: Diagnosis not present

## 2016-01-14 DIAGNOSIS — E7849 Other hyperlipidemia: Secondary | ICD-10-CM

## 2016-01-14 DIAGNOSIS — J90 Pleural effusion, not elsewhere classified: Secondary | ICD-10-CM | POA: Diagnosis not present

## 2016-01-14 DIAGNOSIS — I639 Cerebral infarction, unspecified: Secondary | ICD-10-CM

## 2016-01-14 MED ORDER — PRAVASTATIN SODIUM 40 MG PO TABS: 20.0000 mg | ORAL_TABLET | ORAL | 0 refills | Status: DC

## 2016-01-14 MED ORDER — RIVAROXABAN 20 MG PO TABS: 20.0000 mg | ORAL_TABLET | Freq: Every day | ORAL | 0 refills | Status: DC

## 2016-01-14 MED ORDER — PRAVASTATIN SODIUM 40 MG PO TABS: 20.0000 mg | ORAL_TABLET | ORAL | 0 refills | Status: AC

## 2016-01-14 NOTE — Patient Instructions (Addendum)
Go to the lab on the way out.  We'll contact you with your lab and xray report. Start back on pravastatin 1/2 tab twice a week.  See if you can tolerate gradually increasing the dose.   Take care.  Glad to see you.

## 2016-01-14 NOTE — Progress Notes (Signed)
Pre visit review using our clinic review tool, if applicable. No additional management support is needed unless otherwise documented below in the visit note. 

## 2016-01-14 NOTE — Progress Notes (Signed)
Admit date: 01/03/2016 Discharge date: 01/05/2016   Recommendations for Outpatient Follow-Up:   1. BMP 1 week 2. Home health with 24 hour supervision 3. Repeat x ray   Discharge Diagnosis:   Principal Problem:   Acute embolic stroke ([name]) Active Problems:   HLD (hyperlipidemia)   PAF (paroxysmal atrial fibrillation)    PAD (peripheral artery disease) ([name])   Stroke (cerebrum) [name])   Discharge disposition:  Home.   Discharge Condition: Improved.  Diet recommendation: Low sodium, heart healthy.  Carbohydrate-modified.  Wound care: None.   History of Present Illness:   [name] a 82 y.o.malewith history of atrial fibrillation, COPD, CAD, history of spontaneous pneumothorax presents to ER with complaints of patient having blurry vision. Patient's symptoms started around 3 PM after patient woke up from sleep and was watching TV. Patient initially felt some weakness in the lower extremitiesfollowed by the visual symptoms. On exam patient has been having right-sided gaze preference. Patient was able to move all extremities without difficulty. Neurologist on-call was consulted. 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 This was followed by CT angiogram of the head and neck since patient was unable to have MRI. Patient is being admitted for further stroke workup. Patient takes Xarelto for A. fib and has not taken his dose today.   Hospital Course by Problem:   Acute embolic stroke- appreciate neurology consult.  -2-D echo:  Left ventricle: The cavity size was normal. Wall thickness was increased in a pattern of mild LVH. Systolic function was normal. The estimated ejection fraction was in the range of 60% to 65%. Doppler parameters are consistent with abnormal left ventricular relaxation (grade 1 diastolic dysfunction). - Mitral valve: There was mild regurgitation. - Left atrium: The  atrium was mildly dilated -hemoglobin A1c: 5.4 -lipid panel: LDL 138-- allergy to statin but will try -physical therapy consult- home health -EEG- normal  Paroxysmal atrial fibrillation- continue metoprolol.  -resume xarelto but at a higher dose (20 mg vs 15 mg)  COPD- not actively wheezing. Continue home inhalers. -on O2 at home  History of CAD- denies any chest pain.  History of recurrent pneumothorax - denies any shortness of breath- x ray shows small left pleural effusion  Hypokalemia -replace   --------------------------------------------------------------------- To ER after focal neuro changes with CT noted.  Wife noted both eyes deviated to the R.  Prev vision changes resolved now.  He has no focal neurologic symptoms at this point.  Inpatient course discussed with patient, especially imaging regarding stroke and pleural effusion.  It was assumed that his stroke was related A. fib. He is still treated with a beta blocker.  The plan was to change him to 20 mg of Xarelto. He has had trouble getting xarelto 9m and has still been on 131min the meantime daily.  He has not started back on statin in the meantime, discussed patient about options. He was trying to avoid muscle aches from statin. See after visit summary.  As far as his pulmonary disease is concerned, he is still on oxygen. He is due for follow-up chest x-ray about his pulmonary effusion.  PMH and SH reviewed  ROS: Per HPI unless specifically indicated in ROS section   Meds, vitals, and allergies reviewed.   GEN: nad, alert and oriented HEENT: mucous membranes moist NECK: supple w/o LA CV: IRR PULM: ctab, no inc wob ABD: soft, +bs EXT: no edema SKIN: no acute rash

## 2016-01-15 DIAGNOSIS — G629 Polyneuropathy, unspecified: Secondary | ICD-10-CM | POA: Diagnosis not present

## 2016-01-15 DIAGNOSIS — J90 Pleural effusion, not elsewhere classified: Secondary | ICD-10-CM | POA: Insufficient documentation

## 2016-01-15 LAB — BASIC METABOLIC PANEL
BUN: 20 mg/dL (ref 6–23)
CHLORIDE: 103 meq/L (ref 96–112)
CO2: 29 meq/L (ref 19–32)
CREATININE: 1.15 mg/dL (ref 0.40–1.50)
Calcium: 9.3 mg/dL (ref 8.4–10.5)
GFR: 64.11 mL/min (ref >60.00)
GLUCOSE: 115 mg/dL — AB (ref 70–99)
Potassium: 4.2 mEq/L (ref 3.5–5.1)
Sodium: 141 mEq/L (ref 135–145)

## 2016-01-15 NOTE — Assessment & Plan Note (Signed)
Irregular rate and rhythm. Continue beta blocker. I did not increase his beta blocker today since his blood pressure was not elevated.

## 2016-01-15 NOTE — Assessment & Plan Note (Addendum)
Prescription written for Xarelto 20 mg for him to get it locally. He is try to press check this and get it refilled long-term through the [facility]. Continue beta blocker. No acute neurologic changes at this point. Recheck basic labs today.

## 2016-01-15 NOTE — Assessment & Plan Note (Signed)
Continue oxygen.

## 2016-01-15 NOTE — Assessment & Plan Note (Signed)
Tried taking statin twice a week, half tablet twice a week. He can uptitrate from there as tolerated. He agrees.

## 2016-01-15 NOTE — Assessment & Plan Note (Signed)
History of. Recheck chest x-ray today. Lungs are clear today. See notes on x-ray

## 2016-01-19 ENCOUNTER — Other Ambulatory Visit: Payer: Self-pay | Admitting: *Deleted

## 2016-01-19 DIAGNOSIS — Z7951 Long term (current) use of inhaled steroids: Secondary | ICD-10-CM | POA: Diagnosis not present

## 2016-01-19 DIAGNOSIS — I739 Peripheral vascular disease, unspecified: Secondary | ICD-10-CM | POA: Diagnosis not present

## 2016-01-19 DIAGNOSIS — J449 Chronic obstructive pulmonary disease, unspecified: Secondary | ICD-10-CM | POA: Diagnosis not present

## 2016-01-19 DIAGNOSIS — I69398 Other sequelae of cerebral infarction: Secondary | ICD-10-CM | POA: Diagnosis not present

## 2016-01-19 DIAGNOSIS — M6281 Muscle weakness (generalized): Secondary | ICD-10-CM | POA: Diagnosis not present

## 2016-01-19 DIAGNOSIS — Z7901 Long term (current) use of anticoagulants: Secondary | ICD-10-CM | POA: Diagnosis not present

## 2016-01-19 DIAGNOSIS — I480 Paroxysmal atrial fibrillation: Secondary | ICD-10-CM | POA: Diagnosis not present

## 2016-01-19 DIAGNOSIS — E785 Hyperlipidemia, unspecified: Secondary | ICD-10-CM | POA: Diagnosis not present

## 2016-01-19 DIAGNOSIS — I2510 Atherosclerotic heart disease of native coronary artery without angina pectoris: Secondary | ICD-10-CM | POA: Diagnosis not present

## 2016-01-19 NOTE — Patient Outreach (Signed)
[facility]) Care Management  01/19/2016  [name] [facility] Feb 22, 1928 [id number]  Emmi-Stroke referral with red dashboard for feeling worse overall.  Telephone call to patient who voices that he is feeling ok. States he had doctor's appointment 01/14/2016. Spouse states patient has not had any further stroke symptoms & has not had to go to emergency room or be admitted since last admission for stroke.  Spouse/caregiver states she is aware of stroke symptoms & plans to call 911 of they occur.  Voices that she manges patient's medication. Voices that primary care gave patient prescription for Xarelto 20 mg for 30 days which she filled and started today. States she has not received medication from [facility] yet. Encouraged  caregiver[facility]spouse to [facility] to advise that patient only has 30 day supply & to request when medication would be sent. Caregiver voices understanding & states she will call to inquire of estimated date of arrival.   States home services for patient have started.    EMMI-call completed. Will close case.  Sherrin Daisy, RN BSN CCM Care Management Coordinator [facility] Care Management  [phone]   EMMI call completed

## 2016-01-20 ENCOUNTER — Telehealth: Payer: Self-pay | Admitting: Respiratory Therapy

## 2016-01-20 ENCOUNTER — Encounter: Payer: Self-pay | Admitting: Respiratory Therapy

## 2016-01-20 ENCOUNTER — Telehealth: Payer: Self-pay | Admitting: Pulmonary Disease

## 2016-01-20 ENCOUNTER — Encounter: Payer: Medicare Other | Attending: Pulmonary Disease

## 2016-01-20 DIAGNOSIS — M6281 Muscle weakness (generalized): Secondary | ICD-10-CM | POA: Diagnosis not present

## 2016-01-20 DIAGNOSIS — J9611 Chronic respiratory failure with hypoxia: Secondary | ICD-10-CM | POA: Insufficient documentation

## 2016-01-20 DIAGNOSIS — J449 Chronic obstructive pulmonary disease, unspecified: Secondary | ICD-10-CM | POA: Diagnosis not present

## 2016-01-20 DIAGNOSIS — J439 Emphysema, unspecified: Secondary | ICD-10-CM | POA: Insufficient documentation

## 2016-01-20 DIAGNOSIS — I2510 Atherosclerotic heart disease of native coronary artery without angina pectoris: Secondary | ICD-10-CM | POA: Diagnosis not present

## 2016-01-20 DIAGNOSIS — Z7901 Long term (current) use of anticoagulants: Secondary | ICD-10-CM | POA: Diagnosis not present

## 2016-01-20 DIAGNOSIS — I69398 Other sequelae of cerebral infarction: Secondary | ICD-10-CM | POA: Diagnosis not present

## 2016-01-20 DIAGNOSIS — E785 Hyperlipidemia, unspecified: Secondary | ICD-10-CM | POA: Diagnosis not present

## 2016-01-20 DIAGNOSIS — I739 Peripheral vascular disease, unspecified: Secondary | ICD-10-CM | POA: Diagnosis not present

## 2016-01-20 DIAGNOSIS — I480 Paroxysmal atrial fibrillation: Secondary | ICD-10-CM | POA: Diagnosis not present

## 2016-01-20 DIAGNOSIS — Z7951 Long term (current) use of inhaled steroids: Secondary | ICD-10-CM | POA: Diagnosis not present

## 2016-01-20 NOTE — Telephone Encounter (Signed)
Spoke with Sherlynn Stalls with [facility]. States that she is currently at the pt's home. His oxygen concentrator is on 6L. Pt's oxygen level is 98% on 6L. Sherlynn Stalls wants to know if pt needs to continue 6L or can this be decreased.  BQ - please advise. Thanks.

## 2016-01-20 NOTE — Telephone Encounter (Signed)
Spoke with [employer]. She is aware of BQ's recommendation. Nothing further was needed.

## 2016-01-20 NOTE — Telephone Encounter (Signed)
Go to 5L O2 at rest, 6L O2 with exertion.
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
## 2016-01-21 DIAGNOSIS — J449 Chronic obstructive pulmonary disease, unspecified: Secondary | ICD-10-CM | POA: Diagnosis not present

## 2016-01-21 DIAGNOSIS — Z7901 Long term (current) use of anticoagulants: Secondary | ICD-10-CM | POA: Diagnosis not present

## 2016-01-21 DIAGNOSIS — I69398 Other sequelae of cerebral infarction: Secondary | ICD-10-CM | POA: Diagnosis not present

## 2016-01-21 DIAGNOSIS — I2510 Atherosclerotic heart disease of native coronary artery without angina pectoris: Secondary | ICD-10-CM | POA: Diagnosis not present

## 2016-01-21 DIAGNOSIS — Z7951 Long term (current) use of inhaled steroids: Secondary | ICD-10-CM | POA: Diagnosis not present

## 2016-01-21 DIAGNOSIS — I739 Peripheral vascular disease, unspecified: Secondary | ICD-10-CM | POA: Diagnosis not present

## 2016-01-21 DIAGNOSIS — I480 Paroxysmal atrial fibrillation: Secondary | ICD-10-CM | POA: Diagnosis not present

## 2016-01-21 DIAGNOSIS — M6281 Muscle weakness (generalized): Secondary | ICD-10-CM | POA: Diagnosis not present

## 2016-01-21 DIAGNOSIS — E785 Hyperlipidemia, unspecified: Secondary | ICD-10-CM | POA: Diagnosis not present

## 2016-01-25 DIAGNOSIS — I739 Peripheral vascular disease, unspecified: Secondary | ICD-10-CM | POA: Diagnosis not present

## 2016-01-25 DIAGNOSIS — E785 Hyperlipidemia, unspecified: Secondary | ICD-10-CM | POA: Diagnosis not present

## 2016-01-25 DIAGNOSIS — I480 Paroxysmal atrial fibrillation: Secondary | ICD-10-CM | POA: Diagnosis not present

## 2016-01-25 DIAGNOSIS — I69398 Other sequelae of cerebral infarction: Secondary | ICD-10-CM | POA: Diagnosis not present

## 2016-01-25 DIAGNOSIS — I2510 Atherosclerotic heart disease of native coronary artery without angina pectoris: Secondary | ICD-10-CM | POA: Diagnosis not present

## 2016-01-25 DIAGNOSIS — Z7901 Long term (current) use of anticoagulants: Secondary | ICD-10-CM | POA: Diagnosis not present

## 2016-01-25 DIAGNOSIS — M6281 Muscle weakness (generalized): Secondary | ICD-10-CM | POA: Diagnosis not present

## 2016-01-25 DIAGNOSIS — J449 Chronic obstructive pulmonary disease, unspecified: Secondary | ICD-10-CM | POA: Diagnosis not present

## 2016-01-25 DIAGNOSIS — Z7951 Long term (current) use of inhaled steroids: Secondary | ICD-10-CM | POA: Diagnosis not present

## 2016-01-26 ENCOUNTER — Ambulatory Visit (INDEPENDENT_AMBULATORY_CARE_PROVIDER_SITE_OTHER): Payer: Medicare Other | Admitting: Podiatry

## 2016-01-26 ENCOUNTER — Encounter: Payer: Self-pay | Admitting: Podiatry

## 2016-01-26 VITALS — Ht 69.0 in | Wt 166.0 lb

## 2016-01-26 DIAGNOSIS — M79676 Pain in unspecified toe(s): Secondary | ICD-10-CM

## 2016-01-26 DIAGNOSIS — I480 Paroxysmal atrial fibrillation: Secondary | ICD-10-CM | POA: Diagnosis not present

## 2016-01-26 DIAGNOSIS — E785 Hyperlipidemia, unspecified: Secondary | ICD-10-CM | POA: Diagnosis not present

## 2016-01-26 DIAGNOSIS — B351 Tinea unguium: Secondary | ICD-10-CM

## 2016-01-26 DIAGNOSIS — Z7901 Long term (current) use of anticoagulants: Secondary | ICD-10-CM | POA: Diagnosis not present

## 2016-01-26 DIAGNOSIS — I739 Peripheral vascular disease, unspecified: Secondary | ICD-10-CM | POA: Diagnosis not present

## 2016-01-26 DIAGNOSIS — Z7951 Long term (current) use of inhaled steroids: Secondary | ICD-10-CM | POA: Diagnosis not present

## 2016-01-26 DIAGNOSIS — I2510 Atherosclerotic heart disease of native coronary artery without angina pectoris: Secondary | ICD-10-CM | POA: Diagnosis not present

## 2016-01-26 DIAGNOSIS — J449 Chronic obstructive pulmonary disease, unspecified: Secondary | ICD-10-CM | POA: Diagnosis not present

## 2016-01-26 DIAGNOSIS — I69398 Other sequelae of cerebral infarction: Secondary | ICD-10-CM | POA: Diagnosis not present

## 2016-01-26 DIAGNOSIS — M6281 Muscle weakness (generalized): Secondary | ICD-10-CM | POA: Diagnosis not present

## 2016-01-26 NOTE — Progress Notes (Signed)
Patient ID: Niv Darley [facility], male   DOB: 06-03-1928, 83 y.o.   MRN: [id number] Complaint:  Visit Type: Patient returns to my office for continued preventative foot care services. Complaint: Patient states" my nails have grown long and thick and become painful to walk and wear shoes" Patient has been diagnosed with glucose intolerance with neuropathy.. The patient presents for preventative foot care services. No changes to ROS  Podiatric Exam: Vascular: dorsalis pedis and posterior tibial pulses are palpable bilateral. Capillary return is immediate. Temperature gradient is WNL. Skin turgor WNL  Sensorium: Normal Semmes Weinstein monofilament test. Normal tactile sensation bilaterally. Nail Exam: Pt has thick disfigured discolored nails with subungual debris noted bilateral entire nail hallux through fifth toenails Ulcer Exam: There is no evidence of ulcer or pre-ulcerative changes or infection. Orthopedic Exam: Muscle tone and strength are WNL. No limitations in general ROM. No crepitus or effusions noted. HAV  B/L with overlapping second B/L Skin: No Porokeratosis. No infection or ulcers.    Diagnosis:  Onychomycosis, , Pain in right toe, pain in left toes  Treatment & Plan Procedures and Treatment: Consent by patient was obtained for treatment procedures. The patient understood the discussion of treatment and procedures well. All questions were answered thoroughly reviewed. Debridement of mycotic and hypertrophic toenails, 1 through 5 bilateral and clearing of subungual debris. No ulceration, no infection noted.   Return Visit-Office Procedure: Patient instructed to return to the office for a follow up visit 10 weeks  for continued evaluation and treatment.    Gardiner Barefoot DPM

## 2016-01-28 DIAGNOSIS — Z7901 Long term (current) use of anticoagulants: Secondary | ICD-10-CM | POA: Diagnosis not present

## 2016-01-28 DIAGNOSIS — J449 Chronic obstructive pulmonary disease, unspecified: Secondary | ICD-10-CM | POA: Diagnosis not present

## 2016-01-28 DIAGNOSIS — I2510 Atherosclerotic heart disease of native coronary artery without angina pectoris: Secondary | ICD-10-CM | POA: Diagnosis not present

## 2016-01-28 DIAGNOSIS — I480 Paroxysmal atrial fibrillation: Secondary | ICD-10-CM | POA: Diagnosis not present

## 2016-01-28 DIAGNOSIS — Z7951 Long term (current) use of inhaled steroids: Secondary | ICD-10-CM | POA: Diagnosis not present

## 2016-01-28 DIAGNOSIS — I69398 Other sequelae of cerebral infarction: Secondary | ICD-10-CM | POA: Diagnosis not present

## 2016-01-28 DIAGNOSIS — E785 Hyperlipidemia, unspecified: Secondary | ICD-10-CM | POA: Diagnosis not present

## 2016-01-28 DIAGNOSIS — M6281 Muscle weakness (generalized): Secondary | ICD-10-CM | POA: Diagnosis not present

## 2016-01-28 DIAGNOSIS — I739 Peripheral vascular disease, unspecified: Secondary | ICD-10-CM | POA: Diagnosis not present

## 2016-01-29 DIAGNOSIS — M6281 Muscle weakness (generalized): Secondary | ICD-10-CM | POA: Diagnosis not present

## 2016-01-29 DIAGNOSIS — I480 Paroxysmal atrial fibrillation: Secondary | ICD-10-CM | POA: Diagnosis not present

## 2016-01-29 DIAGNOSIS — E785 Hyperlipidemia, unspecified: Secondary | ICD-10-CM | POA: Diagnosis not present

## 2016-01-29 DIAGNOSIS — Z7951 Long term (current) use of inhaled steroids: Secondary | ICD-10-CM | POA: Diagnosis not present

## 2016-01-29 DIAGNOSIS — I739 Peripheral vascular disease, unspecified: Secondary | ICD-10-CM | POA: Diagnosis not present

## 2016-01-29 DIAGNOSIS — I2510 Atherosclerotic heart disease of native coronary artery without angina pectoris: Secondary | ICD-10-CM | POA: Diagnosis not present

## 2016-01-29 DIAGNOSIS — I69398 Other sequelae of cerebral infarction: Secondary | ICD-10-CM | POA: Diagnosis not present

## 2016-01-29 DIAGNOSIS — Z7901 Long term (current) use of anticoagulants: Secondary | ICD-10-CM | POA: Diagnosis not present

## 2016-01-29 DIAGNOSIS — J449 Chronic obstructive pulmonary disease, unspecified: Secondary | ICD-10-CM | POA: Diagnosis not present

## 2016-02-01 ENCOUNTER — Encounter: Payer: Self-pay | Admitting: Respiratory Therapy

## 2016-02-01 DIAGNOSIS — J479 Bronchiectasis, uncomplicated: Secondary | ICD-10-CM

## 2016-02-01 DIAGNOSIS — J9611 Chronic respiratory failure with hypoxia: Secondary | ICD-10-CM

## 2016-02-01 DIAGNOSIS — J449 Chronic obstructive pulmonary disease, unspecified: Secondary | ICD-10-CM

## 2016-02-01 NOTE — Progress Notes (Signed)
Pulmonary Individual Treatment Plan  Patient Details  Name: [name] MRN: [id number] Date of Birth: 1928-12-06 Referring Provider:   Flowsheet Row Pulmonary Rehab from 10/27/2015 in [facility] Cardiac and Pulmonary Rehab  Referring Provider  Simonne Maffucci MD      Initial Encounter Date:  Flowsheet Row Pulmonary Rehab from 10/27/2015 in [facility] Cardiac and Pulmonary Rehab  Date  10/27/15  Referring Provider  Simonne Maffucci MD      Visit Diagnosis: COPD, mild ([place])  Bronchiectasis without complication ([place])  Chronic respiratory failure with hypoxia ([place])  Patient's Home Medications on Admission:  Current Outpatient Prescriptions:    acetaminophen (TYLENOL) 325 MG tablet, Take 650 mg by mouth every 6 (six) hours as needed for mild pain. , Disp: , Rfl:    albuterol (PROAIR HFA) 108 (90 Base) MCG/ACT inhaler, Inhale 2 puffs into the lungs every 4 (four) hours as needed for wheezing or shortness of breath., Disp: 1 Inhaler, Rfl: 2   Cyanocobalamin (VITAMIN B 12 PO), Take by mouth daily., Disp: , Rfl:    FIBER PO, Take 1 capsule by mouth 2 (two) times daily., Disp: , Rfl:    fluticasone (FLONASE) 50 MCG/ACT nasal spray, Place 2 sprays into both nostrils daily. (Patient taking differently: Place 2 sprays into both nostrils daily as needed for allergies. ), Disp: , Rfl:    metoprolol tartrate (LOPRESSOR) 25 MG tablet, Take 0.5 tablets (12.5 mg total) by mouth as needed (for palpitations)., Disp: , Rfl:    Multiple Vitamin (MULTIVITAMIN) tablet, Take 1 tablet by mouth daily. Reported on 04/22/2015, Disp: , Rfl:    multivitamin-lutein (OCUVITE-LUTEIN) CAPS capsule, Take 1 capsule by mouth daily., Disp: , Rfl:    NON FORMULARY, Oxygen 2-5 liters 24/7, Disp: , Rfl:    omeprazole (PRILOSEC) 20 MG capsule, Take 20 mg by mouth daily.  , Disp: , Rfl:    pravastatin (PRAVACHOL) 40 MG tablet, Take 0.5 tablets (20 mg total) by mouth 2 (two) times a week., Disp: 30 tablet, Rfl: 0    Respiratory Therapy Supplies (FLUTTER) DEVI, Use as directed, Disp: 1 each, Rfl: 0   rivaroxaban (XARELTO) 20 MG TABS tablet, Take 1 tablet (20 mg total) by mouth daily with supper., Disp: 30 tablet, Rfl: 0   tamsulosin (FLOMAX) 0.4 MG CAPS capsule, Take 2 capsules (0.8 mg total) by mouth daily., Disp: 180 capsule, Rfl: 3   tiotropium (SPIRIVA) 18 MCG inhalation capsule, Place 1 capsule (18 mcg total) into inhaler and inhale daily., Disp: 30 capsule, Rfl: 6   traMADol (ULTRAM) 50 MG tablet, TAKE 1 TABLET BY MOUTH 12 AS NEEDED FOR PAIN, Disp: 60 tablet, Rfl: 2   traZODone (DESYREL) 100 MG tablet, TAKE 2 TABLETS (200 MG TOTAL) BY MOUTH AT BEDTIME., Disp: 180 tablet, Rfl: 3  Past Medical History: Past Medical History:  Diagnosis Date   AF (atrial fibrillation) ([place])    after PTX 2012   Allergy    Arthritis    h/o R shoulder injection per ortho   Back pain    BPH (benign prostatic hyperplasia)    COPD (chronic obstructive pulmonary disease) (H[facility])    Coronary artery disease    Elevated MCV    Erectile dysfunction    GERD (gastroesophageal reflux disease)    HSV-1 (herpes simplex virus 1) infection    Hyperlipidemia    Spontaneous pneumothorax 09/04/2010   [name]    Tobacco Use: History  Smoking Status   Former Smoker   Packs/day: 1.00   Years: 50.00  Types: Cigarettes   Quit date: 04/14/1999  Smokeless Tobacco   Never Used    Labs: Recent Review Flowsheet Data    Labs for ITP Cardiac and Pulmonary Rehab Latest Ref Rng & Units 05/19/2014 09/15/2014 11/26/2015 01/03/2016 01/04/2016   Cholestrol 0 - 200 mg/dL 188 - 199 - 192   LDLCALC 0 - 99 mg/dL 132(H) - 124(H) - 138(H)   LDLDIRECT mg/dL - - - - -   HDL >40 mg/dL 31.50(L) - 40.60 - 36(L)   Trlycerides <150 mg/dL 124.0 - 172.0(H) - 90   Hemoglobin A1c 4.8 - 5.6 % - 5.7 5.4 - 5.4   PHART 7.350 - 7.450 - - - - -   PCO2ART 35.0 - 45.0 mmHg - - - - -   HCO3 20.0 - 24.0 mEq/L - - - - -   TCO2 0 - 100  mmol/L - - - 26 -   ACIDBASEDEF 0.0 - 2.0 mmol/L - - - - -   O2SAT % - - - - -       ADL UCSD:     Pulmonary Assessment Scores    Row Name 10/27/15 1435 12/23/15 1317 12/30/15 0753     ADL UCSD   ADL Phase Entry Mid Mid   SOB Score total 77 91  --   Rest 0 1  --   Walk 3 3  --   Stairs 4 5  --   Bath 5 4  --   Dress 3 4  --   Shop 3 4  --      Pulmonary Function Assessment:     Pulmonary Function Assessment - 10/27/15 1434      Initial Spirometry Results   FVC% 102 %   FEV1% 79 %   FEV1/FVC Ratio 54   Comments Test done 09/11/15     Post Bronchodilator Spirometry Results   FVC% 103 %   FEV1% 86 %   FEV1/FVC Ratio 58     Breath   Bilateral Breath Sounds Clear;Decreased   Shortness of Breath Yes;Limiting activity;Fear of Shortness of Breath      Exercise Target Goals:    Exercise Program Goal: Individual exercise prescription set with THRR, safety & activity barriers. Participant demonstrates ability to understand and report RPE using BORG scale, to self-measure pulse accurately, and to acknowledge the importance of the exercise prescription.  Exercise Prescription Goal: Starting with aerobic activity 30 plus minutes a day, 3 days per week for initial exercise prescription. Provide home exercise prescription and guidelines that participant acknowledges understanding prior to discharge.  Activity Barriers & Risk Stratification:     Activity Barriers & Cardiac Risk Stratification - 10/27/15 1433      Activity Barriers & Cardiac Risk Stratification   Activity Barriers Shortness of Breath;Deconditioning;Joint Problems   Cardiac Risk Stratification Moderate      6 Minute Walk:     6 Minute Walk    Row Name 10/27/15 1438 12/23/15 1318       6 Minute Walk   Phase Initial Mid Program    Distance 220 feet 460 feet    Walk Time 2.25 minutes 4.27 minutes    # of Rest Breaks 2  12 sec, 3:33 5  20 sec, 10 sec, 24 sec, 20 sec, 30 sec    MPH 1.11 1.22     METS 1.84 1.9    RPE 13 13    Perceived Dyspnea  3 4    VO2 Peak 1.33 3.36  Symptoms No Yes (comment)    Comments  -- SOB    Resting HR 81 bpm 88 bpm    Resting BP 134/64 126/70    Max Ex. HR 89 bpm 113 bpm    Max Ex. BP 136/74 134/66    2 Minute Post BP 136/64 126/64      Interval HR   Baseline HR 81 88    1 Minute HR 96 115    2 Minute HR 98 107    3 Minute HR 89 112    4 Minute HR 76 88    5 Minute HR 68 106    6 Minute HR 87 106    2 Minute Post HR 79 75    Interval Heart Rate? Yes Yes      Interval Oxygen   Interval Oxygen? Yes Yes    Baseline Oxygen Saturation % 93 % 93 %    Baseline Liters of Oxygen 5 L  pulsed 5 L  pulsed    1 Minute Oxygen Saturation % 88 %  rest :58-1:10 SOB 85 %  84% 1:03-1:23    1 Minute Liters of Oxygen 5 L 6 L  continuous    2 Minute Oxygen Saturation % 81 %  at 1:22 dropped to 84% 89 %  rest 2:18-228, 84% 2:51-3:15    2 Minute Liters of Oxygen 5 L 6 L    3 Minute Oxygen Saturation % 83 %  began seated rest at 3 min 85 %    3 Minute Liters of Oxygen 5 L 8 L    4 Minute Oxygen Saturation % 86 % 92 %  82% 3:38-3:58    4 Minute Liters of Oxygen 5 L 8 L    5 Minute Oxygen Saturation % 88 %  recoved to 90% at 4:55 walking resumed 84 %  84% 5:00-5:30    5 Minute Liters of Oxygen 5 L 8 L    6 Minute Oxygen Saturation % 91 % 93 %    6 Minute Liters of Oxygen 5 L 8 L    2 Minute Post Oxygen Saturation % 91 % 92 %    2 Minute Post Liters of Oxygen 5 L 8 L       Initial Exercise Prescription:     Initial Exercise Prescription - 10/27/15 1400      Date of Initial Exercise RX and Referring Provider   Date 10/27/15   Referring Provider Simonne Maffucci MD     Oxygen   Oxygen Continuous   Liters 4  may need 6L on treadmill     Treadmill   MPH 0.5   Grade 0   Minutes 15   METs 1.4     NuStep   Level 1   Watts --  60-80 spm   Minutes 15  63mn x3   METs 1.5     Arm Ergometer   Level 1   Watts --  25-35 rpm    Minutes 15  5 min x3   METs 1.4     Prescription Details   Frequency (times per week) 3   Duration Progress to 45 minutes of aerobic exercise without signs/symptoms of physical distress     Intensity   THRR 40-80% of Max Heartrate 102-124   Ratings of Perceived Exertion 11-15   Perceived Dyspnea 0-4     Progression   Progression Continue to progress workloads to maintain intensity without signs/symptoms of physical distress.     Resistance  Training   Training Prescription Yes   Weight 2 lbs   Reps 10-12      Perform Capillary Blood Glucose checks as needed.  Exercise Prescription Changes:     Exercise Prescription Changes    Row Name 11/02/15 1500 11/11/15 1500 11/25/15 1500 12/08/15 1500 12/22/15 1500     Exercise Review   Progression _0      Response to Exercise   Blood Pressure (Admit) 120/70 110/62 138/68 152/80 142/70   Blood Pressure (Exercise) 138/82 138/70 128/82 150/76 136/78   Blood Pressure (Exit) 132/70 126/64 138/80 150/82 134/82   Heart Rate (Admit) 79 bpm 78 bpm 76 bpm 70 bpm 86 bpm   Heart Rate (Exercise) 100 bpm 78 bpm 108 bpm 102 bpm 125 bpm   Heart Rate (Exit) 89 bpm 75 bpm 107 bpm 92 bpm 90 bpm   Oxygen Saturation (Admit) 92 % 92 % 92 % 94 % 91 %   Oxygen Saturation (Exercise) 88 % 94 % 89 % 90 % 91 %   Oxygen Saturation (Exit) 98 % 99 % 90 % 98 % 97 %   Rating of Perceived Exertion (Exercise) _1 Perceived Dyspnea (Exercise) _2 Symptoms _3    Duration Progress to 45 minutes of aerobic exercise without signs/symptoms of physical distress Progress to 45 minutes of aerobic exercise without signs/symptoms of physical distress Progress to 45 minutes of aerobic exercise without signs/symptoms of physical distress Progress to 45 minutes of aerobic exercise without signs/symptoms of physical distress Progress to 45 minutes of aerobic exercise without signs/symptoms of physical distress   Intensity  _4      Progression   Progression Continue to progress workloads to maintain intensity without signs/symptoms of physical distress. Continue to progress workloads to maintain intensity without signs/symptoms of physical distress. Continue to progress workloads to maintain intensity without signs/symptoms of physical distress. Continue to progress workloads to maintain intensity without signs/symptoms of physical distress. Continue to progress workloads to maintain intensity without signs/symptoms of physical distress.   Average METs  -- 1.4 1.76 1.63 1.75     Resistance Training   Training Prescription _5    Weight 2 2 lbs 2 lbs 6 lbs 3 lbs   Reps 10-12 10-12 10-12 10-12 10-12     Interval Training   Interval Training _6      Oxygen   Oxygen _7    Liters  -- 4-6 4-6  6L on treadmill 4-6  6L on treadmill 4-6  6L on treadmill     Treadmill   MPH 0.8 0.8 1 0.8 0.8   Grade 0 0 0 0 0   Minutes 15  2/2/2/2/2 7  29mn, 5 min  _8 METs  -- 1.6 1.77 1.6 1.6     NuStep   Level _9 Watts  -- --  63 spm --  60 spm --  65 spm --  61 spm   Minutes _10 METs  -- 1.6 1.6 1.6 1.8     Arm Ergometer   Level _11 Watts  --  -- --  32 rpm --  45 rpm  --   Minutes 7.5 7._12 4  METs  -- 1 1.9 1.6 1.6   Row Name 01/05/16 1500             Exercise Review   Progression Yes         Response to Exercise   Blood Pressure (Admit) 106/62       Blood Pressure (Exercise) 130/80       Blood Pressure (Exit) 122/56       Heart Rate (Admit) 85 bpm       Heart Rate (Exercise) 98 bpm       Heart Rate (Exit) 90 bpm       Oxygen Saturation (Admit) 93 %       Oxygen Saturation (Exercise) 89 %       Oxygen Saturation (Exit) 96 %       Rating of Perceived Exertion (Exercise) 13       Perceived Dyspnea (Exercise) 3        Symptoms none       Comments Home Exercise Guidelines given 12/18/15       Duration Progress to 45 minutes of aerobic exercise without signs/symptoms of physical distress       Intensity THRR unchanged         Progression   Progression Continue to progress workloads to maintain intensity without signs/symptoms of physical distress.       Average METs 1.6         Resistance Training   Training Prescription Yes       Weight 3 lbs       Reps 10-12         Interval Training   Interval Training No         Oxygen   Oxygen Continuous       Liters 4-6  6L on treadmill         Treadmill   MPH 0.8       Grade 0       Minutes 15       METs 1.6         NuStep   Level 5       Watts --  88 spm       Minutes 15       METs 1.5         Home Exercise Plan   Plans to continue exercise at [employer] (comment)  [facility] Home (walk/treadmill)       Frequency Add 2 additional days to program exercise sessions.          Exercise Comments:     Exercise Comments    Row Name 10/27/15 1448 11/04/15 1356 11/11/15 1536 11/25/15 1540 12/08/15 1551   Exercise Comments Exercise goal is to be able to walk longer without getting SOB First full day of exercise was on Monday!  Patient was oriented to gym and equipment including functions, settings, policies, and procedures.  Patient's individual exercise prescription and treatment plan were reviewed.  All starting workloads were established based on the results of the 6 minute walk test done at initial orientation visit.  The plan for exercise progression was also introduced and progression will be customized based on patient's performance and goals. [name] is off to a good start with exercise.   He is doing 0.8 mph on treadmill for 2 min intervals for 8 min total!  We will continue to monitor his progression. [name] continues to do well with exercise.  He is starting to become more independent.  We will hold off on home exercise  just a little bit longer unitl he is ready.  We will continue to monitor his progression. [name] is getting more independent with exercise.  He may be ready for home exercise soon.  We will continue to monitor his progression.   [place] Name 12/22/15 1550 01/05/16 1533         Exercise Comments [name] is doing well with exercise.  We will continue to monitor for progression. [name] has been doing well in rehab.  He is up to 88 spm on the NuStep.  We will continue to monitor his progress.         Discharge Exercise Prescription (Final Exercise Prescription Changes):     Exercise Prescription Changes - 01/05/16 1500      Exercise Review   Progression Yes     Response to Exercise   Blood Pressure (Admit) 106/62   Blood Pressure (Exercise) 130/80   Blood Pressure (Exit) 122/56   Heart Rate (Admit) 85 bpm   Heart Rate (Exercise) 98 bpm   Heart Rate (Exit) 90 bpm   Oxygen Saturation (Admit) 93 %   Oxygen Saturation (Exercise) 89 %   Oxygen Saturation (Exit) 96 %   Rating of Perceived Exertion (Exercise) 13   Perceived Dyspnea (Exercise) 3   Symptoms none   Comments Home Exercise Guidelines given 12/18/15   Duration Progress to 45 minutes of aerobic exercise without signs/symptoms of physical distress   Intensity THRR unchanged     Progression   Progression Continue to progress workloads to maintain intensity without signs/symptoms of physical distress.   Average METs 1.6     Resistance Training   Training Prescription Yes   Weight 3 lbs   Reps 10-12     Interval Training   Interval Training No     Oxygen   Oxygen Continuous   Liters 4-6  6L on treadmill     Treadmill   MPH 0.8   Grade 0   Minutes 15   METs 1.6     NuStep   Level 5   Watts --  88 spm   Minutes 15   METs 1.5     Home Exercise Plan   Plans to continue exercise at [employer] (comment)  [facility], Home (walk/treadmill)   Frequency Add 2 additional days to program exercise  sessions.       Nutrition:  Target Goals: Understanding of nutrition guidelines, daily intake of sodium <1558m, cholesterol <2034m calories 30% from fat and 7% or less from saturated fats, daily to have 5 or more servings of fruits and vegetables.  Biometrics:     Pre Biometrics - 10/27/15 1448      Pre Biometrics   Height 5' 8.5" (1.74 m)   Weight 168 lb 6.4 oz (76.4 kg)   Waist Circumference 38.5 inches   Hip Circumference 40.5 inches   Waist to Hip Ratio 0.95 %   BMI (Calculated) 25.3       Nutrition Therapy Plan and Nutrition Goals:   Nutrition Discharge: Rate Your Plate Scores:   Psychosocial: Target Goals: Acknowledge presence or absence of depression, maximize coping skills, provide positive support system. Participant is able to verbalize types and ability to use techniques and skills needed for reducing stress and depression.  Initial Review & Psychosocial Screening:     Initial Psych Review & Screening - 10/27/15 [id number]  Comments [name] states he does have times he misses certain activities, such as golf, that he can no longer perform due to his shortness of breath. He has great support from his wife   .     Barriers   Psychosocial barriers to participate in program There are no identifiable barriers or psychosocial needs.;The patient should benefit from training in stress management and relaxation.     Screening Interventions   Interventions Encouraged to exercise;Program counselor consult      Quality of Life Scores:     Quality of Life - 10/27/15 1447      Quality of Life Scores   Health/Function Pre 20.69 %   Socioeconomic Pre 20.29 %   Psych/Spiritual Pre 21 %   Family Pre 21 %   GLOBAL Pre 20.71 %      PHQ-9: Recent Review Flowsheet Data    Depression screen [facility] 2/9 11/26/2015 10/27/2015 07/25/2014 05/22/2014 06/01/2012   Decreased Interest 0 0 0 0 0   Down, Depressed, Hopeless 0 0 0 0  0   PHQ - 2 Score 0 0 0 0 0   Altered sleeping - 0 - - -   Tired, decreased energy - 1 - - -   Change in appetite - 0 - - -   Feeling bad or failure about yourself  - 0 - - -   Trouble concentrating - 0 - - -   Moving slowly or fidgety/restless - 0 - - -   Suicidal thoughts - 0 - - -   PHQ-9 Score - 1 - - -   Difficult doing work/chores - Not difficult at all - - -      Psychosocial Evaluation and Intervention:     Psychosocial Evaluation - 11/09/15 1235      Psychosocial Evaluation & Interventions   Interventions Encouraged to exercise with the program and follow exercise prescription;Stress management education;Relaxation education   Comments Counselor met with Randall. Long (Randall. Lemmie Long) today for initial psychosocial evaluation.  He is an 83 year old gentleman who has been married 60 years and has a strong support system with adult children and active involvement in his local church community.  Randall. Lemmie Long states he sleeps well (with occasional Tylenol at bedtime) and he has a good appetite.  He denies a history or current symptoms of depression or anxiety and states he is typically in a positive mood.  He has minimal stress in his life although he "worries" some about the health of his wife as she "works too hard taking care of me."  Randall. Lemmie Long has goals to increase his stamin and strength and to breathe better while in this program.        Psychosocial Re-Evaluation:     Psychosocial Re-Evaluation    Row Name 12/08/15 1231 12/28/15 1623           Psychosocial Re-Evaluation   Comments See 30 note 12/08/15 [name] psychosocial assessment reveals no barriers at this time to participation in Pulmonary Rehab.  He  has good family and friend support that encourages [place] to participate in [place] and progress with His goals.  Brittan concerns are monitored, but he  has acknowledge that attending the program has helped to maintain quality life with improved mobility, self-care, and emotional and  financial stability.  Dennys is commended for regular attendance and self-motivation to improve His pulmonary disease management.        Education: Education Goals: Education classes will be provided on  a weekly basis, covering required topics. Participant will state understanding/return demonstration of topics presented.  Learning Barriers/Preferences:     Learning Barriers/Preferences - 10/27/15 1434      Learning Barriers/Preferences   Learning Barriers None   Learning Preferences None      Education Topics: Initial Evaluation Education: - Verbal, written and demonstration of respiratory meds, RPE/PD scales, oximetry and breathing techniques. Instruction on use of nebulizers and MDIs: cleaning and proper use, rinsing mouth with steroid doses and importance of monitoring MDI activations. Flowsheet Row Pulmonary Rehab from 12/23/2015 in [facility] Cardiac and Pulmonary Rehab  Date  10/27/15  Educator  LB  Instruction Review Code  2- meets goals/outcomes      General Nutrition Guidelines/Fats and Fiber: -Group instruction provided by verbal, written material, models and posters to present the general guidelines for heart healthy nutrition. Gives an explanation and review of dietary fats and fiber. Flowsheet Row Pulmonary Rehab from 12/23/2015 in [facility] Cardiac and Pulmonary Rehab  Date  12/07/15  Educator  CR  Instruction Review Code  2- meets goals/outcomes      Controlling Sodium/Reading Food Labels: -Group verbal and written material supporting the discussion of sodium use in heart healthy nutrition. Review and explanation with models, verbal and written materials for utilization of the food label. Flowsheet Row Pulmonary Rehab from 12/23/2015 in [facility] Cardiac and Pulmonary Rehab  Date  12/14/15  Educator  CR  Instruction Review Code  2- meets goals/outcomes      Exercise Physiology & Risk Factors: - Group verbal and written instruction with models to review the exercise  physiology of the cardiovascular system and associated critical values. Details cardiovascular disease risk factors and the goals associated with each risk factor. Flowsheet Row Pulmonary Rehab from 12/23/2015 in [facility] Cardiac and Pulmonary Rehab  Date  12/02/15  Educator  AS  Instruction Review Code  2- meets goals/outcomes      Aerobic Exercise & Resistance Training: - Gives group verbal and written discussion on the health impact of inactivity. On the components of aerobic and resistive training programs and the benefits of this training and how to safely progress through these programs.   Flexibility, Balance, General Exercise Guidelines: - Provides group verbal and written instruction on the benefits of flexibility and balance training programs. Provides general exercise guidelines with specific guidelines to those with heart or lung disease. Demonstration and skill practice provided.   Stress Management: - Provides group verbal and written instruction about the health risks of elevated stress, cause of high stress, and healthy ways to reduce stress.   Depression: - Provides group verbal and written instruction on the correlation between heart/lung disease and depressed mood, treatment options, and the stigmas associated with seeking treatment. Flowsheet Row Pulmonary Rehab from 12/23/2015 in [facility] Cardiac and Pulmonary Rehab  Date  12/21/15  Educator  [facility]  Instruction Review Code  2- meets goals/outcomes      Exercise & Equipment Safety: - Individual verbal instruction and demonstration of equipment use and safety with use of the equipment.   Infection Prevention: - Provides verbal and written material to individual with discussion of infection control including proper hand washing and proper equipment cleaning during exercise session. Flowsheet Row Pulmonary Rehab from 12/23/2015 in [facility] Cardiac and Pulmonary Rehab  Date  10/27/15  Educator  LB  Instruction Review Code  2-  meets goals/outcomes      Falls Prevention: - Provides verbal and written material to individual with discussion of falls prevention and safety.  Flowsheet Row Pulmonary Rehab from 12/23/2015 in [facility] Cardiac and Pulmonary Rehab  Date  10/27/15  Educator  LB  Instruction Review Code  2- meets goals/outcomes      Diabetes: - Individual verbal and written instruction to review signs/symptoms of diabetes, desired ranges of glucose level fasting, after meals and with exercise. Advice that pre and post exercise glucose checks will be done for 3 sessions at entry of program.   Chronic Lung Diseases: - Group verbal and written instruction to review new updates, new respiratory medications, new advancements in procedures and treatments. Provide informative websites and "800" numbers of self-education. Flowsheet Row Pulmonary Rehab from 12/23/2015 in [facility] Cardiac and Pulmonary Rehab  Date  12/16/15  Educator  LB  Instruction Review Code  2- meets goals/outcomes      Lung Procedures: - Group verbal and written instruction to describe testing methods done to diagnose lung disease. Review the outcome of test results. Describe the treatment choices: Pulmonary Function Tests, ABGs and oximetry.   Energy Conservation: - Provide group verbal and written instruction for methods to conserve energy, plan and organize activities. Instruct on pacing techniques, use of adaptive equipment and posture/positioning to relieve shortness of breath.   Triggers: - Group verbal and written instruction to review types of environmental controls: home humidity, furnaces, filters, dust mite/pet prevention, HEPA vacuums. To discuss weather changes, air quality and the benefits of nasal washing.   Exacerbations: - Group verbal and written instruction to provide: warning signs, infection symptoms, calling MD promptly, preventive modes, and value of vaccinations. Review: effective airway clearance, coughing and/or  vibration techniques. Create an Sports administrator. Flowsheet Row Pulmonary Rehab from 12/23/2015 in [facility] Cardiac and Pulmonary Rehab  Date  12/23/15  Educator  LB  Instruction Review Code  2- meets goals/outcomes      Oxygen: - Individual and group verbal and written instruction on oxygen therapy. Includes supplement oxygen, available portable oxygen systems, continuous and intermittent flow rates, oxygen safety, concentrators, and Medicare reimbursement for oxygen.   Respiratory Medications: - Group verbal and written instruction to review medications for lung disease. Drug class, frequency, complications, importance of spacers, rinsing mouth after steroid MDI's, and proper cleaning methods for nebulizers. Flowsheet Row Pulmonary Rehab from 12/23/2015 in [facility] Cardiac and Pulmonary Rehab  Date  10/27/15  Educator  LB  Instruction Review Code  2- meets goals/outcomes      AED/CPR: - Group verbal and written instruction with the use of models to demonstrate the basic use of the AED with the basic ABC's of resuscitation.   Breathing Retraining: - Provides individuals verbal and written instruction on purpose, frequency, and proper technique of diaphragmatic breathing and pursed-lipped breathing. Applies individual practice skills. Flowsheet Row Pulmonary Rehab from 12/23/2015 in [facility] Cardiac and Pulmonary Rehab  Date  10/27/15  Educator  LB  Instruction Review Code  2- meets goals/outcomes      Anatomy and Physiology of the Lungs: - Group verbal and written instruction with the use of models to provide basic lung anatomy and physiology related to function, structure and complications of lung disease.   Heart Failure: - Group verbal and written instruction on the basics of heart failure: signs/symptoms, treatments, explanation of ejection fraction, enlarged heart and cardiomyopathy.   Sleep Apnea: - Individual verbal and written instruction to review Obstructive Sleep Apnea. Review of  risk factors, methods for diagnosing and types of masks and machines for OSA.   Anxiety: - Provides group, verbal and written instruction on the  correlation between heart/lung disease and anxiety, treatment options, and management of anxiety.   Relaxation: - Provides group, verbal and written instruction about the benefits of relaxation for patients with heart/lung disease. Also provides patients with examples of relaxation techniques. Flowsheet Row Pulmonary Rehab from 12/23/2015 in [facility] Cardiac and Pulmonary Rehab  Date  11/18/15  Educator  [facility]  Instruction Review Code  2- Meets goals/outcomes      Knowledge Questionnaire Score:     Knowledge Questionnaire Score - 10/27/15 1434      Knowledge Questionnaire Score   Pre Score 4/10       Core Components/Risk Factors/Patient Goals at Admission:     Personal Goals and Risk Factors at Admission - 10/27/15 1440      Core Components/Risk Factors/Patient Goals on Admission   Sedentary Yes  Walks driveway at home 5 days/week for 26mns   Intervention Provide advice, education, support and counseling about physical activity/exercise needs.;Develop an individualized exercise prescription for aerobic and resistive training based on initial evaluation findings, risk stratification, comorbidities and participant's personal goals.   Expected Outcomes Achievement of increased cardiorespiratory fitness and enhanced flexibility, muscular endurance and strength shown through measurements of functional capacity and personal statement of participant.   Increase Strength and Stamina Yes   Intervention Provide advice, education, support and counseling about physical activity/exercise needs.;Develop an individualized exercise prescription for aerobic and resistive training based on initial evaluation findings, risk stratification, comorbidities and participant's personal goals.   Expected Outcomes Achievement of increased cardiorespiratory fitness and  enhanced flexibility, muscular endurance and strength shown through measurements of functional capacity and personal statement of participant.   Improve shortness of breath with ADL's Yes   Intervention Provide education, individualized exercise plan and daily activity instruction to help decrease symptoms of SOB with activities of daily living.   Expected Outcomes Short Term: Achieves a reduction of symptoms when performing activities of daily living.   Develop more efficient breathing techniques such as purse lipped breathing and diaphragmatic breathing; and practicing self-pacing with activity Yes  Uses PLB; [facility]   Intervention Provide education, demonstration and support about specific breathing techniuqes utilized for more efficient breathing. Include techniques such as pursed lipped breathing, diaphragmatic breathing and self-pacing activity.   Expected Outcomes Short Term: Participant will be able to demonstrate and use breathing techniques as needed throughout daily activities.   Increase knowledge of respiratory medications and ability to use respiratory devices properly  Yes  Albuterol MDI; Spacer given; oxygen 2-5l/m   Intervention Provide education and demonstration as needed of appropriate use of medications, inhalers, and oxygen therapy.   Expected Outcomes Short Term: Achieves understanding of medications use. Understands that oxygen is a medication prescribed by physician. Demonstrates appropriate use of inhaler and oxygen therapy.   Lipids Yes   Intervention Provide education and support for participant on nutrition & aerobic/resistive exercise along with prescribed medications to achieve LDL <765m HDL >4071m  Expected Outcomes Short Term: Participant states understanding of desired cholesterol values and is compliant with medications prescribed. Participant is following exercise prescription and nutrition guidelines.;Long Term: Cholesterol controlled  with medications as prescribed, with individualized exercise RX and with personalized nutrition plan. Value goals: LDL < 85m74mDL > 40 mg.      Core Components/Risk Factors/Patient Goals Review:      Goals and Risk Factor Review    Row Name 11/23/15 1439 12/08/15 0640 12/28/15 1614         Core Components/Risk  Factors/Patient Goals Review   Personal Goals Review Sedentary;Increase Strength and Stamina;Improve shortness of breath with ADL's;Develop more efficient breathing techniques such as purse lipped breathing and diaphragmatic breathing and practicing self-pacing with activity.;Increase knowledge of respiratory medications and ability to use respiratory devices properly. Sedentary;Increase Strength and Stamina;Improve shortness of breath with ADL's;Increase knowledge of respiratory medications and ability to use respiratory devices properly. Sedentary;Increase Strength and Stamina;Improve shortness of breath with ADL's;Increase knowledge of respiratory medications and ability to use respiratory devices properly.;Develop more efficient breathing techniques such as purse lipped breathing and diaphragmatic breathing and practicing self-pacing with activity.;Lipids     Review [name] has completed 3 weeks in [facility] and states an improvement with his strength. He has recently started on Mucinex which has improved his sputum production. He has a good understanding of his Albuterol MDI and carries it with him. The spacer he uses at home. His PD's for shortness of breath are 3-4 and does not notice improvement in his breathing, although he does use PLB and adjusts his oxygen to 5l/m at home with activity. [name] walks at home his driveway with pacing and use of his home oximeter. Unfortunately he uses intermittent flow with the oxygen, so  his O2Sat's do drop in the 80's - encouraged him to rest and pace himself to keep O2Sat's 88% or greater. [name] is still walking his driveway on  his days off from [place], although with the cold weather, he has been walking at [facility]. He recently purchased new sneakers for better support for exercise. His breathing has worsened over the last couple of weeks. He physician has ordered an EKG.  [name] has had a productive cough, but the sputum has been clear. He does use a flutter valve.  [name] has progressed his goals on the NS, TM, and AE. He adjusts his oxygen from 4-6l/m with his exercise.  Recently he bought a new pair of sneakers for better support during his exercise. He is using his Albuterol MDI with the aerochamber. His physician has ordered a vibrating vest for mobilization of his secretions. [name] is compliant with his lipid medication.     Expected Outcomes  -- Continue exercising to increase his strength and help improve shortness of breath. Contact oxygen company for a larger portable tank. Continue to manage his bronchiectasis with the vibrating vest and oxygen.        Core Components/Risk Factors/Patient Goals at Discharge (Final Review):      Goals and Risk Factor Review - 12/28/15 1614      Core Components/Risk Factors/Patient Goals Review   Personal Goals Review Sedentary;Increase Strength and Stamina;Improve shortness of breath with ADL's;Increase knowledge of respiratory medications and ability to use respiratory devices properly.;Develop more efficient breathing techniques such as purse lipped breathing and diaphragmatic breathing and practicing self-pacing with activity.;Lipids   Review [name] has progressed his goals on the NS, TM, and AE. He adjusts his oxygen from 4-6l/m with his exercise.  Recently he bought a new pair of sneakers for better support during his exercise. He is using his Albuterol MDI with the aerochamber. His physician has ordered a vibrating vest for mobilization of his secretions. [name] is compliant with his lipid medication.   Expected Outcomes Continue to manage  his bronchiectasis with the vibrating vest and oxygen.      ITP Comments:     ITP Comments    Row Name 01/04/16 1609 01/05/16 1532 01/20/16 1450  ITP Comments Ms Pisarski called today. [name] is being tested for a possible stroke on 01/03/16. He will return as soon as possible to [facility]. 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  He was discharged today 01/05/16 with HHPT and possible nuero rehab after that.  We will follow with them next week to determine plan of action for pulmonary rehab. Called [name] to check how he was doing after his strike. He states he is having home therapy and has improved to "almost normal". The chest vest therapy has been helpful which he performs twice daily. He will inform us about his progress and  return to [place].        Comments: 30 day note review

## 2016-02-02 DIAGNOSIS — E785 Hyperlipidemia, unspecified: Secondary | ICD-10-CM | POA: Diagnosis not present

## 2016-02-02 DIAGNOSIS — I2510 Atherosclerotic heart disease of native coronary artery without angina pectoris: Secondary | ICD-10-CM | POA: Diagnosis not present

## 2016-02-02 DIAGNOSIS — J449 Chronic obstructive pulmonary disease, unspecified: Secondary | ICD-10-CM | POA: Diagnosis not present

## 2016-02-02 DIAGNOSIS — I480 Paroxysmal atrial fibrillation: Secondary | ICD-10-CM | POA: Diagnosis not present

## 2016-02-02 DIAGNOSIS — Z7901 Long term (current) use of anticoagulants: Secondary | ICD-10-CM | POA: Diagnosis not present

## 2016-02-02 DIAGNOSIS — Z7951 Long term (current) use of inhaled steroids: Secondary | ICD-10-CM | POA: Diagnosis not present

## 2016-02-02 DIAGNOSIS — I69398 Other sequelae of cerebral infarction: Secondary | ICD-10-CM | POA: Diagnosis not present

## 2016-02-02 DIAGNOSIS — I739 Peripheral vascular disease, unspecified: Secondary | ICD-10-CM | POA: Diagnosis not present

## 2016-02-02 DIAGNOSIS — M6281 Muscle weakness (generalized): Secondary | ICD-10-CM | POA: Diagnosis not present

## 2016-02-03 DIAGNOSIS — J449 Chronic obstructive pulmonary disease, unspecified: Secondary | ICD-10-CM | POA: Diagnosis not present

## 2016-02-04 DIAGNOSIS — Z7951 Long term (current) use of inhaled steroids: Secondary | ICD-10-CM | POA: Diagnosis not present

## 2016-02-04 DIAGNOSIS — I739 Peripheral vascular disease, unspecified: Secondary | ICD-10-CM | POA: Diagnosis not present

## 2016-02-04 DIAGNOSIS — J449 Chronic obstructive pulmonary disease, unspecified: Secondary | ICD-10-CM | POA: Diagnosis not present

## 2016-02-04 DIAGNOSIS — E785 Hyperlipidemia, unspecified: Secondary | ICD-10-CM | POA: Diagnosis not present

## 2016-02-04 DIAGNOSIS — Z7901 Long term (current) use of anticoagulants: Secondary | ICD-10-CM | POA: Diagnosis not present

## 2016-02-04 DIAGNOSIS — I2510 Atherosclerotic heart disease of native coronary artery without angina pectoris: Secondary | ICD-10-CM | POA: Diagnosis not present

## 2016-02-04 DIAGNOSIS — M6281 Muscle weakness (generalized): Secondary | ICD-10-CM | POA: Diagnosis not present

## 2016-02-04 DIAGNOSIS — I69398 Other sequelae of cerebral infarction: Secondary | ICD-10-CM | POA: Diagnosis not present

## 2016-02-04 DIAGNOSIS — I480 Paroxysmal atrial fibrillation: Secondary | ICD-10-CM | POA: Diagnosis not present

## 2016-02-11 ENCOUNTER — Ambulatory Visit (INDEPENDENT_AMBULATORY_CARE_PROVIDER_SITE_OTHER): Payer: Medicare Other | Admitting: Pulmonary Disease

## 2016-02-11 ENCOUNTER — Encounter: Payer: Self-pay | Admitting: Pulmonary Disease

## 2016-02-11 DIAGNOSIS — I272 Pulmonary hypertension, unspecified: Secondary | ICD-10-CM

## 2016-02-11 DIAGNOSIS — J479 Bronchiectasis, uncomplicated: Secondary | ICD-10-CM

## 2016-02-11 DIAGNOSIS — J9611 Chronic respiratory failure with hypoxia: Secondary | ICD-10-CM | POA: Diagnosis not present

## 2016-02-11 NOTE — Patient Instructions (Signed)
Continue taking your medications as you are doing Continue using your therapy vest twice a day Let us know if your symptoms (shortness of breath) worsen over the next few months We will see you back in 3 months or sooner if needed

## 2016-02-11 NOTE — Progress Notes (Signed)
Subjective:    Patient ID: [name], male    DOB: 29-Mar-1928, 83 y.o.   MRN: [id number]  Synopsis: former smoker with moderate airflow obstruction due to COPD And bronchiectasis.Marland Kitchen  Spirometry in 12/2011 showed clear airflow obstruction and an FEV1 of 2.12 L (67% pred). August 2017 CT chest images personally reviewed showing bronchiectatic changes in addition to findings consistent with COPD (emphysema)   HPI Chief Complaint  Patient presents with  . Follow-up    6wk rov-pt states breathing has slightly improved since last OV.  pt c/o cont sob with exertion & prod cough with white mucus..   Mr. Lekas has not been doing well.  He had a minor stroke a few weeks back after one of his pulmonary rehab session.  He spent the night at [facility] for an acute embolic stroke.  He had his Xarelto dose adjusted to 78m daily.  He did not have any COPD problems while he was there.    Since coming home from the hospital he feels that his breathing has been OK.  He says that he "runs out of gas" too quickly and he has noticed that his oxygen is staying above 90% while using 4L O2 with exertion.    We prescribed a therapy vest for him but it took several weeks to get it.  He has been using it twice per day for about 30 minutes at a time.  He will cough up mucus. Typically it is clear mucus.    Past Medical History:  Diagnosis Date  . AF ([place]rial fibrillation) ([place]    after PTX 2012  . Allergy   . Arthritis    h/o R shoulder injection per ortho  . Back pain   . BPH (benign prostatic hyperplasia)   . COPD (chronic obstructive pulmonary disease) ([place]   . Coronary artery disease   . Elevated MCV   . Erectile dysfunction   . GERD (gastroesophageal reflux disease)   . HSV-1 (herpes simplex virus 1) infection   . Hyperlipidemia   . Spontaneous pneumothorax 09/04/2010   [name]     Review of Systems  Constitutional: Positive for fatigue. Negative for chills and fever.  HENT:  Negative for postnasal drip, rhinorrhea and sinus pressure.   Respiratory: Positive for shortness of breath. Negative for cough and wheezing.   Cardiovascular: Negative for chest pain, palpitations and leg swelling.       Objective:   Physical Exam Vitals:   02/11/16 1439  BP: 124/60  BP Location: Left Arm  Cuff Size: Normal  Pulse: 66  SpO2: 91%  Weight: 163 lb 9.6 oz (74.2 kg)  Height: _0  (1.778 m)   2L [place]  Gen: well appearing HENT: OP clear, , neck supple PULM: Crackles bases bilaterally B, normal percussion CV: Irreg irreg, no mgr, trace edema GI: BS+, soft, nontender Derm: no cyanosis or rash Psyche: normal mood and affect   Records from his hospitalization from 12/2015 reviewed where he was treated for an acute embolic stroke and had his Xarelto adjusted  BMET    Component Value Date/Time   NA 141 01/14/2016 1620   K 4.2 01/14/2016 1620   K 4.1 05/29/2015   CL 103 01/14/2016 1620   CO2 29 01/14/2016 1620   CO2 29 05/29/2015   GLUCOSE 115 (H) 01/14/2016 1620   BUN 20 01/14/2016 1620   CREATININE 1.15 01/14/2016 1620   CREATININE 1.300 05/29/2015   CALCIUM 9.3 01/14/2016 1620   GFRNONAA >60  01/04/2016 0724   GFRAA >60 01/04/2016 0724       Assessment & Plan:   COPD This has been a stable interval for Norah wihtout an exerbation of his COPD. in 2017 we discovered that he had concomitant bronchiectasis which explain the decline in his shortness of breath and oxygenation.  Fortunately he has not had an exacerbation since the last visit.  Plan: Continue Spiriva Immunizations are up-to-date  Chronic hypoxemic respiratory failure (HCC) Continue using 2 L of oxygen at rest four with exertion.  Bronchiectasis without acute exacerbation (HCC) Continue using therapy vest twice a day.  Pulmonary hypertension His echocardiogram in 2017 showed evidence of mild pulmonary hypertension which is no doubt due to his chronic respiratory failure with hypoxemia  from his advanced lung disease. In this case there is no clear evidence that would suggest benefit from a pulmonary vasodilator. However, if his symptoms continue to decline (worsening shortness of breath) then we may pursue repeat workup with cardiology.   Updated Medication List Outpatient Encounter Prescriptions as of 02/11/2016  Medication Sig  . acetaminophen (TYLENOL) 325 MG tablet Take 650 mg by mouth every 6 (six) hours as needed for mild pain.   Marland Kitchen albuterol (PROAIR HFA) 108 (90 Base) MCG/ACT inhaler Inhale 2 puffs into the lungs every 4 (four) hours as needed for wheezing or shortness of breath.  . Cyanocobalamin (VITAMIN B 12 PO) Take by mouth daily.  Marland Kitchen FIBER PO Take 1 capsule by mouth 2 (two) times daily.  . fluticasone (FLONASE) 50 MCG/ACT nasal spray Place 2 sprays into both nostrils daily. (Patient taking differently: Place 2 sprays into both nostrils daily as needed for allergies. )  . metoprolol tartrate (LOPRESSOR) 25 MG tablet Take 0.5 tablets (12.5 mg total) by mouth as needed (for palpitations).  . Multiple Vitamin (MULTIVITAMIN) tablet Take 1 tablet by mouth daily. Reported on 04/22/2015  . multivitamin-lutein (OCUVITE-LUTEIN) CAPS capsule Take 1 capsule by mouth daily.  . NON FORMULARY Oxygen 2-5 liters 24/7  . omeprazole (PRILOSEC) 20 MG capsule Take 20 mg by mouth daily.    . pravastatin (PRAVACHOL) 40 MG tablet Take 0.5 tablets (20 mg total) by mouth 2 (two) times a week.  Marland Kitchen Respiratory Therapy Supplies (FLUTTER) DEVI Use as directed  . rivaroxaban (XARELTO) 20 MG TABS tablet Take 1 tablet (20 mg total) by mouth daily with supper.  . tamsulosin (FLOMAX) 0.4 MG CAPS capsule Take 2 capsules (0.8 mg total) by mouth daily.  Marland Kitchen tiotropium (SPIRIVA) 18 MCG inhalation capsule Place 1 capsule (18 mcg total) into inhaler and inhale daily.  . traMADol (ULTRAM) 50 MG tablet TAKE 1 TABLET BY MOUTH 12 AS NEEDED FOR PAIN  . traZODone (DESYREL) 100 MG tablet TAKE 2 TABLETS (200 MG TOTAL)  BY MOUTH AT BEDTIME.   No facility-administered encounter medications on file as of 02/11/2016.

## 2016-02-11 NOTE — Assessment & Plan Note (Signed)
This has been a stable interval for [name] wihtout an exerbation of his COPD. in 2017 we discovered that he had concomitant bronchiectasis which explain the decline in his shortness of breath and oxygenation.  Fortunately he has not had an exacerbation since the last visit.  Plan: Continue Spiriva Immunizations are up-to-date

## 2016-02-11 NOTE — Assessment & Plan Note (Signed)
Continue using 2 L of oxygen at rest four with exertion.

## 2016-02-11 NOTE — Assessment & Plan Note (Signed)
Continue using therapy vest twice a day.

## 2016-02-11 NOTE — Assessment & Plan Note (Signed)
His echocardiogram in 2017 showed evidence of mild pulmonary hypertension which is no doubt due to his chronic respiratory failure with hypoxemia from his advanced lung disease. In this case there is no clear evidence that would suggest benefit from a pulmonary vasodilator. However, if his symptoms continue to decline (worsening shortness of breath) then we may pursue repeat workup with cardiology.

## 2016-02-12 ENCOUNTER — Other Ambulatory Visit: Payer: Self-pay | Admitting: *Deleted

## 2016-02-12 MED ORDER — RIVAROXABAN 20 MG PO TABS: 20.0000 mg | ORAL_TABLET | Freq: Every day | ORAL | 0 refills | Status: DC

## 2016-02-15 DIAGNOSIS — G629 Polyneuropathy, unspecified: Secondary | ICD-10-CM | POA: Diagnosis not present

## 2016-02-16 ENCOUNTER — Other Ambulatory Visit: Payer: Self-pay

## 2016-02-16 NOTE — Telephone Encounter (Signed)
Mrs [facility] request a rx for xarelto 20 mg and progress notes of last visit seen (seen 01/14/16) faxed to [facility] at [facility]; fax # [phone]. Mrs [facility] request cb when done. Xarelto # 90 on 02/12/16 to [facility]. Mrs Lyster said it takes a while to get med from [place].

## 2016-02-17 MED ORDER — RIVAROXABAN 20 MG PO TABS: 20.0000 mg | ORAL_TABLET | Freq: Every day | ORAL | 1 refills | Status: AC

## 2016-02-17 NOTE — Telephone Encounter (Signed)
rx printed, please send that and OV note after I sign the rx.  Thanks.

## 2016-02-17 NOTE — Telephone Encounter (Signed)
Faxed.

## 2016-02-17 NOTE — Telephone Encounter (Signed)
Left detailed message.   

## 2016-02-23 ENCOUNTER — Telehealth: Payer: Self-pay | Admitting: Respiratory Therapy

## 2016-02-23 ENCOUNTER — Encounter: Payer: Self-pay | Admitting: Respiratory Therapy

## 2016-02-23 DIAGNOSIS — J479 Bronchiectasis, uncomplicated: Secondary | ICD-10-CM

## 2016-02-23 DIAGNOSIS — J449 Chronic obstructive pulmonary disease, unspecified: Secondary | ICD-10-CM

## 2016-02-23 DIAGNOSIS — J9611 Chronic respiratory failure with hypoxia: Secondary | ICD-10-CM

## 2016-02-23 NOTE — Telephone Encounter (Signed)
Called [name] - last attended 12/31/16 due to a stroke. He had his last session of PT last week. He has a procedure coming up,  and the weather has kept him in the house. His wife will call back with more information on his return to [place].

## 2016-02-24 ENCOUNTER — Telehealth: Payer: Self-pay | Admitting: Pulmonary Disease

## 2016-02-24 DIAGNOSIS — J42 Unspecified chronic bronchitis: Secondary | ICD-10-CM

## 2016-02-24 DIAGNOSIS — I272 Pulmonary hypertension, unspecified: Secondary | ICD-10-CM

## 2016-02-24 DIAGNOSIS — J438 Other emphysema: Secondary | ICD-10-CM

## 2016-02-24 NOTE — Telephone Encounter (Signed)
I agree with getting him into pulmonary rehab.  I'm confused about the oxygen though, do they think he needs more or less?  Should be titrated based on his O2 saturation at rest and exertion to maintain his O2 sat > 88%

## 2016-02-24 NOTE — Telephone Encounter (Signed)
Called and spoke with pts wife and she stated that they received a RN visit and that nurse felt that the pts oxygen is set too high for him.  She advised the pts wife to call our office to find out from BQ of what he feels that the pt needs to be on.  Pt is at 3 liters at rest and 4 liters or higher when he is walking.  She stated that the pt gets anxious at times and feels that his oxygen needs to be at a higher setting.    They are also wanting the pt to get back into the rehab program at [facility].  If this is ok they will need the rx sent in for this.  BQ please advise. Thanks  Allergies  Allergen Reactions  . Rosuvastatin     REACTION: myalgias at 43m/day dose

## 2016-02-25 NOTE — Telephone Encounter (Signed)
Spoke with pt's wife, Pamala Hurry. She is aware of BQ's recommendations.  BQ - what diagnosis do you want to use for Pulmonary Rehab? Thanks.

## 2016-02-25 NOTE — Telephone Encounter (Signed)
COPD Pulmonary hypertension Emphysema

## 2016-02-26 NOTE — Telephone Encounter (Signed)
Order has been placed for the Pulmonary rehab to be set up.

## 2016-02-29 ENCOUNTER — Encounter: Payer: Self-pay | Admitting: Respiratory Therapy

## 2016-02-29 DIAGNOSIS — J479 Bronchiectasis, uncomplicated: Secondary | ICD-10-CM

## 2016-02-29 DIAGNOSIS — J449 Chronic obstructive pulmonary disease, unspecified: Secondary | ICD-10-CM

## 2016-02-29 NOTE — Progress Notes (Signed)
Pulmonary Individual Treatment Plan  Patient Details  Name: [name] MRN: [id number] Date of Birth: [birth year] Referring Provider:   Flowsheet Row Pulmonary Rehab from 10/27/2015 in [facility]  Referring Provider  Simonne Maffucci MD      Initial Encounter Date:  Flowsheet Row Pulmonary Rehab from 10/27/2015 in [facility] Cardiac and Pulmonary Rehab  Date  10/27/15  Referring Provider  Simonne Maffucci MD      Visit Diagnosis: COPD, mild ([place])  Bronchiectasis without complication ([place])  Patient's Home Medications on Admission:  Current Outpatient Prescriptions:    acetaminophen (TYLENOL) 325 MG tablet, Take 650 mg by mouth every 6 (six) hours as needed for mild pain. , Disp: , Rfl:    albuterol (PROAIR HFA) 108 (90 Base) MCG/ACT inhaler, Inhale 2 puffs into the lungs every 4 (four) hours as needed for wheezing or shortness of breath., Disp: 1 Inhaler, Rfl: 2   Cyanocobalamin (VITAMIN B 12 PO), Take by mouth daily., Disp: , Rfl:    FIBER PO, Take 1 capsule by mouth 2 (two) times daily., Disp: , Rfl:    fluticasone (FLONASE) 50 MCG/ACT nasal spray, Place 2 sprays into both nostrils daily. (Patient taking differently: Place 2 sprays into both nostrils daily as needed for allergies. ), Disp: , Rfl:    metoprolol tartrate (LOPRESSOR) 25 MG tablet, Take 0.5 tablets (12.5 mg total) by mouth as needed (for palpitations)., Disp: , Rfl:    Multiple Vitamin (MULTIVITAMIN) tablet, Take 1 tablet by mouth daily. Reported on 04/22/2015, Disp: , Rfl:    multivitamin-lutein (OCUVITE-LUTEIN) CAPS capsule, Take 1 capsule by mouth daily., Disp: , Rfl:    NON FORMULARY, Oxygen 2-5 liters 24/7, Disp: , Rfl:    omeprazole (PRILOSEC) 20 MG capsule, Take 20 mg by mouth daily.  , Disp: , Rfl:    pravastatin (PRAVACHOL) 40 MG tablet, Take 0.5 tablets (20 mg total) by mouth 2 (two) times a week., Disp: 30 tablet, Rfl: 0   Respiratory Therapy Supplies (FLUTTER) DEVI, Use as  directed, Disp: 1 each, Rfl: 0   rivaroxaban (XARELTO) 20 MG TABS tablet, Take 1 tablet (20 mg total) by mouth daily with supper., Disp: 90 tablet, Rfl: 1   tamsulosin (FLOMAX) 0.4 MG CAPS capsule, Take 2 capsules (0.8 mg total) by mouth daily., Disp: 180 capsule, Rfl: 3   tiotropium (SPIRIVA) 18 MCG inhalation capsule, Place 1 capsule (18 mcg total) into inhaler and inhale daily., Disp: 30 capsule, Rfl: 6   traMADol (ULTRAM) 50 MG tablet, TAKE 1 TABLET BY MOUTH 12 AS NEEDED FOR PAIN, Disp: 60 tablet, Rfl: 2   traZODone (DESYREL) 100 MG tablet, TAKE 2 TABLETS (200 MG TOTAL) BY MOUTH AT BEDTIME., Disp: 180 tablet, Rfl: 3  Past Medical History: Past Medical History:  Diagnosis Date   AF (atrial fibrillation) ([place])    after PTX 2012   Allergy    Arthritis    h/o R shoulder injection per ortho   Back pain    BPH (benign prostatic hyperplasia)    COPD (chronic obstructive pulmonary disease) (HCC)    Coronary artery disease    Elevated MCV    Erectile dysfunction    GERD (gastroesophageal reflux disease)    HSV-1 (herpes simplex virus 1) infection    Hyperlipidemia    Spontaneous pneumothorax 09/04/2010   [name]    Tobacco Use: History  Smoking Status   Former Smoker   Packs/day: 1.00   Years: 50.00   Types: Cigarettes   Quit  date: 04/14/1999  Smokeless Tobacco   Never Used    Labs: Recent Review Flowsheet Data    Labs for ITP Cardiac and Pulmonary Rehab Latest Ref Rng & Units 05/19/2014 09/15/2014 11/26/2015 01/03/2016 01/04/2016   Cholestrol 0 - 200 mg/dL 188 - 199 - 192   LDLCALC 0 - 99 mg/dL 132(H) - 124(H) - 138(H)   LDLDIRECT mg/dL - - - - -   HDL >40 mg/dL 31.50(L) - 40.60 - 36(L)   Trlycerides <150 mg/dL 124.0 - 172.0(H) - 90   Hemoglobin A1c 4.8 - 5.6 % - 5.7 5.4 - 5.4   PHART 7.350 - 7.450 - - - - -   PCO2ART 35.0 - 45.0 mmHg - - - - -   HCO3 20.0 - 24.0 mEq/L - - - - -   TCO2 0 - 100 mmol/L - - - 26 -   ACIDBASEDEF 0.0 - 2.0 mmol/L -  - - - -   O2SAT % - - - - -       ADL UCSD:     Pulmonary Assessment Scores    Row Name 10/27/15 1435 12/23/15 1317 12/30/15 0753     ADL UCSD   ADL Phase Entry Mid Mid   SOB Score total 77 91  --   Rest 0 1  --   Walk 3 3  --   Stairs 4 5  --   Bath 5 4  --   Dress 3 4  --   Shop 3 4  --      Pulmonary Function Assessment:     Pulmonary Function Assessment - 10/27/15 1434      Initial Spirometry Results   FVC% 102 %   FEV1% 79 %   FEV1/FVC Ratio 54   Comments Test done 09/11/15     Post Bronchodilator Spirometry Results   FVC% 103 %   FEV1% 86 %   FEV1/FVC Ratio 58     Breath   Bilateral Breath Sounds Clear;Decreased   Shortness of Breath Yes;Limiting activity;Fear of Shortness of Breath      Exercise Target Goals:    Exercise Program Goal: Individual exercise prescription set with THRR, safety & activity barriers. Participant demonstrates ability to understand and report RPE using BORG scale, to self-measure pulse accurately, and to acknowledge the importance of the exercise prescription.  Exercise Prescription Goal: Starting with aerobic activity 30 plus minutes a day, 3 days per week for initial exercise prescription. Provide home exercise prescription and guidelines that participant acknowledges understanding prior to discharge.  Activity Barriers & Risk Stratification:     Activity Barriers & Cardiac Risk Stratification - 10/27/15 1433      Activity Barriers & Cardiac Risk Stratification   Activity Barriers Shortness of Breath;Deconditioning;Joint Problems   Cardiac Risk Stratification Moderate      6 Minute Walk:     6 Minute Walk    Row Name 10/27/15 1438 12/23/15 1318       6 Minute Walk   Phase Initial Mid Program    Distance 220 feet 460 feet    Walk Time 2.25 minutes 4.27 minutes    # of Rest Breaks 2  12 sec, 3:33 5  20 sec, 10 sec, 24 sec, 20 sec, 30 sec    MPH 1.11 1.22    METS 1.84 1.9    RPE 13 13    Perceived Dyspnea   3 4    VO2 Peak 1.33 3.36    Symptoms No Yes (  comment)    Comments  -- SOB    Resting HR 81 bpm 88 bpm    Resting BP 134/64 126/70    Max Ex. HR 89 bpm 113 bpm    Max Ex. BP 136/74 134/66    2 Minute Post BP 136/64 126/64      Interval HR   Baseline HR 81 88    1 Minute HR 96 115    2 Minute HR 98 107    3 Minute HR 89 112    4 Minute HR 76 88    5 Minute HR 68 106    6 Minute HR 87 106    2 Minute Post HR 79 75    Interval Heart Rate? Yes Yes      Interval Oxygen   Interval Oxygen? Yes Yes    Baseline Oxygen Saturation % 93 % 93 %    Baseline Liters of Oxygen 5 L  pulsed 5 L  pulsed    1 Minute Oxygen Saturation % 88 %  rest :58-1:10 SOB 85 %  84% 1:03-1:23    1 Minute Liters of Oxygen 5 L 6 L  continuous    2 Minute Oxygen Saturation % 81 %  at 1:22 dropped to 84% 89 %  rest 2:18-228, 84% 2:51-3:15    2 Minute Liters of Oxygen 5 L 6 L    3 Minute Oxygen Saturation % 83 %  began seated rest at 3 min 85 %    3 Minute Liters of Oxygen 5 L 8 L    4 Minute Oxygen Saturation % 86 % 92 %  82% 3:38-3:58    4 Minute Liters of Oxygen 5 L 8 L    5 Minute Oxygen Saturation % 88 %  recoved to 90% at 4:55 walking resumed 84 %  84% 5:00-5:30    5 Minute Liters of Oxygen 5 L 8 L    6 Minute Oxygen Saturation % 91 % 93 %    6 Minute Liters of Oxygen 5 L 8 L    2 Minute Post Oxygen Saturation % 91 % 92 %    2 Minute Post Liters of Oxygen 5 L 8 L       Initial Exercise Prescription:     Initial Exercise Prescription - 10/27/15 1400      Date of Initial Exercise RX and Referring Provider   Date 10/27/15   Referring Provider Simonne Maffucci MD     Oxygen   Oxygen Continuous   Liters 4  may need 6L on treadmill     Treadmill   MPH 0.5   Grade 0   Minutes 15   METs 1.4     NuStep   Level 1   Watts --  60-80 spm   Minutes 15  110mn x3   METs 1.5     Arm Ergometer   Level 1   Watts --  25-35 rpm   Minutes 15  5 min x3   METs 1.4     Prescription  Details   Frequency (times per week) 3   Duration Progress to 45 minutes of aerobic exercise without signs/symptoms of physical distress     Intensity   THRR 40-80% of Max Heartrate 102-124   Ratings of Perceived Exertion 11-15   Perceived Dyspnea 0-4     Progression   Progression Continue to progress workloads to maintain intensity without signs/symptoms of physical distress.     Resistance Training  Training Prescription Yes   Weight 2 lbs   Reps 10-12      Perform Capillary Blood Glucose checks as needed.  Exercise Prescription Changes:     Exercise Prescription Changes    Row Name 11/02/15 1500 11/11/15 1500 11/25/15 1500 12/08/15 1500 12/22/15 1500     Exercise Review   Progression _0      Response to Exercise   Blood Pressure (Admit) 120/70 110/62 138/68 152/80 142/70   Blood Pressure (Exercise) 138/82 138/70 128/82 150/76 136/78   Blood Pressure (Exit) 132/70 126/64 138/80 150/82 134/82   Heart Rate (Admit) 79 bpm 78 bpm 76 bpm 70 bpm 86 bpm   Heart Rate (Exercise) 100 bpm 78 bpm 108 bpm 102 bpm 125 bpm   Heart Rate (Exit) 89 bpm 75 bpm 107 bpm 92 bpm 90 bpm   Oxygen Saturation (Admit) 92 % 92 % 92 % 94 % 91 %   Oxygen Saturation (Exercise) 88 % 94 % 89 % 90 % 91 %   Oxygen Saturation (Exit) 98 % 99 % 90 % 98 % 97 %   Rating of Perceived Exertion (Exercise) _1 Perceived Dyspnea (Exercise) _2 Symptoms _3    Duration Progress to 45 minutes of aerobic exercise without signs/symptoms of physical distress Progress to 45 minutes of aerobic exercise without signs/symptoms of physical distress Progress to 45 minutes of aerobic exercise without signs/symptoms of physical distress Progress to 45 minutes of aerobic exercise without signs/symptoms of physical distress Progress to 45 minutes of aerobic exercise without signs/symptoms of physical distress   Intensity THRR unchanged THRR unchanged THRR unchanged THRR  unchanged THRR unchanged     Progression   Progression Continue to progress workloads to maintain intensity without signs/symptoms of physical distress. Continue to progress workloads to maintain intensity without signs/symptoms of physical distress. Continue to progress workloads to maintain intensity without signs/symptoms of physical distress. Continue to progress workloads to maintain intensity without signs/symptoms of physical distress. Continue to progress workloads to maintain intensity without signs/symptoms of physical distress.   Average METs  -- 1.4 1.76 1.63 1.75     Resistance Training   Training Prescription _4    Weight 2 2 lbs 2 lbs 6 lbs 3 lbs   Reps 10-12 10-12 10-12 10-12 10-12     Interval Training   Interval Training _5      Oxygen   Oxygen _6    Liters  -- 4-6 4-6  6L on treadmill 4-6  6L on treadmill 4-6  6L on treadmill     Treadmill   MPH 0.8 0.8 1 0.8 0.8   Grade 0 0 0 0 0   Minutes 15  2/2/2/2/2 7  40mn, 5 min  _7 METs  -- 1.6 1.77 1.6 1.6     NuStep   Level _8 Watts  -- --  63 spm --  60 spm --  65 spm --  61 spm   Minutes _9 METs  -- 1.6 1.6 1.6 1.8     Arm Ergometer   Level _10 Watts  --  -- --  32 rpm --  45 rpm  --   Minutes 7.5 7._11 METs  --  1 1.9 1.6 1.6   Row Name 01/05/16 1500             Exercise Review   Progression Yes         Response to Exercise   Blood Pressure (Admit) 106/62       Blood Pressure (Exercise) 130/80       Blood Pressure (Exit) 122/56       Heart Rate (Admit) 85 bpm       Heart Rate (Exercise) 98 bpm       Heart Rate (Exit) 90 bpm       Oxygen Saturation (Admit) 93 %       Oxygen Saturation (Exercise) 89 %       Oxygen Saturation (Exit) 96 %       Rating of Perceived Exertion (Exercise) 13       Perceived Dyspnea (Exercise) 3       Symptoms none       Comments Home  Exercise Guidelines given 12/18/15       Duration Progress to 45 minutes of aerobic exercise without signs/symptoms of physical distress       Intensity THRR unchanged         Progression   Progression Continue to progress workloads to maintain intensity without signs/symptoms of physical distress.       Average METs 1.6         Resistance Training   Training Prescription Yes       Weight 3 lbs       Reps 10-12         Interval Training   Interval Training No         Oxygen   Oxygen Continuous       Liters 4-6  6L on treadmill         Treadmill   MPH 0.8       Grade 0       Minutes 15       METs 1.6         NuStep   Level 5       Watts --  88 spm       Minutes 15       METs 1.5         Home Exercise Plan   Plans to continue exercise at [employer] (comment)  [facility], Home (walk/treadmill)       Frequency Add 2 additional days to program exercise sessions.          Exercise Comments:     Exercise Comments    Row Name 10/27/15 1448 11/04/15 1356 11/11/15 1536 11/25/15 1540 12/08/15 1551   Exercise Comments Exercise goal is to be able to walk longer without getting SOB First full day of exercise was on Monday!  Patient was oriented to gym and equipment including functions, settings, policies, and procedures.  Patient's individual exercise prescription and treatment plan were reviewed.  All starting workloads were established based on the results of the 6 minute walk test done at initial orientation visit.  The plan for exercise progression was also introduced and progression will be customized based on patient's performance and goals. [name] is off to a good start with exercise.   He is doing 0.8 mph on treadmill for 2 min intervals for 8 min total!  We will continue to monitor his progression. [name] continues to do well with exercise.  He is starting to become more independent.  We will hold  off on home exercise just a little bit longer unitl he is  ready.  We will continue to monitor his progression. [name] is getting more independent with exercise.  He may be ready for home exercise soon.  We will continue to monitor his progression.   [place] Name 12/22/15 1550 01/05/16 1533         Exercise Comments [name] is doing well with exercise.  We will continue to monitor for progression. [name] has been doing well in rehab.  He is up to 88 spm on the NuStep.  We will continue to monitor his progress.         Discharge Exercise Prescription (Final Exercise Prescription Changes):     Exercise Prescription Changes - 01/05/16 1500      Exercise Review   Progression Yes     Response to Exercise   Blood Pressure (Admit) 106/62   Blood Pressure (Exercise) 130/80   Blood Pressure (Exit) 122/56   Heart Rate (Admit) 85 bpm   Heart Rate (Exercise) 98 bpm   Heart Rate (Exit) 90 bpm   Oxygen Saturation (Admit) 93 %   Oxygen Saturation (Exercise) 89 %   Oxygen Saturation (Exit) 96 %   Rating of Perceived Exertion (Exercise) 13   Perceived Dyspnea (Exercise) 3   Symptoms none   Comments Home Exercise Guidelines given 12/18/15   Duration Progress to 45 minutes of aerobic exercise without signs/symptoms of physical distress   Intensity THRR unchanged     Progression   Progression Continue to progress workloads to maintain intensity without signs/symptoms of physical distress.   Average METs 1.6     Resistance Training   Training Prescription Yes   Weight 3 lbs   Reps 10-12     Interval Training   Interval Training No     Oxygen   Oxygen Continuous   Liters 4-6  6L on treadmill     Treadmill   MPH 0.8   Grade 0   Minutes 15   METs 1.6     NuStep   Level 5   Watts --  88 spm   Minutes 15   METs 1.5     Home Exercise Plan   Plans to continue exercise at [employer] (comment)  [facility], Home (walk/treadmill)   Frequency Add 2 additional days to program exercise sessions.       Nutrition:  Target  Goals: Understanding of nutrition guidelines, daily intake of sodium <1525m, cholesterol <2010m calories 30% from fat and 7% or less from saturated fats, daily to have 5 or more servings of fruits and vegetables.  Biometrics:     Pre Biometrics - 10/27/15 1448      Pre Biometrics   Height 5' 8.5" (1.74 m)   Weight 168 lb 6.4 oz (76.4 kg)   Waist Circumference 38.5 inches   Hip Circumference 40.5 inches   Waist to Hip Ratio 0.95 %   BMI (Calculated) 25.3       Nutrition Therapy Plan and Nutrition Goals:   Nutrition Discharge: Rate Your Plate Scores:   Psychosocial: Target Goals: Acknowledge presence or absence of depression, maximize coping skills, provide positive support system. Participant is able to verbalize types and ability to use techniques and skills needed for reducing stress and depression.  Initial Review & Psychosocial Screening:     Initial Psych Review & Screening - 10/27/15 [email or web address]   Comments [name]  states he does have times he misses certain activities, such as golf, that he can no longer perform due to his shortness of breath. He has great support from his wife   .     Barriers   Psychosocial barriers to participate in program There are no identifiable barriers or psychosocial needs.;The patient should benefit from training in stress management and relaxation.     Screening Interventions   Interventions Encouraged to exercise;Program counselor consult      Quality of Life Scores:     Quality of Life - 10/27/15 1447      Quality of Life Scores   Health/Function Pre 20.69 %   Socioeconomic Pre 20.29 %   Psych/Spiritual Pre 21 %   Family Pre 21 %   GLOBAL Pre 20.71 %      PHQ-9: Recent Review Flowsheet Data    Depression screen [facility] 2/9 11/26/2015 10/27/2015 07/25/2014 05/22/2014 06/01/2012   Decreased Interest 0 0 0 0 0   Down, Depressed, Hopeless 0 0 0 0 0   PHQ - 2 Score 0 0 0 0 0   Altered  sleeping - 0 - - -   Tired, decreased energy - 1 - - -   Change in appetite - 0 - - -   Feeling bad or failure about yourself  - 0 - - -   Trouble concentrating - 0 - - -   Moving slowly or fidgety/restless - 0 - - -   Suicidal thoughts - 0 - - -   PHQ-9 Score - 1 - - -   Difficult doing work/chores - Not difficult at all - - -      Psychosocial Evaluation and Intervention:     Psychosocial Evaluation - 11/09/15 1235      Psychosocial Evaluation & Interventions   Interventions Encouraged to exercise with the program and follow exercise prescription;Stress management education;Relaxation education   Comments Counselor met with [name] ([name]) today for initial psychosocial evaluation.  He is an 83 year old gentleman who has been married 72 years and has a strong support system with adult children and active involvement in his local church community.  [name] states he sleeps well (with occasional Tylenol at bedtime) and he has a good appetite.  He denies a history or current symptoms of depression or anxiety and states he is typically in a positive mood.  He has minimal stress in his life although he "worries" some about the health of his wife as she "works too hard taking care of me."  [name] has goals to increase his stamin and strength and to breathe better while in this program.        Psychosocial Re-Evaluation:     Psychosocial Re-Evaluation    Row Name 12/08/15 1231 12/28/15 1623           Psychosocial Re-Evaluation   Comments See 30 note 12/08/15 [name] psychosocial assessment reveals no barriers at this time to participation in Pulmonary Rehab.  He  has good family and friend support that encourages [name] to participate in [place] and progress with His goals.  [name] concerns are monitored, but he  has acknowledge that attending the program has helped to maintain quality life with improved mobility, self-care, and emotional and financial stability.  [name] is commended  for regular attendance and self-motivation to improve His pulmonary disease management.        Education: Education Goals: Education classes will be provided on a weekly basis,  covering required topics. Participant will state understanding/return demonstration of topics presented.  Learning Barriers/Preferences:     Learning Barriers/Preferences - 10/27/15 1434      Learning Barriers/Preferences   Learning Barriers None   Learning Preferences None      Education Topics: Initial Evaluation Education: - Verbal, written and demonstration of respiratory meds, RPE/PD scales, oximetry and breathing techniques. Instruction on use of nebulizers and MDIs: cleaning and proper use, rinsing mouth with steroid doses and importance of monitoring MDI activations. Flowsheet Row Pulmonary Rehab from 12/23/2015 in [facility] Cardiac and Pulmonary Rehab  Date  10/27/15  Educator  LB  Instruction Review Code  2- meets goals/outcomes      General Nutrition Guidelines/Fats and Fiber: -Group instruction provided by verbal, written material, models and posters to present the general guidelines for heart healthy nutrition. Gives an explanation and review of dietary fats and fiber. Flowsheet Row Pulmonary Rehab from 12/23/2015 in [facility] Re[facility]  Date  12/07/15  Educator  CR  Instruction Review Code  2- meets goals/outcomes      Controlling Sodium/Reading Food Labels: -Group verbal and written material supporting the discussion of sodium use in heart healthy nutrition. Review and explanation with models, verbal and written materials for utilization of the food label. Flowsheet Row Pulmonary Rehab from 12/23/2015 in [facility] Cardiac and Pulmonary Rehab  Date  12/14/15  Educator  CR  Instruction Review Code  2- meets goals/outcomes      Exercise Physiology & Risk Factors: - Group verbal and written instruction with models to review the exercise physiology of the cardiovascular system and  associated critical values. Details cardiovascular disease risk factors and the goals associated with each risk factor. Flowsheet Row Pulmonary Rehab from 12/23/2015 in [facility] Cardiac and Pulmonary Rehab  Date  12/02/15  Educator  AS  Instruction Review Code  2- meets goals/outcomes      Aerobic Exercise & Resistance Training: - Gives group verbal and written discussion on the health impact of inactivity. On the components of aerobic and resistive training programs and the benefits of this training and how to safely progress through these programs.   Flexibility, Balance, General Exercise Guidelines: - Provides group verbal and written instruction on the benefits of flexibility and balance training programs. Provides general exercise guidelines with specific guidelines to those with heart or lung disease. Demonstration and skill practice provided.   Stress Management: - Provides group verbal and written instruction about the health risks of elevated stress, cause of high stress, and healthy ways to reduce stress.   Depression: - Provides group verbal and written instruction on the correlation between heart/lung disease and depressed mood, treatment options, and the stigmas associated with seeking treatment. Flowsheet Row Pulmonary Rehab from 12/23/2015 in [facility] Cardiac and Pulmonary Rehab  Date  12/21/15  Educator  [facility]  Instruction Review Code  2- meets goals/outcomes      Exercise & Equipment Safety: - Individual verbal instruction and demonstration of equipment use and safety with use of the equipment.   Infection Prevention: - Provides verbal and written material to individual with discussion of infection control including proper hand washing and proper equipment cleaning during exercise session. Flowsheet Row Pulmonary Rehab from 12/23/2015 in [facility] Cardiac and Pulmonary Rehab  Date  10/27/15  Educator  LB  Instruction Review Code  2- meets goals/outcomes      Falls  Prevention: - Provides verbal and written material to individual with discussion of falls prevention and safety. Flowsheet Row Pulmonary  Rehab from 12/23/2015 in [facility] Cardiac and Pulmonary Rehab  Date  10/27/15  Educator  LB  Instruction Review Code  2- meets goals/outcomes      Diabetes: - Individual verbal and written instruction to review signs/symptoms of diabetes, desired ranges of glucose level fasting, after meals and with exercise. Advice that pre and post exercise glucose checks will be done for 3 sessions at entry of program.   Chronic Lung Diseases: - Group verbal and written instruction to review new updates, new respiratory medications, new advancements in procedures and treatments. Provide informative websites and "800" numbers of self-education. Flowsheet Row Pulmonary Rehab from 12/23/2015 in [facility] Cardiac and Pulmonary Rehab  Date  12/16/15  Educator  LB  Instruction Review Code  2- meets goals/outcomes      Lung Procedures: - Group verbal and written instruction to describe testing methods done to diagnose lung disease. Review the outcome of test results. Describe the treatment choices: Pulmonary Function Tests, ABGs and oximetry.   Energy Conservation: - Provide group verbal and written instruction for methods to conserve energy, plan and organize activities. Instruct on pacing techniques, use of adaptive equipment and posture/positioning to relieve shortness of breath.   Triggers: - Group verbal and written instruction to review types of environmental controls: home humidity, furnaces, filters, dust mite/pet prevention, HEPA vacuums. To discuss weather changes, air quality and the benefits of nasal washing.   Exacerbations: - Group verbal and written instruction to provide: warning signs, infection symptoms, calling MD promptly, preventive modes, and value of vaccinations. Review: effective airway clearance, coughing and/or vibration techniques. Create an Advertising copywriter. Flowsheet Row Pulmonary Rehab from 12/23/2015 in [facility]  Date  12/23/15  Educator  LB  Instruction Review Code  2- meets goals/outcomes      Oxygen: - Individual and group verbal and written instruction on oxygen therapy. Includes supplement oxygen, available portable oxygen systems, continuous and intermittent flow rates, oxygen safety, concentrators, and Medicare reimbursement for oxygen.   Respiratory Medications: - Group verbal and written instruction to review medications for lung disease. Drug class, frequency, complications, importance of spacers, rinsing mouth after steroid MDI's, and proper cleaning methods for nebulizers. Flowsheet Row Pulmonary Rehab from 12/23/2015 in [facility]  Date  10/27/15  Educator  LB  Instruction Review Code  2- meets goals/outcomes      AED/CPR: - Group verbal and written instruction with the use of models to demonstrate the basic use of the AED with the basic ABC's of resuscitation.   Breathing Retraining: - Provides individuals verbal and written instruction on purpose, frequency, and proper technique of diaphragmatic breathing and pursed-lipped breathing. Applies individual practice skills. Flowsheet Row Pulmonary Rehab from 12/23/2015 in [facility]  Date  10/27/15  Educator  LB  Instruction Review Code  2- meets goals/outcomes      Anatomy and Physiology of the Lungs: - Group verbal and written instruction with the use of models to provide basic lung anatomy and physiology related to function, structure and complications of lung disease.   Heart Failure: - Group verbal and written instruction on the basics of heart failure: signs/symptoms, treatments, explanation of ejection fraction, enlarged heart and cardiomyopathy.   Sleep Apnea: - Individual verbal and written instruction to review Obstructive Sleep Apnea. Review of risk factors, methods for diagnosing and  types of masks and machines for OSA.   Anxiety: - Provides group, verbal and written instruction on the correlation between heart/lung  disease and anxiety, treatment options, and management of anxiety.   Relaxation: - Provides group, verbal and written instruction about the benefits of relaxation for patients with heart/lung disease. Also provides patients with examples of relaxation techniques. Flowsheet Row Pulmonary Rehab from 12/23/2015 in [facility]  Date  11/18/15  Educator  [facility]  Instruction Review Code  2- Meets goals/outcomes      Knowledge Questionnaire Score:     Knowledge Questionnaire Score - 10/27/15 1434      Knowledge Questionnaire Score   Pre Score 4/10       Core Components/Risk Factors/Patient Goals at Admission:     Personal Goals and Risk Factors at Admission - 10/27/15 1440      Core Components/Risk Factors/Patient Goals on Admission   Sedentary Yes  Walks driveway at home 5 days/week for 73mns   Intervention Provide advice, education, support and counseling about physical activity/exercise needs.;Develop an individualized exercise prescription for aerobic and resistive training based on initial evaluation findings, risk stratification, comorbidities and participant's personal goals.   Expected Outcomes Achievement of increased cardiorespiratory fitness and enhanced flexibility, muscular endurance and strength shown through measurements of functional capacity and personal statement of participant.   Increase Strength and Stamina Yes   Intervention Provide advice, education, support and counseling about physical activity/exercise needs.;Develop an individualized exercise prescription for aerobic and resistive training based on initial evaluation findings, risk stratification, comorbidities and participant's personal goals.   Expected Outcomes Achievement of increased cardiorespiratory fitness and enhanced flexibility, muscular endurance  and strength shown through measurements of functional capacity and personal statement of participant.   Improve shortness of breath with ADL's Yes   Intervention Provide education, individualized exercise plan and daily activity instruction to help decrease symptoms of SOB with activities of daily living.   Expected Outcomes Short Term: Achieves a reduction of symptoms when performing activities of daily living.   Develop more efficient breathing techniques such as purse lipped breathing and diaphragmatic breathing; and practicing self-pacing with activity Yes  [facility]   Intervention Provide education, demonstration and support about specific breathing techniuqes utilized for more efficient breathing. Include techniques such as pursed lipped breathing, diaphragmatic breathing and self-pacing activity.   Expected Outcomes Short Term: Participant will be able to demonstrate and use breathing techniques as needed throughout daily activities.   Increase knowledge of respiratory medications and ability to use respiratory devices properly  Yes  Albuterol MDI; Spacer given; oxygen 2-5l/m   Intervention Provide education and demonstration as needed of appropriate use of medications, inhalers, and oxygen therapy.   Expected Outcomes Short Term: Achieves understanding of medications use. Understands that oxygen is a medication prescribed by physician. Demonstrates appropriate use of inhaler and oxygen therapy.   Lipids Yes   Intervention Provide education and support for participant on nutrition & aerobic/resistive exercise along with prescribed medications to achieve LDL <76m HDL >4022m  Expected Outcomes Short Term: Participant states understanding of desired cholesterol values and is compliant with medications prescribed. Participant is following exercise prescription and nutrition guidelines.;Long Term: Cholesterol controlled with medications as prescribed, with  individualized exercise RX and with personalized nutrition plan. Value goals: LDL < 43m21mDL > 40 mg.      Core Components/Risk Factors/Patient Goals Review:      Goals and Risk Factor Review    Row Name 11/23/15 1439 12/08/15 0640 12/28/15 1614         Core Components/Risk Factors/Patient Goals Review  Personal Goals Review Sedentary;Increase Strength and Stamina;Improve shortness of breath with ADL's;Develop more efficient breathing techniques such as purse lipped breathing and diaphragmatic breathing and practicing self-pacing with activity.;Increase knowledge of respiratory medications and ability to use respiratory devices properly. Sedentary;Increase Strength and Stamina;Improve shortness of breath with ADL's;Increase knowledge of respiratory medications and ability to use respiratory devices properly. Sedentary;Increase Strength and Stamina;Improve shortness of breath with ADL's;Increase knowledge of respiratory medications and ability to use respiratory devices properly.;Develop more efficient breathing techniques such as purse lipped breathing and diaphragmatic breathing and practicing self-pacing with activity.;Lipids     Review [name] has completed 3 weeks in [facility] and states an improvement with his strength. He has recently started on Mucinex which has improved his sputum production. He has a good understanding of his Albuterol MDI and carries it with him. The spacer he uses at home. His PD's for shortness of breath are 3-4 and does not notice improvement in his breathing, although he does use PLB and adjusts his oxygen to 5l/m at home with activity. [name] walks at home his driveway with pacing and use of his home oximeter. Unfortunately he uses intermittent flow with the oxygen, so  his O2Sat's do drop in the 80's - encouraged him to rest and pace himself to keep O2Sat's 88% or greater. [name] is still walking his driveway on his days off from [place], although  with the cold weather, he has been walking at [facility]. He recently purchased new sneakers for better support for exercise. His breathing has worsened over the last couple of weeks. He physician has ordered an EKG.  [name] has had a productive cough, but the sputum has been clear. He does use a flutter valve.  [name] has progressed his goals on the NS, TM, and AE. He adjusts his oxygen from 4-6l/m with his exercise.  Recently he bought a new pair of sneakers for better support during his exercise. He is using his Albuterol MDI with the aerochamber. His physician has ordered a vibrating vest for mobilization of his secretions. [name] is compliant with his lipid medication.     Expected Outcomes  -- Continue exercising to increase his strength and help improve shortness of breath. Contact oxygen company for a larger portable tank. Continue to manage his bronchiectasis with the vibrating vest and oxygen.        Core Components/Risk Factors/Patient Goals at Discharge (Final Review):      Goals and Risk Factor Review - 12/28/15 1614      Core Components/Risk Factors/Patient Goals Review   Personal Goals Review Sedentary;Increase Strength and Stamina;Improve shortness of breath with ADL's;Increase knowledge of respiratory medications and ability to use respiratory devices properly.;Develop more efficient breathing techniques such as purse lipped breathing and diaphragmatic breathing and practicing self-pacing with activity.;Lipids   Review [name] has progressed his goals on the NS, TM, and AE. He adjusts his oxygen from 4-6l/m with his exercise.  Recently he bought a new pair of sneakers for better support during his exercise. He is using his Albuterol MDI with the aerochamber. His physician has ordered a vibrating vest for mobilization of his secretions. [name] is compliant with his lipid medication.   Expected Outcomes Continue to manage his bronchiectasis with the vibrating  vest and oxygen.      ITP Comments:     ITP Comments    Row Name 01/04/16 1609 01/05/16 1532 01/20/16 1450 02/23/16 1325     ITP Comments Ms  Sliney called today. [name] is being tested for a possible stroke on 01/03/16. He will return as soon as possible to [place]. 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  He was discharged today 01/05/16 with HHPT and possible nuero rehab after that.  We will follow with them next week to determine plan of action for pulmonary rehab. Called Randall Coscia to check how he was doing after his strike. He states he is having home therapy and has improved to "almost normal". The chest vest therapy has been helpful which he performs twice daily. He will inform us about his progress and  return to [place]. Called Randall Formby - last attended 12/31/16 due to a stroke. He had his last session of PT last week. He has a procedure coming up,  and the weather has kept him in the house. His wife will call back with more information on his return to [place].       Comments:  30 Day Note Review

## 2016-03-05 DIAGNOSIS — J449 Chronic obstructive pulmonary disease, unspecified: Secondary | ICD-10-CM | POA: Diagnosis not present

## 2016-03-09 ENCOUNTER — Encounter: Payer: Self-pay | Admitting: *Deleted

## 2016-03-10 ENCOUNTER — Encounter: Payer: Self-pay | Admitting: Family Medicine

## 2016-03-10 ENCOUNTER — Ambulatory Visit (INDEPENDENT_AMBULATORY_CARE_PROVIDER_SITE_OTHER): Payer: Medicare Other | Admitting: Family Medicine

## 2016-03-10 ENCOUNTER — Telehealth: Payer: Self-pay

## 2016-03-10 VITALS — BP 142/72 | HR 78 | Temp 98.2°F | Ht 70.0 in | Wt 164.8 lb

## 2016-03-10 DIAGNOSIS — R0981 Nasal congestion: Secondary | ICD-10-CM

## 2016-03-10 NOTE — Telephone Encounter (Signed)
Pt has appt 03/10/16 at 12:45 with Dr Deborra Medina.

## 2016-03-10 NOTE — Patient Instructions (Signed)
Great to meet you. I hope you have a happy birthday.  If you decide that you do want to see an ears, nose and throat doctor (ENT), please let me know.

## 2016-03-10 NOTE — Telephone Encounter (Signed)
PLEASE NOTE: All timestamps contained within this report are represented as [place] Standard Time. CONFIDENTIALTY NOTICE: This fax transmission is intended only for the addressee. It contains information that is legally privileged, confidential or otherwise protected from use or disclosure. If you are not the intended recipient, you are strictly prohibited from reviewing, disclosing, copying using or disseminating any of this information or taking any action in reliance on or regarding this information. If you have received this fax in error, please notify us immediately by telephone so that we can arrange for its return to [place]. Phone: [phone], Toll-Free: [phone], Fax: [phone] Page: 1 of 1 Call Id: [id number] [place] Patient Name: [name] Gender: Male DOB: 11/19/1928 Age: 83 Y 11 M 19 D Return Phone Number: [phone] (Primary), [phone] (Secondary) [place] [address] Client [place] Night - Client Client Site [place] Physician Renford Dills - MD Who Is Calling Patient / Member / Family / Caregiver Call Type Triage / Clinical Caller Name [facility] Relationship To Patient Spouse Return Phone Number [phone] (Primary) Chief Complaint Cold Symptom Reason for Call Symptomatic / Request for [name] states that her husband is on O2 and is c/o right nasal blockage, Nurse Assessment Nurse: Randall Captain, RN, Margaret Date/Time (Eastern Time): 03/09/2016 4:38:02 PM Confirm and document reason for call. If symptomatic, describe symptoms. ---Caller states that he is on O2 and his right side of his nose is stopped up. He got nose spray. Started over 6 months ago. Flonase is the nose spray. Does the PT have any chronic conditions? (i.e. diabetes, asthma,  etc.) ---Yes List chronic conditions. ---COPD, both lungs collapsed, Guidelines Guideline Title Affirmed Question Nasal Allergies (Hay Fever) Lots of coughing Disp. Time Eilene Ghazi Time) Disposition Final User 03/09/2016 4:45:27 PM See Physician within 24 Hours Yes Cockrum, RN, [name] Referrals REFERRED TO PCP OFFICE Care Advice Given Per Guideline SEE PHYSICIAN WITHIN 24 HOURS: * IF OFFICE WILL BE OPEN: You need to be seen within the next 24 hours. Call your doctor when the office opens, and make an appointment. ANTIHISTAMINE MEDICATIONS FOR HAYFEVER: AVOIDING POLLEN: * Stay indoors on windy days * Keep windows closed in home, at least in bedroom; use air conditioner. * Use a high efficiency house air filter (HEPA or electrostatic) * Do not use window fans * Keep windows closed in car, turn AC on recirculate * Avoid playing with outdoor dog CALL BACK IF: * You become worse. CARE ADVICE given per Nasal Allergies (Hay Fever) (Adult) guideline.

## 2016-03-10 NOTE — Progress Notes (Signed)
Pre visit review using our clinic review tool, if applicable. No additional management support is needed unless otherwise documented below in the visit note. 

## 2016-03-10 NOTE — Progress Notes (Signed)
SUBJECTIVE:  [name] is a 83 y.o. male pt pf Dr. Damita Dunnings, new to me, who complains of "right side of his nose being stopped up."  On O2 per [place] chronically for COPD and he is concerned he is not getting enough oxygen due to his nasal congestion.  He has been coughing. No fevers or chills.  Symptoms have been ongoing for 6 months.   Current Outpatient Prescriptions on File Prior to Visit  Medication Sig Dispense Refill  . acetaminophen (TYLENOL) 325 MG tablet Take 650 mg by mouth every 6 (six) hours as needed for mild pain.     Marland Kitchen albuterol (PROAIR HFA) 108 (90 Base) MCG/ACT inhaler Inhale 2 puffs into the lungs every 4 (four) hours as needed for wheezing or shortness of breath. 1 Inhaler 2  . Cyanocobalamin (VITAMIN B 12 PO) Take by mouth daily.    Marland Kitchen FIBER PO Take 1 capsule by mouth 2 (two) times daily.    . fluticasone (FLONASE) 50 MCG/ACT nasal spray Place 2 sprays into both nostrils daily. (Patient taking differently: Place 2 sprays into both nostrils daily as needed for allergies. )    . metoprolol tartrate (LOPRESSOR) 25 MG tablet Take 0.5 tablets (12.5 mg total) by mouth as needed (for palpitations).    . Multiple Vitamin (MULTIVITAMIN) tablet Take 1 tablet by mouth daily. Reported on 04/22/2015    . multivitamin-lutein (OCUVITE-LUTEIN) CAPS capsule Take 1 capsule by mouth daily.    . NON FORMULARY Oxygen 2-5 liters 24/7    . omeprazole (PRILOSEC) 20 MG capsule Take 20 mg by mouth daily.      . pravastatin (PRAVACHOL) 40 MG tablet Take 0.5 tablets (20 mg total) by mouth 2 (two) times a week. 30 tablet 0  . Respiratory Therapy Supplies (FLUTTER) DEVI Use as directed 1 each 0  . rivaroxaban (XARELTO) 20 MG TABS tablet Take 1 tablet (20 mg total) by mouth daily with supper. 90 tablet 1  . tamsulosin (FLOMAX) 0.4 MG CAPS capsule Take 2 capsules (0.8 mg total) by mouth daily. 180 capsule 3  . tiotropium (SPIRIVA) 18 MCG inhalation capsule Place 1 capsule (18 mcg total) into inhaler  and inhale daily. 30 capsule 6  . traMADol (ULTRAM) 50 MG tablet TAKE 1 TABLET BY MOUTH 12 AS NEEDED FOR PAIN 60 tablet 2  . traZODone (DESYREL) 100 MG tablet TAKE 2 TABLETS (200 MG TOTAL) BY MOUTH AT BEDTIME. 180 tablet 3   No current facility-administered medications on file prior to visit.     Allergies  Allergen Reactions  . Rosuvastatin     REACTION: myalgias at 67m/day dose    Past Medical History:  Diagnosis Date  . AF (atrial fibrillation) ([place]    after PTX 2012  . Allergy   . Arthritis    h/o R shoulder injection per ortho  . Back pain   . BPH (benign prostatic hyperplasia)   . COPD (chronic obstructive pulmonary disease) ([place]   . Coronary artery disease   . Elevated MCV   . Erectile dysfunction   . GERD (gastroesophageal reflux disease)   . HSV-1 (herpes simplex virus 1) infection   . Hyperlipidemia   . Spontaneous pneumothorax 09/04/2010   [name]    Past Surgical History:  Procedure Laterality Date  . CATARACT EXTRACTION, BILATERAL    . CHEST TUBE PLACEMENT  09/04/2010  . VIDEO ASSISTED THORACOSCOPY  12/15/2010   Procedure: VIDEO ASSISTED THORACOSCOPY;  Surgeon: SMelrose Nakayama MD;  [facility]  Service: Thoracic;  Laterality: Left;  blebectomy with pleural abrasion  . VIDEO ASSISTED THORACOSCOPY Right 06/28/2013   Procedure: VIDEO ASSISTED THORACOSCOPY with stapling of blebs;  Surgeon: Melrose Nakayama, MD;  Location: [place];  Service: Thoracic;  Laterality: Right;  (R) VATS W/BLEBECTOMY    Family History  Problem Relation Age of Onset  . Diabetes Mother   . Peripheral Artery Disease Father   . Allergy (severe) Father     Contrast dye - died from after having an MRI  . Colon cancer Brother     possible dx, not certain  . Hypertension    . Stroke Brother   . Prostate cancer Neg Hx   . Heart attack Neg Hx     Social History   Social History  . Marital status: Married    Spouse name: N/A  . Number of children: N/A  . Years  of education: N/A   Occupational History  . Not on file.   Social History Main Topics  . Smoking status: Former Smoker    Packs/day: 1.00    Years: 50.00    Types: Cigarettes    Quit date: 04/14/1999  . Smokeless tobacco: Never Used  . Alcohol use No  . Drug use: No  . Sexual activity: Yes    Birth control/ protection: None   Other Topics Concern  . Not on file   Social History Narrative   Occupation:Retired [employer]   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  OBJECTIVE: BP (!) 142/72   Pulse 78   Temp 98.2 F (36.8 C) (Oral)   Ht _0  (1.778 m)   Wt 164 lb 12.8 oz (74.8 kg)   SpO2 91%   BMI 23.65 kg/m   He appears well, vital signs are as noted. O2 per , Ears normal.  Throat and pharynx normal.  Neck supple. No adenopathy in the neck. Nose is congested. Sinuses non tender. The chest is clear, without wheezes or rales.  ASSESSMENT:  allergic rhinitis with ? Nasal or sinus polyp  PLAN: Advised seeing an ENT to rule out nasal polyp.  He will think about it and get back to me or his PCP.

## 2016-03-14 ENCOUNTER — Encounter: Payer: Medicare Other | Attending: Pulmonary Disease

## 2016-03-14 DIAGNOSIS — J479 Bronchiectasis, uncomplicated: Secondary | ICD-10-CM

## 2016-03-14 DIAGNOSIS — J9611 Chronic respiratory failure with hypoxia: Secondary | ICD-10-CM | POA: Insufficient documentation

## 2016-03-14 DIAGNOSIS — J449 Chronic obstructive pulmonary disease, unspecified: Secondary | ICD-10-CM

## 2016-03-14 DIAGNOSIS — J439 Emphysema, unspecified: Secondary | ICD-10-CM | POA: Insufficient documentation

## 2016-03-14 NOTE — Progress Notes (Signed)
Daily Session Note  Patient Details  Name: [name] MRN: [id number] Date of Birth: 10/19/1928 Referring Provider:   Flowsheet Row Pulmonary Rehab from 10/27/2015 in [facility] Cardiac and Pulmonary Rehab  Referring Provider  Simonne Maffucci MD      Encounter Date: 03/14/2016  Check In:     Session Check In - 03/14/16 1501      Check-In   Location [facility]-Cardiac & Pulmonary Rehab   Staff Present Carson Myrtle, BS, RRT, Respiratory Therapist;Kelly Amedeo Plenty, BS, ACSM CEP, Exercise Physiologist;Amanda Oletta Darter, BA, ACSM CEP, Exercise Physiologist   Supervising physician immediately available to respond to emergencies [facility] immediately available ER MD   Physician(s) Jimmye Emmanuell and Quentin Cornwall   Medication changes reported     No   Fall or balance concerns reported    No   Warm-up and Cool-down Performed as group-led Location manager Performed Yes   VAD Patient? No         History  Smoking Status  . Former Smoker  . Packs/day: 1.00  . Years: 50.00  . Types: Cigarettes  . Quit date: 04/14/1999  Smokeless Tobacco  . Never Used    Goals Met:  Proper associated with RPD/PD & O2 Sat Independence with exercise equipment Exercise tolerated well Strength training completed today  Goals Unmet:  Not Applicable  Comments: First day back after extended absence.   Dr. Emily Filbert is Medical Director for [place] and [facility].

## 2016-03-16 ENCOUNTER — Encounter: Payer: Medicare Other | Admitting: *Deleted

## 2016-03-16 DIAGNOSIS — J479 Bronchiectasis, uncomplicated: Secondary | ICD-10-CM

## 2016-03-16 DIAGNOSIS — J439 Emphysema, unspecified: Secondary | ICD-10-CM | POA: Diagnosis not present

## 2016-03-16 DIAGNOSIS — G629 Polyneuropathy, unspecified: Secondary | ICD-10-CM | POA: Diagnosis not present

## 2016-03-16 DIAGNOSIS — J449 Chronic obstructive pulmonary disease, unspecified: Secondary | ICD-10-CM

## 2016-03-16 DIAGNOSIS — J9611 Chronic respiratory failure with hypoxia: Secondary | ICD-10-CM | POA: Diagnosis not present

## 2016-03-16 NOTE — Progress Notes (Signed)
Daily Session Note  Patient Details  Name: [name] MRN: [id number] Date of Birth: 1928/02/07 Referring Provider:   Flowsheet Row Pulmonary Rehab from 10/27/2015 in [facility] Cardiac and Pulmonary Rehab  Referring Provider  Simonne Maffucci MD      Encounter Date: 03/16/2016  Check In:     Session Check In - 03/16/16 1337      Check-In   Location [facility]-Cardiac & Pulmonary Rehab   Staff Present Alberteen Sam, MA, ACSM RCEP, Exercise Physiologist;Laureen Owens Shark, BS, RRT, Respiratory Dareen Piano, BA, ACSM CEP, Exercise Physiologist   Supervising physician immediately available to respond to emergencies [facility] immediately available ER MD   Physician(s) Drs. Jimmye Crews and Paduchowski   Medication changes reported     No   Fall or balance concerns reported    No   Warm-up and Cool-down Performed as group-led Location manager Performed Yes   VAD Patient? No     Pain Assessment   Currently in Pain? No/denies   Multiple Pain Sites No         History  Smoking Status  . Former Smoker  . Packs/day: 1.00  . Years: 50.00  . Types: Cigarettes  . Quit date: 04/14/1999  Smokeless Tobacco  . Never Used    Goals Met:  Proper associated with RPD/PD & O2 Sat Independence with exercise equipment Using PLB without cueing & demonstrates good technique Exercise tolerated well No report of cardiac concerns or symptoms Strength training completed today  Goals Unmet:  Not Applicable  Comments: Pt able to follow exercise prescription today without complaint.  Will continue to monitor for progression.    Dr. Emily Filbert is Medical Director for [place] and [facility].

## 2016-03-18 ENCOUNTER — Encounter: Payer: Medicare Other | Attending: Pulmonary Disease

## 2016-03-18 DIAGNOSIS — J439 Emphysema, unspecified: Secondary | ICD-10-CM | POA: Insufficient documentation

## 2016-03-18 DIAGNOSIS — J9611 Chronic respiratory failure with hypoxia: Secondary | ICD-10-CM | POA: Insufficient documentation

## 2016-03-21 DIAGNOSIS — J449 Chronic obstructive pulmonary disease, unspecified: Secondary | ICD-10-CM

## 2016-03-21 DIAGNOSIS — J439 Emphysema, unspecified: Secondary | ICD-10-CM | POA: Diagnosis not present

## 2016-03-21 DIAGNOSIS — J479 Bronchiectasis, uncomplicated: Secondary | ICD-10-CM

## 2016-03-21 DIAGNOSIS — J9611 Chronic respiratory failure with hypoxia: Secondary | ICD-10-CM | POA: Diagnosis not present

## 2016-03-21 NOTE — Progress Notes (Signed)
Daily Session Note  Patient Details  Name: [name] MRN: [id number] Date of Birth: 06-06-1928 Referring Provider:   Flowsheet Row Pulmonary Rehab from 10/27/2015 in [facility]  Referring Provider  Simonne Maffucci MD      Encounter Date: 03/21/2016  Check In:     Session Check In - 03/21/16 1411      Check-In   Location [facility]-Cardiac & Pulmonary Rehab   Staff Present Carson Myrtle, BS, RRT, Respiratory Therapist;Kelly Amedeo Plenty, BS, ACSM CEP, Exercise Physiologist;Jacoby Zanni Oletta Darter, BA, ACSM CEP, Exercise Physiologist   Supervising physician immediately available to respond to emergencies [facility] immediately available ER MD   Physician(s) Corky Downs and Jimmye Deondrae   Medication changes reported     No   Fall or balance concerns reported    No   Tobacco Cessation No Change   Warm-up and Cool-down Performed as group-led instruction   Resistance Training Performed Yes   VAD Patient? No     Pain Assessment   Currently in Pain? No/denies         History  Smoking Status  . Former Smoker  . Packs/day: 1.00  . Years: 50.00  . Types: Cigarettes  . Quit date: 04/14/1999  Smokeless Tobacco  . Never Used    Goals Met:  Proper associated with RPD/PD & O2 Sat Independence with exercise equipment Exercise tolerated well Strength training completed today  Goals Unmet:  Not Applicable  Comments: Pt able to follow exercise prescription today without complaint.  Will continue to monitor for progression.    Dr. Emily Filbert is Medical Director for [facility] and [facility].

## 2016-03-28 ENCOUNTER — Encounter: Payer: Self-pay | Admitting: *Deleted

## 2016-03-28 DIAGNOSIS — J9611 Chronic respiratory failure with hypoxia: Secondary | ICD-10-CM

## 2016-03-28 DIAGNOSIS — J449 Chronic obstructive pulmonary disease, unspecified: Secondary | ICD-10-CM

## 2016-03-28 DIAGNOSIS — J479 Bronchiectasis, uncomplicated: Secondary | ICD-10-CM

## 2016-03-28 NOTE — Progress Notes (Unsigned)
Pulmonary Individual Treatment Plan  Patient Details  Name: [name] MRN: [id number] Date of Birth: 1928/05/08 Referring Provider:   Flowsheet Row Pulmonary Rehab from 10/27/2015 in [facility] Cardiac and Pulmonary Rehab  Referring Provider  Simonne Maffucci MD      Initial Encounter Date:  Flowsheet Row Pulmonary Rehab from 10/27/2015 in [facility] Cardiac and Pulmonary Rehab  Date  10/27/15  Referring Provider  Simonne Maffucci MD      Visit Diagnosis: COPD, mild ([place])  Bronchiectasis without complication ([place])  Chronic respiratory failure with hypoxia ([place])  Patient's Home Medications on Admission:  Current Outpatient Prescriptions:  .  acetaminophen (TYLENOL) 325 MG tablet, Take 650 mg by mouth every 6 (six) hours as needed for mild pain. , Disp: , Rfl:  .  albuterol (PROAIR HFA) 108 (90 Base) MCG/ACT inhaler, Inhale 2 puffs into the lungs every 4 (four) hours as needed for wheezing or shortness of breath., Disp: 1 Inhaler, Rfl: 2 .  Cyanocobalamin (VITAMIN B 12 PO), Take by mouth daily., Disp: , Rfl:  .  FIBER PO, Take 1 capsule by mouth 2 (two) times daily., Disp: , Rfl:  .  fluticasone (FLONASE) 50 MCG/ACT nasal spray, Place 2 sprays into both nostrils daily. (Patient taking differently: Place 2 sprays into both nostrils daily as needed for allergies. ), Disp: , Rfl:  .  metoprolol tartrate (LOPRESSOR) 25 MG tablet, Take 0.5 tablets (12.5 mg total) by mouth as needed (for palpitations)., Disp: , Rfl:  .  Multiple Vitamin (MULTIVITAMIN) tablet, Take 1 tablet by mouth daily. Reported on 04/22/2015, Disp: , Rfl:  .  multivitamin-lutein (OCUVITE-LUTEIN) CAPS capsule, Take 1 capsule by mouth daily., Disp: , Rfl:  .  NON FORMULARY, Oxygen 2-5 liters 24/7, Disp: , Rfl:  .  omeprazole (PRILOSEC) 20 MG capsule, Take 20 mg by mouth daily.  , Disp: , Rfl:  .  pravastatin (PRAVACHOL) 40 MG tablet, Take 0.5 tablets (20 mg total) by mouth 2 (two) times a week., Disp: 30 tablet, Rfl: 0 .   Respiratory Therapy Supplies (FLUTTER) DEVI, Use as directed, Disp: 1 each, Rfl: 0 .  rivaroxaban (XARELTO) 20 MG TABS tablet, Take 1 tablet (20 mg total) by mouth daily with supper., Disp: 90 tablet, Rfl: 1 .  tamsulosin (FLOMAX) 0.4 MG CAPS capsule, Take 2 capsules (0.8 mg total) by mouth daily., Disp: 180 capsule, Rfl: 3 .  tiotropium (SPIRIVA) 18 MCG inhalation capsule, Place 1 capsule (18 mcg total) into inhaler and inhale daily., Disp: 30 capsule, Rfl: 6 .  traMADol (ULTRAM) 50 MG tablet, TAKE 1 TABLET BY MOUTH 12 AS NEEDED FOR PAIN, Disp: 60 tablet, Rfl: 2 .  traZODone (DESYREL) 100 MG tablet, TAKE 2 TABLETS (200 MG TOTAL) BY MOUTH AT BEDTIME., Disp: 180 tablet, Rfl: 3  Past Medical History: Past Medical History:  Diagnosis Date  . AF (atrial fibrillation) ([place])    after PTX 2012  . Allergy   . Arthritis    h/o R shoulder injection per ortho  . Back pain   . BPH (benign prostatic hyperplasia)   . COPD (chronic obstructive pulmonary disease) ([place])   . Coronary artery disease   . Elevated MCV   . Erectile dysfunction   . GERD (gastroesophageal reflux disease)   . HSV-1 (herpes simplex virus 1) infection   . Hyperlipidemia   . Spontaneous pneumothorax 09/04/2010   [name]    Tobacco Use: History  Smoking Status  . Former Smoker  . Packs/day: 1.00  . Years: 50.00  .  Types: Cigarettes  . Quit date: 04/14/1999  Smokeless Tobacco  . Never Used    Labs: Recent Review Flowsheet Data    Labs for ITP Cardiac and Pulmonary Rehab Latest Ref Rng & Units 05/19/2014 09/15/2014 11/26/2015 01/03/2016 01/04/2016   Cholestrol 0 - 200 mg/dL 188 - 199 - 192   LDLCALC 0 - 99 mg/dL 132(H) - 124(H) - 138(H)   LDLDIRECT mg/dL - - - - -   HDL >40 mg/dL 31.50(L) - 40.60 - 36(L)   Trlycerides <150 mg/dL 124.0 - 172.0(H) - 90   Hemoglobin A1c 4.8 - 5.6 % - 5.7 5.4 - 5.4   PHART 7.350 - 7.450 - - - - -   PCO2ART 35.0 - 45.0 mmHg - - - - -   HCO3 20.0 - 24.0 mEq/L - - - - -   TCO2 0 - 100  mmol/L - - - 26 -   ACIDBASEDEF 0.0 - 2.0 mmol/L - - - - -   O2SAT % - - - - -       ADL UCSD:     Pulmonary Assessment Scores    Row Name 10/27/15 1435 12/23/15 1317 12/30/15 0753     ADL UCSD   ADL Phase Entry Mid Mid   SOB Score total 77 91  -   Rest 0 1  -   Walk 3 3  -   Stairs 4 5  -   Bath 5 4  -   Dress 3 4  -   Shop 3 4  -      Pulmonary Function Assessment:     Pulmonary Function Assessment - 10/27/15 1434      Initial Spirometry Results   FVC% 102 %   FEV1% 79 %   FEV1/FVC Ratio 54   Comments Test done 09/11/15     Post Bronchodilator Spirometry Results   FVC% 103 %   FEV1% 86 %   FEV1/FVC Ratio 58     Breath   Bilateral Breath Sounds Clear;Decreased   Shortness of Breath Yes;Limiting activity;Fear of Shortness of Breath      Exercise Target Goals:    Exercise Program Goal: Individual exercise prescription set with THRR, safety & activity barriers. Participant demonstrates ability to understand and report RPE using BORG scale, to self-measure pulse accurately, and to acknowledge the importance of the exercise prescription.  Exercise Prescription Goal: Starting with aerobic activity 30 plus minutes a day, 3 days per week for initial exercise prescription. Provide home exercise prescription and guidelines that participant acknowledges understanding prior to discharge.  Activity Barriers & Risk Stratification:     Activity Barriers & Cardiac Risk Stratification - 10/27/15 1433      Activity Barriers & Cardiac Risk Stratification   Activity Barriers Shortness of Breath;Deconditioning;Joint Problems   Cardiac Risk Stratification Moderate      6 Minute Walk:     6 Minute Walk    Row Name 10/27/15 1438 12/23/15 1318       6 Minute Walk   Phase Initial Mid Program    Distance 220 feet 460 feet    Walk Time 2.25 minutes 4.27 minutes    # of Rest Breaks 2  12 sec, 3:33 5  20 sec, 10 sec, 24 sec, 20 sec, 30 sec    MPH 1.11 1.22    METS  1.84 1.9    RPE 13 13    Perceived Dyspnea  3 4    VO2 Peak 1.33 3.36  Symptoms No Yes (comment)    Comments  - SOB    Resting HR 81 bpm 88 bpm    Resting BP 134/64 126/70    Max Ex. HR 89 bpm 113 bpm    Max Ex. BP 136/74 134/66    2 Minute Post BP 136/64 126/64      Interval HR   Baseline HR 81 88    1 Minute HR 96 115    2 Minute HR 98 107    3 Minute HR 89 112    4 Minute HR 76 88    5 Minute HR 68 106    6 Minute HR 87 106    2 Minute Post HR 79 75    Interval Heart Rate? Yes Yes      Interval Oxygen   Interval Oxygen? Yes Yes    Baseline Oxygen Saturation % 93 % 93 %    Baseline Liters of Oxygen 5 L  pulsed 5 L  pulsed    1 Minute Oxygen Saturation % 88 %  rest :58-1:10 SOB 85 %  84% 1:03-1:23    1 Minute Liters of Oxygen 5 L 6 L  continuous    2 Minute Oxygen Saturation % 81 %  at 1:22 dropped to 84% 89 %  rest 2:18-228, 84% 2:51-3:15    2 Minute Liters of Oxygen 5 L 6 L    3 Minute Oxygen Saturation % 83 %  began seated rest at 3 min 85 %    3 Minute Liters of Oxygen 5 L 8 L    4 Minute Oxygen Saturation % 86 % 92 %  82% 3:38-3:58    4 Minute Liters of Oxygen 5 L 8 L    5 Minute Oxygen Saturation % 88 %  recoved to 90% at 4:55 walking resumed 84 %  84% 5:00-5:30    5 Minute Liters of Oxygen 5 L 8 L    6 Minute Oxygen Saturation % 91 % 93 %    6 Minute Liters of Oxygen 5 L 8 L    2 Minute Post Oxygen Saturation % 91 % 92 %    2 Minute Post Liters of Oxygen 5 L 8 L      Oxygen Initial Assessment:     Oxygen Initial Assessment - 03/28/16 0756      Home Oxygen   Home Oxygen Device Portable Concentrator;Home Concentrator   Sleep Oxygen Prescription Continuous   Liters per minute --  2-5 L/min   Home Exercise Oxygen Prescription Continuous   Liters per minute --  2-5 L/min   Home at Rest Exercise Oxygen Prescription Continuous   Liters per minute --  2-5 L/min   Compliance with Home Oxygen Use Yes     Initial 6 min Walk   Oxygen Used  Continuous   Liters per minute 5   Resting Oxygen Saturation  during 6 min walk 93 %   Exercise Oxygen Saturation  during 6 min walk 81 %     Program Oxygen Prescription   Program Oxygen Prescription Continuous   Liters per minute --  4-6 L/min     Intervention   Short Term Goals To learn and exhibit compliance with exercise, home and travel O2 prescription;To learn and understand importance of monitoring SPO2 with pulse oximeter and demonstrate accurate use of the pulse oximeter.;To Learn and understand importance of maintaining oxygen saturations>88%;To learn and demonstrate proper use of respiratory medications;To learn and demonstrate proper purse lipped  breathing techniques or other breathing techniques.   Long  Term Goals Exhibits compliance with exercise, home and travel O2 prescription;Maintenance of O2 saturations>88%;Compliance with respiratory medication;Verbalizes importance of monitoring SPO2 with pulse oximeter and return demonstration;Exhibits proper breathing techniques, such as purse lipped breathing or other method taught during program session;Demonstrates proper use of MDI's      Oxygen Re-Evaluation:   Oxygen Discharge (Final Oxygen Re-Evaluation):   Initial Exercise Prescription:     Initial Exercise Prescription - 10/27/15 1400      Date of Initial Exercise RX and Referring Provider   Date 10/27/15   Referring Provider Simonne Maffucci MD     Oxygen   Oxygen Continuous   Liters 4  may need 6L on treadmill     Treadmill   MPH 0.5   Grade 0   Minutes 15   METs 1.4     NuStep   Level 1   SPM --  60-80 spm   Minutes 15  38mn x3   METs 1.5     Arm Ergometer   Level 1   Watts --  25-35 rpm   Minutes 15  5 min x3   METs 1.4     Prescription Details   Frequency (times per week) 3   Duration Progress to 45 minutes of aerobic exercise without signs/symptoms of physical distress     Intensity   THRR 40-80% of Max Heartrate 102-124    Ratings of Perceived Exertion 11-15   Perceived Dyspnea 0-4     Progression   Progression Continue to progress workloads to maintain intensity without signs/symptoms of physical distress.     Resistance Training   Training Prescription Yes   Weight 2 lbs   Reps 10-12      Perform Capillary Blood Glucose checks as needed.  Exercise Prescription Changes:     Exercise Prescription Changes    Row Name 11/02/15 1500 11/11/15 1500 11/25/15 1500 12/08/15 1500 12/22/15 1500     Response to Exercise   Blood Pressure (Admit) 120/70 110/62 138/68 152/80 142/70   Blood Pressure (Exercise) 138/82 138/70 128/82 150/76 136/78   Blood Pressure (Exit) 132/70 126/64 138/80 150/82 134/82   Heart Rate (Admit) 79 bpm 78 bpm 76 bpm 70 bpm 86 bpm   Heart Rate (Exercise) 100 bpm 78 bpm 108 bpm 102 bpm 125 bpm   Heart Rate (Exit) 89 bpm 75 bpm 107 bpm 92 bpm 90 bpm   Oxygen Saturation (Admit) 92 % 92 % 92 % 94 % 91 %   Oxygen Saturation (Exercise) 88 % 94 % 89 % 90 % 91 %   Oxygen Saturation (Exit) 98 % 99 % 90 % 98 % 97 %   Rating of Perceived Exertion (Exercise) _0 Perceived Dyspnea (Exercise) _1 Symptoms _2    Duration Progress to 45 minutes of aerobic exercise without signs/symptoms of physical distress Progress to 45 minutes of aerobic exercise without signs/symptoms of physical distress Progress to 45 minutes of aerobic exercise without signs/symptoms of physical distress Progress to 45 minutes of aerobic exercise without signs/symptoms of physical distress Progress to 45 minutes of aerobic exercise without signs/symptoms of physical distress   Intensity _3      Progression   Progression Continue to progress workloads to maintain intensity without signs/symptoms of physical distress. Continue to progress workloads to maintain intensity without signs/symptoms of  physical distress.  Continue to progress workloads to maintain intensity without signs/symptoms of physical distress. Continue to progress workloads to maintain intensity without signs/symptoms of physical distress. Continue to progress workloads to maintain intensity without signs/symptoms of physical distress.   Average METs  - 1.4 1.76 1.63 1.75     Resistance Training   Training Prescription _0    Weight 2 2 lbs 2 lbs 6 lbs 3 lbs   Reps 10-12 10-12 10-12 10-12 10-12     Interval Training   Interval Training _1      Oxygen   Oxygen _2    Liters  - 4-6 4-6  6L on treadmill 4-6  6L on treadmill 4-6  6L on treadmill     Treadmill   MPH 0.8 0.8 1 0.8 0.8   Grade 0 0 0 0 0   Minutes 15  2/2/2/2/2 7  43mn, 5 min  _3 METs  - 1.6 1.77 1.6 1.6     NuStep   Level _4 SPM  - -  63 spm -  60 spm -  65 spm -  61 spm   Minutes _5 METs  - 1.6 1.6 1.6 1.8     Arm Ergometer   Level _6 Watts  -  - -  32 rpm -  45 rpm  -   Minutes 7.5 7._7 METs  - 1 1.9 1.6 1.6     Exercise Review   Progression _8    Row Name 01/05/16 1500 03/16/16 1500           Response to Exercise   Blood Pressure (Admit) 106/62 128/64      Blood Pressure (Exercise) 130/80 128/66      Blood Pressure (Exit) 122/56 120/70      Heart Rate (Admit) 85 bpm 69 bpm      Heart Rate (Exercise) 98 bpm 92 bpm      Heart Rate (Exit) 90 bpm 75 bpm      Oxygen Saturation (Admit) 93 % 94 %      Oxygen Saturation (Exercise) 89 % 94 %      Oxygen Saturation (Exit) 96 % 98 %      Rating of Perceived Exertion (Exercise) 13 13      Perceived Dyspnea (Exercise) 3 3      Symptoms none none      Comments Home Exercise Guidelines given 12/18/15 Second day back from 2 month absence      Duration Progress to 45 minutes of aerobic exercise without signs/symptoms of physical distress Progress to 45 minutes of  aerobic exercise without signs/symptoms of physical distress      Intensity THRR unchanged THRR unchanged        Progression   Progression Continue to progress workloads to maintain intensity without signs/symptoms of physical distress. Continue to progress workloads to maintain intensity without signs/symptoms of physical distress.      Average METs 1.6 1.6        Resistance Training   Training Prescription Yes Yes      Weight 3 lbs 3 lbs      Reps 10-12 10-15        Interval Training   Interval Training No No        Oxygen  Oxygen Continuous Continuous      Liters 4-6  6L on treadmill 4-6  6L on treadmill        Treadmill   MPH 0.8 0.8      Grade 0 0      Minutes 15 12      METs 1.6 1.6        NuStep   Level 5 2      SPM -  88 spm  -      Minutes 15 15      METs 1.5  -        Biostep-RELP   Level  - 3      Minutes  - 15        Home Exercise Plan   Plans to continue exercise at [employer] [employer] (comment)  [facility], Home (walk/treadmill) [facility] (comment)  [facility], Home (walk/treadmill)      Frequency Add 2 additional days to program exercise sessions. Add 2 additional days to program exercise sessions.      Initial Home Exercises Provided  - 12/18/15        Exercise Review   Progression Yes  -         Exercise Comments:     Exercise Comments    Row Name 10/27/15 1448 11/04/15 1356 11/11/15 1536 11/25/15 1540 12/08/15 1551   Exercise Comments Exercise goal is to be able to walk longer without getting SOB First full day of exercise was on Monday!  Patient was oriented to gym and equipment including functions, settings, policies, and procedures.  Patient's individual exercise prescription and treatment plan were reviewed.  All starting workloads were established based on the results of the 6 minute walk test done at initial orientation visit.  The plan for exercise progression was also introduced and progression will be customized  based on patient's performance and goals. [name] is off to a good start with exercise.   He is doing 0.8 mph on treadmill for 2 min intervals for 8 min total!  We will continue to monitor his progression. [name] continues to do well with exercise.  He is starting to become more independent.  We will hold off on home exercise just a little bit longer unitl he is ready.  We will continue to monitor his progression. [name] is getting more independent with exercise.  He may be ready for home exercise soon.  We will continue to monitor his progression.   [place] Name 12/22/15 1550 01/05/16 1533         Exercise Comments [name] is doing well with exercise.  We will continue to monitor for progression. [name] has been doing well in rehab.  He is up to 88 spm on the NuStep.  We will continue to monitor his progress.         Exercise Goals and Review:   Exercise Goals Re-Evaluation :     Exercise Goals Re-Evaluation    Row Name 03/16/16 1521             Exercise Goal Re-Evaluation   Exercise Goals Review Increase Physical Activity;Increase Strenth and Stamina       Comments [name] has returned after being out with a stroke.  He was able to return with reduced workloads as he had been out for over 2 months.  We will work with returning him back to where he was previously.       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  Long: We will revisit adding back in his home exercise reviewed back in December          Discharge Exercise Prescription (Final Exercise Prescription Changes):     Exercise Prescription Changes - 03/16/16 1500      Response to Exercise   Blood Pressure (Admit) 128/64   Blood Pressure (Exercise) 128/66   Blood Pressure (Exit) 120/70   Heart Rate (Admit) 69 bpm   Heart Rate (Exercise) 92 bpm   Heart Rate (Exit) 75 bpm   Oxygen Saturation (Admit) 94 %   Oxygen Saturation (Exercise) 94 %   Oxygen Saturation (Exit) 98 %    Rating of Perceived Exertion (Exercise) 13   Perceived Dyspnea (Exercise) 3   Symptoms none   Comments Second day back from 2 month absence   Duration Progress to 45 minutes of aerobic exercise without signs/symptoms of physical distress   Intensity THRR unchanged     Progression   Progression Continue to progress workloads to maintain intensity without signs/symptoms of physical distress.   Average METs 1.6     Resistance Training   Training Prescription Yes   Weight 3 lbs   Reps 10-15     Interval Training   Interval Training No     Oxygen   Oxygen Continuous   Liters 4-6  6L on treadmill     Treadmill   MPH 0.8   Grade 0   Minutes 12   METs 1.6     NuStep   Level 2   Minutes 15     Biostep-RELP   Level 3   Minutes 15     Home Exercise Plan   Plans to continue exercise at [facility] (comment)  [facility], Home (walk/treadmill)   Frequency Add 2 additional days to program exercise sessions.   Initial Home Exercises Provided 12/18/15      Nutrition:  Target Goals: Understanding of nutrition guidelines, daily intake of sodium <1588m, cholesterol <2036m calories 30% from fat and 7% or less from saturated fats, daily to have 5 or more servings of fruits and vegetables.  Biometrics:     Pre Biometrics - 10/27/15 1448      Pre Biometrics   Height 5' 8.5" (1.74 m)   Weight 168 lb 6.4 oz (76.4 kg)   Waist Circumference 38.5 inches   Hip Circumference 40.5 inches   Waist to Hip Ratio 0.95 %   BMI (Calculated) 25.3       Nutrition Therapy Plan and Nutrition Goals:   Nutrition Discharge: Rate Your Plate Scores:   Nutrition Goals Re-Evaluation:   Nutrition Goals Discharge (Final Nutrition Goals Re-Evaluation):   Psychosocial: Target Goals: Acknowledge presence or absence of significant depression and/or stress, maximize coping skills, provide positive support system. Participant is able to verbalize types and ability to use  techniques and skills needed for reducing stress and depression.   Initial Review & Psychosocial Screening:     Initial Psych Review & Screening - 10/27/15 14OverlyYes   Comments [name] he does have times he misses certain activities, such as golf, that he can no longer perform due to his shortness of breath. He has great support from his wife   .     Barriers   Psychosocial barriers to participate in program There are no identifiable barriers or psychosocial needs.;The patient should benefit from training in stress  management and relaxation.     Screening Interventions   Interventions Encouraged to exercise;Program counselor consult      Quality of Life Scores:     Quality of Life - 10/27/15 1447      Quality of Life Scores   Health/Function Pre 20.69 %   Socioeconomic Pre 20.29 %   Psych/Spiritual Pre 21 %   Family Pre 21 %   GLOBAL Pre 20.71 %      PHQ-9: Recent Review Flowsheet Data    Depression screen [facility] 2/9 11/26/2015 10/27/2015 07/25/2014 05/22/2014 06/01/2012   Decreased Interest 0 0 0 0 0   Down, Depressed, Hopeless 0 0 0 0 0   PHQ - 2 Score 0 0 0 0 0   Altered sleeping - 0 - - -   Tired, decreased energy - 1 - - -   Change in appetite - 0 - - -   Feeling bad or failure about yourself  - 0 - - -   Trouble concentrating - 0 - - -   Moving slowly or fidgety/restless - 0 - - -   Suicidal thoughts - 0 - - -   PHQ-9 Score - 1 - - -   Difficult doing work/chores - Not difficult at all - - -     Interpretation of Total Score  Total Score Depression Severity:  1-4 = Minimal depression, 5-9 = Mild depression, 10-14 = Moderate depression, 15-19 = Moderately severe depression, 20-27 = Severe depression   Psychosocial Evaluation and Intervention:     Psychosocial Evaluation - 11/09/15 1235      Psychosocial Evaluation & Interventions   Interventions Encouraged to exercise with the program and follow exercise  prescription;Stress management education;Relaxation education   Comments Counselor met with Randall. Long (Randall. Lemmie Long) today for initial psychosocial evaluation.  He is an 83 year old gentleman who has been married 53 years and has a strong support system with adult children and active involvement in his local church community.  Randall. Lemmie Long states he sleeps well (with occasional Tylenol at bedtime) and he has a good appetite.  He denies a history or current symptoms of depression or anxiety and states he is typically in a positive mood.  He has minimal stress in his life although he "worries" some about the health of his wife as she "works too hard taking care of me."  Randall. Lemmie Long has goals to increase his stamin and strength and to breathe better while in this program.        Psychosocial Re-Evaluation:     Psychosocial Re-Evaluation    Row Name 12/08/15 1231 12/28/15 1623 03/16/16 1543         Psychosocial Re-Evaluation   Comments See 30 note 12/08/15 [name] psychosocial assessment reveals no barriers at this time to participation in Pulmonary Rehab.  He  has good family and friend support that encourages [name] to participate in [place] and progress with His goals.  [place] concerns are monitored, but he  has acknowledge that attending the program has helped to maintain quality life with improved mobility, self-care, and emotional and financial stability.  Madsen is commended for regular attendance and self-motivation to improve His pulmonary disease management. Having been off from [place] for almost 2 months, [name] returned recently and is doing well. His exercise goals have been modified, but his home therapy and prepared him to return to [facility]. His wife is very supportive and helps him with many tasks. 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 He has a very positive attitude and is commended for his participation in [place].        Psychosocial Discharge  (Final Psychosocial Re-Evaluation):     Psychosocial Re-Evaluation - 03/16/16 1543      Psychosocial Re-Evaluation   Comments Having been off from [place] for almost 2 months, Randall [facility] returned recently and is doing well. His exercise goals have been modified, but his home therapy and prepared him to return to [facility]. His wife is very supportive and helps him with many tasks. 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 He has a very positive attitude and is commended for his participation in [place].      Education: Education Goals: Education classes will be provided on a weekly basis, covering required topics. Participant will state understanding/return demonstration of topics presented.  Learning Barriers/Preferences:     Learning Barriers/Preferences - 10/27/15 1434      Learning Barriers/Preferences   Learning Barriers None   Learning Preferences None      Education Topics: Initial Evaluation Education: - Verbal, written and demonstration of respiratory meds, RPE/PD scales, oximetry and breathing techniques. Instruction on use of nebulizers and MDIs: cleaning and proper use, rinsing mouth with steroid doses and importance of monitoring MDI activations. Flowsheet Row Pulmonary Rehab from 03/21/2016 in [facility] Cardiac and Pulmonary Rehab  Date  10/27/15  Educator  LB  Instruction Review Code  2- meets goals/outcomes      General Nutrition Guidelines/Fats and Fiber: -Group instruction provided by verbal, written material, models and posters to present the general guidelines for heart healthy nutrition. Gives an explanation and review of dietary fats and fiber. Flowsheet Row Pulmonary Rehab from 03/21/2016 in [facility] Cardiac and Pulmonary Rehab  Date  03/21/16  Educator  CR  Instruction Review Code  2- meets goals/outcomes      Controlling Sodium/Reading Food Labels: -Group verbal and written material supporting the discussion of sodium use  in heart healthy nutrition. Review and explanation with models, verbal and written materials for utilization of the food label. Flowsheet Row Pulmonary Rehab from 03/21/2016 in [facility] Cardiac and Pulmonary Rehab  Date  12/14/15  Educator  CR  Instruction Review Code  2- meets goals/outcomes      Exercise Physiology & Risk Factors: - Group verbal and written instruction with models to review the exercise physiology of the cardiovascular system and associated critical values. Details cardiovascular disease risk factors and the goals associated with each risk factor. Flowsheet Row Pulmonary Rehab from 03/21/2016 in [facility] Cardiac and Pulmonary Rehab  Date  12/02/15  Educator  AS  Instruction Review Code  2- meets goals/outcomes      Aerobic Exercise & Resistance Training: - Gives group verbal and written discussion on the health impact of inactivity. On the components of aerobic and resistive training programs and the benefits of this training and how to safely progress through these programs.   Flexibility, Balance, General Exercise Guidelines: - Provides group verbal and written instruction on the benefits of flexibility and balance training programs. Provides general exercise guidelines with specific guidelines to those with heart or lung disease. Demonstration and skill practice provided.   Stress Management: - Provides group verbal and written instruction about the health risks of elevated stress, cause of high stress, and healthy ways to reduce stress.   Depression: - Provides group verbal and written instruction on the correlation between heart/lung disease  and depressed mood, treatment options, and the stigmas associated with seeking treatment. Flowsheet Row Pulmonary Rehab from 03/21/2016 in [facility] Cardiac and Pulmonary Rehab  Date  12/21/15  Educator  [facility]  Instruction Review Code  2- meets goals/outcomes      Exercise & Equipment Safety: - Individual verbal instruction and  demonstration of equipment use and safety with use of the equipment.   Infection Prevention: - Provides verbal and written material to individual with discussion of infection control including proper hand washing and proper equipment cleaning during exercise session. Flowsheet Row Pulmonary Rehab from 03/21/2016 in [facility] Cardiac and Pulmonary Rehab  Date  10/27/15  Educator  LB  Instruction Review Code  2- meets goals/outcomes      Falls Prevention: - Provides verbal and written material to individual with discussion of falls prevention and safety. Flowsheet Row Pulmonary Rehab from 03/21/2016 in [facility] Cardiac and Pulmonary Rehab  Date  10/27/15  Educator  LB  Instruction Review Code  2- meets goals/outcomes      Diabetes: - Individual verbal and written instruction to review signs/symptoms of diabetes, desired ranges of glucose level fasting, after meals and with exercise. Advice that pre and post exercise glucose checks will be done for 3 sessions at entry of program.   Chronic Lung Diseases: - Group verbal and written instruction to review new updates, new respiratory medications, new advancements in procedures and treatments. Provide informative websites and "800" numbers of self-education. Flowsheet Row Pulmonary Rehab from 03/21/2016 in [facility] Cardiac and Pulmonary Rehab  Date  12/16/15  Educator  LB  Instruction Review Code  2- meets goals/outcomes      Lung Procedures: - Group verbal and written instruction to describe testing methods done to diagnose lung disease. Review the outcome of test results. Describe the treatment choices: Pulmonary Function Tests, ABGs and oximetry.   Energy Conservation: - Provide group verbal and written instruction for methods to conserve energy, plan and organize activities. Instruct on pacing techniques, use of adaptive equipment and posture/positioning to relieve shortness of breath. Flowsheet Row Pulmonary Rehab from 03/21/2016 in [facility] Cardiac and  Pulmonary Rehab  Date  03/16/16  Educator  [facility]  Instruction Review Code  2- meets goals/outcomes      Triggers: - Group verbal and written instruction to review types of environmental controls: home humidity, furnaces, filters, dust mite/pet prevention, HEPA vacuums. To discuss weather changes, air quality and the benefits of nasal washing.   Exacerbations: - Group verbal and written instruction to provide: warning signs, infection symptoms, calling MD promptly, preventive modes, and value of vaccinations. Review: effective airway clearance, coughing and/or vibration techniques. Create an Sports administrator. Flowsheet Row Pulmonary Rehab from 03/21/2016 in [facility] Cardiac and Pulmonary Rehab  Date  12/23/15  Educator  LB  Instruction Review Code  2- meets goals/outcomes      Oxygen: - Individual and group verbal and written instruction on oxygen therapy. Includes supplement oxygen, available portable oxygen systems, continuous and intermittent flow rates, oxygen safety, concentrators, and Medicare reimbursement for oxygen.   Respiratory Medications: - Group verbal and written instruction to review medications for lung disease. Drug class, frequency, complications, importance of spacers, rinsing mouth after steroid MDI's, and proper cleaning methods for nebulizers. Flowsheet Row Pulmonary Rehab from 03/21/2016 in [facility] Cardiac and Pulmonary Rehab  Date  10/27/15  Educator  LB  Instruction Review Code  2- meets goals/outcomes      AED/CPR: - Group verbal and written instruction with the use of models to  demonstrate the basic use of the AED with the basic ABC's of resuscitation.   Breathing Retraining: - Provides individuals verbal and written instruction on purpose, frequency, and proper technique of diaphragmatic breathing and pursed-lipped breathing. Applies individual practice skills. Flowsheet Row Pulmonary Rehab from 03/21/2016 in [facility] Cardiac and Pulmonary Rehab  Date  10/27/15  Educator   LB  Instruction Review Code  2- meets goals/outcomes      Anatomy and Physiology of the Lungs: - Group verbal and written instruction with the use of models to provide basic lung anatomy and physiology related to function, structure and complications of lung disease.   Heart Failure: - Group verbal and written instruction on the basics of heart failure: signs/symptoms, treatments, explanation of ejection fraction, enlarged heart and cardiomyopathy.   Sleep Apnea: - Individual verbal and written instruction to review Obstructive Sleep Apnea. Review of risk factors, methods for diagnosing and types of masks and machines for OSA.   Anxiety: - Provides group, verbal and written instruction on the correlation between heart/lung disease and anxiety, treatment options, and management of anxiety.   Relaxation: - Provides group, verbal and written instruction about the benefits of relaxation for patients with heart/lung disease. Also provides patients with examples of relaxation techniques. Flowsheet Row Pulmonary Rehab from 03/21/2016 in [facility] Cardiac and Pulmonary Rehab  Date  11/18/15  Educator  [facility]  Instruction Review Code  2- Meets goals/outcomes      Knowledge Questionnaire Score:     Knowledge Questionnaire Score - 10/27/15 1434      Knowledge Questionnaire Score   Pre Score 4/10       Core Components/Risk Factors/Patient Goals at Admission:     Personal Goals and Risk Factors at Admission - 10/27/15 1440      Core Components/Risk Factors/Patient Goals on Admission   Sedentary Yes  Walks driveway at home 5 days/week for 67mns   Intervention Provide advice, education, support and counseling about physical activity/exercise needs.;Develop an individualized exercise prescription for aerobic and resistive training based on initial evaluation findings, risk stratification, comorbidities and participant's personal goals.   Expected Outcomes Achievement of increased  cardiorespiratory fitness and enhanced flexibility, muscular endurance and strength shown through measurements of functional capacity and personal statement of participant.   Increase Strength and Stamina Yes   Intervention Provide advice, education, support and counseling about physical activity/exercise needs.;Develop an individualized exercise prescription for aerobic and resistive training based on initial evaluation findings, risk stratification, comorbidities and participant's personal goals.   Expected Outcomes Achievement of increased cardiorespiratory fitness and enhanced flexibility, muscular endurance and strength shown through measurements of functional capacity and personal statement of participant.   Improve shortness of breath with ADL's Yes   Intervention Provide education, individualized exercise plan and daily activity instruction to help decrease symptoms of SOB with activities of daily living.   Expected Outcomes Short Term: Achieves a reduction of symptoms when performing activities of daily living.   Develop more efficient breathing techniques such as purse lipped breathing and diaphragmatic breathing; and practicing self-pacing with activity Yes  Uses PLB; [facility]   Intervention Provide education, demonstration and support about specific breathing techniuqes utilized for more efficient breathing. Include techniques such as pursed lipped breathing, diaphragmatic breathing and self-pacing activity.   Expected Outcomes Short Term: Participant will be able to demonstrate and use breathing techniques as needed throughout daily activities.   Increase knowledge of respiratory medications and ability to use respiratory devices properly  Yes  Albuterol MDI; Spacer given; oxygen 2-5l/m   Intervention Provide education and demonstration as needed of appropriate use of medications, inhalers, and oxygen therapy.   Expected Outcomes Short Term: Achieves  understanding of medications use. Understands that oxygen is a medication prescribed by physician. Demonstrates appropriate use of inhaler and oxygen therapy.   Lipids Yes   Intervention Provide education and support for participant on nutrition & aerobic/resistive exercise along with prescribed medications to achieve LDL <95m, HDL >428m   Expected Outcomes Short Term: Participant states understanding of desired cholesterol values and is compliant with medications prescribed. Participant is following exercise prescription and nutrition guidelines.;Long Term: Cholesterol controlled with medications as prescribed, with individualized exercise RX and with personalized nutrition plan. Value goals: LDL < 7064mHDL > 40 mg.      Core Components/Risk Factors/Patient Goals Review:      Goals and Risk Factor Review    Row Name 11/23/15 1439 12/08/15 0640 12/28/15 1614 03/16/16 1537       Core Components/Risk Factors/Patient Goals Review   Personal Goals Review Sedentary;Increase Strength and Stamina;Improve shortness of breath with ADL's;Develop more efficient breathing techniques such as purse lipped breathing and diaphragmatic breathing and practicing self-pacing with activity.;Increase knowledge of respiratory medications and ability to use respiratory devices properly. Sedentary;Increase Strength and Stamina;Improve shortness of breath with ADL's;Increase knowledge of respiratory medications and ability to use respiratory devices properly. Sedentary;Increase Strength and Stamina;Improve shortness of breath with ADL's;Increase knowledge of respiratory medications and ability to use respiratory devices properly.;Develop more efficient breathing techniques such as purse lipped breathing and diaphragmatic breathing and practicing self-pacing with activity.;Lipids Improve shortness of breath with ADL's;Increase knowledge of respiratory medications and ability to use respiratory devices properly.;Develop more  efficient breathing techniques such as purse lipped breathing and diaphragmatic breathing and practicing self-pacing with activity.    Review [name] completed 3 weeks in [facility] and states an improvement with his strength. He has recently started on Mucinex which has improved his sputum production. He has a good understanding of his Albuterol MDI and carries it with him. The spacer he uses at home. His PD's for shortness of breath are 3-4 and does not notice improvement in his breathing, although he does use PLB and adjusts his oxygen to 5l/m at home with activity. [name] walks at home his driveway with pacing and use of his home oximeter. Unfortunately he uses intermittent flow with the oxygen, so  his O2Sat's do drop in the 80's - encouraged him to rest and pace himself to keep O2Sat's 88% or greater. [name] still walking his driveway on [place] days [place] [facility] with the [place], [facility] new sneakers for better support for exercise. His breathing has worsened over the last couple of weeks. He physician has ordered an EKG.  [name] had a productive cough, but the sputum has been clear. He does use a flutter valve.  [name] progressed his goals on the NS, TM, and AE. He adjusts his oxygen from 4-6l/m with his exercise.  Recently he bought a new pair of sneakers for better support during his exercise. He is using his Albuterol MDI with the aerochamber. His physician has ordered a vibrating vest for mobilization of his secretions. [name] is compliant with his lipid medication. [name] due to a stroke. Through home therapy, he has improved his stamina and endurance  and returned to [place] with only a few modifications to his exercise goals. He occasionally uses his albuterol with his spacer and uses his vibrating vest . He has good PLB  technique and adjusts to oxygen to 4-6l/m. He is also compliant with his other medication, including his Prilosec for his lipids.    Expected Outcomes  - Continue exercising to increase his strength and help improve shortness of breath. Contact oxygen company for a larger portable tank. Continue to manage his bronchiectasis with the vibrating vest and oxygen.  -       Core Components/Risk Factors/Patient Goals at Discharge (Final Review):      Goals and Risk Factor Review - 03/16/16 1537      Core Components/Risk Factors/Patient Goals Review   Personal Goals Review Improve shortness of breath with ADL's;Increase knowledge of respiratory medications and ability to use respiratory devices properly.;Develop more efficient breathing techniques such as purse lipped breathing and diaphragmatic breathing and practicing self-pacing with activity.   Review [name] has returned to [place] after last attending 12/31/16 due to a stroke. Through home therapy, he has improved his stamina and endurance and returned to [place] with only a few modifications to his exercise goals. He occasionally uses his albuterol with his spacer and uses his vibrating vest . He has good PLB technique and adjusts to oxygen to 4-6l/m. He is also compliant with his other medication, including his Prilosec for his lipids.      ITP Comments:     ITP Comments    Row Name 01/04/16 1609 01/05/16 1532 01/20/16 1450 02/23/16 1325 03/14/16 1534   ITP Comments Ms Wheeler called today. [name] is being tested for a possible stroke on 01/03/16. He will return as soon as possible to [place]. 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  He was discharged today 01/05/16 with HHPT and possible nuero rehab after that.  We will follow with them next week to determine plan of action for pulmonary rehab. Called [name] to check how he was doing after his strike. He states he is having home therapy and has improved to "almost  normal". The chest vest therapy has been helpful which he performs twice daily. He will inform us about his progress and  return to [place]. Called [name] - last attended 12/31/16 due to a stroke. He had his last session of PT last week. He has a procedure coming up,  and the weather has kept him in the house. His wife will call back with more information on his return to [place]. Randall Sundby was cleared for return to [facility] by Dr Lake Bells.       Comments: 30 Day Review

## 2016-04-02 DIAGNOSIS — J449 Chronic obstructive pulmonary disease, unspecified: Secondary | ICD-10-CM | POA: Diagnosis not present

## 2016-04-04 DIAGNOSIS — J9611 Chronic respiratory failure with hypoxia: Secondary | ICD-10-CM

## 2016-04-04 DIAGNOSIS — J439 Emphysema, unspecified: Secondary | ICD-10-CM | POA: Diagnosis not present

## 2016-04-04 DIAGNOSIS — J479 Bronchiectasis, uncomplicated: Secondary | ICD-10-CM

## 2016-04-04 DIAGNOSIS — J449 Chronic obstructive pulmonary disease, unspecified: Secondary | ICD-10-CM

## 2016-04-04 NOTE — Progress Notes (Signed)
Daily Session Note  Patient Details  Name: [name] MRN: [id number] Date of Birth: November 04, 1928 Referring Provider:     Pulmonary Rehab from 10/27/2015 in [facility] Cardiac and Pulmonary Rehab  Referring Provider  Simonne Maffucci MD      Encounter Date: 04/04/2016  Check In:     Session Check In - 04/04/16 1244      Check-In   Location [facility]-Cardiac & Pulmonary Rehab   Staff Present Nada Maclachlan, BA, ACSM CEP, Exercise Physiologist;Kelly Amedeo Plenty, BS, ACSM CEP, Exercise Physiologist;Laureen Owens Shark, BS, RRT, Respiratory Therapist         History  Smoking Status  . Former Smoker  . Packs/day: 1.00  . Years: 50.00  . Types: Cigarettes  . Quit date: 04/14/1999  Smokeless Tobacco  . Never Used    Goals Met:  Proper associated with RPD/PD & O2 Sat Independence with exercise equipment Exercise tolerated well Strength training completed today  Goals Unmet:  Not Applicable  Comments: Pt able to follow exercise prescription today without complaint.  Will continue to monitor for progression.    Dr. Emily Filbert is Medical Director for [place] and [facility] Pulmonary Rehabilitation.

## 2016-04-05 NOTE — Progress Notes (Signed)
Elven psychosocial assessment reveals no barriers at this time to participation in Pulmonary Rehab.  He  has good family and friend support that encourages [name] to participate in [place] and progress with His goals.  [name] concerns are monitored, but he  has acknowledge that attending the program has helped to maintain quality life with improved mobility, self-care, and emotional and financial stability.  [name] is commended for regular attendance and self-motivation to improve His pulmonary disease management.

## 2016-04-07 ENCOUNTER — Ambulatory Visit: Payer: Medicare Other | Admitting: Nurse Practitioner

## 2016-04-11 DIAGNOSIS — J479 Bronchiectasis, uncomplicated: Secondary | ICD-10-CM

## 2016-04-11 DIAGNOSIS — J9611 Chronic respiratory failure with hypoxia: Secondary | ICD-10-CM

## 2016-04-11 DIAGNOSIS — J439 Emphysema, unspecified: Secondary | ICD-10-CM | POA: Diagnosis not present

## 2016-04-11 DIAGNOSIS — J449 Chronic obstructive pulmonary disease, unspecified: Secondary | ICD-10-CM

## 2016-04-11 NOTE — Progress Notes (Signed)
Daily Session Note  Patient Details  Name: [name] MRN: [id number] Date of Birth: 07-31-1928 Referring Provider:     Pulmonary Rehab from 10/27/2015 in [facility] Cardiac and Pulmonary Rehab  Referring Provider  Simonne Maffucci MD      Encounter Date: 04/11/2016  Check In:     Session Check In - 04/11/16 1436      Check-In   Location [facility]-Cardiac & Pulmonary Rehab   Staff Present Carson Myrtle, BS, RRT, Respiratory Therapist;Kelly Amedeo Plenty, BS, ACSM CEP, Exercise Physiologist;Amanda Oletta Darter, BA, ACSM CEP, Exercise Physiologist   Supervising physician immediately available to respond to emergencies [facility] immediately available ER MD   Physician(s) Clearnce Hasten and Jimmye Izaac   Medication changes reported     No   Fall or balance concerns reported    No   Warm-up and Cool-down Performed as group-led instruction   Resistance Training Performed Yes   VAD Patient? No         History  Smoking Status  . Former Smoker  . Packs/day: 1.00  . Years: 50.00  . Types: Cigarettes  . Quit date: 04/14/1999  Smokeless Tobacco  . Never Used    Goals Met:  Proper associated with RPD/PD & O2 Sat Independence with exercise equipment Exercise tolerated well Strength training completed today  Goals Unmet:  Not Applicable  Comments: Pt able to follow exercise prescription today without complaint.  Will continue to monitor for progression.    Dr. Emily Filbert is Medical Director for [place] and [facility] Pulmonary Rehabilitation.

## 2016-04-13 ENCOUNTER — Telehealth: Payer: Self-pay | Admitting: Family Medicine

## 2016-04-13 NOTE — Telephone Encounter (Signed)
Patient Name: [name] DOB: 1928/07/06 Initial Comment Caller States her husband is feeling very weak. he has pulmonary problems. Did not feel strong enough to go to his pulmonary PT today. Nurse Assessment Nurse: Vallery Sa, RN, Cathy Date/Time (Eastern Time): 04/13/2016 11:15:21 AM Confirm and document reason for call. If symptomatic, describe symptoms. ---[name] states that [name] developed sudden, increased weakness about 30 minutes ago. No sudden weakness on one side of his body or face. No chest pain. No severe breathing difficulty. His SpO2 is 98. Alert and responsive. Does the patient have any new or worsening symptoms? ---Yes Will a triage be completed? ---Yes Related visit to physician within the last 2 weeks? ---No Does the PT have any chronic conditions? (i.e. diabetes, asthma, etc.) ---Yes List chronic conditions. ---Lung problems Is this a behavioral health or substance abuse call? ---No Guidelines Guideline Title Affirmed Question Affirmed Notes Weakness (Generalized) and Fatigue Patient sounds very sick or weak to the triager Final Disposition User Go to ED Now (or PCP triage) Vallery Sa, RN, [name] - ED Disagree/Comply: [name] shares [name] is very weak and has had spontaneous collapse of his lungs in the past. She plans to take him to the ER now.

## 2016-04-13 NOTE — Telephone Encounter (Signed)
Agree with ER.  Thanks.

## 2016-04-13 NOTE — Telephone Encounter (Signed)
Spoke to patient and was advised that he got up this morning and was weak. Patient stated that he feels okay now.  Patient stated that he did not feel bad enough to go to the ER. Offered patient an appointment tomorrow which he declined stating that he will see how he feels tomorrow and will call back tomorrow for an appointment if he feels that he needs to be seen.

## 2016-04-14 DIAGNOSIS — G629 Polyneuropathy, unspecified: Secondary | ICD-10-CM | POA: Diagnosis not present

## 2016-04-14 NOTE — Telephone Encounter (Signed)
Noted. Thanks.

## 2016-04-18 ENCOUNTER — Encounter: Payer: Medicare Other | Attending: Pulmonary Disease

## 2016-04-18 DIAGNOSIS — J439 Emphysema, unspecified: Secondary | ICD-10-CM | POA: Insufficient documentation

## 2016-04-18 DIAGNOSIS — J9611 Chronic respiratory failure with hypoxia: Secondary | ICD-10-CM | POA: Insufficient documentation

## 2016-04-20 ENCOUNTER — Encounter: Payer: Medicare Other | Admitting: *Deleted

## 2016-04-20 ENCOUNTER — Other Ambulatory Visit: Payer: Self-pay | Admitting: Family Medicine

## 2016-04-20 DIAGNOSIS — J449 Chronic obstructive pulmonary disease, unspecified: Secondary | ICD-10-CM

## 2016-04-20 DIAGNOSIS — J439 Emphysema, unspecified: Secondary | ICD-10-CM | POA: Diagnosis not present

## 2016-04-20 DIAGNOSIS — J479 Bronchiectasis, uncomplicated: Secondary | ICD-10-CM

## 2016-04-20 DIAGNOSIS — J9611 Chronic respiratory failure with hypoxia: Secondary | ICD-10-CM

## 2016-04-20 NOTE — Telephone Encounter (Signed)
Received refill request electronically Last refill 10/22/15  # 60/ 2 Last office visit 03/10/16 acute

## 2016-04-20 NOTE — Progress Notes (Signed)
Daily Session Note  Patient Details  Name: [name] MRN: [id number] Date of Birth: 1928-08-25 Referring Provider:     Pulmonary Rehab from 10/27/2015 in [facility] Cardiac and Pulmonary Rehab  Referring Provider  Simonne Maffucci MD      Encounter Date: 04/20/2016  Check In:     Session Check In - 04/20/16 1124      Check-In   Location [facility]-Cardiac & Pulmonary Rehab   Staff Present Alberteen Sam, MA, ACSM RCEP, Exercise Physiologist;Laureen Owens Shark, BS, RRT, Respiratory Therapist;Carroll Enterkin, RN, BSN   Supervising physician immediately available to respond to emergencies [facility] immediately available ER MD   Physician(s) Drs. Lord and [place]   Medication changes reported     No   Fall or balance concerns reported    No   Warm-up and Cool-down Performed as group-led Location manager Performed Yes   VAD Patient? No     Pain Assessment   Currently in Pain? No/denies   Multiple Pain Sites No         History  Smoking Status  . Former Smoker  . Packs/day: 1.00  . Years: 50.00  . Types: Cigarettes  . Quit date: 04/14/1999  Smokeless Tobacco  . Never Used    Goals Met:  Independence with exercise equipment Using PLB without cueing & demonstrates good technique Exercise tolerated well Strength training completed today  Goals Unmet:  Not Applicable  Comments: Pt able to follow exercise prescription today without complaint.  Will continue to monitor for progression.    Dr. Emily Filbert is Medical Director for [place] and [facility] Pulmonary Rehabilitation.

## 2016-04-20 NOTE — Telephone Encounter (Signed)
Please call in.  Thanks.

## 2016-04-21 NOTE — Telephone Encounter (Signed)
Rx called in to requested pharmacy

## 2016-04-25 ENCOUNTER — Encounter: Payer: Self-pay | Admitting: Respiratory Therapy

## 2016-04-25 DIAGNOSIS — J449 Chronic obstructive pulmonary disease, unspecified: Secondary | ICD-10-CM

## 2016-04-25 DIAGNOSIS — J9611 Chronic respiratory failure with hypoxia: Secondary | ICD-10-CM

## 2016-04-25 DIAGNOSIS — J479 Bronchiectasis, uncomplicated: Secondary | ICD-10-CM

## 2016-04-25 DIAGNOSIS — J439 Emphysema, unspecified: Secondary | ICD-10-CM | POA: Diagnosis not present

## 2016-04-25 NOTE — Progress Notes (Signed)
Daily Session Note  Patient Details  Name: [name] MRN: [id number] Date of Birth: 09/16/1928 Referring Provider:     Pulmonary Rehab from 10/27/2015 in [facility]  Referring Provider  Simonne Maffucci MD      Encounter Date: 04/25/2016  Check In:     Session Check In - 04/25/16 1404      Check-In   Location [facility]   Staff Present Nada Maclachlan, BA, ACSM CEP, Exercise Physiologist;Laureen Owens Shark, BS, RRT, Respiratory Bertis Ruddy, BS, ACSM CEP, Exercise Physiologist   Supervising physician immediately available to respond to emergencies [facility] immediately available ER MD   Physician(s) Corky Downs and Jimmye Ranell   Medication changes reported     No   Fall or balance concerns reported    No   Warm-up and Cool-down Performed as group-led Location manager Performed Yes   VAD Patient? No     Pain Assessment   Currently in Pain? No/denies         History  Smoking Status  . Former Smoker  . Packs/day: 1.00  . Years: 50.00  . Types: Cigarettes  . Quit date: 04/14/1999  Smokeless Tobacco  . Never Used    Goals Met:  Proper associated with RPD/PD & O2 Sat Independence with exercise equipment Exercise tolerated well Strength training completed today  Goals Unmet:  Not Applicable  Comments: Pt able to follow exercise prescription today without complaint.  Will continue to monitor for progression.    Dr. Emily Filbert is Medical Director for [place] and [facility].

## 2016-04-25 NOTE — Progress Notes (Signed)
Pulmonary Individual Treatment Plan  Patient Details  Name: [name] MRN: [id number] Date of Birth: 01/07/1929 Referring Provider:     Pulmonary Rehab from 10/27/2015 in [facility] Cardiac and Pulmonary Rehab  Referring Provider  Simonne Maffucci MD      Initial Encounter Date:    Pulmonary Rehab from 10/27/2015 in [facility] Cardiac and Pulmonary Rehab  Date  10/27/15  Referring Provider  Simonne Maffucci MD      Visit Diagnosis: No diagnosis found.  Patient's Home Medications on Admission:  Current Outpatient Prescriptions:    acetaminophen (TYLENOL) 325 MG tablet, Take 650 mg by mouth every 6 (six) hours as needed for mild pain. , Disp: , Rfl:    albuterol (PROAIR HFA) 108 (90 Base) MCG/ACT inhaler, Inhale 2 puffs into the lungs every 4 (four) hours as needed for wheezing or shortness of breath., Disp: 1 Inhaler, Rfl: 2   Cyanocobalamin (VITAMIN B 12 PO), Take by mouth daily., Disp: , Rfl:    FIBER PO, Take 1 capsule by mouth 2 (two) times daily., Disp: , Rfl:    fluticasone (FLONASE) 50 MCG/ACT nasal spray, Place 2 sprays into both nostrils daily. (Patient taking differently: Place 2 sprays into both nostrils daily as needed for allergies. ), Disp: , Rfl:    metoprolol tartrate (LOPRESSOR) 25 MG tablet, Take 0.5 tablets (12.5 mg total) by mouth as needed (for palpitations)., Disp: , Rfl:    Multiple Vitamin (MULTIVITAMIN) tablet, Take 1 tablet by mouth daily. Reported on 04/22/2015, Disp: , Rfl:    multivitamin-lutein (OCUVITE-LUTEIN) CAPS capsule, Take 1 capsule by mouth daily., Disp: , Rfl:    NON FORMULARY, Oxygen 2-5 liters 24/7, Disp: , Rfl:    omeprazole (PRILOSEC) 20 MG capsule, Take 20 mg by mouth daily.  , Disp: , Rfl:    pravastatin (PRAVACHOL) 40 MG tablet, Take 0.5 tablets (20 mg total) by mouth 2 (two) times a week., Disp: 30 tablet, Rfl: 0   Respiratory Therapy Supplies (FLUTTER) DEVI, Use as directed, Disp: 1 each, Rfl: 0   rivaroxaban (XARELTO) 20 MG TABS  tablet, Take 1 tablet (20 mg total) by mouth daily with supper., Disp: 90 tablet, Rfl: 1   tamsulosin (FLOMAX) 0.4 MG CAPS capsule, Take 2 capsules (0.8 mg total) by mouth daily., Disp: 180 capsule, Rfl: 3   tiotropium (SPIRIVA) 18 MCG inhalation capsule, Place 1 capsule (18 mcg total) into inhaler and inhale daily., Disp: 30 capsule, Rfl: 6   traMADol (ULTRAM) 50 MG tablet, TAKE 1 TABLET BY MOUTH EVERY 12 HOURS AS NEEDED FOR PAIN, Disp: 60 tablet, Rfl: 2   traZODone (DESYREL) 100 MG tablet, TAKE 2 TABLETS (200 MG TOTAL) BY MOUTH AT BEDTIME., Disp: 180 tablet, Rfl: 3  Past Medical History: Past Medical History:  Diagnosis Date   AF (atrial fibrillation) (HCC)    after PTX 2012   Allergy    Arthritis    h/o R shoulder injection per ortho   Back pain    BPH (benign prostatic hyperplasia)    COPD (chronic obstructive pulmonary disease) (HCC)    Coronary artery disease    Elevated MCV    Erectile dysfunction    GERD (gastroesophageal reflux disease)    HSV-1 (herpes simplex virus 1) infection    Hyperlipidemia    Spontaneous pneumothorax 09/04/2010   [name]    Tobacco Use: History  Smoking Status   Former Smoker   Packs/day: 1.00   Years: 50.00   Types: Cigarettes   Quit date: 04/14/1999  Smokeless Tobacco   Never Used    Labs: Recent Review Flowsheet Data    Labs for ITP Cardiac and Pulmonary Rehab Latest Ref Rng & Units 05/19/2014 09/15/2014 11/26/2015 01/03/2016 01/04/2016   Cholestrol 0 - 200 mg/dL 188 - 199 - 192   LDLCALC 0 - 99 mg/dL 132(H) - 124(H) - 138(H)   LDLDIRECT mg/dL - - - - -   HDL >40 mg/dL 31.50(L) - 40.60 - 36(L)   Trlycerides <150 mg/dL 124.0 - 172.0(H) - 90   Hemoglobin A1c 4.8 - 5.6 % - 5.7 5.4 - 5.4   PHART 7.350 - 7.450 - - - - -   PCO2ART 35.0 - 45.0 mmHg - - - - -   HCO3 20.0 - 24.0 mEq/L - - - - -   TCO2 0 - 100 mmol/L - - - 26 -   ACIDBASEDEF 0.0 - 2.0 mmol/L - - - - -   O2SAT % - - - - -       ADL UCSD:      Pulmonary Assessment Scores    Row Name 12/23/15 1317 12/30/15 0753       ADL UCSD   ADL Phase Mid Mid    SOB Score total 91  --    Rest 1  --    Walk 3  --    Stairs 5  --    Bath 4  --    Dress 4  --    Shop 4  --       Pulmonary Function Assessment:   Exercise Target Goals:    Exercise Program Goal: Individual exercise prescription set with THRR, safety & activity barriers. Participant demonstrates ability to understand and report RPE using BORG scale, to self-measure pulse accurately, and to acknowledge the importance of the exercise prescription.  Exercise Prescription Goal: Starting with aerobic activity 30 plus minutes a day, 3 days per week for initial exercise prescription. Provide home exercise prescription and guidelines that participant acknowledges understanding prior to discharge.  Activity Barriers & Risk Stratification:   6 Minute Walk:     6 Minute Walk    Row Name 12/23/15 1318         6 Minute Walk   Phase Mid Program     Distance 460 feet     Walk Time 4.27 minutes     # of Rest Breaks 5  20 sec, 10 sec, 24 sec, 20 sec, 30 sec     MPH 1.22     METS 1.9     RPE 13     Perceived Dyspnea  4     VO2 Peak 3.36     Symptoms Yes (comment)     Comments SOB     Resting HR 88 bpm     Resting BP 126/70     Max Ex. HR 113 bpm     Max Ex. BP 134/66     2 Minute Post BP 126/64       Interval HR   Baseline HR 88     1 Minute HR 115     2 Minute HR 107     3 Minute HR 112     4 Minute HR 88     5 Minute HR 106     6 Minute HR 106     2 Minute Post HR 75     Interval Heart Rate? Yes       Interval Oxygen   Interval Oxygen? Yes  Baseline Oxygen Saturation % 93 %     Baseline Liters of Oxygen 5 L  pulsed     1 Minute Oxygen Saturation % 85 %  84% 1:03-1:23     1 Minute Liters of Oxygen 6 L  continuous     2 Minute Oxygen Saturation % 89 %  rest 2:18-228, 84% 2:51-3:15     2 Minute Liters of Oxygen 6 L     3 Minute Oxygen  Saturation % 85 %     3 Minute Liters of Oxygen 8 L     4 Minute Oxygen Saturation % 92 %  82% 3:38-3:58     4 Minute Liters of Oxygen 8 L     5 Minute Oxygen Saturation % 84 %  84% 5:00-5:30     5 Minute Liters of Oxygen 8 L     6 Minute Oxygen Saturation % 93 %     6 Minute Liters of Oxygen 8 L     2 Minute Post Oxygen Saturation % 92 %     2 Minute Post Liters of Oxygen 8 L       Oxygen Initial Assessment:     Oxygen Initial Assessment - 03/28/16 0756      Home Oxygen   Home Oxygen Device Portable Concentrator;Home Concentrator   Sleep Oxygen Prescription Continuous   Liters per minute --  2-5 L/min   Home Exercise Oxygen Prescription Continuous   Liters per minute --  2-5 L/min   Home at Rest Exercise Oxygen Prescription Continuous   Liters per minute --  2-5 L/min   Compliance with Home Oxygen Use Yes     Initial 6 min Walk   Oxygen Used Continuous   Liters per minute 5   Resting Oxygen Saturation  during 6 min walk 93 %   Exercise Oxygen Saturation  during 6 min walk 81 %     Program Oxygen Prescription   Program Oxygen Prescription Continuous   Liters per minute --  4-6 L/min     Intervention   Short Term Goals To learn and exhibit compliance with exercise, home and travel O2 prescription;To learn and understand importance of monitoring SPO2 with pulse oximeter and demonstrate accurate use of the pulse oximeter.;To Learn and understand importance of maintaining oxygen saturations>88%;To learn and demonstrate proper use of respiratory medications;To learn and demonstrate proper purse lipped breathing techniques or other breathing techniques.   Long  Term Goals Exhibits compliance with exercise, home and travel O2 prescription;Maintenance of O2 saturations>88%;Compliance with respiratory medication;Verbalizes importance of monitoring SPO2 with pulse oximeter and return demonstration;Exhibits proper breathing techniques, such as purse lipped breathing or other  method taught during program session;Demonstrates proper use of MDIs      Oxygen Re-Evaluation:     Oxygen Re-Evaluation    Row Name 04/05/16 0614             Program Oxygen Prescription   Program Oxygen Prescription Continuous;E-Tanks       Liters per minute 6         Home Oxygen   Home Oxygen Device Home Concentrator;E-Tanks       Sleep Oxygen Prescription Continuous       Liters per minute --  3-6       Home Exercise Oxygen Prescription Continuous       Liters per minute --  3-6       Home at Rest Exercise Oxygen Prescription Continuous       Liters per  minute --  3-6       Compliance with Home Oxygen Use Yes         Goals/Expected Outcomes   Comments [name] continues to have shortness of breath and self- adjusts his oxygen with rest and then increases the oxygen with activities. He admits he has panic attacks and was encouraged to stop the activity and PLB. He still wants to ride mow his yard, but does have to change out his oxygen half way thru the job. Randall [facility] appreciates the oxygen and knows that it enables him to continue walking and other activities.       Goals/Expected Outcomes Continue self-adjusting oxygen flows with activity and use PLB for activity and panic attacks.          Oxygen Discharge (Final Oxygen Re-Evaluation):     Oxygen Re-Evaluation - 04/05/16 0614      Program Oxygen Prescription   Program Oxygen Prescription Continuous;E-Tanks   Liters per minute 6     Home Oxygen   Home Oxygen Device Home Concentrator;E-Tanks   Sleep Oxygen Prescription Continuous   Liters per minute --  3-6   Home Exercise Oxygen Prescription Continuous   Liters per minute --  3-6   Home at Rest Exercise Oxygen Prescription Continuous   Liters per minute --  3-6   Compliance with Home Oxygen Use Yes     Goals/Expected Outcomes   Comments [name] continues to have shortness of breath and self- adjusts his oxygen with rest and then increases  the oxygen with activities. He admits he has panic attacks and was encouraged to stop the activity and PLB. He still wants to ride mow his yard, but does have to change out his oxygen half way thru the job. Randall [facility] appreciates the oxygen and knows that it enables him to continue walking and other activities.   Goals/Expected Outcomes Continue self-adjusting oxygen flows with activity and use PLB for activity and panic attacks.      Initial Exercise Prescription:   Perform Capillary Blood Glucose checks as needed.  Exercise Prescription Changes:     Exercise Prescription Changes    Row Name 11/02/15 1500 11/11/15 1500 11/25/15 1500 12/08/15 1500 12/22/15 1500     Response to Exercise   Blood Pressure (Admit) 120/70 110/62 138/68 152/80 142/70   Blood Pressure (Exercise) 138/82 138/70 128/82 150/76 136/78   Blood Pressure (Exit) 132/70 126/64 138/80 150/82 134/82   Heart Rate (Admit) 79 bpm 78 bpm 76 bpm 70 bpm 86 bpm   Heart Rate (Exercise) 100 bpm 78 bpm 108 bpm 102 bpm 125 bpm   Heart Rate (Exit) 89 bpm 75 bpm 107 bpm 92 bpm 90 bpm   Oxygen Saturation (Admit) 92 % 92 % 92 % 94 % 91 %   Oxygen Saturation (Exercise) 88 % 94 % 89 % 90 % 91 %   Oxygen Saturation (Exit) 98 % 99 % 90 % 98 % 97 %   Rating of Perceived Exertion (Exercise) _0 Perceived Dyspnea (Exercise) _1 Symptoms _2    Duration Progress to 45 minutes of aerobic exercise without signs/symptoms of physical distress Progress to 45 minutes of aerobic exercise without signs/symptoms of physical distress Progress to 45 minutes of aerobic exercise without signs/symptoms of physical distress Progress to 45 minutes of aerobic exercise without signs/symptoms of physical distress Progress to 45 minutes of aerobic exercise without signs/symptoms of  physical distress   Intensity _0      Progression   Progression  Continue to progress workloads to maintain intensity without signs/symptoms of physical distress. Continue to progress workloads to maintain intensity without signs/symptoms of physical distress. Continue to progress workloads to maintain intensity without signs/symptoms of physical distress. Continue to progress workloads to maintain intensity without signs/symptoms of physical distress. Continue to progress workloads to maintain intensity without signs/symptoms of physical distress.   Average METs  -- 1.4 1.76 1.63 1.75     Resistance Training   Training Prescription _1    Weight 2 2 lbs 2 lbs 6 lbs 3 lbs   Reps 10-12 10-12 10-12 10-12 10-12     Interval Training   Interval Training _2      Oxygen   Oxygen _3    Liters  -- 4-6 4-6  6L on treadmill 4-6  6L on treadmill 4-6  6L on treadmill     Treadmill   MPH 0.8 0.8 1 0.8 0.8   Grade 0 0 0 0 0   Minutes 15  2/2/2/2/2 7  57mn, 5 min  _4 METs  -- 1.6 1.77 1.6 1.6     NuStep   Level _5 SPM  -- --  63 spm --  60 spm --  65 spm --  61 spm   Minutes _6 METs  -- 1.6 1.6 1.6 1.8     Arm Ergometer   Level _7 Watts  --  -- --  32 rpm --  45 rpm  --   Minutes 7.5 7._8 METs  -- 1 1.9 1.6 1.6     Exercise Review   Progression _9    Row Name 01/05/16 1500 03/16/16 1500 03/29/16 1600 04/13/16 1500       Response to Exercise   Blood Pressure (Admit) 106/62 128/64 130/62 124/72    Blood Pressure (Exercise) 130/80 128/66 140/80 148/74    Blood Pressure (Exit) 122/56 120/70 128/70 126/66    Heart Rate (Admit) 85 bpm 69 bpm 84 bpm 72 bpm    Heart Rate (Exercise) 98 bpm 92 bpm 105 bpm 107 bpm    Heart Rate (Exit) 90 bpm 75 bpm 80 bpm 100 bpm    Oxygen Saturation (Admit) 93 % 94 % 91 % 93 %    Oxygen Saturation (Exercise) 89 % 94 % 90 % 92 %    Oxygen Saturation (Exit) 96 % 98 % 98 % 92 %     Rating of Perceived Exertion (Exercise) _10 Perceived Dyspnea (Exercise) _11 Symptoms none none none none    Comments Home Exercise Guidelines given 12/18/15 Second day back from 2 month absence  --  --    Duration Progress to 45 minutes of aerobic exercise without signs/symptoms of physical distress Progress to 45 minutes of aerobic exercise without signs/symptoms of physical distress Progress to 45 minutes of aerobic exercise without signs/symptoms of physical distress Progress to 45 minutes of aerobic exercise without signs/symptoms of physical distress    Intensity THRR unchanged THRR unchanged THRR unchanged THRR unchanged      Progression   Progression Continue to progress workloads to maintain  intensity without signs/symptoms of physical distress. Continue to progress workloads to maintain intensity without signs/symptoms of physical distress. Continue to progress workloads to maintain intensity without signs/symptoms of physical distress. Continue to progress workloads to maintain intensity without signs/symptoms of physical distress.    Average METs 1.6 1.6 1.6 1.9      Resistance Training   Training Prescription Yes Yes Yes Yes    Weight 3 lbs 3 lbs 3 lbs 2 lbs    Reps 10-12 10-15 10-15 10-15      Interval Training   Interval Training No No No No      Oxygen   Oxygen Continuous Continuous Continuous Continuous    Liters 4-6  6L on treadmill 4-6  6L on treadmill 4-6  6L on treadmill 4-6  6L on treadmill      Treadmill   MPH 0.8 0.8 0.8 0.9    Grade 0 0 0 0    Minutes _0 METs 1.6 1.6 1.6 1.6      NuStep   Level _1 SPM --  88 spm  --  -- 68    Minutes _2 METs 1.5  --  -- 2      Biostep-RELP   Level  -- _3 Minutes  -- _4 METs  --  --  -- 2      Home Exercise Plan   Plans to continue exercise at [employer] [facility] [employer] (comment)  [facility], Home (walk/treadmill) [facility] (comment)   [facility], Home (walk/treadmill) [facility] (comment)  [facility], Home (walk/treadmill) [facility] (comment)  [facility], Home (walk/treadmill)    Frequency Add 2 additional days to program exercise sessions. Add 2 additional days to program exercise sessions. Add 2 additional days to program exercise sessions. Add 2 additional days to program exercise sessions.    Initial Home Exercises Provided  -- 12/18/15 12/18/15 12/18/15      Exercise Review   Progression Yes  --  --  --       Exercise Comments:     Exercise Comments    Row Name 11/04/15 1356 11/11/15 1536 11/25/15 1540 12/08/15 1551 12/22/15 1550   Exercise Comments First full day of exercise was on Monday!  Patient was oriented to gym and equipment including functions, settings, policies, and procedures.  Patient's individual exercise prescription and treatment plan were reviewed.  All starting workloads were established based on the results of the 6 minute walk test done at initial orientation visit.  The plan for exercise progression was also introduced and progression will be customized based on patient's performance and goals. Winfred is off to a good start with exercise.   He is doing 0.8 mph on treadmill for 2 min intervals for 8 min total!  We will continue to monitor his progression. Winfred continues to do well with exercise.  He is starting to become more independent.  We will hold off on home exercise just a little bit longer unitl he is ready.  We will continue to monitor his progression. Winfred is getting more independent with exercise.  He may be ready for home exercise soon.  We will continue to monitor his progression. Winfred is doing well with exercise.  We will continue to monitor for progression.   [place] Name 01/05/16 [phone]  Exercise Comments Winfred has been doing well in rehab.  He is up to 88 spm on the NuStep.  We will continue to monitor his progress.           Exercise Goals and Review:     Exercise Goals    Row Name 03/29/16 1639             Exercise Goals   Increase Physical Activity Yes       Intervention Provide advice, education, support and counseling about physical activity/exercise needs.;Develop an individualized exercise prescription for aerobic and resistive training based on initial evaluation findings, risk stratification, comorbidities and participant's personal goals.       Expected Outcomes Achievement of increased cardiorespiratory fitness and enhanced flexibility, muscular endurance and strength shown through measurements of functional capacity and personal statement of participant.       Increase Strength and Stamina Yes       Intervention Provide advice, education, support and counseling about physical activity/exercise needs.;Develop an individualized exercise prescription for aerobic and resistive training based on initial evaluation findings, risk stratification, comorbidities and participant's personal goals.       Expected Outcomes Achievement of increased cardiorespiratory fitness and enhanced flexibility, muscular endurance and strength shown through measurements of functional capacity and personal statement of participant.          Exercise Goals Re-Evaluation :     Exercise Goals Re-Evaluation    Row Name 03/16/16 1521 03/29/16 1639 04/13/16 1534         Exercise Goal Re-Evaluation   Exercise Goals Review Increase Physical Activity;Increase Strenth and Stamina Increase Physical Activity;Increase Strenth and Stamina Increase Physical Activity;Increase Strenth and Stamina     Comments Winfred has returned after being out with a stroke.  He was able to return with reduced workloads as he had been out for over 2 months.  We will work with returning him back to where he was previously. Winfred is off to a good start with his return to rehab.  He is up to 11 min on the treadmill.  We will continue to work on his  progression. Windred is doing well in rehab again!  He has increased his spm on the NuStep and is back up to 15 min on the BioStep and NuStep.  He has also bumped up his treadmill to 0.9 mph.   We will continue to monitor his progression.     Expected Outcomes Short: Winfred will come to exercise to work on strength and stamina.  Long: We will revisit adding back in his home exercise reviewed back in December Short: Winfred will increase his time on the equipment.  Long: Work on IT sales professional. Short: Winfred will continue to try to increase his time on the treadmill. Long: Continue to work on his strength and stamina.        Discharge Exercise Prescription (Final Exercise Prescription Changes):     Exercise Prescription Changes - 04/13/16 1500      Response to Exercise   Blood Pressure (Admit) 124/72   Blood Pressure (Exercise) 148/74   Blood Pressure (Exit) 126/66   Heart Rate (Admit) 72 bpm   Heart Rate (Exercise) 107 bpm   Heart Rate (Exit) 100 bpm   Oxygen Saturation (Admit) 93 %   Oxygen Saturation (Exercise) 92 %   Oxygen Saturation (Exit) 92 %   Rating of Perceived Exertion (Exercise) 13   Perceived Dyspnea (Exercise) 1   Symptoms none   Duration Progress to 45  minutes of aerobic exercise without signs/symptoms of physical distress   Intensity THRR unchanged     Progression   Progression Continue to progress workloads to maintain intensity without signs/symptoms of physical distress.   Average METs 1.9     Resistance Training   Training Prescription Yes   Weight 2 lbs   Reps 10-15     Interval Training   Interval Training No     Oxygen   Oxygen Continuous   Liters 4-6  6L on treadmill     Treadmill   MPH 0.9   Grade 0   Minutes 10   METs 1.6     NuStep   Level 3   SPM 68   Minutes 15   METs 2     Biostep-RELP   Level 3   Minutes 15   METs 2     Home Exercise Plan   Plans to continue exercise at [facility] (comment)  [facility], Home (walk/treadmill)   Frequency Add 2 additional days to program exercise sessions.   Initial Home Exercises Provided 12/18/15      Nutrition:  Target Goals: Understanding of nutrition guidelines, daily intake of sodium <1570m, cholesterol <2020m calories 30% from fat and 7% or less from saturated fats, daily to have 5 or more servings of fruits and vegetables.  Biometrics:    Nutrition Therapy Plan and Nutrition Goals:   Nutrition Discharge: Rate Your Plate Scores:   Nutrition Goals Re-Evaluation:   Nutrition Goals Discharge (Final Nutrition Goals Re-Evaluation):   Psychosocial: Target Goals: Acknowledge presence or absence of significant depression and/or stress, maximize coping skills, provide positive support system. Participant is able to verbalize types and ability to use techniques and skills needed for reducing stress and depression.   Initial Review & Psychosocial Screening:   Quality of Life Scores:   PHQ-9: Recent Review Flowsheet Data    [facility] 11/26[facility]2015 10/27/2015 07/25/2014 05/22/2014 06/01/2012   Decreased Interest 0 0 0 0 0   Down, Depressed, Hopeless 0 0 0 0 0   PHQ - 2 Score 0 0 0 0 0   Altered sleeping - 0 - - -   Tired, decreased energy - 1 - - -   Change in appetite - 0 - - -   Feeling bad or failure about yourself  - 0 - - -   Trouble concentrating - 0 - - -   Moving slowly or fidgety/restless - 0 - - -   Suicidal thoughts - 0 - - -   PHQ-9 Score - 1 - - -   Difficult doing work/chores - Not difficult at all - - -     Interpretation of Total Score  Total Score Depression Severity:  1-4 = Minimal depression, 5-9 = Mild depression, 10-14 = Moderate depression, 15-19 = Moderately severe depression, 20-27 = Severe depression   Psychosocial Evaluation and Intervention:     Psychosocial Evaluation - 11/09/15 1235      Psychosocial Evaluation & Interventions   Interventions Encouraged to exercise with the program and  follow exercise prescription;Stress management education;Relaxation education   Comments Counselor met with Randall. Long. [name] for initial psychosocial evaluation.  He is an [age]ear old gentleman who has been married 6066ears and has a strong support system with adult children and active involvement in his local church community.  Randall. H Lemmie Evenstates he sleeps well (with occasional Tylenol at bedtime) and he has a good appetite.  He denies a history or current symptoms of depression or anxiety and states he is typically in a positive mood.  He has minimal stress in his life although he "worries" some about the health of his wife as she "works too hard taking care of me."  Randall. Lemmie Evens has goals to increase his stamin and strength and to breathe better while in this program.        Psychosocial Re-Evaluation:     Psychosocial Re-Evaluation    Row Name 12/08/15 1231 12/28/15 1623 03/16/16 1543 04/05/16 0630       Psychosocial Re-Evaluation   Comments See 30 note 12/08/15 [name] psychosocial assessment reveals no barriers at this time to participation in Pulmonary Rehab.  He  has good family and friend support that encourages Doniven to participate in [place] and progress with His goals.  Clayden concerns are monitored, but he  has acknowledge that attending the program has helped to maintain quality life with improved mobility, self-care, and emotional and financial stability.  Farhaan is commended for regular attendance and self-motivation to improve His pulmonary disease management. Having been off from [place] for almost 2 months, [facility] returned recently and is doing well. His exercise goals have been modified, but his home therapy and prepared him to return to [facility]. His wife is very supportive and helps him with many tasks. 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 He has a very positive attitude and is commended for his participation in [place].  --     Expected Outcomes  --  --  -- [name] psychosocial assessment reveals no barriers at this time to participation in Pulmonary Rehab.  He  has good family and friend support that encourages Antone to participate in [place] and progress with His goals.  Kelwin concerns are monitored, but he  has acknowledge that attending the program has helped to maintain quality life with improved mobility, self-care, and emotional and financial stability.  Travonte is commended for regular attendance and self-motivation to improve His pulmonary disease management.       Psychosocial Discharge (Final Psychosocial Re-Evaluation):     Psychosocial Re-Evaluation - 04/05/16 0630      Psychosocial Re-Evaluation   Expected Outcomes [name] psychosocial assessment reveals no barriers at this time to participation in Pulmonary Rehab.  He  has good family and friend support that encourages Shaquan to participate in [place] and progress with His goals.  Merik concerns are monitored, but he  has acknowledge that attending the program has helped to maintain quality life with improved mobility, self-care, and emotional and financial stability.  Hulan is commended for regular attendance and self-motivation to improve His pulmonary disease management.      Education: Education Goals: Education classes will be provided on a weekly basis, covering required topics. Participant will state understanding/return demonstration of topics presented.  Learning Barriers/Preferences:   Education Topics: Initial Evaluation Education: - Verbal, written and demonstration of respiratory meds, RPE/PD scales, oximetry and breathing techniques. Instruction on use of nebulizers and MDIs: cleaning and proper use, rinsing mouth with steroid doses and importance of monitoring MDI activations.   Pulmonary Rehab from 04/20/2016 in [facility] Cardiac and Pulmonary Rehab  Date  10/27/15  Educator  LB  Instruction Review Code  2- meets goals/outcomes       General Nutrition Guidelines/Fats and Fiber: -Group instruction provided by verbal, written material, models and posters to present the general guidelines for heart healthy nutrition. Gives an explanation  and review of dietary fats and fiber.   Pulmonary Rehab from 04/20/2016 in [facility] Cardiac and Pulmonary Rehab  Date  03/21/16  Educator  CR  Instruction Review Code  2- meets goals/outcomes      Controlling Sodium/Reading Food Labels: -Group verbal and written material supporting the discussion of sodium use in heart healthy nutrition. Review and explanation with models, verbal and written materials for utilization of the food label.   Pulmonary Rehab from 04/20/2016 in [facility] Cardiac and Pulmonary Rehab  Date  12/14/15  Educator  CR  Instruction Review Code  2- meets goals/outcomes      Exercise Physiology & Risk Factors: - Group verbal and written instruction with models to review the exercise physiology of the cardiovascular system and associated critical values. Details cardiovascular disease risk factors and the goals associated with each risk factor.   Pulmonary Rehab from 04/20/2016 in [facility] Cardiac and Pulmonary Rehab  Date  12/02/15  Educator  AS  Instruction Review Code  2- meets goals/outcomes      Aerobic Exercise & Resistance Training: - Gives group verbal and written discussion on the health impact of inactivity. On the components of aerobic and resistive training programs and the benefits of this training and how to safely progress through these programs.   Flexibility, Balance, General Exercise Guidelines: - Provides group verbal and written instruction on the benefits of flexibility and balance training programs. Provides general exercise guidelines with specific guidelines to those with heart or lung disease. Demonstration and skill practice provided.   Stress Management: - Provides group verbal and written instruction about the health risks of elevated stress, cause  of high stress, and healthy ways to reduce stress.   Pulmonary Rehab from 04/20/2016 in [facility] Cardiac and Pulmonary Rehab  Date  04/20/16  Educator  [facility]  Instruction Review Code  2- meets goals/outcomes      Depression: - Provides group verbal and written instruction on the correlation between heart/lung disease and depressed mood, treatment options, and the stigmas associated with seeking treatment.   Pulmonary Rehab from 04/20/2016 in [facility] Cardiac and Pulmonary Rehab  Date  12/21/15  Educator  [facility]  Instruction Review Code  2- meets goals/outcomes      Exercise & Equipment Safety: - Individual verbal instruction and demonstration of equipment use and safety with use of the equipment.   Infection Prevention: - Provides verbal and written material to individual with discussion of infection control including proper hand washing and proper equipment cleaning during exercise session.   Pulmonary Rehab from 04/20/2016 in [facility] Cardiac and Pulmonary Rehab  Date  10/27/15  Educator  LB  Instruction Review Code  2- meets goals/outcomes      Falls Prevention: - Provides verbal and written material to individual with discussion of falls prevention and safety.   Pulmonary Rehab from 04/20/2016 in [facility] Cardiac and Pulmonary Rehab  Date  10/27/15  Educator  LB  Instruction Review Code  2- meets goals/outcomes      Diabetes: - Individual verbal and written instruction to review signs/symptoms of diabetes, desired ranges of glucose level fasting, after meals and with exercise. Advice that pre and post exercise glucose checks will be done for 3 sessions at entry of program.   Chronic Lung Diseases: - Group verbal and written instruction to review new updates, new respiratory medications, new advancements in procedures and treatments. Provide informative websites and "800" numbers of self-education.   Pulmonary Rehab from 04/20/2016 in [facility] Cardiac and Pulmonary Rehab  Date  12/16/15  Educator  LB   Instruction Review Code  2- meets goals/outcomes      Lung Procedures: - Group verbal and written instruction to describe testing methods done to diagnose lung disease. Review the outcome of test results. Describe the treatment choices: Pulmonary Function Tests, ABGs and oximetry.   Energy Conservation: - Provide group verbal and written instruction for methods to conserve energy, plan and organize activities. Instruct on pacing techniques, use of adaptive equipment and posture/positioning to relieve shortness of breath.   Pulmonary Rehab from 04/20/2016 in [facility] Cardiac and Pulmonary Rehab  Date  03/16/16  Educator  [facility]  Instruction Review Code  2- meets goals/outcomes      Triggers: - Group verbal and written instruction to review types of environmental controls: home humidity, furnaces, filters, dust mite/pet prevention, HEPA vacuums. To discuss weather changes, air quality and the benefits of nasal washing.   Exacerbations: - Group verbal and written instruction to provide: warning signs, infection symptoms, calling MD promptly, preventive modes, and value of vaccinations. Review: effective airway clearance, coughing and/or vibration techniques. Create an Sports administrator.   Pulmonary Rehab from 04/20/2016 in [facility] Cardiac and Pulmonary Rehab  Date  12/23/15  Educator  LB  Instruction Review Code  2- meets goals/outcomes      Oxygen: - Individual and group verbal and written instruction on oxygen therapy. Includes supplement oxygen, available portable oxygen systems, continuous and intermittent flow rates, oxygen safety, concentrators, and Medicare reimbursement for oxygen.   Respiratory Medications: - Group verbal and written instruction to review medications for lung disease. Drug class, frequency, complications, importance of spacers, rinsing mouth after steroid MDI's, and proper cleaning methods for nebulizers.   Pulmonary Rehab from 04/20/2016 in [facility] Cardiac and Pulmonary Rehab   Date  10/27/15  Educator  LB  Instruction Review Code  2- meets goals/outcomes      AED/CPR: - Group verbal and written instruction with the use of models to demonstrate the basic use of the AED with the basic ABC's of resuscitation.   Breathing Retraining: - Provides individuals verbal and written instruction on purpose, frequency, and proper technique of diaphragmatic breathing and pursed-lipped breathing. Applies individual practice skills.   Pulmonary Rehab from 04/20/2016 in [facility] Cardiac and Pulmonary Rehab  Date  10/27/15  Educator  LB  Instruction Review Code  2- meets goals/outcomes      Anatomy and Physiology of the Lungs: - Group verbal and written instruction with the use of models to provide basic lung anatomy and physiology related to function, structure and complications of lung disease.   Heart Failure: - Group verbal and written instruction on the basics of heart failure: signs/symptoms, treatments, explanation of ejection fraction, enlarged heart and cardiomyopathy.   Sleep Apnea: - Individual verbal and written instruction to review Obstructive Sleep Apnea. Review of risk factors, methods for diagnosing and types of masks and machines for OSA.   Anxiety: - Provides group, verbal and written instruction on the correlation between heart/lung disease and anxiety, treatment options, and management of anxiety.   Pulmonary Rehab from 04/20/2016 in [facility] Cardiac and Pulmonary Rehab  Date  04/20/16  Educator  [facility]  Instruction Review Code  2- Meets goals/outcomes      Relaxation: - Provides group, verbal and written instruction about the benefits of relaxation for patients with heart/lung disease. Also provides patients with examples of relaxation techniques.   Pulmonary Rehab from 04/20/2016 in [facility] Cardiac and Pulmonary Rehab  Date  11/18/15  Educator  [place]  Instruction Review Code  2- Meets goals/outcomes      Knowledge Questionnaire Score:    Core  Components/Risk Factors/Patient Goals at Admission:   Core Components/Risk Factors/Patient Goals Review:      Goals and Risk Factor Review    Row Name 11/23/15 1439 12/08/15 0640 12/28/15 1614 03/16/16 1537 04/05/16 0622     Core Components/Risk Factors/Patient Goals Review   Personal Goals Review Sedentary;Increase Strength and Stamina;Improve shortness of breath with ADL's;Develop more efficient breathing techniques such as purse lipped breathing and diaphragmatic breathing and practicing self-pacing with activity.;Increase knowledge of respiratory medications and ability to use respiratory devices properly. Sedentary;Increase Strength and Stamina;Improve shortness of breath with ADL's;Increase knowledge of respiratory medications and ability to use respiratory devices properly. Sedentary;Increase Strength and Stamina;Improve shortness of breath with ADL's;Increase knowledge of respiratory medications and ability to use respiratory devices properly.;Develop more efficient breathing techniques such as purse lipped breathing and diaphragmatic breathing and practicing self-pacing with activity.;Lipids Improve shortness of breath with ADL's;Increase knowledge of respiratory medications and ability to use respiratory devices properly.;Develop more efficient breathing techniques such as purse lipped breathing and diaphragmatic breathing and practicing self-pacing with activity. Increase knowledge of respiratory medications and ability to use respiratory devices properly.;Develop more efficient breathing techniques such as purse lipped breathing and diaphragmatic breathing and practicing self-pacing with activity.;Lipids   Review Randall Platts has completed 3 weeks in [facility] and states an improvement with his strength. He has recently started on Mucinex which has improved his sputum production. He has a good understanding of his Albuterol MDI and carries it with him. The spacer he uses at home. His PD's for  shortness of breath are 3-4 and does not notice improvement in his breathing, although he does use PLB and adjusts his oxygen to 5l/m at home with activity. Randall Oliveto walks at home his driveway with pacing and use of his home oximeter. Unfortunately he uses intermittent flow with the oxygen, so  his O2Sat's do drop in the 80's - encouraged him to rest and pace himself to keep O2Sat's 88% or greater. Randall Blaker is still walking his driveway on his days off from [place], although with the cold weather, he has been walking at [facility]. He recently purchased new sneakers for better support for exercise. His breathing has worsened over the last couple of weeks. He physician has ordered an EKG.  Randall Gleaves has had a productive cough, but the sputum has been clear. He does use a flutter valve.  Randall Nez has progressed his goals on the NS, TM, and AE. He adjusts his oxygen from 4-6l/m with his exercise.  Recently he bought a new pair of sneakers for better support during his exercise. He is using his Albuterol MDI with the aerochamber. His physician has ordered a vibrating vest for mobilization of his secretions. Randall Saldivar is compliant with his lipid medication. Randall Dorton has returned to [place] after last attending 12/31/16 due to a stroke. Through home therapy, he has improved his stamina and endurance and returned to [place] with only a few modifications to his exercise goals. He occasionally uses his albuterol with his spacer and uses his vibrating vest . He has good PLB technique and adjusts to oxygen to 4-6l/m. He is also compliant with his other medication, including his Prilosec for his lipids. Randall Langlinais is making progress on his exercise goals - he recently went up on the TM to 0.48mh. He paces himself and adjusts his oxygen to  6l/m for exercise. He continues to use his vibrating vest twice a day and does have a productive cough. When using his Albuterol MDI, he does use the spacer with  good technique.Marland Kitchen He likes to walk in the house from his chair to the back door and will exercise 3-4 times a day. He is compliant with his medication for his lipids. I commend Randall [facility] for his regular attendence and participation in the education.   Expected Outcomes  -- Continue exercising to increase his strength and help improve shortness of breath. Contact oxygen company for a larger portable tank. Continue to manage his bronchiectasis with the vibrating vest and oxygen.  -- Continue to progress with his exercise goals and improve his shortness of breath with continued activity.      Core Components/Risk Factors/Patient Goals at Discharge (Final Review):      Goals and Risk Factor Review - 04/05/16 0622      Core Components/Risk Factors/Patient Goals Review   Personal Goals Review Increase knowledge of respiratory medications and ability to use respiratory devices properly.;Develop more efficient breathing techniques such as purse lipped breathing and diaphragmatic breathing and practicing self-pacing with activity.;Lipids   Review Randall Consiglio is making progress on his exercise goals - he recently went up on the TM to 0.24mh. He paces himself and adjusts his oxygen to 6l/m for exercise. He continues to use his vibrating vest twice a day and does have a productive cough. When using his Albuterol MDI, he does use the spacer with good technique..Marland KitchenHe likes to walk in the house from his chair to the back door and will exercise 3-4 times a day. He is compliant with his medication for his lipids. I commend [facility] and participation in the education.   Expected Outcomes Continue to progress with his exercise goals and improve his shortness of breath with continued activity.      ITP Comments:     ITP Comments    Row Name 01/04/16 1609 01/05/16 1532 01/20/16 1450 02/23/16 1325 03/14/16 1534   ITP Comments Ms Radcliffe called today. Randall HMaackis being tested  for a possible stroke on 01/03/16. He will [place] as [place] as [place] rule in for an embolic stroke.  He was discharged today 01/05/16 with HHPT and possible nuero rehab after that.  We will follow with them next week to determine plan of action for pulmonary rehab. Called Randall Piech to check how he was doing after his strike. He states he is having home therapy and has improved to "almost normal". The chest vest therapy has been helpful which he performs twice daily. He will inform u[place]about [place] progress and  return to [place] Called Randall HRekowski- last attended 12/31/16 due to a stroke. He had his last session of PT last week. He has a procedure coming up,  and the weather has kept him in the house. His wife will call back with more information on his [place] List was cleared for return to [facility] by Dr MLake Bells       Comments: 30 Day Note Review

## 2016-04-26 ENCOUNTER — Ambulatory Visit: Payer: Medicare Other | Admitting: Podiatry

## 2016-04-27 ENCOUNTER — Encounter: Payer: Medicare Other | Admitting: *Deleted

## 2016-04-27 DIAGNOSIS — J9611 Chronic respiratory failure with hypoxia: Secondary | ICD-10-CM

## 2016-04-27 DIAGNOSIS — J449 Chronic obstructive pulmonary disease, unspecified: Secondary | ICD-10-CM

## 2016-04-27 DIAGNOSIS — J439 Emphysema, unspecified: Secondary | ICD-10-CM | POA: Diagnosis not present

## 2016-04-27 DIAGNOSIS — J479 Bronchiectasis, uncomplicated: Secondary | ICD-10-CM

## 2016-04-27 NOTE — Progress Notes (Signed)
Daily Session Note  Patient Details  Name: [name] MRN: [id number] Date of Birth: 24-Aug-1928 Referring Provider:     Pulmonary Rehab from 10/27/2015 in [facility] Cardiac and Pulmonary Rehab  Referring Provider  Simonne Maffucci MD      Encounter Date: 04/27/2016  Check In:     Session Check In - 04/27/16 1312      Check-In   Location --   Staff Present --   Supervising physician immediately available to respond to emergencies --           Exercise Prescription Changes - 04/27/16 1400      Response to Exercise   Blood Pressure (Admit) 122/68   Blood Pressure (Exercise) 140/80   Blood Pressure (Exit) 122/62   Heart Rate (Admit) 81 bpm   Heart Rate (Exercise) 115 bpm   Heart Rate (Exit) 95 bpm   Oxygen Saturation (Admit) 98 %   Oxygen Saturation (Exercise) 90 %   Oxygen Saturation (Exit) 95 %   Rating of Perceived Exertion (Exercise) 13   Perceived Dyspnea (Exercise) 2   Symptoms none   Duration Progress to 45 minutes of aerobic exercise without signs/symptoms of physical distress   Intensity THRR unchanged     Progression   Progression Continue to progress workloads to maintain intensity without signs/symptoms of physical distress.   Average METs 1.79     Resistance Training   Training Prescription Yes   Weight 3 lbs   Reps 10-15     Interval Training   Interval Training No     Oxygen   Oxygen Continuous   Liters 4-6  6L on treadmill     Treadmill   MPH 1   Grade 0   Minutes 15  5 min 10 min   METs 1.77     NuStep   Level 5   SPM 57   Minutes 15   METs 1.6     Biostep-RELP   Level 3   Minutes 15   METs 2     Home Exercise Plan   Plans to continue exercise at [employer] (comment)  [facility], Home (walk/treadmill)   Frequency Add 2 additional days to program exercise sessions.   Initial Home Exercises Provided 12/18/15      History  Smoking Status  . Former Smoker  . Packs/day: 1.00  . Years: 50.00  . Types:  Cigarettes  . Quit date: 04/14/1999  Smokeless Tobacco  . Never Used    Goals Met:  Proper associated with RPD/PD & O2 Sat Using PLB without cueing & demonstrates good technique Exercise tolerated well Strength training completed today  Goals Unmet:  Not Applicable  Comments: Pt able to follow exercise prescription today without complaint.  Will continue to monitor for progression.    Dr. Emily Filbert is Medical Director for [facility] and [facility]monary [facility].

## 2016-05-02 DIAGNOSIS — J9611 Chronic respiratory failure with hypoxia: Secondary | ICD-10-CM | POA: Diagnosis not present

## 2016-05-02 DIAGNOSIS — J439 Emphysema, unspecified: Secondary | ICD-10-CM | POA: Diagnosis not present

## 2016-05-02 DIAGNOSIS — J479 Bronchiectasis, uncomplicated: Secondary | ICD-10-CM

## 2016-05-02 DIAGNOSIS — J449 Chronic obstructive pulmonary disease, unspecified: Secondary | ICD-10-CM

## 2016-05-02 NOTE — Progress Notes (Signed)
Daily Session Note  Patient Details  Name: [name] MRN: [id number] Date of Birth: [birth year] Referring Provider:     Pulmonary Rehab from 10/27/2015 in [facility] Cardiac and Pulmonary Rehab  Referring Provider  Simonne Maffucci MD      Encounter Date: 05/02/2016  Check In:     Session Check In - 05/02/16 1242      Check-In   Location [facility]-Cardiac & Pulmonary Rehab   Staff Present Earlean Shawl, BS, ACSM CEP, Exercise Physiologist;Laureen Owens Shark, BS, RRT, Respiratory Dareen Piano, BA, ACSM CEP, Exercise Physiologist   Supervising physician immediately available to respond to emergencies [facility] immediately available ER MD   Physician(s) Mable Paris and Kinner   Medication changes reported     No   Fall or balance concerns reported    No   Tobacco Cessation No Change   Warm-up and Cool-down Performed as group-led instruction   Resistance Training Performed Yes   VAD Patient? No     Pain Assessment   Currently in Pain? No/denies         History  Smoking Status  . Former Smoker  . Packs/day: 1.00  . Years: 50.00  . Types: Cigarettes  . Quit date: 04/14/1999  Smokeless Tobacco  . Never Used    Goals Met:  Proper associated with RPD/PD & O2 Sat Independence with exercise equipment Exercise tolerated well Strength training completed today  Goals Unmet:  Not Applicable  Comments: Pt able to follow exercise prescription today without complaint.  Will continue to monitor for progression.    Dr. Emily Filbert is Medical Director for [place] and [facility]monary Rehabilitation.

## 2016-05-03 ENCOUNTER — Ambulatory Visit (INDEPENDENT_AMBULATORY_CARE_PROVIDER_SITE_OTHER): Payer: Medicare Other | Admitting: Pulmonary Disease

## 2016-05-03 ENCOUNTER — Encounter: Payer: Self-pay | Admitting: Pulmonary Disease

## 2016-05-03 VITALS — BP 122/70 | HR 105 | Ht 70.0 in | Wt 164.0 lb

## 2016-05-03 DIAGNOSIS — J9611 Chronic respiratory failure with hypoxia: Secondary | ICD-10-CM

## 2016-05-03 DIAGNOSIS — J449 Chronic obstructive pulmonary disease, unspecified: Secondary | ICD-10-CM | POA: Diagnosis not present

## 2016-05-03 MED ORDER — FLUTICASONE FUROATE-VILANTEROL 100-25 MCG/INH IN AEPB: 1.0000 | INHALATION_SPRAY | Freq: Every day | RESPIRATORY_TRACT | 0 refills | Status: AC

## 2016-05-03 NOTE — Progress Notes (Signed)
Subjective:    Patient ID: [name], male    DOB: [birth year], 83 y.o.   MRN: [id number]  Synopsis: former smoker with moderate airflow obstruction due to COPD And bronchiectasis.Marland Kitchen  Spirometry in 12/2011 showed clear airflow obstruction and an FEV1 of 2.12 L (67% pred). August 2017 CT chest images personally reviewed showing bronchiectatic changes in addition to findings consistent with COPD (emphysema)   HPI Chief Complaint  Patient presents with  . Follow-up    pt c/o worsening sob with any exertion, sometimes prod cough.     [name] continues to struggle with dyspnea on exertion.  He has been participating with exercise at [facility], uses 5-6 L continuous when he goes there.  He measures his oxygen at home frequently when resting on 4 LPM.     He is struggling with dyspnea.  He is not able to get out and do what he wants to right now.    He has not had bronchitis or pneumonia since the last visit.   He continues to use his vest and he has been bringing up more mucus.    Past Medical History:  Diagnosis Date  . AF (atrial fibrillation) ([place])    after PTX 2012  . Allergy   . Arthritis    h/o R shoulder injection per ortho  . Back pain   . BPH (benign prostatic hyperplasia)   . COPD (chronic obstructive pulmonary disease) ([place])   . Coronary artery disease   . Elevated MCV   . Erectile dysfunction   . GERD (gastroesophageal reflux disease)   . HSV-1 (herpes simplex virus 1) infection   . Hyperlipidemia   . Spontaneous pneumothorax 09/04/2010   [name]     Review of Systems  Constitutional: Positive for fatigue. Negative for chills and fever.  HENT: Negative for postnasal drip, rhinorrhea and sinus pressure.   Respiratory: Positive for shortness of breath. Negative for cough and wheezing.   Cardiovascular: Negative for chest pain, palpitations and leg swelling.       Objective:   Physical Exam Vitals:   05/03/16 1346  BP: 122/70  Pulse: (!) 105  SpO2:  93%  Weight: 164 lb (74.4 kg)  Height: _0  (1.778 m)   2L [place]  Gen: chronically ill appearing HENT: OP clear, TM's clear, neck supple PULM: Poor air movement B, normal percussion CV: RRR, no mgr, trace edema GI: BS+, soft, nontender Derm: no cyanosis or rash Psyche: normal mood and affect     BMET    Component Value Date/Time   NA 141 01/14/2016 1620   K 4.2 01/14/2016 1620   K 4.1 05/29/2015   CL 103 01/14/2016 1620   CO2 29 01/14/2016 1620   CO2 29 05/29/2015   GLUCOSE 115 (H) 01/14/2016 1620   BUN 20 01/14/2016 1620   CREATININE 1.15 01/14/2016 1620   CREATININE 1.300 05/29/2015   CALCIUM 9.3 01/14/2016 1620   GFRNONAA >60 01/04/2016 0724   GFRAA >60 01/04/2016 0724       Assessment & Plan:   COPD He has severe COPD and now has reached a point where he has very severe dyspnea and hypoxemia. We have performed an extensive workup looking for alternative causes and only found some degree of bronchiectasis. At this point his lung disease is near end-stage.  Plan: Add Breo Continue Spiriva Increase the amount of oxygen he uses with exertion If no improvement in symptoms with these interventions then I think it's likely he needs to  go on hospice. I explained this to him and his wife at length today and they voiced understanding.  Greater than 50% of this 28 minute visit spent face-to-face  Chronic hypoxemic respiratory failure (HCC) 4 L at rest 6 L continuous flow oxygen with exertion. We will send an order to advance so that he can have continuous flow and not pulse. We will also add an oxygen conserving device.   Updated Medication List Outpatient Encounter Prescriptions as of 05/03/2016  Medication Sig  . acetaminophen (TYLENOL) 325 MG tablet Take 650 mg by mouth every 6 (six) hours as needed for mild pain.   Marland Kitchen albuterol (PROAIR HFA) 108 (90 Base) MCG/ACT inhaler Inhale 2 puffs into the lungs every 4 (four) hours as needed for wheezing or shortness of  breath.  . Cyanocobalamin (VITAMIN B 12 PO) Take by mouth daily.  Marland Kitchen FIBER PO Take 1 capsule by mouth 2 (two) times daily.  . fluticasone (FLONASE) 50 MCG/ACT nasal spray Place 2 sprays into both nostrils daily. (Patient taking differently: Place 2 sprays into both nostrils daily as needed for allergies. )  . metoprolol tartrate (LOPRESSOR) 25 MG tablet Take 0.5 tablets (12.5 mg total) by mouth as needed (for palpitations).  . Multiple Vitamin (MULTIVITAMIN) tablet Take 1 tablet by mouth daily. Reported on 04/22/2015  . multivitamin-lutein (OCUVITE-LUTEIN) CAPS capsule Take 1 capsule by mouth daily.  . NON FORMULARY Oxygen 2-5 liters 24/7  . omeprazole (PRILOSEC) 20 MG capsule Take 20 mg by mouth daily.    . pravastatin (PRAVACHOL) 40 MG tablet Take 0.5 tablets (20 mg total) by mouth 2 (two) times a week.  Marland Kitchen Respiratory Therapy Supplies (FLUTTER) DEVI Use as directed  . rivaroxaban (XARELTO) 20 MG TABS tablet Take 1 tablet (20 mg total) by mouth daily with supper.  . tamsulosin (FLOMAX) 0.4 MG CAPS capsule Take 2 capsules (0.8 mg total) by mouth daily.  Marland Kitchen tiotropium (SPIRIVA) 18 MCG inhalation capsule Place 1 capsule (18 mcg total) into inhaler and inhale daily.  . traMADol (ULTRAM) 50 MG tablet TAKE 1 TABLET BY MOUTH EVERY 12 HOURS AS NEEDED FOR PAIN  . traZODone (DESYREL) 100 MG tablet TAKE 2 TABLETS (200 MG TOTAL) BY MOUTH AT BEDTIME.  . fluticasone furoate-vilanterol (BREO ELLIPTA) 100-25 MCG/INH AEPB Inhale 1 puff into the lungs daily.   No facility-administered encounter medications on file as of 05/03/2016.

## 2016-05-03 NOTE — Assessment & Plan Note (Signed)
He has severe COPD and now has reached a point where he has very severe dyspnea and hypoxemia. We have performed an extensive workup looking for alternative causes and only found some degree of bronchiectasis. At this point his lung disease is near end-stage.  Plan: Add Breo Continue Spiriva Increase the amount of oxygen he uses with exertion If no improvement in symptoms with these interventions then I think it's likely he needs to go on hospice. I explained this to him and his wife at length today and they voiced understanding.  Greater than 50% of this 28 minute visit spent face-to-face

## 2016-05-03 NOTE — Patient Instructions (Signed)
In addition to spiriva take Breo one puff daily Use 4 L of oxygen at rest, 6 L continuous with exertion We will bring you back in 3-4 weeks to meet with one of our nurse practitioners to see how you are doing with the extra oxygen. If at any point you feel that you are ready to go on hospice let us know.

## 2016-05-03 NOTE — Assessment & Plan Note (Signed)
4 L at rest 6 L continuous flow oxygen with exertion. We will send an order to advance so that he can have continuous flow and not pulse. We will also add an oxygen conserving device.

## 2016-05-11 ENCOUNTER — Telehealth: Payer: Self-pay | Admitting: *Deleted

## 2016-05-11 ENCOUNTER — Other Ambulatory Visit: Payer: Self-pay | Admitting: Family Medicine

## 2016-05-11 NOTE — Telephone Encounter (Signed)
Called to check on status of return to program, out since last Monday 4/16.  Left message on machine.

## 2016-05-11 NOTE — Telephone Encounter (Signed)
Received refill request from pharmacy. Reviewed previous refill notes.Marland Kitchen Spoke to patient and was advised that he does not need the Xarelto from [facility]. Patient stated that he got this from the [place].

## 2016-05-12 ENCOUNTER — Telehealth: Payer: Self-pay | Admitting: Pulmonary Disease

## 2016-05-12 ENCOUNTER — Telehealth: Payer: Self-pay

## 2016-05-12 NOTE — Telephone Encounter (Signed)
[name] returned a call to say he re-injured his hip and does not want to return to [facility].  He would like to be discharged.

## 2016-05-12 NOTE — Telephone Encounter (Signed)
See note below. I canceled the prescription since he gets it from the [place]. Thanks.

## 2016-05-12 NOTE — Telephone Encounter (Signed)
I saw this message and the referral response from [place] at [facility]. I called Melissa with [facility] and she stated that the wife had just called [facility] at 1:30pm and was asking about the oxymizer. Melissa stated they offered to make an appt with the RT but the wife declined at that time. Then the husband calls here asking about his continous 02 and that [facility] didn't have the order. When I finished talking with Melissa. I called Mr. Baig. I tried to explain to him what Melissa told me about the conversation with [facility] and his wife. He understood that tanks would be brought to his house and when they were empty would be refilled. He was also talking about 10 liters something. I just read the order that was placed to him and that it didn't state 10 liters it only stated 6 lpm. I gave them [facility] number to call and schedule with the RT to go over the correct way to use the 02 that he has

## 2016-05-13 ENCOUNTER — Encounter: Payer: Self-pay | Admitting: *Deleted

## 2016-05-13 DIAGNOSIS — J479 Bronchiectasis, uncomplicated: Secondary | ICD-10-CM

## 2016-05-13 DIAGNOSIS — J9611 Chronic respiratory failure with hypoxia: Secondary | ICD-10-CM

## 2016-05-13 DIAGNOSIS — J449 Chronic obstructive pulmonary disease, unspecified: Secondary | ICD-10-CM

## 2016-05-13 NOTE — Progress Notes (Signed)
Discharge Summary  Patient Details  Name: [name] MRN: [id number] Date of Birth: [birth year] Referring Provider:     Pulmonary Rehab from 10/27/2015 in [facility]  Referring Provider  Simonne Maffucci MD       Number of Visits: 27/36  Reason for Discharge:  Early Exit:  Personal and Injury  Smoking History:  History  Smoking Status  . Former Smoker  . Packs/day: 1.00  . Years: 50.00  . Types: Cigarettes  . Quit date: 04/14/1999  Smokeless Tobacco  . Never Used    Diagnosis:  Bronchiectasis without complication (HCC)  Chronic respiratory failure with hypoxia (HCC)  COPD, mild (HCC)  ADL UCSD:     Pulmonary Assessment Scores    Row Name 12/23/15 1317 12/30/15 0753       ADL UCSD   ADL Phase Mid Mid    SOB Score total 91  -    Rest 1  -    Walk 3  -    Stairs 5  -    Bath 4  -    Dress 4  -    Shop 4  -       Initial Exercise Prescription:   Discharge Exercise Prescription (Final Exercise Prescription Changes):     Exercise Prescription Changes - 04/27/16 1400      Response to Exercise   Blood Pressure (Admit) 122/68   Blood Pressure (Exercise) 140/80   Blood Pressure (Exit) 122/62   Heart Rate (Admit) 81 bpm   Heart Rate (Exercise) 115 bpm   Heart Rate (Exit) 95 bpm   Oxygen Saturation (Admit) 98 %   Oxygen Saturation (Exercise) 90 %   Oxygen Saturation (Exit) 95 %   Rating of Perceived Exertion (Exercise) 13   Perceived Dyspnea (Exercise) 2   Symptoms none   Duration Progress to 45 minutes of aerobic exercise without signs/symptoms of physical distress   Intensity THRR unchanged     Progression   Progression Continue to progress workloads to maintain intensity without signs/symptoms of physical distress.   Average METs 1.79     Resistance Training   Training Prescription Yes   Weight 3 lbs   Reps 10-15     Interval Training   Interval Training No     Oxygen   Oxygen Continuous   Liters 4-6  6L on  treadmill     Treadmill   MPH 1   Grade 0   Minutes 15  5 min 10 min   METs 1.77     NuStep   Level 5   SPM 57   Minutes 15   METs 1.6     Biostep-RELP   Level 3   Minutes 15   METs 2     Home Exercise Plan   Plans to continue exercise at [employer] (comment)  [facility], Home (walk/treadmill)   Frequency Add 2 additional days to program exercise sessions.   Initial Home Exercises Provided 12/18/15      Functional Capacity:     6 Minute Walk    Row Name 12/23/15 1318         6 Minute Walk   Phase Mid Program     Distance 460 feet     Walk Time 4.27 minutes     # of Rest Breaks 5  20 sec, 10 sec, 24 sec, 20 sec, 30 sec     MPH 1.22     METS 1.9  RPE 13     Perceived Dyspnea  4     VO2 Peak 3.36     Symptoms Yes (comment)     Comments SOB     Resting HR 88 bpm     Resting BP 126/70     Max Ex. HR 113 bpm     Max Ex. BP 134/66     2 Minute Post BP 126/64       Interval HR   Baseline HR 88     1 Minute HR 115     2 Minute HR 107     3 Minute HR 112     4 Minute HR 88     5 Minute HR 106     6 Minute HR 106     2 Minute Post HR 75     Interval Heart Rate? Yes       Interval Oxygen   Interval Oxygen? Yes     Baseline Oxygen Saturation % 93 %     Baseline Liters of Oxygen 5 L  pulsed     1 Minute Oxygen Saturation % 85 %  84% 1:03-1:23     1 Minute Liters of Oxygen 6 L  continuous     2 Minute Oxygen Saturation % 89 %  rest 2:18-228, 84% 2:51-3:15     2 Minute Liters of Oxygen 6 L     3 Minute Oxygen Saturation % 85 %     3 Minute Liters of Oxygen 8 L     4 Minute Oxygen Saturation % 92 %  82% 3:38-3:58     4 Minute Liters of Oxygen 8 L     5 Minute Oxygen Saturation % 84 %  84% 5:00-5:30     5 Minute Liters of Oxygen 8 L     6 Minute Oxygen Saturation % 93 %     6 Minute Liters of Oxygen 8 L     2 Minute Post Oxygen Saturation % 92 %     2 Minute Post Liters of Oxygen 8 L        Psychological, QOL, Others -  Outcomes: PHQ 2/9: Depression screen [facility] 2/9 11/26/2015 10/27/2015 07/25/2014 05/22/2014 06/01/2012  Decreased Interest 0 0 0 0 0  Down, Depressed, Hopeless 0 0 0 0 0  PHQ - 2 Score 0 0 0 0 0  Altered sleeping - 0 - - -  Tired, decreased energy - 1 - - -  Change in appetite - 0 - - -  Feeling bad or failure about yourself  - 0 - - -  Trouble concentrating - 0 - - -  Moving slowly or fidgety/restless - 0 - - -  Suicidal thoughts - 0 - - -  PHQ-9 Score - 1 - - -  Difficult doing work/chores - Not difficult at all - - -  Some recent data might be hidden    Quality of Life:   Personal Goals: Goals established at orientation with interventions provided to work toward goal.    Personal Goals Discharge:     Goals and Risk Factor Review    Row Name 11/23/15 1439 12/08/15 0640 12/28/15 1614 03/16/16 1537 04/05/16 0622     Core Components/Risk Factors/Patient Goals Review   Personal Goals Review Sedentary;Increase Strength and Stamina;Improve shortness of breath with ADL's;Develop more efficient breathing techniques such as purse lipped breathing and diaphragmatic breathing and practicing self-pacing with activity.;Increase knowledge of respiratory medications and ability to use respiratory devices  properly. Sedentary;Increase Strength and Stamina;Improve shortness of breath with ADL's;Increase knowledge of respiratory medications and ability to use respiratory devices properly. Sedentary;Increase Strength and Stamina;Improve shortness of breath with ADL's;Increase knowledge of respiratory medications and ability to use respiratory devices properly.;Develop more efficient breathing techniques such as purse lipped breathing and diaphragmatic breathing and practicing self-pacing with activity.;Lipids Improve shortness of breath with ADL's;Increase knowledge of respiratory medications and ability to use respiratory devices properly.;Develop more efficient breathing techniques such as purse lipped  breathing and diaphragmatic breathing and practicing self-pacing with activity. Increase knowledge of respiratory medications and ability to use respiratory devices properly.;Develop more efficient breathing techniques such as purse lipped breathing and diaphragmatic breathing and practicing self-pacing with activity.;Lipids   Review Mr Gipe has completed 3 weeks in [facility] and states an improvement with his strength. He has recently started on Mucinex which has improved his sputum production. He has a good understanding of his Albuterol MDI and carries it with him. The spacer he uses at home. His PD's for shortness of breath are 3-4 and does not notice improvement in his breathing, although he does use PLB and adjusts his oxygen to 5l/m at home with activity. Mr Laughner walks at home his driveway with pacing and use of his home oximeter. Unfortunately he uses intermittent flow with the oxygen, so  his O2Sat's do drop in the 80's - encouraged him to rest and pace himself to keep O2Sat's 88% or greater. Mr Avans is still walking his driveway on his days off from [place], although with the cold weather, he has been walking at [facility]. He recently purchased new sneakers for better support for exercise. His breathing has worsened over the last couple of weeks. He physician has ordered an EKG.  Mr Blasingame has had a productive cough, but the sputum has been clear. He does use a flutter valve.  Mr Segreto has progressed his goals on the NS, TM, and AE. He adjusts his oxygen from 4-6l/m with his exercise.  Recently he bought a new pair of sneakers for better support during his exercise. He is using his Albuterol MDI with the aerochamber. His physician has ordered a vibrating vest for mobilization of his secretions. Mr Hornik is compliant with his lipid medication. Mr Compean has returned to [place] after last attending 12/31/16 due to a stroke. Through home therapy, he has improved his stamina  and endurance and returned to [place] with only a few modifications to his exercise goals. He occasionally uses his albuterol with his spacer and uses his vibrating vest . He has good PLB technique and adjusts to oxygen to 4-6l/m. He is also compliant with his other medication, including his Prilosec for his lipids. Mr Zietlow is making progress on his exercise goals - he recently went up on the TM to 0.38mh. He paces himself and adjusts his oxygen to 6l/m for exercise. He continues to use his vibrating vest twice a day and does have a productive cough. When using his Albuterol MDI, he does use the spacer with good technique..Marland KitchenHe likes to walk in the house from his chair to the back door and will exercise 3-4 times a day. He is compliant with his medication for his lipids. I commend Mr [address]ospital [facility] Rdfor his regular attendence and participation in the education.   Expected Outcomes  - Continue exercising to increase his strength and help improve shortness of breath. Contact oxygen company for a larger portable tank. Continue to manage his bronchiectasis with the vibrating vest  and oxygen.  - Continue to progress with his exercise goals and improve his shortness of breath with continued activity.      Nutrition & Weight - Outcomes:    Nutrition:   Nutrition Discharge:   Education Questionnaire Score:   Goals reviewed with patient; copy given to patient.

## 2016-05-13 NOTE — Progress Notes (Signed)
Pulmonary Individual Treatment Plan  Patient Details  Name: [name] MRN: [id number] Date of Birth: 05-18-1928 Referring Provider:     Pulmonary Rehab from 10/27/2015 in [facility] Cardiac and Pulmonary Rehab  Referring Provider  Simonne Maffucci MD      Initial Encounter Date:    Pulmonary Rehab from 10/27/2015 in [facility] Cardiac and Pulmonary Rehab  Date  10/27/15  Referring Provider  Simonne Maffucci MD      Visit Diagnosis: Bronchiectasis without complication ([place])  Chronic respiratory failure with hypoxia ([place])  COPD, mild ([place])  Patient's Home Medications on Admission:  Current Outpatient Prescriptions:  .  acetaminophen (TYLENOL) 325 MG tablet, Take 650 mg by mouth every 6 (six) hours as needed for mild pain. , Disp: , Rfl:  .  albuterol (PROAIR HFA) 108 (90 Base) MCG/ACT inhaler, Inhale 2 puffs into the lungs every 4 (four) hours as needed for wheezing or shortness of breath., Disp: 1 Inhaler, Rfl: 2 .  Cyanocobalamin (VITAMIN B 12 PO), Take by mouth daily., Disp: , Rfl:  .  FIBER PO, Take 1 capsule by mouth 2 (two) times daily., Disp: , Rfl:  .  fluticasone (FLONASE) 50 MCG/ACT nasal spray, Place 2 sprays into both nostrils daily. (Patient taking differently: Place 2 sprays into both nostrils daily as needed for allergies. ), Disp: , Rfl:  .  fluticasone furoate-vilanterol (BREO ELLIPTA) 100-25 MCG/INH AEPB, Inhale 1 puff into the lungs daily., Disp: 1 each, Rfl: 0 .  metoprolol tartrate (LOPRESSOR) 25 MG tablet, Take 0.5 tablets (12.5 mg total) by mouth as needed (for palpitations)., Disp: , Rfl:  .  Multiple Vitamin (MULTIVITAMIN) tablet, Take 1 tablet by mouth daily. Reported on 04/22/2015, Disp: , Rfl:  .  multivitamin-lutein (OCUVITE-LUTEIN) CAPS capsule, Take 1 capsule by mouth daily., Disp: , Rfl:  .  NON FORMULARY, Oxygen 2-5 liters 24/7, Disp: , Rfl:  .  omeprazole (PRILOSEC) 20 MG capsule, Take 20 mg by mouth daily.  , Disp: , Rfl:  .  pravastatin (PRAVACHOL) 40  MG tablet, Take 0.5 tablets (20 mg total) by mouth 2 (two) times a week., Disp: 30 tablet, Rfl: 0 .  Respiratory Therapy Supplies (FLUTTER) DEVI, Use as directed, Disp: 1 each, Rfl: 0 .  rivaroxaban (XARELTO) 20 MG TABS tablet, Take 1 tablet (20 mg total) by mouth daily with supper., Disp: 90 tablet, Rfl: 1 .  tamsulosin (FLOMAX) 0.4 MG CAPS capsule, Take 2 capsules (0.8 mg total) by mouth daily., Disp: 180 capsule, Rfl: 3 .  tiotropium (SPIRIVA) 18 MCG inhalation capsule, Place 1 capsule (18 mcg total) into inhaler and inhale daily., Disp: 30 capsule, Rfl: 6 .  traMADol (ULTRAM) 50 MG tablet, TAKE 1 TABLET BY MOUTH EVERY 12 HOURS AS NEEDED FOR PAIN, Disp: 60 tablet, Rfl: 2 .  traZODone (DESYREL) 100 MG tablet, TAKE 2 TABLETS (200 MG TOTAL) BY MOUTH AT BEDTIME., Disp: 180 tablet, Rfl: 3  Past Medical History: Past Medical History:  Diagnosis Date  . AF (atrial fibrillation) ([place])    after PTX 2012  . Allergy   . Arthritis    h/o R shoulder injection per ortho  . Back pain   . BPH (benign prostatic hyperplasia)   . COPD (chronic obstructive pulmonary disease) ([place])   . Coronary artery disease   . Elevated MCV   . Erectile dysfunction   . GERD (gastroesophageal reflux disease)   . HSV-1 (herpes simplex virus 1) infection   . Hyperlipidemia   . Spontaneous pneumothorax 09/04/2010  [name]    Tobacco Use: History  Smoking Status  . Former Smoker  . Packs/day: 1.00  . Years: 50.00  . Types: Cigarettes  . Quit date: 04/14/1999  Smokeless Tobacco  . Never Used    Labs: Recent Review Flowsheet Data    Labs for ITP Cardiac and Pulmonary Rehab Latest Ref Rng & Units 05/19/2014 09/15/2014 11/26/2015 01/03/2016 01/04/2016   Cholestrol 0 - 200 mg/dL 188 - 199 - 192   LDLCALC 0 - 99 mg/dL 132(H) - 124(H) - 138(H)   LDLDIRECT mg/dL - - - - -   HDL >40 mg/dL 31.50(L) - 40.60 - 36(L)   Trlycerides <150 mg/dL 124.0 - 172.0(H) - 90   Hemoglobin A1c 4.8 - 5.6 % - 5.7 5.4 - 5.4   PHART  7.350 - 7.450 - - - - -   PCO2ART 35.0 - 45.0 mmHg - - - - -   HCO3 20.0 - 24.0 mEq/L - - - - -   TCO2 0 - 100 mmol/L - - - 26 -   ACIDBASEDEF 0.0 - 2.0 mmol/L - - - - -   O2SAT % - - - - -       ADL UCSD:     Pulmonary Assessment Scores    Row Name 12/23/15 1317 12/30/15 0753       ADL UCSD   ADL Phase Mid Mid    SOB Score total 91  -    Rest 1  -    Walk 3  -    Stairs 5  -    Bath 4  -    Dress 4  -    Shop 4  -       Pulmonary Function Assessment:   Exercise Target Goals:    Exercise Program Goal: Individual exercise prescription set with THRR, safety & activity barriers. Participant demonstrates ability to understand and report RPE using BORG scale, to self-measure pulse accurately, and to acknowledge the importance of the exercise prescription.  Exercise Prescription Goal: Starting with aerobic activity 30 plus minutes a day, 3 days per week for initial exercise prescription. Provide home exercise prescription and guidelines that participant acknowledges understanding prior to discharge.  Activity Barriers & Risk Stratification:   6 Minute Walk:     6 Minute Walk    Row Name 12/23/15 1318         6 Minute Walk   Phase Mid Program     Distance 460 feet     Walk Time 4.27 minutes     # of Rest Breaks 5  20 sec, 10 sec, 24 sec, 20 sec, 30 sec     MPH 1.22     METS 1.9     RPE 13     Perceived Dyspnea  4     VO2 Peak 3.36     Symptoms Yes (comment)     Comments SOB     Resting HR 88 bpm     Resting BP 126/70     Max Ex. HR 113 bpm     Max Ex. BP 134/66     2 Minute Post BP 126/64       Interval HR   Baseline HR 88     1 Minute HR 115     2 Minute HR 107     3 Minute HR 112     4 Minute HR 88     5 Minute HR 106     6 Minute  HR 106     2 Minute Post HR 75     Interval Heart Rate? Yes       Interval Oxygen   Interval Oxygen? Yes     Baseline Oxygen Saturation % 93 %     Baseline Liters of Oxygen 5 L  pulsed     1 Minute Oxygen  Saturation % 85 %  84% 1:03-1:23     1 Minute Liters of Oxygen 6 L  continuous     2 Minute Oxygen Saturation % 89 %  rest 2:18-228, 84% 2:51-3:15     2 Minute Liters of Oxygen 6 L     3 Minute Oxygen Saturation % 85 %     3 Minute Liters of Oxygen 8 L     4 Minute Oxygen Saturation % 92 %  82% 3:38-3:58     4 Minute Liters of Oxygen 8 L     5 Minute Oxygen Saturation % 84 %  84% 5:00-5:30     5 Minute Liters of Oxygen 8 L     6 Minute Oxygen Saturation % 93 %     6 Minute Liters of Oxygen 8 L     2 Minute Post Oxygen Saturation % 92 %     2 Minute Post Liters of Oxygen 8 L       Oxygen Initial Assessment:     Oxygen Initial Assessment - 03/28/16 0756      Home Oxygen   Home Oxygen Device Portable Concentrator;Home Concentrator   Sleep Oxygen Prescription Continuous   Liters per minute --  2-5 L/min   Home Exercise Oxygen Prescription Continuous   Liters per minute --  2-5 L/min   Home at Rest Exercise Oxygen Prescription Continuous   Liters per minute --  2-5 L/min   Compliance with Home Oxygen Use Yes     Initial 6 min Walk   Oxygen Used Continuous   Liters per minute 5   Resting Oxygen Saturation  during 6 min walk 93 %   Exercise Oxygen Saturation  during 6 min walk 81 %     Program Oxygen Prescription   Program Oxygen Prescription Continuous   Liters per minute --  4-6 L/min     Intervention   Short Term Goals To learn and exhibit compliance with exercise, home and travel O2 prescription;To learn and understand importance of monitoring SPO2 with pulse oximeter and demonstrate accurate use of the pulse oximeter.;To Learn and understand importance of maintaining oxygen saturations>88%;To learn and demonstrate proper use of respiratory medications;To learn and demonstrate proper purse lipped breathing techniques or other breathing techniques.   Long  Term Goals Exhibits compliance with exercise, home and travel O2 prescription;Maintenance of O2  saturations>88%;Compliance with respiratory medication;Verbalizes importance of monitoring SPO2 with pulse oximeter and return demonstration;Exhibits proper breathing techniques, such as purse lipped breathing or other method taught during program session;Demonstrates proper use of MDI's      Oxygen Re-Evaluation:     Oxygen Re-Evaluation    Row Name 04/05/16 0614             Program Oxygen Prescription   Program Oxygen Prescription Continuous;E-Tanks       Liters per minute 6         Home Oxygen   Home Oxygen Device Home Concentrator;E-Tanks       Sleep Oxygen Prescription Continuous       Liters per minute -  3-6       Home Exercise  Oxygen Prescription Continuous       Liters per minute -  3-6       Home at Rest Exercise Oxygen Prescription Continuous       Liters per minute -  3-6       Compliance with Home Oxygen Use Yes         Goals/Expected Outcomes   Comments Mr [place] continues to have shortness of breath and self- adjusts his oxygen with rest and then increases the oxygen with activities. He admits he has panic attacks and was encouraged to stop the activity and PLB. He still wants to ride mow his yard, but does have to change out his oxygen half way thru the job. Mr [facility] appreciates the oxygen and knows that it enables him to continue walking and other activities.       Goals/Expected Outcomes Continue self-adjusting oxygen flows with activity and use PLB for activity and panic attacks.          Oxygen Discharge (Final Oxygen Re-Evaluation):     Oxygen Re-Evaluation - 04/05/16 0614      Program Oxygen Prescription   Program Oxygen Prescription Continuous;E-Tanks   Liters per minute 6     Home Oxygen   Home Oxygen Device Home Concentrator;E-Tanks   Sleep Oxygen Prescription Continuous   Liters per minute --  3-6   Home Exercise Oxygen Prescription Continuous   Liters per minute --  3-6   Home at Rest Exercise Oxygen Prescription Continuous    Liters per minute --  3-6   Compliance with Home Oxygen Use Yes     Goals/Expected Outcomes   Comments Mr [place] continues to have shortness of breath and self- adjusts his oxygen with rest and then increases the oxygen with activities. He admits he has panic attacks and was encouraged to stop the activity and PLB. He still wants to ride mow his yard, but does have to change out his oxygen half way thru the job. Mr [facility] appreciates the oxygen and knows that it enables him to continue walking and other activities.   Goals/Expected Outcomes Continue self-adjusting oxygen flows with activity and use PLB for activity and panic attacks.      Initial Exercise Prescription:   Perform Capillary Blood Glucose checks as needed.  Exercise Prescription Changes:     Exercise Prescription Changes    Row Name 11/25/15 1500 12/08/15 1500 12/22/15 1500 01/05/16 1500 03/16/16 1500     Response to Exercise   Blood Pressure (Admit) 138/68 152/80 142/70 106/62 128/64   Blood Pressure (Exercise) 128/82 150/76 136/78 130/80 128/66   Blood Pressure (Exit) 138/80 150/82 134/82 122/56 120/70   Heart Rate (Admit) 76 bpm 70 bpm 86 bpm 85 bpm 69 bpm   Heart Rate (Exercise) 108 bpm 102 bpm 125 bpm 98 bpm 92 bpm   Heart Rate (Exit) 107 bpm 92 bpm 90 bpm 90 bpm 75 bpm   Oxygen Saturation (Admit) 92 % 94 % 91 % 93 % 94 %   Oxygen Saturation (Exercise) 89 % 90 % 91 % 89 % 94 %   Oxygen Saturation (Exit) 90 % 98 % 97 % 96 % 98 %   Rating of Perceived Exertion (Exercise) _0 Perceived Dyspnea (Exercise) _1 Symptoms _2    Comments  -  -  - Home Exercise Guidelines given 12/18/15 Second day back from 2 month absence   Duration Progress  to 45 minutes of aerobic exercise without signs/symptoms of physical distress Progress to 45 minutes of aerobic exercise without signs/symptoms of physical distress Progress to 45 minutes of aerobic exercise without signs/symptoms of  physical distress Progress to 45 minutes of aerobic exercise without signs/symptoms of physical distress Progress to 45 minutes of aerobic exercise without signs/symptoms of physical distress   Intensity _0      Progression   Progression Continue to progress workloads to maintain intensity without signs/symptoms of physical distress. Continue to progress workloads to maintain intensity without signs/symptoms of physical distress. Continue to progress workloads to maintain intensity without signs/symptoms of physical distress. Continue to progress workloads to maintain intensity without signs/symptoms of physical distress. Continue to progress workloads to maintain intensity without signs/symptoms of physical distress.   Average METs 1.76 1.63 1.75 1.6 1.6     Resistance Training   Training Prescription _1    Weight 2 lbs 6 lbs 3 lbs 3 lbs 3 lbs   Reps 10-12 10-12 10-12 10-12 10-15     Interval Training   Interval Training _2      Oxygen   Oxygen _3    Liters 4-6  6L on treadmill 4-6  6L on treadmill 4-6  6L on treadmill 4-6  6L on treadmill 4-6  6L on treadmill     Treadmill   MPH 1 0.8 0.8 0.8 0.8   Grade 0 0 0 0 0   Minutes _4 METs 1.77 1.6 1.6 1.6 1.6     NuStep   Level _5 SPM -  60 spm -  65 spm -  61 spm -  88 spm  -   Minutes _6 METs 1.6 1.6 1.8 1.5  -     Arm Ergometer   Level _7 -  -   Watts -  32 rpm -  45 rpm  -  -  -   Minutes _8 -  -   METs 1.9 1.6 1.6  -  -     Biostep-RELP   Level  -  -  -  - 3   Minutes  -  -  -  - 15     Home Exercise Plan   Plans to continue exercise at  -  -  - [facility] (comment)  [facility], Home (walk/treadmill) [facility] (comment)  [facility], Home (walk/treadmill)   Frequency  -  -  - Add 2 additional  days to program exercise sessions. Add 2 additional days to program exercise sessions.   Initial Home Exercises Provided  -  -  -  - 12/18/15     Exercise Review   Progression Yes Yes Yes Yes  -   Row Name 03/29/16 1600 04/13/16 1500 04/27/16 1400         Response to Exercise   Blood Pressure (Admit) 130/62 124/72 122/68     Blood Pressure (Exercise) 140/80 148/74 140/80     Blood Pressure (Exit) 128/70 126/66 122/62     Heart Rate (Admit) 84 bpm 72 bpm 81 bpm     Heart Rate (Exercise) 105 bpm 107 bpm 115 bpm     Heart Rate (Exit) 80 bpm 100 bpm 95 bpm     Oxygen Saturation (  Admit) 91 % 93 % 98 %     Oxygen Saturation (Exercise) 90 % 92 % 90 %     Oxygen Saturation (Exit) 98 % 92 % 95 %     Rating of Perceived Exertion (Exercise) _0 Perceived Dyspnea (Exercise) _1 Symptoms none none none     Duration Progress to 45 minutes of aerobic exercise without signs/symptoms of physical distress Progress to 45 minutes of aerobic exercise without signs/symptoms of physical distress Progress to 45 minutes of aerobic exercise without signs/symptoms of physical distress     Intensity THRR unchanged THRR unchanged THRR unchanged       Progression   Progression Continue to progress workloads to maintain intensity without signs/symptoms of physical distress. Continue to progress workloads to maintain intensity without signs/symptoms of physical distress. Continue to progress workloads to maintain intensity without signs/symptoms of physical distress.     Average METs 1.6 1.9 1.79       Resistance Training   Training Prescription Yes Yes Yes     Weight 3 lbs 2 lbs 3 lbs     Reps 10-15 10-15 10-15       Interval Training   Interval Training No No No       Oxygen   Oxygen Continuous Continuous Continuous     Liters 4-6  6L on treadmill 4-6  6L on treadmill 4-6  6L on treadmill       Treadmill   MPH 0.8 0.9 1     Grade 0 0 0     Minutes _2 min 10 min     METs 1.6  1.6 1.77       NuStep   Level _3 SPM  - 68 57     Minutes _4 METs  - 2 1.6       Biostep-RELP   Level _5 Minutes _6 METs  - 2 2       Home Exercise Plan   Plans to continue exercise at [facility] (comment)  [facility], Home (walk/treadmill) [facility] (comment)  [facility], Home (walk/treadmill) [facility] (comment)  [facility], Home (walk/treadmill)     Frequency Add 2 additional days to program exercise sessions. Add 2 additional days to program exercise sessions. Add 2 additional days to program exercise sessions.     Initial Home Exercises Provided 12/18/15 12/18/15 12/18/15        Exercise Comments:     Exercise Comments    Row Name 11/25/15 1540 12/08/15 1551 12/22/15 1550 01/05/16 1533     Exercise Comments Winfred continues to do well with exercise.  He is starting to become more independent.  We will hold off on home exercise just a little bit longer unitl he is ready.  We will continue to monitor his progression. Winfred is getting more independent with exercise.  He may be ready for home exercise soon.  We will continue to monitor his progression. Winfred is doing well with exercise.  We will continue to monitor for progression. Winfred has been doing well in rehab.  He is up to 88 spm on the NuStep.  We will continue to monitor his progress.       Exercise Goals and Review:     Exercise Goals  [place] Name 03/29/16 1639             Exercise Goals   Increase Physical Activity Yes       Intervention Provide advice, education, support and counseling about physical activity/exercise needs.;Develop an individualized exercise prescription for aerobic and resistive training based on initial evaluation findings, risk stratification, comorbidities and participant's personal goals.       Expected Outcomes Achievement of increased cardiorespiratory fitness and enhanced flexibility, muscular  endurance and strength shown through measurements of functional capacity and personal statement of participant.       Increase Strength and Stamina Yes       Intervention Provide advice, education, support and counseling about physical activity/exercise needs.;Develop an individualized exercise prescription for aerobic and resistive training based on initial evaluation findings, risk stratification, comorbidities and participant's personal goals.       Expected Outcomes Achievement of increased cardiorespiratory fitness and enhanced flexibility, muscular endurance and strength shown through measurements of functional capacity and personal statement of participant.          Exercise Goals Re-Evaluation :     Exercise Goals Re-Evaluation    Row Name 03/16/16 1521 03/29/16 1639 04/13/16 1534 04/27/16 1414 05/11/16 1411     Exercise Goal Re-Evaluation   Exercise Goals Review Increase Physical Activity;Increase Strenth and Stamina Increase Physical Activity;Increase Strenth and Stamina Increase Physical Activity;Increase Strenth and Stamina Increase Physical Activity;Increase Strenth and Stamina Increase Physical Activity;Increase Strenth and Stamina   Comments Winfred has returned after being out with a stroke.  He was able to return with reduced workloads as he had been out for over 2 months.  We will work with returning him back to where he was previously. Winfred is off to a good start with his return to rehab.  He is up to 11 min on the treadmill.  We will continue to work on his progression. Windred is doing well in rehab again!  He has increased his spm on the NuStep and is back up to 15 min on the BioStep and NuStep.  He has also bumped up his treadmill to 0.9 mph.   We will continue to monitor his progression. Winfread has continued to do well.  He has not yet progressed to standing weights yet, but he keeps moving and getting stronger.  He is now up to 1.0 mph on the treadmill and is getting 15  min total.  He has also bumped up his workload on the NuStep to level 5.  We will continue to monitor his progression. Winfred is doing well in rehab.  He is still working his way back up to 45 min of exercise.  He has only attended once since last review.  He did get 15 min on the treadmill!  We will continue to monitor his progression.   Expected Outcomes Short: Winfred will come to exercise to work on strength and stamina.  Long: We will revisit adding back in his home exercise reviewed back in December Short: Winfred will increase his time on the equipment.  Long: Work on IT sales professional. Short: Winfred will continue to try to increase his time on the treadmill. Long: Continue to work on his strength and stamina. Short: Winfred will continue to work on treadmill time.  Long: Continue to work on strength and stamina and add exercise back in at home. Short: Winfred will continue to work on increasing his time on the equipment.  Long: Exericse more independently      Discharge  Exercise Prescription (Final Exercise Prescription Changes):     Exercise Prescription Changes - 04/27/16 1400      Response to Exercise   Blood Pressure (Admit) 122/68   Blood Pressure (Exercise) 140/80   Blood Pressure (Exit) 122/62   Heart Rate (Admit) 81 bpm   Heart Rate (Exercise) 115 bpm   Heart Rate (Exit) 95 bpm   Oxygen Saturation (Admit) 98 %   Oxygen Saturation (Exercise) 90 %   Oxygen Saturation (Exit) 95 %   Rating of Perceived Exertion (Exercise) 13   Perceived Dyspnea (Exercise) 2   Symptoms none   Duration Progress to 45 minutes of aerobic exercise without signs/symptoms of physical distress   Intensity THRR unchanged     Progression   Progression Continue to progress workloads to maintain intensity without signs/symptoms of physical distress.   Average METs 1.79     Resistance Training   Training Prescription Yes   Weight 3 lbs   Reps 10-15     Interval Training   Interval Training No      Oxygen   Oxygen Continuous   Liters 4-6  6L on treadmill     Treadmill   MPH 1   Grade 0   Minutes 15  5 min 10 min   METs 1.77     NuStep   Level 5   SPM 57   Minutes 15   METs 1.6     Biostep-RELP   Level 3   Minutes 15   METs 2     Home Exercise Plan   Plans to continue exercise at [facility] (comment)  [facility], Home (walk/treadmill)   Frequency Add 2 additional days to program exercise sessions.   Initial Home Exercises Provided 12/18/15      Nutrition:  Target Goals: Understanding of nutrition guidelines, daily intake of sodium <1578m, cholesterol <202m calories 30% from fat and 7% or less from saturated fats, daily to have 5 or more servings of fruits and vegetables.  Biometrics:    Nutrition Therapy Plan and Nutrition Goals:   Nutrition Discharge: Rate Your Plate Scores:   Nutrition Goals Re-Evaluation:   Nutrition Goals Discharge (Final Nutrition Goals Re-Evaluation):   Psychosocial: Target Goals: Acknowledge presence or absence of significant depression and/or stress, maximize coping skills, provide positive support system. Participant is able to verbalize types and ability to use techniques and skills needed for reducing stress and depression.   Initial Review & Psychosocial Screening:   Quality of Life Scores:   PHQ-9: Recent Review Flowsheet Data    [facility] 11/26/2015 10/27/2015 07/25/2014 05/22/2014 06/01/2012   Decreased Interest 0 0 0 0 0   Down, Depressed, Hopeless 0 0 0 0 0   PHQ - 2 Score 0 0 0 0 0   Altered sleeping - 0 - - -   Tired, decreased energy - 1 - - -   Change in appetite - 0 - - -   Feeling bad or failure about yourself  - 0 - - -   Trouble concentrating - 0 - - -   Moving slowly or fidgety/restless - 0 - - -   Suicidal thoughts - 0 - - -   PHQ-9 Score - 1 - - -   Difficult doing work/chores - Not difficult at all - - -     Interpretation of Total Score  Total Score  Depression Severity:  1-4 = Minimal depression, 5-9 = Mild depression, 10-14 = Moderate depression, 15-19 =  Moderately severe depression, 20-27 = Severe depression   Psychosocial Evaluation and Intervention:   Psychosocial Re-Evaluation:     Psychosocial Re-Evaluation    [place] Name 12/08/15 1231 12/28/15 1623 03/16/16 1543 04/05/16 0630       Psychosocial Re-Evaluation   Comments See 30 note 12/08/15 [name] psychosocial assessment reveals no barriers at this time to participation in Pulmonary Rehab.  He  has good family and friend support that encourages Khambrel to participate in [place] and progress with His goals.  Cody concerns are monitored, but he  has acknowledge that attending the program has helped to maintain quality life with improved mobility, self-care, and emotional and financial stability.  Tedd is commended for regular attendance and self-motivation to improve His pulmonary disease management. Having been off from [place] for almost 2 months, Mr [facility] returned recently and is doing well. His exercise goals have been modified, but his home therapy and prepared him to return to [facility]. His wife is very supportive and helps him with many tasks. 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 He has a very positive attitude and is commended for his participation in [place].  -    Expected Outcomes  -  -  - Callin psychosocial assessment reveals no barriers at this time to participation in Pulmonary Rehab.  He  has good family and friend support that encourages Khizar to participate in [place] and progress with His goals.  Darrol concerns are monitored, but he  has acknowledge that attending the program has helped to maintain quality life with improved mobility, self-care, and emotional and financial stability.  Muhanad is commended for regular attendance and self-motivation to improve His pulmonary disease management.       Psychosocial  Discharge (Final Psychosocial Re-Evaluation):     Psychosocial Re-Evaluation - 04/05/16 0630      Psychosocial Re-Evaluation   Expected Outcomes [name] psychosocial assessment reveals no barriers at this time to participation in Pulmonary Rehab.  He  has good family and friend support that encourages Noland to participate in [place] and progress with His goals.  Mitchel concerns are monitored, but he  has acknowledge that attending the program has helped to maintain quality life with improved mobility, self-care, and emotional and financial stability.  Samer is commended for regular attendance and self-motivation to improve His pulmonary disease management.      Education: Education Goals: Education classes will be provided on a weekly basis, covering required topics. Participant will state understanding/return demonstration of topics presented.  Learning Barriers/Preferences:   Education Topics: Initial Evaluation Education: - Verbal, written and demonstration of respiratory meds, RPE/PD scales, oximetry and breathing techniques. Instruction on use of nebulizers and MDIs: cleaning and proper use, rinsing mouth with steroid doses and importance of monitoring MDI activations.   Pulmonary Rehab from 04/27/2016 in [facility] Cardiac and Pulmonary Rehab  Date  10/27/15  Educator  LB  Instruction Review Code  2- meets goals/outcomes      General Nutrition Guidelines/Fats and Fiber: -Group instruction provided by verbal, written material, models and posters to present the general guidelines for heart healthy nutrition. Gives an explanation and review of dietary fats and fiber.   Pulmonary Rehab from 04/27/2016 in [facility] Cardiac and Pulmonary Rehab  Date  03/21/16  Educator  CR  Instruction Review Code  2- meets goals/outcomes      Controlling Sodium/Reading Food Labels: -Group verbal and written material supporting the discussion of sodium use in heart healthy nutrition.  Review and explanation  with models, verbal and written materials for utilization of the food label.   Pulmonary Rehab from 04/27/2016 in [facility] Cardiac and Pulmonary Rehab  Date  12/14/15  Educator  CR  Instruction Review Code  2- meets goals/outcomes      Exercise Physiology & Risk Factors: - Group verbal and written instruction with models to review the exercise physiology of the cardiovascular system and associated critical values. Details cardiovascular disease risk factors and the goals associated with each risk factor.   Pulmonary Rehab from 04/27/2016 in [facility] Cardiac and Pulmonary Rehab  Date  12/02/15  Educator  AS  Instruction Review Code  2- meets goals/outcomes      Aerobic Exercise & Resistance Training: - Gives group verbal and written discussion on the health impact of inactivity. On the components of aerobic and resistive training programs and the benefits of this training and how to safely progress through these programs.   Flexibility, Balance, General Exercise Guidelines: - Provides group verbal and written instruction on the benefits of flexibility and balance training programs. Provides general exercise guidelines with specific guidelines to those with heart or lung disease. Demonstration and skill practice provided.   Stress Management: - Provides group verbal and written instruction about the health risks of elevated stress, cause of high stress, and healthy ways to reduce stress.   Pulmonary Rehab from 04/27/2016 in [facility] Cardiac and Pulmonary Rehab  Date  04/20/16  Educator  [facility]  Instruction Review Code  2- meets goals/outcomes      Depression: - Provides group verbal and written instruction on the correlation between heart/lung disease and depressed mood, treatment options, and the stigmas associated with seeking treatment.   Pulmonary Rehab from 04/27/2016 in [facility] Cardiac and Pulmonary Rehab  Date  12/21/15  Educator  [facility]  Instruction Review Code  2- meets goals/outcomes       Exercise & Equipment Safety: - Individual verbal instruction and demonstration of equipment use and safety with use of the equipment.   Infection Prevention: - Provides verbal and written material to individual with discussion of infection control including proper hand washing and proper equipment cleaning during exercise session.   Pulmonary Rehab from 04/27/2016 in [facility] Cardiac and Pulmonary Rehab  Date  10/27/15  Educator  LB  Instruction Review Code  2- meets goals/outcomes      Falls Prevention: - Provides verbal and written material to individual with discussion of falls prevention and safety.   Pulmonary Rehab from 04/27/2016 in [facility] Cardiac and Pulmonary Rehab  Date  10/27/15  Educator  LB  Instruction Review Code  2- meets goals/outcomes      Diabetes: - Individual verbal and written instruction to review signs/symptoms of diabetes, desired ranges of glucose level fasting, after meals and with exercise. Advice that pre and post exercise glucose checks will be done for 3 sessions at entry of program.   Chronic Lung Diseases: - Group verbal and written instruction to review new updates, new respiratory medications, new advancements in procedures and treatments. Provide informative websites and "800" numbers of self-education.   Pulmonary Rehab from 04/27/2016 in [facility] Cardiac and Pulmonary Rehab  Date  04/27/16  Educator  LB  Instruction Review Code  2- meets goals/outcomes      Lung Procedures: - Group verbal and written instruction to describe testing methods done to diagnose lung disease. Review the outcome of test results. Describe the treatment choices: Pulmonary Function Tests, ABGs and oximetry.   Energy Conservation: -  Provide group verbal and written instruction for methods to conserve energy, plan and organize activities. Instruct on pacing techniques, use of adaptive equipment and posture/positioning to relieve shortness of breath.   Pulmonary Rehab from  04/27/2016 in [facility] Cardiac and Pulmonary Rehab  Date  03/16/16  Educator  [facility]  Instruction Review Code  2- meets goals/outcomes      Triggers: - Group verbal and written instruction to review types of environmental controls: home humidity, furnaces, filters, dust mite/pet prevention, HEPA vacuums. To discuss weather changes, air quality and the benefits of nasal washing.   Exacerbations: - Group verbal and written instruction to provide: warning signs, infection symptoms, calling MD promptly, preventive modes, and value of vaccinations. Review: effective airway clearance, coughing and/or vibration techniques. Create an Sports administrator.   Pulmonary Rehab from 04/27/2016 in [facility] Cardiac and Pulmonary Rehab  Date  12/23/15  Educator  LB  Instruction Review Code  2- meets goals/outcomes      Oxygen: - Individual and group verbal and written instruction on oxygen therapy. Includes supplement oxygen, available portable oxygen systems, continuous and intermittent flow rates, oxygen safety, concentrators, and Medicare reimbursement for oxygen.   Respiratory Medications: - Group verbal and written instruction to review medications for lung disease. Drug class, frequency, complications, importance of spacers, rinsing mouth after steroid MDI's, and proper cleaning methods for nebulizers.   Pulmonary Rehab from 04/27/2016 in [facility] Cardiac and Pulmonary Rehab  Date  10/27/15  Educator  LB  Instruction Review Code  2- meets goals/outcomes      AED/CPR: - Group verbal and written instruction with the use of models to demonstrate the basic use of the AED with the basic ABC's of resuscitation.   Breathing Retraining: - Provides individuals verbal and written instruction on purpose, frequency, and proper technique of diaphragmatic breathing and pursed-lipped breathing. Applies individual practice skills.   Pulmonary Rehab from 04/27/2016 in [facility] Cardiac and Pulmonary Rehab  Date  10/27/15  Educator  LB   Instruction Review Code  2- meets goals/outcomes      Anatomy and Physiology of the Lungs: - Group verbal and written instruction with the use of models to provide basic lung anatomy and physiology related to function, structure and complications of lung disease.   Heart Failure: - Group verbal and written instruction on the basics of heart failure: signs/symptoms, treatments, explanation of ejection fraction, enlarged heart and cardiomyopathy.   Sleep Apnea: - Individual verbal and written instruction to review Obstructive Sleep Apnea. Review of risk factors, methods for diagnosing and types of masks and machines for OSA.   Anxiety: - Provides group, verbal and written instruction on the correlation between heart/lung disease and anxiety, treatment options, and management of anxiety.   Pulmonary Rehab from 04/27/2016 in [facility] Cardiac and Pulmonary Rehab  Date  04/20/16  Educator  [facility]  Instruction Review Code  2- Meets goals/outcomes      Relaxation: - Provides group, verbal and written instruction about the benefits of relaxation for patients with heart/lung disease. Also provides patients with examples of relaxation techniques.   Pulmonary Rehab from 04/27/2016 in [facility] Cardiac and Pulmonary Rehab  Date  11/18/15  Educator  [facility]  Instruction Review Code  2- Meets goals/outcomes      Knowledge Questionnaire Score:    Core Components/Risk Factors/Patient Goals at Admission:   Core Components/Risk Factors/Patient Goals Review:      Goals and Risk Factor Review    Row Name 11/23/15 1439 12/08/15 0640 12/28/15 1614 03/16/16 1537  04/05/16 0622     Core Components/Risk Factors/Patient Goals Review   Personal Goals Review Sedentary;Increase Strength and Stamina;Improve shortness of breath with ADL's;Develop more efficient breathing techniques such as purse lipped breathing and diaphragmatic breathing and practicing self-pacing with activity.;Increase knowledge of respiratory  medications and ability to use respiratory devices properly. Sedentary;Increase Strength and Stamina;Improve shortness of breath with ADL's;Increase knowledge of respiratory medications and ability to use respiratory devices properly. Sedentary;Increase Strength and Stamina;Improve shortness of breath with ADL's;Increase knowledge of respiratory medications and ability to use respiratory devices properly.;Develop more efficient breathing techniques such as purse lipped breathing and diaphragmatic breathing and practicing self-pacing with activity.;Lipids Improve shortness of breath with ADL's;Increase knowledge of respiratory medications and ability to use respiratory devices properly.;Develop more efficient breathing techniques such as purse lipped breathing and diaphragmatic breathing and practicing self-pacing with activity. Increase knowledge of respiratory medications and ability to use respiratory devices properly.;Develop more efficient breathing techniques such as purse lipped breathing and diaphragmatic breathing and practicing self-pacing with activity.;Lipids   Review Mr Givler has completed 3 weeks in [facility] and states an improvement with his strength. He has recently started on Mucinex which has improved his sputum production. He has a good understanding of his Albuterol MDI and carries it with him. The spacer he uses at home. His PD's for shortness of breath are 3-4 and does not notice improvement in his breathing, although he does use PLB and adjusts his oxygen to 5l/m at home with activity. Mr Goodenow walks at home his driveway with pacing and use of his home oximeter. Unfortunately he uses intermittent flow with the oxygen, so  his O2Sat's do drop in the 80's - encouraged him to rest and pace himself to keep O2Sat's 88% or greater. Mr Tobler is still walking his driveway on his days off from [place], although with the cold weather, he has been walking at [facility]. He recently purchased  new sneakers for better support for exercise. His breathing has worsened over the last couple of weeks. He physician has ordered an EKG.  Mr Doell has had a productive cough, but the sputum has been clear. He does use a flutter valve.  Mr Faniel has progressed his goals on the NS, TM, and AE. He adjusts his oxygen from 4-6l/m with his exercise.  Recently he bought a new pair of sneakers for better support during his exercise. He is using his Albuterol MDI with the aerochamber. His physician has ordered a vibrating vest for mobilization of his secretions. Mr Aina is compliant with his lipid medication. Mr Sar has returned to [place] after last attending 12/31/16 due to a stroke. Through home therapy, he has improved his stamina and endurance and returned to [place] with only a few modifications to his exercise goals. He occasionally uses his albuterol with his spacer and uses his vibrating vest . He has good PLB technique and adjusts to oxygen to 4-6l/m. He is also compliant with his other medication, including his Prilosec for his lipids. Mr Heinecke is making progress on his exercise goals - he recently went up on the TM to 0.75mh. He paces himself and adjusts his oxygen to 6l/m for exercise. He continues to use his vibrating vest twice a day and does have a productive cough. When using his Albuterol MDI, he does use the spacer with good technique..Marland KitchenHe likes to walk in the house from his chair to the back door and will exercise 3-4 times a day. He is compliant with his  medication for his lipids. I commend Mr [facility] for his regular attendence and participation in the education.   Expected Outcomes  - Continue exercising to increase his strength and help improve shortness of breath. Contact oxygen company for a larger portable tank. Continue to manage his bronchiectasis with the vibrating vest and oxygen.  - Continue to progress with his exercise goals and improve his shortness of breath  with continued activity.      Core Components/Risk Factors/Patient Goals at Discharge (Final Review):      Goals and Risk Factor Review - 04/05/16 0622      Core Components/Risk Factors/Patient Goals Review   Personal Goals Review Increase knowledge of respiratory medications and ability to use respiratory devices properly.;Develop more efficient breathing techniques such as purse lipped breathing and diaphragmatic breathing and practicing self-pacing with activity.;Lipids   Review Mr Muegge is making progress on his exercise goals - he recently went up on the TM to 0.61mh. He paces himself and adjusts his oxygen to 6l/m for exercise. He continues to use his vibrating vest twice a day and does have a productive cough. When using his Albuterol MDI, he does use the spacer with good technique..Marland KitchenHe likes to walk in the house from his chair to the back door and will exercise 3-4 times a day. He is compliant with his medication for his lipids. I commend Mr [facility] regular attendence and participation in the education.   Expected Outcomes Continue to progress with his exercise goals and improve his shortness of breath with continued activity.      ITP Comments:     ITP Comments    Row Name 01/04/16 1609 01/05/16 1532 01/20/16 1450 02/23/16 1325 03/14/16 1534   ITP Comments Ms Rogacki called today. Mr HApollois being tested for a possible stroke on 01/03/16. 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  He was discharged today 01/05/16 with HHPT and possible nuero rehab after that.  We will follow with them next week to determine plan of action for pulmonary rehab. Called Mr Handy to check how he was doing after his strike. He states he is having home therapy and has improved to "almost normal". The chest vest therapy has been helpful which he performs twice daily. He will inform [place] progress and  [place]  Mr HSeider- last attended 12/31/16 due to a stroke. He had his last session of PT last week. He has a procedure coming up,  and the weather has kept him in the house. His wife will call back with more information on [place] Mr Bornhorst was cleared for return to [facility] by Dr MLake Bells    [place] Heights[place] 05/11/16 1417 05/13/16 1138         ITP Comments Called to check on status of return to program, out since last Monday 4/16.  Left message on machine. [facility] to say he re-injured his hip and does not want to return to [facility].  He would like to be discharged.         Comments: Discharge ITP

## 2016-05-15 DIAGNOSIS — G629 Polyneuropathy, unspecified: Secondary | ICD-10-CM | POA: Diagnosis not present

## 2016-05-17 ENCOUNTER — Telehealth: Payer: Self-pay

## 2016-05-17 NOTE — Telephone Encounter (Signed)
PLEASE NOTE: All timestamps contained within this report are represented as [place] Standard Time. CONFIDENTIALTY NOTICE: This fax transmission is intended only for the addressee. It contains information that is legally privileged, confidential or otherwise protected from use or disclosure. If you are not the intended recipient, you are strictly prohibited from reviewing, disclosing, copying using or disseminating any of this information or taking any action in reliance on or regarding this information. If you have received this fax in error, please notify us immediately by telephone so that we can arrange for its return to [place]. Phone: [phone], Toll-Free: [phone], Fax: [phone] Page: 1 of 2 Call Id: [id number] Peck Patient Name: [name] Gender: Male DOB: 1928-04-26 Age: 83 Y 1 M 28 D Return Phone Number: [phone] (Primary), [phone] (Secondary) City/State/Zip: [place] [address] Client [facility] - Client Client Site [facility] Who Is Calling Patient / Member / Family / Caregiver Call Type Triage / [place] Name Amedeo Plenty Relationship To Patient Spouse Return Phone Number [phone] (Primary) Chief Complaint CHEST PAIN (>=21 years) - pain, pressure, heaviness or tightness Reason for Call Symptomatic / Request for Health Information Initial Comment Husband is on oxygen 24/7 and feels coldest in his chest and his heart rate is up Nurse Assessment Nurse: Margaretmary Dys, RN, Hettie Date/Time (Eastern Time): 05/16/2016 7:20:07 PM Confirm and document reason for call. If symptomatic, describe symptoms. ---Husband is on oxygen 24/7 and feels coldest in his chest and his heart rate is up. Feels like he stepped outside in really cold air. O2 at 98%. HR 67. No chest pain. Does the PT have any chronic  conditions? (i.e. diabetes, asthma, etc.) ---Yes List chronic conditions. ---On O2. Guidelines Guideline Title Affirmed Question Disp. Time Eilene Ghazi Time) Disposition Final User 05/16/2016 7:37:28 PM Clinical Call Yes Margaretmary Dys, RN, Hettie Comments User: Chalmers Guest, RN Date/Time Eilene Ghazi Time): 05/16/2016 7:22:59 PM Feels like it may be related to anxiety. Breathing seems a little faster. User: Chalmers Guest, RN Date/Time Eilene Ghazi Time): 05/16/2016 7:26:51 PM Given 1/2 a metoprolol, which has helped anxiety in the past, given 15 min ago. Feels cold in chest. That is causing the anxiety. User: Chalmers Guest, RN Date/Time Eilene Ghazi Time): 05/16/2016 7:31:24 PM Takes Mucinex, uses vest that vibrates to loosen up mucus, [place]. User: Chalmers Guest, RN Date/Time Eilene Ghazi Time): 05/16/2016 7:35:26 PM Coolness in chest is improving. User: Chalmers Guest, RN Date/Time Eilene Ghazi Time): 05/16/2016 7:37:20 PM PLEASE NOTE: All timestamps contained within this report are represented as [place] Standard Time. CONFIDENTIALTY NOTICE: This fax transmission is intended only for the addressee. It contains information that is legally privileged, confidential or otherwise protected from use or disclosure. If you are not the intended recipient, you are strictly prohibited from reviewing, disclosing, copying using or disseminating any of this information or taking any action in reliance on or regarding this information. If you have received this fax in error, please notify us immediately by telephone so that we can arrange for its return to [place]. Phone: [phone], Toll-Free: [phone], Fax: [phone] Page: 2 of 2 Call Id: [id number] Comments triaged on chest pain - all questions negative. Offered assessment on anxiety - spouse declined. Stated she would call back or call 911 if symptoms warranted, but she felt like pt was getting better and pt stated the same - that he was feeling better. No chest pain or  SOB.

## 2016-05-18 ENCOUNTER — Other Ambulatory Visit: Payer: Self-pay

## 2016-05-18 ENCOUNTER — Other Ambulatory Visit (HOSPITAL_COMMUNITY): Payer: Self-pay

## 2016-05-18 ENCOUNTER — Inpatient Hospital Stay (HOSPITAL_COMMUNITY)
Admission: EM | Admit: 2016-05-18 | Discharge: 2016-06-17 | DRG: 871 | Disposition: E | Payer: Medicare Other | Attending: Pulmonary Disease | Admitting: Pulmonary Disease

## 2016-05-18 ENCOUNTER — Emergency Department (HOSPITAL_COMMUNITY): Payer: Medicare Other

## 2016-05-18 ENCOUNTER — Encounter (HOSPITAL_COMMUNITY): Payer: Self-pay | Admitting: Emergency Medicine

## 2016-05-18 DIAGNOSIS — Z515 Encounter for palliative care: Secondary | ICD-10-CM | POA: Diagnosis not present

## 2016-05-18 DIAGNOSIS — R6521 Severe sepsis with septic shock: Secondary | ICD-10-CM | POA: Diagnosis present

## 2016-05-18 DIAGNOSIS — J439 Emphysema, unspecified: Secondary | ICD-10-CM | POA: Diagnosis not present

## 2016-05-18 DIAGNOSIS — Z7901 Long term (current) use of anticoagulants: Secondary | ICD-10-CM

## 2016-05-18 DIAGNOSIS — N390 Urinary tract infection, site not specified: Secondary | ICD-10-CM | POA: Diagnosis not present

## 2016-05-18 DIAGNOSIS — Z79899 Other long term (current) drug therapy: Secondary | ICD-10-CM

## 2016-05-18 DIAGNOSIS — Z66 Do not resuscitate: Secondary | ICD-10-CM | POA: Diagnosis not present

## 2016-05-18 DIAGNOSIS — I4891 Unspecified atrial fibrillation: Secondary | ICD-10-CM | POA: Diagnosis not present

## 2016-05-18 DIAGNOSIS — Z87891 Personal history of nicotine dependence: Secondary | ICD-10-CM

## 2016-05-18 DIAGNOSIS — E875 Hyperkalemia: Secondary | ICD-10-CM | POA: Diagnosis present

## 2016-05-18 DIAGNOSIS — Z8673 Personal history of transient ischemic attack (TIA), and cerebral infarction without residual deficits: Secondary | ICD-10-CM | POA: Diagnosis not present

## 2016-05-18 DIAGNOSIS — Y92009 Unspecified place in unspecified non-institutional (private) residence as the place of occurrence of the external cause: Secondary | ICD-10-CM

## 2016-05-18 DIAGNOSIS — G92 Toxic encephalopathy: Secondary | ICD-10-CM | POA: Diagnosis not present

## 2016-05-18 DIAGNOSIS — R34 Anuria and oliguria: Secondary | ICD-10-CM | POA: Diagnosis not present

## 2016-05-18 DIAGNOSIS — K219 Gastro-esophageal reflux disease without esophagitis: Secondary | ICD-10-CM | POA: Diagnosis present

## 2016-05-18 DIAGNOSIS — J9621 Acute and chronic respiratory failure with hypoxia: Secondary | ICD-10-CM

## 2016-05-18 DIAGNOSIS — T447X1A Poisoning by beta-adrenoreceptor antagonists, accidental (unintentional), initial encounter: Secondary | ICD-10-CM | POA: Diagnosis not present

## 2016-05-18 DIAGNOSIS — R001 Bradycardia, unspecified: Secondary | ICD-10-CM

## 2016-05-18 DIAGNOSIS — Z8249 Family history of ischemic heart disease and other diseases of the circulatory system: Secondary | ICD-10-CM

## 2016-05-18 DIAGNOSIS — E872 Acidosis: Secondary | ICD-10-CM | POA: Diagnosis present

## 2016-05-18 DIAGNOSIS — Z9981 Dependence on supplemental oxygen: Secondary | ICD-10-CM | POA: Diagnosis not present

## 2016-05-18 DIAGNOSIS — J441 Chronic obstructive pulmonary disease with (acute) exacerbation: Secondary | ICD-10-CM | POA: Diagnosis present

## 2016-05-18 DIAGNOSIS — E785 Hyperlipidemia, unspecified: Secondary | ICD-10-CM | POA: Diagnosis not present

## 2016-05-18 DIAGNOSIS — R778 Other specified abnormalities of plasma proteins: Secondary | ICD-10-CM | POA: Diagnosis present

## 2016-05-18 DIAGNOSIS — N179 Acute kidney failure, unspecified: Secondary | ICD-10-CM | POA: Diagnosis present

## 2016-05-18 DIAGNOSIS — I2723 Pulmonary hypertension due to lung diseases and hypoxia: Secondary | ICD-10-CM | POA: Diagnosis present

## 2016-05-18 DIAGNOSIS — R739 Hyperglycemia, unspecified: Secondary | ICD-10-CM | POA: Diagnosis present

## 2016-05-18 DIAGNOSIS — Z823 Family history of stroke: Secondary | ICD-10-CM

## 2016-05-18 DIAGNOSIS — Z833 Family history of diabetes mellitus: Secondary | ICD-10-CM

## 2016-05-18 DIAGNOSIS — J8410 Pulmonary fibrosis, unspecified: Secondary | ICD-10-CM | POA: Diagnosis not present

## 2016-05-18 DIAGNOSIS — N400 Enlarged prostate without lower urinary tract symptoms: Secondary | ICD-10-CM | POA: Diagnosis present

## 2016-05-18 DIAGNOSIS — A419 Sepsis, unspecified organism: Secondary | ICD-10-CM | POA: Diagnosis not present

## 2016-05-18 DIAGNOSIS — I499 Cardiac arrhythmia, unspecified: Secondary | ICD-10-CM | POA: Diagnosis not present

## 2016-05-18 DIAGNOSIS — G9340 Encephalopathy, unspecified: Secondary | ICD-10-CM | POA: Diagnosis not present

## 2016-05-18 DIAGNOSIS — R0902 Hypoxemia: Secondary | ICD-10-CM

## 2016-05-18 DIAGNOSIS — R570 Cardiogenic shock: Secondary | ICD-10-CM | POA: Diagnosis not present

## 2016-05-18 DIAGNOSIS — I2510 Atherosclerotic heart disease of native coronary artery without angina pectoris: Secondary | ICD-10-CM | POA: Diagnosis present

## 2016-05-18 DIAGNOSIS — Z7189 Other specified counseling: Secondary | ICD-10-CM | POA: Diagnosis not present

## 2016-05-18 LAB — BASIC METABOLIC PANEL
Anion gap: 16 — ABNORMAL HIGH (ref 5–15)
BUN: 18 mg/dL (ref 6–20)
CHLORIDE: 105 mmol/L (ref 101–111)
CO2: 19 mmol/L — AB (ref 22–32)
CREATININE: 1.47 mg/dL — AB (ref 0.61–1.24)
Calcium: 8.8 mg/dL — ABNORMAL LOW (ref 8.9–10.3)
GFR calc Af Amer: 48 mL/min — ABNORMAL LOW (ref >60)
GFR calc non Af Amer: 41 mL/min — ABNORMAL LOW (ref >60)
Glucose, Bld: 208 mg/dL — ABNORMAL HIGH (ref 65–99)
POTASSIUM: 4.1 mmol/L (ref 3.5–5.1)
Sodium: 140 mmol/L (ref 135–145)

## 2016-05-18 LAB — COMPREHENSIVE METABOLIC PANEL
ALBUMIN: 3.2 g/dL — AB (ref 3.5–5.0)
ALK PHOS: 69 U/L (ref 38–126)
ALT: 45 U/L (ref 17–63)
AST: 75 U/L — ABNORMAL HIGH (ref 15–41)
Anion gap: 15 (ref 5–15)
BILIRUBIN TOTAL: 0.9 mg/dL (ref 0.3–1.2)
BUN: 18 mg/dL (ref 6–20)
CALCIUM: 8.4 mg/dL — AB (ref 8.9–10.3)
CO2: 19 mmol/L — AB (ref 22–32)
CREATININE: 1.66 mg/dL — AB (ref 0.61–1.24)
Chloride: 104 mmol/L (ref 101–111)
GFR calc Af Amer: 41 mL/min — ABNORMAL LOW (ref >60)
GFR calc non Af Amer: 36 mL/min — ABNORMAL LOW (ref >60)
GLUCOSE: 286 mg/dL — AB (ref 65–99)
Potassium: 4.8 mmol/L (ref 3.5–5.1)
SODIUM: 138 mmol/L (ref 135–145)
TOTAL PROTEIN: 6.7 g/dL (ref 6.5–8.1)

## 2016-05-18 LAB — CBC
HEMATOCRIT: 42.5 % (ref 39.0–52.0)
HEMOGLOBIN: 14 g/dL (ref 13.0–17.0)
MCH: 35.3 pg — AB (ref 26.0–34.0)
MCHC: 32.9 g/dL (ref 30.0–36.0)
MCV: 107.1 fL — AB (ref 78.0–100.0)
PLATELETS: 239 10*3/uL (ref 150–400)
RBC: 3.97 MIL/uL — AB (ref 4.22–5.81)
RDW: 14.1 % (ref 11.5–15.5)
WBC: 16.4 10*3/uL — ABNORMAL HIGH (ref 4.0–10.5)

## 2016-05-18 LAB — I-STAT ARTERIAL BLOOD GAS, ED
ACID-BASE DEFICIT: 16 mmol/L — AB (ref 0.0–2.0)
BICARBONATE: 12.2 mmol/L — AB (ref 20.0–28.0)
O2 SAT: 100 %
PCO2 ART: 36.2 mmHg (ref 32.0–48.0)
PH ART: 7.135 — AB (ref 7.350–7.450)
PO2 ART: 247 mmHg — AB (ref 83.0–108.0)
Patient temperature: 98
TCO2: 13 mmol/L (ref 0–100)

## 2016-05-18 LAB — I-STAT VENOUS BLOOD GAS, ED
Acid-base deficit: 13 mmol/L — ABNORMAL HIGH (ref 0.0–2.0)
BICARBONATE: 17.3 mmol/L — AB (ref 20.0–28.0)
O2 Saturation: 61 %
PCO2 VEN: 56.5 mmHg (ref 44.0–60.0)
PH VEN: 7.094 — AB (ref 7.250–7.430)
PO2 VEN: 43 mmHg (ref 32.0–45.0)
TCO2: 19 mmol/L (ref 0–100)

## 2016-05-18 LAB — I-STAT CG4 LACTIC ACID, ED: LACTIC ACID, VENOUS: 10 mmol/L — AB (ref 0.5–1.9)

## 2016-05-18 LAB — I-STAT TROPONIN, ED: Troponin i, poc: 0.37 ng/mL (ref 0.00–0.08)

## 2016-05-18 LAB — BRAIN NATRIURETIC PEPTIDE: B Natriuretic Peptide: 367.5 pg/mL — ABNORMAL HIGH (ref 0.0–100.0)

## 2016-05-18 MED ORDER — METHYLPREDNISOLONE SODIUM SUCC 125 MG IJ SOLR
INTRAMUSCULAR | Status: AC
  Filled 2016-05-18: qty 2

## 2016-05-18 MED ORDER — METHYLPREDNISOLONE SODIUM SUCC 125 MG IJ SOLR
60.0000 mg | Freq: Four times a day (QID) | INTRAMUSCULAR | Status: DC
  Administered 2016-05-19 (×4): 60 mg via INTRAVENOUS
  Filled 2016-05-18 (×5): qty 2

## 2016-05-18 MED ORDER — MAGNESIUM SULFATE 2 GM/50ML IV SOLN
INTRAVENOUS | Status: AC
  Filled 2016-05-18: qty 50

## 2016-05-18 MED ORDER — PIPERACILLIN-TAZOBACTAM IN DEX 2-0.25 GM/50ML IV SOLN: 2.2500 g | Freq: Once | INTRAVENOUS | Status: DC

## 2016-05-18 MED ORDER — METHYLPREDNISOLONE SODIUM SUCC 125 MG IJ SOLR
125.0000 mg | Freq: Once | INTRAMUSCULAR | Status: AC
  Administered 2016-05-18: 125 mg via INTRAVENOUS

## 2016-05-18 MED ORDER — SODIUM CHLORIDE 0.9 % IV SOLN: 250.0000 mL | INTRAVENOUS | Status: DC | PRN

## 2016-05-18 MED ORDER — GLUCAGON HCL RDNA (DIAGNOSTIC) 1 MG IJ SOLR: 1.0000 mg | Freq: Once | INTRAMUSCULAR | Status: DC | PRN

## 2016-05-18 MED ORDER — HEPARIN SODIUM (PORCINE) 5000 UNIT/ML IJ SOLN: 5000.0000 [IU] | Freq: Three times a day (TID) | INTRAMUSCULAR | Status: DC

## 2016-05-18 MED ORDER — IPRATROPIUM-ALBUTEROL 0.5-2.5 (3) MG/3ML IN SOLN
3.0000 mL | Freq: Four times a day (QID) | RESPIRATORY_TRACT | Status: DC
  Administered 2016-05-19 (×3): 3 mL via RESPIRATORY_TRACT
  Filled 2016-05-18 (×3): qty 3

## 2016-05-18 MED ORDER — ALBUTEROL (5 MG/ML) CONTINUOUS INHALATION SOLN
INHALATION_SOLUTION | RESPIRATORY_TRACT | Status: AC
  Administered 2016-05-18: 22:00:00
  Filled 2016-05-18: qty 20

## 2016-05-18 MED ORDER — PIPERACILLIN-TAZOBACTAM 3.375 G IVPB 30 MIN
3.3750 g | Freq: Once | INTRAVENOUS | Status: AC
  Administered 2016-05-18: 3.375 g via INTRAVENOUS
  Filled 2016-05-18: qty 50

## 2016-05-18 MED ORDER — INSULIN ASPART 100 UNIT/ML ~~LOC~~ SOLN
2.0000 [IU] | SUBCUTANEOUS | Status: DC
  Administered 2016-05-19: 2 [IU] via SUBCUTANEOUS
  Administered 2016-05-19: 4 [IU] via SUBCUTANEOUS
  Administered 2016-05-19: 6 [IU] via SUBCUTANEOUS
  Administered 2016-05-19 (×2): 4 [IU] via SUBCUTANEOUS
  Administered 2016-05-19 – 2016-05-20 (×2): 2 [IU] via SUBCUTANEOUS

## 2016-05-18 MED ORDER — ONDANSETRON HCL 4 MG/2ML IJ SOLN
4.0000 mg | Freq: Once | INTRAMUSCULAR | Status: AC
  Administered 2016-05-19: 4 mg via INTRAVENOUS
  Filled 2016-05-18: qty 2

## 2016-05-18 MED ORDER — VANCOMYCIN HCL 10 G IV SOLR
1500.0000 mg | Freq: Once | INTRAVENOUS | Status: AC
  Administered 2016-05-18: 1500 mg via INTRAVENOUS
  Filled 2016-05-18: qty 1500

## 2016-05-18 MED ORDER — MAGNESIUM SULFATE 2 GM/50ML IV SOLN
2.0000 g | Freq: Once | INTRAVENOUS | Status: AC
  Administered 2016-05-18: 2 g via INTRAVENOUS

## 2016-05-18 MED ORDER — ATROPINE SULFATE 1 MG/ML IJ SOLN
INTRAMUSCULAR | Status: DC | PRN
  Administered 2016-05-18: .5 mg via INTRAVENOUS
  Administered 2016-05-18: 1 mg via INTRAVENOUS

## 2016-05-18 MED ORDER — LORAZEPAM 2 MG/ML IJ SOLN
INTRAMUSCULAR | Status: AC
  Filled 2016-05-18: qty 1

## 2016-05-18 MED ORDER — ATROPINE SULFATE 1 MG/10ML IJ SOSY
PREFILLED_SYRINGE | INTRAMUSCULAR | Status: AC
  Filled 2016-05-18: qty 10

## 2016-05-18 MED ORDER — ALBUTEROL (5 MG/ML) CONTINUOUS INHALATION SOLN
10.0000 mg/h | INHALATION_SOLUTION | RESPIRATORY_TRACT | Status: DC
  Administered 2016-05-18: 10 mg/h via RESPIRATORY_TRACT

## 2016-05-18 MED ORDER — ALBUTEROL SULFATE (2.5 MG/3ML) 0.083% IN NEBU: 2.5000 mg | INHALATION_SOLUTION | RESPIRATORY_TRACT | Status: DC | PRN

## 2016-05-18 MED ORDER — FENTANYL CITRATE (PF) 100 MCG/2ML IJ SOLN
50.0000 ug | Freq: Once | INTRAMUSCULAR | Status: AC
  Administered 2016-05-18: 50 ug via INTRAVENOUS
  Filled 2016-05-18: qty 2

## 2016-05-18 MED ORDER — SODIUM CHLORIDE 0.9 % IV BOLUS (SEPSIS)
30.0000 mL/kg | Freq: Once | INTRAVENOUS | Status: AC
  Administered 2016-05-18: 1000 mL via INTRAVENOUS

## 2016-05-18 NOTE — Telephone Encounter (Signed)
Noted. Thanks.

## 2016-05-18 NOTE — ED Provider Notes (Signed)
[name]   CSN: [id number] Arrival date & time: 05/17/2016  2107     History   Chief Complaint Chief Complaint  Patient presents with  . Bradycardia  . Hypotension  . Excessive Sweating  . Pallor  . Weakness    HPI [name] is a 83 y.o. male.   Weakness  Primary symptoms comment: Generalized weakness. This is a new problem. Episode onset: this evening. The problem has not changed since onset.There was no focality noted. Associated symptoms include shortness of breath. Meds prior to arrival: versed, IVF, external pacing. Associated medical issues include CVA.    Past Medical History:  Diagnosis Date  . AF (atrial fibrillation) ([place])    after PTX 2012  . Allergy   . Arthritis    h/o R shoulder injection per ortho  . Back pain   . BPH (benign prostatic hyperplasia)   . COPD (chronic obstructive pulmonary disease) ([place])   . Coronary artery disease   . Elevated MCV   . Erectile dysfunction   . GERD (gastroesophageal reflux disease)   . HSV-1 (herpes simplex virus 1) infection   . Hyperlipidemia   . Spontaneous pneumothorax 09/04/2010   [name]    Patient Active Problem List   Diagnosis Date Noted  . Pulmonary hypertension ([place]) 02/11/2016  . Pleural effusion 01/15/2016  . Acute embolic stroke ([place]) 31/51/7616  . Stroke (cerebrum) ([place]) 01/03/2016  . Bronchiectasis without acute exacerbation ([place]) 09/25/2015  . Abnormal CBC 07/16/2015  . Excess ear wax 07/16/2015  . Leg ulcer ([place]) 04/30/2015  . Advance care planning 05/23/2014  . Chronic hypoxemic respiratory failure ([place]) 09/05/2013  . Peripheral edema 07/16/2013  . Situational anxiety 07/16/2013  . Chronic anticoagulation- Xarelto since 2012 (PAF) 07/03/2013  . Recurrent spontaneous pneumothorax- s/p VATS 06/29/13 06/27/2013  . Skin lesion 09/13/2012  . Exertional shortness of breath 06/03/2012  . Fall at home 02/19/2012  . Constipation 10/12/2011  . Back pain  07/14/2011  . Urinary frequency 07/14/2011  . Medicare annual wellness visit, subsequent 04/25/2011  . Insomnia 04/25/2011  . PAD (peripheral artery disease) ([place]) 04/15/2011  . Neuropathy 02/15/2011  . ED (erectile dysfunction) 09/28/2010  . PAF (paroxysmal atrial fibrillation)  09/09/2010  . Pneumothorax, left 09/06/2010  . NEOPLASMS UNSPEC NATURE BONE SOFT TISSUE&SKIN 02/10/2010  . MUSCLE CRAMPS 09/01/2009  . NICOTINE ADDICTION 02/06/2008  . UNSPEC DISORDER CARBOHYDRATE TRANSPORT&METAB 01/29/2007  . G E R D 07/28/2006  . HEMORRHOIDS 07/27/2006  . COPD 07/27/2006  . HYPERTHERMIA, MALIGNANT, FH 07/27/2006  . HX, PERSONAL, ALCOHOLISM 07/27/2006  . HERPES SIMPLEX INFECTION, TYPE I 01/17/2006  . GLUCOSE INTOLERANCE 11/18/2002  . ARTHRITIS, CERVICAL SPINE 12/17/2001  . OSTEOPENIA 12/17/2001  . Allergic rhinitis 11/17/2001  . DIVERTICULOSIS, COLON 10/18/1998  . DEPRESSION 04/17/1996  . 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 HLD (hyperlipidemia) 10/17/1985    Past Surgical History:  Procedure Laterality Date  . CATARACT EXTRACTION, BILATERAL    . CHEST TUBE PLACEMENT  09/04/2010  . VIDEO ASSISTED THORACOSCOPY  12/15/2010   Procedure: VIDEO ASSISTED THORACOSCOPY;  Surgeon: Melrose Nakayama, MD;  Location: [place] [place];  Service: Thoracic;  Laterality: Left;  blebectomy with pleural abrasion  . VIDEO ASSISTED THORACOSCOPY Right 06/28/2013   Procedure: VIDEO ASSISTED THORACOSCOPY with stapling of blebs;  Surgeon: Melrose Nakayama, MD;  Location: [place];  Service: Thoracic;  Laterality: Right;  (R) VATS W/BLEBECTOMY       Home Medications    Prior to Admission medications   Medication  Sig Start Date End Date Taking? Authorizing Provider  acetaminophen (TYLENOL) 325 MG tablet Take 650 mg by mouth every 6 (six) hours as needed for mild pain.     Historical Provider, MD  albuterol (PROAIR HFA) 108 (90 Base) MCG/ACT inhaler Inhale 2 puffs into the lungs every 4 (four) hours as needed for  wheezing or shortness of breath. 11/03/15   Juanito Doom, MD  Cyanocobalamin (VITAMIN B 12 PO) Take by mouth daily.    Historical Provider, MD  FIBER PO Take 1 capsule by mouth 2 (two) times daily.    Historical Provider, MD  fluticasone (FLONASE) 50 MCG/ACT nasal spray Place 2 sprays into both nostrils daily. Patient taking differently: Place 2 sprays into both nostrils daily as needed for allergies.  01/29/14   Tonia Ghent, MD  fluticasone furoate-vilanterol (BREO ELLIPTA) 100-25 MCG/INH AEPB Inhale 1 puff into the lungs daily. 05/03/16   Juanito Doom, MD  metoprolol tartrate (LOPRESSOR) 25 MG tablet Take 0.5 tablets (12.5 mg total) by mouth as needed (for palpitations). 09/09/14   Tonia Ghent, MD  Multiple Vitamin (MULTIVITAMIN) tablet Take 1 tablet by mouth daily. Reported on 04/22/2015    Historical Provider, MD  multivitamin-lutein [facility]) CAPS capsule Take 1 capsule by mouth daily.    Historical Provider, MD  NON FORMULARY Oxygen 2-5 liters 24/7    Historical Provider, MD  omeprazole (PRILOSEC) 20 MG capsule Take 20 mg by mouth daily.      Historical Provider, MD  pravastatin (PRAVACHOL) 40 MG tablet Take 0.5 tablets (20 mg total) by mouth 2 (two) times a week. 01/14/16   Tonia Ghent, MD  Respiratory Therapy Supplies (FLUTTER) DEVI Use as directed 09/25/15   Juanito Doom, MD  rivaroxaban (XARELTO) 20 MG TABS tablet Take 1 tablet (20 mg total) by mouth daily with supper. 02/17/16   Tonia Ghent, MD  tamsulosin (FLOMAX) 0.4 MG CAPS capsule Take 2 capsules (0.8 mg total) by mouth daily. 12/24/15   Tonia Ghent, MD  tiotropium (SPIRIVA) 18 MCG inhalation capsule Place 1 capsule (18 mcg total) into inhaler and inhale daily. 06/05/15   Tammy S Parrett, NP  traMADol (ULTRAM) 50 MG tablet TAKE 1 TABLET BY MOUTH EVERY 12 HOURS AS NEEDED FOR PAIN 04/20/16   Tonia Ghent, MD  traZODone (DESYREL) 100 MG tablet TAKE 2 TABLETS (200 MG TOTAL) BY MOUTH AT BEDTIME. 12/24/15    Tonia Ghent, MD    Family History Family History  Problem Relation Age of Onset  . Diabetes Mother   . Peripheral Artery Disease Father   . Allergy (severe) Father     Contrast dye - died from after having an MRI  . Colon cancer Brother     possible dx, not certain  . Hypertension    . Stroke Brother   . Prostate cancer Neg Hx   . Heart attack Neg Hx     Social History Social History  Substance Use Topics  . Smoking status: Former Smoker    Packs/day: 1.00    Years: 50.00    Types: Cigarettes    Quit date: 04/14/1999  . Smokeless tobacco: Never Used  . Alcohol use No     Allergies   Rosuvastatin   Review of Systems Review of Systems  Unable to perform ROS: Acuity of condition  Respiratory: Positive for shortness of breath.   Neurological: Positive for weakness.   Physical Exam Updated Vital Signs BP (!) 83/67  Pulse (!) 59   Temp (!) 96.1 F (35.6 C) (Temporal)   Resp (!) 22   Ht 6' (1.829 m)   Wt 75.8 kg   SpO2 100%   BMI 22.65 kg/m   Physical Exam  Constitutional: He appears well-developed and well-nourished. He appears distressed.  HENT:  Head: Normocephalic and atraumatic.  Eyes: Conjunctivae are normal.  Neck: Neck supple.  Cardiovascular: Regular rhythm.   No murmur heard. Actively being paced on arrival, HR in the 40s when pacer turned off  Pulmonary/Chest: He is in respiratory distress.  Tachypnea, pursed lip breathing, distant breath sounds throughout w/minimal air movement, no wheezing  Abdominal: Soft. There is no tenderness.  Musculoskeletal: He exhibits no edema.  Neurological: He is alert.  Moving all 4ext, answers questions & follows commands  Skin: Skin is dry.  Cool  Psychiatric: He has a normal mood and affect.  Nursing Long and vitals reviewed.   ED Treatments / Results  Labs (all labs ordered are listed, but only abnormal results are displayed) Labs Reviewed  BASIC METABOLIC PANEL - Abnormal; Notable for the  following:       Result Value   CO2 19 (*)    Glucose, Bld 208 (*)    Creatinine, Ser 1.47 (*)    Calcium 8.8 (*)    GFR calc non Af Amer 41 (*)    GFR calc Af Amer 48 (*)    Anion gap 16 (*)    All other components within normal limits  CBC - Abnormal; Notable for the following:    WBC 16.4 (*)    RBC 3.97 (*)    MCV 107.1 (*)    MCH 35.3 (*)    All other components within normal limits  I-STAT TROPOININ, ED - Abnormal; Notable for the following:    Troponin i, poc 0.37 (*)    All other components within normal limits  I-STAT VENOUS BLOOD GAS, ED - Abnormal; Notable for the following:    pH, Ven 7.094 (*)    Bicarbonate 17.3 (*)    Acid-base deficit 13.0 (*)    All other components within normal limits  I-STAT CG4 LACTIC ACID, ED - Abnormal; Notable for the following:    Lactic Acid, Venous 10.00 (*)    All other components within normal limits  I-STAT ARTERIAL BLOOD GAS, ED - Abnormal; Notable for the following:    pH, Arterial 7.135 (*)    pO2, Arterial 247.0 (*)    Bicarbonate 12.2 (*)    Acid-base deficit 16.0 (*)    All other components within normal limits  CULTURE, BLOOD (ROUTINE X 2)  CULTURE, BLOOD (ROUTINE X 2)  URINALYSIS, ROUTINE W REFLEX MICROSCOPIC  BRAIN NATRIURETIC PEPTIDE  COMPREHENSIVE METABOLIC PANEL  CBG MONITORING, ED    EKG  EKG Interpretation None       Radiology Dg Chest Portable 1 View  Result Date: 06/08/2016 CLINICAL DATA:  Respiratory distress with bradycardia EXAM: PORTABLE CHEST 1 VIEW COMPARISON:  01/14/2016 FINDINGS: Fibrosis at the left greater than right lung bases. No consolidation or large pleural effusion. Borderline to mild cardiomegaly with atherosclerosis. Diffuse emphysematous disease. No pneumothorax. IMPRESSION: Emphysematous disease with bibasilar fibrosis. No acute consolidation or edema. Electronically Signed   By: Donavan Foil M.D.   On: 06/12/2016 21:53    Procedures Procedures (including critical care  time)  Medications Ordered in ED Medications  atropine injection (0.5 mg Intravenous Given 05/23/2016 2144)  atropine 1 MG/10ML injection (not administered)  LORazepam (  ATIVAN) 2 MG/ML injection (not administered)  albuterol (PROVENTIL, VENTOLIN) (5 MG/ML) 0.5% continuous inhalation solution (not administered)  methylPREDNISolone sodium succinate (SOLU-MEDROL) 125 mg/2 mL injection (not administered)  atropine 1 MG/10ML injection (not administered)  sodium chloride 0.9 % bolus 2,274 mL (not administered)  vancomycin (VANCOCIN) 1,500 mg in sodium chloride 0.9 % 500 mL IVPB (not administered)  piperacillin-tazobactam (ZOSYN) IVPB 2.25 g (not administered)  methylPREDNISolone sodium succinate (SOLU-MEDROL) 125 mg/2 mL injection 125 mg (125 mg Intravenous Given 05/22/2016 2129)  magnesium sulfate IVPB 2 g 50 mL (0 g Intravenous Stopped 06/10/2016 2154)  fentaNYL (SUBLIMAZE) injection 50 mcg (50 mcg Intravenous Given 06/13/2016 2208)     Initial Impression / Assessment and Plan / ED Course  I have reviewed the triage vital signs and the nursing notes.  Pertinent labs & imaging results that were available during my care of the patient were reviewed by me and considered in my medical decision making (see chart for details).    Pt with h/o CAD, PAD, Afib (on Xarelto), & end-stage COPD presents with bradycardia & respiratory distress. EMS responded to the Pt's house for generalized weakness & SOB; when they arrived the Pt was bradycardic in the 40s & hypotensive so they placed external pacing pads, hung a liter of NS & transported him here. They reported that earlier in the day his wife gave him a full 12.92m tablet of Lopressor for a "fast heart rate"; he normally takes half a tablet PRN for palpitations. Pt in obvious respiratory distress & being paced on arrival.  VS & exam as above. EKG difficult to read, but concerning for STE in the inferior leads. Poor air movement b/l; Solumedrol & albuterol treatments  started on arrival. ABG on arrival: 7.09 / 56.5 / 43 / 17.3.  Gave 0.540matropine for bradycardia w/elevation of HR into the mid 40s. BiPAP started. After 3077m of BiPAP, ABG: 7.131 / 36.7 / 249 / 12.2.  HR again dropped to the 30s, & repeat atropine dose given w/o effect; external pacing resumed.  CXR w/emphysematous changes & no focal consolidations. POC labs remarkable for LA 10.0 & troponin 0.35.  CODE SEPSIS initiated; 30cc/kg bolus, vancomycin & zosyn started.  Pt's grandson (IM PGY-3 @ [employer]) arrived and informed about the Pt's condition & communicated those findings to his family members at the bedside; he will help start a goals of care discussion with the rest of his family members.  Intensivist consulted and evaluated the Pt in the ED; they will admit the Pt to their service for further evaluation and treatment.  Final Clinical Impressions(s) / ED Diagnoses   Final diagnoses:  Bradycardia  Ac[facility] chronic respiratory failure with [facility]  New Prescriptions New Prescriptions   No medications on file     [name] 05/17/2016 235Horace Long, RacWenda Long 05/21/16 [phone]

## 2016-05-18 NOTE — ED Notes (Signed)
RT attempting bi-pap

## 2016-05-18 NOTE — ED Notes (Signed)
NS IV bolus infusing .

## 2016-05-18 NOTE — ED Notes (Signed)
RT at bedside giving albuterol treatment

## 2016-05-18 NOTE — ED Triage Notes (Signed)
Pt presents from home with [facility] for weakness and SOB; EMS reports initial HR and BP low; pacing was started enroute after HR dropped to 39; pts wife reports that 1 metoprolol given when prescription for 0.5 metoprolol for "fast heart rate"; 1 liter NS given and 91m versed; 1021mand 70bpm pacing; pt pale and diaphoretic on ER arrival

## 2016-05-18 NOTE — Progress Notes (Signed)
eLink Physician-Brief Progress Note Patient Name: [name] [facility] DOB: 1928-05-06 MRN: [id number]   Date of Service  05/24/2016  HPI/Events of Note  83 yo with CAD, COPD with low HR with extremal pacer with low BP ER treating COPD exacerbation on biPAP Wife gave full dose of beta blocker, recommend glucagon therapy and infusion if needed, ER does not want to give glucagon due to possibility that it may cause nausea and vomiting    eICU Interventions  PCCM called to evaluate and admit Recommend glucagon therapy for beta blocker toxicity     Intervention Category Evaluation Type: New Patient Evaluation  Flora Lipps 05/26/2016, 11:29 PM

## 2016-05-18 NOTE — ED Notes (Signed)
Dr. Rex Kras spoke with family on admission plan / plan of care to pt.

## 2016-05-19 ENCOUNTER — Other Ambulatory Visit: Payer: Self-pay | Admitting: *Deleted

## 2016-05-19 ENCOUNTER — Encounter (HOSPITAL_COMMUNITY): Payer: Self-pay | Admitting: Emergency Medicine

## 2016-05-19 DIAGNOSIS — J9621 Acute and chronic respiratory failure with hypoxia: Secondary | ICD-10-CM

## 2016-05-19 DIAGNOSIS — G9340 Encephalopathy, unspecified: Secondary | ICD-10-CM

## 2016-05-19 DIAGNOSIS — R001 Bradycardia, unspecified: Secondary | ICD-10-CM

## 2016-05-19 DIAGNOSIS — R570 Cardiogenic shock: Secondary | ICD-10-CM

## 2016-05-19 LAB — BLOOD GAS, ARTERIAL
ACID-BASE DEFICIT: 12.9 mmol/L — AB (ref 0.0–2.0)
BICARBONATE: 13.1 mmol/L — AB (ref 20.0–28.0)
Drawn by: 290171
O2 CONTENT: 5 L/min
O2 Saturation: 86.1 %
PCO2 ART: 32.3 mmHg (ref 32.0–48.0)
PH ART: 7.232 — AB (ref 7.350–7.450)
Patient temperature: 98.6
pO2, Arterial: 60.3 mmHg — ABNORMAL LOW (ref 83.0–108.0)

## 2016-05-19 LAB — RESPIRATORY PANEL BY PCR
Adenovirus: NOT DETECTED
BORDETELLA PERTUSSIS-RVPCR: NOT DETECTED
CORONAVIRUS HKU1-RVPPCR: NOT DETECTED
CORONAVIRUS OC43-RVPPCR: NOT DETECTED
Chlamydophila pneumoniae: NOT DETECTED
Coronavirus 229E: NOT DETECTED
Coronavirus NL63: NOT DETECTED
INFLUENZA B-RVPPCR: NOT DETECTED
Influenza A: NOT DETECTED
METAPNEUMOVIRUS-RVPPCR: NOT DETECTED
Mycoplasma pneumoniae: NOT DETECTED
PARAINFLUENZA VIRUS 1-RVPPCR: NOT DETECTED
PARAINFLUENZA VIRUS 2-RVPPCR: NOT DETECTED
PARAINFLUENZA VIRUS 3-RVPPCR: NOT DETECTED
Parainfluenza Virus 4: NOT DETECTED
RESPIRATORY SYNCYTIAL VIRUS-RVPPCR: NOT DETECTED
RHINOVIRUS / ENTEROVIRUS - RVPPCR: NOT DETECTED

## 2016-05-19 LAB — URINALYSIS, ROUTINE W REFLEX MICROSCOPIC
Bilirubin Urine: NEGATIVE
GLUCOSE, UA: 50 mg/dL — AB
Ketones, ur: 5 mg/dL — AB
Leukocytes, UA: NEGATIVE
Nitrite: NEGATIVE
PROTEIN: 30 mg/dL — AB
SQUAMOUS EPITHELIAL / LPF: NONE SEEN
Specific Gravity, Urine: 1.017 (ref 1.005–1.030)
pH: 5 (ref 5.0–8.0)

## 2016-05-19 LAB — GLUCOSE, CAPILLARY
GLUCOSE-CAPILLARY: 140 mg/dL — AB (ref 65–99)
GLUCOSE-CAPILLARY: 148 mg/dL — AB (ref 65–99)
GLUCOSE-CAPILLARY: 194 mg/dL — AB (ref 65–99)
GLUCOSE-CAPILLARY: 195 mg/dL — AB (ref 65–99)
Glucose-Capillary: 215 mg/dL — ABNORMAL HIGH (ref 65–99)
Glucose-Capillary: 164 mg/dL — ABNORMAL HIGH (ref 65–99)
Glucose-Capillary: 114 mg/dL — ABNORMAL HIGH (ref 65–99)

## 2016-05-19 LAB — INFLUENZA PANEL BY PCR (TYPE A & B)
Influenza A By PCR: NEGATIVE
Influenza B By PCR: NEGATIVE

## 2016-05-19 LAB — MRSA PCR SCREENING: MRSA BY PCR: NEGATIVE

## 2016-05-19 LAB — LACTIC ACID, PLASMA: LACTIC ACID, VENOUS: 7.1 mmol/L — AB (ref 0.5–1.9)

## 2016-05-19 LAB — TROPONIN I: Troponin I: 0.34 ng/mL (ref <0.03)

## 2016-05-19 MED ORDER — GLUCAGON HCL RDNA (DIAGNOSTIC) 1 MG IJ SOLR
3.0000 mg | Freq: Once | INTRAVENOUS | Status: AC
  Administered 2016-05-19: 3 mg via INTRAVENOUS
  Filled 2016-05-19: qty 3

## 2016-05-19 MED ORDER — ORAL CARE MOUTH RINSE
15.0000 mL | Freq: Two times a day (BID) | OROMUCOSAL | Status: DC
  Administered 2016-05-19 (×2): 15 mL via OROMUCOSAL

## 2016-05-19 MED ORDER — SODIUM CHLORIDE 0.9 % IV SOLN
1.0000 g | Freq: Once | INTRAVENOUS | Status: AC
  Administered 2016-05-19: 1 g via INTRAVENOUS
  Filled 2016-05-19: qty 10

## 2016-05-19 MED ORDER — IPRATROPIUM-ALBUTEROL 0.5-2.5 (3) MG/3ML IN SOLN
3.0000 mL | Freq: Three times a day (TID) | RESPIRATORY_TRACT | Status: DC
  Administered 2016-05-19: 3 mL via RESPIRATORY_TRACT
  Filled 2016-05-19: qty 3

## 2016-05-19 MED ORDER — MORPHINE SULFATE (PF) 4 MG/ML IV SOLN
INTRAVENOUS | Status: AC
  Filled 2016-05-19: qty 1

## 2016-05-19 MED ORDER — ARFORMOTEROL TARTRATE 15 MCG/2ML IN NEBU: 15.0000 ug | INHALATION_SOLUTION | Freq: Two times a day (BID) | RESPIRATORY_TRACT | Status: DC

## 2016-05-19 MED ORDER — VANCOMYCIN HCL IN DEXTROSE 1-5 GM/200ML-% IV SOLN: 1000.0000 mg | INTRAVENOUS | Status: DC

## 2016-05-19 MED ORDER — SODIUM CHLORIDE 0.9 % IV SOLN
1.0000 mg/h | INTRAVENOUS | Status: DC
  Administered 2016-05-19: 2 mg/h via INTRAVENOUS
  Filled 2016-05-19: qty 10

## 2016-05-19 MED ORDER — GLUCAGON HCL RDNA (DIAGNOSTIC) 1 MG IJ SOLR
1.0000 mg/h | INTRAVENOUS | Status: DC
  Administered 2016-05-19 (×3): 1 mg/h via INTRAVENOUS
  Filled 2016-05-19 (×4): qty 5

## 2016-05-19 MED ORDER — SODIUM CHLORIDE 0.9 % IV BOLUS (SEPSIS)
500.0000 mL | Freq: Once | INTRAVENOUS | Status: AC
  Administered 2016-05-19: 500 mL via INTRAVENOUS

## 2016-05-19 MED ORDER — CHLORHEXIDINE GLUCONATE 0.12 % MT SOLN
15.0000 mL | Freq: Two times a day (BID) | OROMUCOSAL | Status: DC
  Administered 2016-05-19 – 2016-05-20 (×4): 15 mL via OROMUCOSAL
  Filled 2016-05-19 (×2): qty 15

## 2016-05-19 MED ORDER — PIPERACILLIN-TAZOBACTAM 3.375 G IVPB
3.3750 g | Freq: Three times a day (TID) | INTRAVENOUS | Status: DC
  Administered 2016-05-19 (×3): 3.375 g via INTRAVENOUS
  Filled 2016-05-19 (×5): qty 50

## 2016-05-19 MED ORDER — SODIUM BICARBONATE 8.4 % IV SOLN
INTRAVENOUS | Status: AC
  Administered 2016-05-19: 50 meq
  Filled 2016-05-19: qty 50

## 2016-05-19 MED ORDER — DOPAMINE-DEXTROSE 3.2-5 MG/ML-% IV SOLN
8.0000 ug/kg/min | INTRAVENOUS | Status: DC
  Administered 2016-05-19: 8 ug/kg/min via INTRAVENOUS
  Administered 2016-05-19: 5 ug/kg/min via INTRAVENOUS
  Filled 2016-05-19 (×2): qty 250

## 2016-05-19 MED ORDER — SODIUM CHLORIDE 0.9 % IV BOLUS (SEPSIS)
1000.0000 mL | Freq: Once | INTRAVENOUS | Status: AC
  Administered 2016-05-19: 1000 mL via INTRAVENOUS

## 2016-05-19 MED ORDER — MORPHINE SULFATE (PF) 4 MG/ML IV SOLN
1.0000 mg | Freq: Once | INTRAVENOUS | Status: AC
  Administered 2016-05-19: 1 mg via INTRAVENOUS

## 2016-05-19 MED ORDER — SODIUM CHLORIDE 0.9 % IV SOLN
INTRAVENOUS | Status: DC
  Administered 2016-05-19: 125 mL/h via INTRAVENOUS
  Administered 2016-05-19: 01:00:00 via INTRAVENOUS
  Administered 2016-05-19: 125 mL/h via INTRAVENOUS
  Administered 2016-05-20: 02:00:00 via INTRAVENOUS
  Administered 2016-05-20: 125 mL/h via INTRAVENOUS

## 2016-05-19 MED ORDER — HEPARIN SODIUM (PORCINE) 5000 UNIT/ML IJ SOLN: 5000.0000 [IU] | Freq: Three times a day (TID) | INTRAMUSCULAR | Status: DC

## 2016-05-19 MED ORDER — PANTOPRAZOLE SODIUM 40 MG IV SOLR
40.0000 mg | INTRAVENOUS | Status: DC
  Administered 2016-05-19: 40 mg via INTRAVENOUS
  Filled 2016-05-19 (×2): qty 40

## 2016-05-19 NOTE — Progress Notes (Signed)
RT NOTE:  RT assisted with transport to 2H15. Pt remained off BIPAP during transport. Pt tolerated transport. Report given to [facility], RRT. BIPAP on standby outside room. CCM @ bedside.

## 2016-05-19 NOTE — Progress Notes (Signed)
Progress   Called to patient bedside to readdress goals.   Currently patient hypotensive (systolic 05-39J) and hypoxic.   ABG 7.2/32.3/60.3  Vitals:   05/19/16 0441 05/19/16 0532  BP: (!) 74/44 (!) 78/48  Pulse: (!) 50 92  Resp: (!) 29   Temp:      Family has previously discussed limited desire for pressors. Have allowed low dose peripheral dopamine. Patient now does not wish for BiPAP and has been taking off non-rebreather. Currently is tachypneic with labored breathing. Have further discussed goals of care and making patient comfortable. Family is okay with morphine gtt. Will continue medical therapies at this time without escalation of care.   Hayden Pedro, AG-ACNP [place] [facility]  Pgr: [phone]  PCCM Pgr: [phone]

## 2016-05-19 NOTE — Progress Notes (Signed)
RT NOTE:  Pt titrated off BIPAP to 6L [place] and maintaining @ this time. Pt wanted a break from the BIPAP. RT & RN @ bedside.

## 2016-05-19 NOTE — Progress Notes (Signed)
[place] Progress Note Patient Name: [name] [facility] DOB: 07-31-1928 MRN: [id number]   Date of Service  05/19/2016  HPI/Events of Note  Oliguria - Bladder scan with residual of 250 mL.  eICU Interventions  Will order: 1. I/O cath PRN.      Intervention Category Intermediate Interventions: Oliguria - evaluation and management  [name] 05/19/2016, 10:27 PM

## 2016-05-19 NOTE — Progress Notes (Signed)
Pharmacy Antibiotic Note  [name] is a 83 y.o. male admitted on 06/15/2016 with sepsis.  Pharmacy has been consulted for Vancomycin and Zosyn dosing.  Zosyn 3.375gm IV and Vanc 1.5gm IV given in ED ~2230  Plan: Zosyn 3.375gm IV q8h - doses over 4 hours Vancomycin 1gm IV q24h Will f/u micro data, renal function, and pt's clinical condition Vanc trough prn   Height: 6' (182.9 cm) Weight: 167 lb (75.8 kg) IBW/kg (Calculated) : 77.6  Temp (24hrs), Avg:96.1 F (35.6 C), Min:96.1 F (35.6 C), Max:96.1 F (35.6 C)   Recent Labs Lab 05/24/2016 1921 06/16/2016 2149 06/01/2016 2233  WBC 16.4*  --   --   CREATININE 1.47*  --  1.66*  LATICACIDVEN  --  10.00*  --     Estimated Creatinine Clearance: 34.2 mL/min (A) (by C-G formula based on SCr of 1.66 mg/dL (H)).    Allergies  Allergen Reactions  . Rosuvastatin Other (See Comments)    Myalgia at 58m/day dose    Antimicrobials this admission: 5/2 Vanc >>  5/2 Zosyn >>   Dose adjustments this admission: n/a  Microbiology results: 5/2 BCx x2:   Thank you for allowing pharmacy to be a part of this patient's care.  CSherlon Handing PharmD, BCPS Clinical pharmacist, pager [phone]/03/2016 12:16 AM

## 2016-05-19 NOTE — Progress Notes (Signed)
Pt bladder scan completed. 200-300 ml noted during scan  Pt states he has no urge to void. Will continue to monitor.

## 2016-05-19 NOTE — Progress Notes (Addendum)
Pt sating uppers 80s on 6 L nasal cannula and audibly wheezing.  Scheduled solu-medrol given and bipap offered to family and patient. Family refused at this time.  Family okay with non-rebreather however patient immediately takes off and ask for "other oxygen" back.  Patient agitated and trying to get out of bed. Lose dose fentanyl offered but family refused after explaining drop in blood pressure probable. Family continues to comment about MD rounding and talking with them. E-link MD aware. Will continue to monitor.

## 2016-05-19 NOTE — Progress Notes (Signed)
PULMONARY / CRITICAL CARE MEDICINE   Name: [name] MRN: [id number] DOB: 1928/12/13    ADMISSION DATE:  05/28/2016 CONSULTATION DATE:  05/19/2016  REFERRING MD:  Dr. Ericka Pontiff, EDP  CHIEF COMPLAINT:  Bradycardia   HISTORY OF PRESENT ILLNESS:   83 year old male with PMH of A.Fib (on Xarelto), Chronic Back Pain, End Stage COPD (6L [place] at baseline, followed by [facility]), CAD, GERD, HLD   Patient presented 5/3 from EMS with new onset dyspnea. Wife reports that patient was of normal state of health until about 830 pm. Patient stated that he was having chest tightness in which wife reported that he had an elevated HR so he was given 25 mg Metoprolol which is 2x normal dose. Upon arrival to ED patient was being externally paced and was place/diaphoretic and was placed on BiPAP. HR 33-38V with systolic 29-19T, was given atropine x 2 with improvement into the 40s. CXR with no acute changes, LA 10, WBC 16.4, troponin 0.35. Code sepsis initiated. PCCM asked to admit.   Recent admission 12/17-12/19/17 for possible pontine stroke, placed on daily Xarelto.    SUBJECTIVE:  No longer paced HR 87 on glucagon Dopamine at 10  VITAL SIGNS: BP (!) 86/62 (BP Location: Right Arm)   Pulse 61   Temp 97.3 F (36.3 C) (Axillary)   Resp (!) 23   Ht _0  (1.753 m)   Wt 78.2 kg (172 lb 6.4 oz)   SpO2 100%   BMI 25.46 kg/m   HEMODYNAMICS:    VENTILATOR SETTINGS: FiO2 (%):  [30 %] 30 %  INTAKE / OUTPUT: I/O last 3 completed shifts: In: 4271.3 [I.V.:2998.3; IV Piggyback:1273] Out: -   PHYSICAL EXAMINATION: General: awake, alert, confused Neuro: moves al ext equally, perrl HEENT: jvd wnl PULM: distant CV: s1 s2 NOT brady, RR no r GI: soft, obese, NT, nd Extremities: edema gen   LABS:  BMET  Recent Labs Lab 06/04/2016 1921 05/21/2016 2233  NA 140 138  K 4.1 4.8  CL 105 104  CO2 19* 19*  BUN 18 18  CREATININE 1.47* 1.66*  GLUCOSE 208* 286*    Electrolytes  Recent Labs Lab  05/26/2016 1921 06/12/2016 2233  CALCIUM 8.8* 8.4*    CBC  Recent Labs Lab 05/17/2016 1921  WBC 16.4*  HGB 14.0  HCT 42.5  PLT 239    Coag's No results for input(s): APTT, INR in the last 168 hours.  Sepsis Markers  Recent Labs Lab 06/12/2016 2149 05/19/16 0126  LATICACIDVEN 10.00* 7.1*    ABG  Recent Labs Lab 05/21/2016 2159 05/19/16 0400  PHART 7.135* 7.232*  PCO2ART 36.2 32.3  PO2ART 247.0* 60.3*    Liver Enzymes  Recent Labs Lab 05/17/2016 2233  AST 75*  ALT 45  ALKPHOS 69  BILITOT 0.9  ALBUMIN 3.2*    Cardiac Enzymes  Recent Labs Lab 05/19/16 0126  TROPONINI 0.34*    Glucose  Recent Labs Lab 05/19/16 0038 05/19/16 0340 05/19/16 0853  GLUCAP 194* 215* 195*    Imaging Dg Chest Portable 1 View  Result Date: 06/02/2016 CLINICAL DATA:  Respiratory distress with bradycardia EXAM: PORTABLE CHEST 1 VIEW COMPARISON:  01/14/2016 FINDINGS: Fibrosis at the left greater than right lung bases. No consolidation or large pleural effusion. Borderline to mild cardiomegaly with atherosclerosis. Diffuse emphysematous disease. No pneumothorax. IMPRESSION: Emphysematous disease with bibasilar fibrosis. No acute consolidation or edema. Electronically Signed   By: Donavan Foil M.D.   On: 06/04/2016 21:53  STUDIES:  CXR 5/2 > Emphysematous disease with bibasilar fibrosis, no acute consolidation/edema   CULTURES: Blood 5/2 >>  Urine 5/2 >> RVP 5/2 >> Legionella 5/2>> Step p. 5/2 >>  ANTIBIOTICS: Vancomycin 5/2 >> Zosyn 5/2 >>  SIGNIFICANT EVENTS: 5/2 > Presents to ED  5/3- off pacer, hr 65-87, MAP adaquate  LINES/TUBES: PIV   DISCUSSION: 83 year old male with sudden dyspnea/chest tightness, took extra dose of beta-blocker that was 2x normal dose. Presents with hypotension and bradycardia > beta-blocker OD? Began on glucagon infusion with external pacing. With Elevated WBC and lactic acid, ?Sepsis.    ASSESSMENT / PLAN:  PULMONARY A: Acute on  Chronic Hypoxic Respiratory Failure +/- AECOPD H/O End Stage COPD (6L [place] baseline) Uncompensated met acidosis P:   DNI Maintain Oxygenation >88 Scheduled Brovana and Duoneb  Albuterol PRN  Solu-Medrol 60 mg q6h, maintain  Support MAP with pressors , hence resolve lactic as goal NIMV can again be offerred pcxr in am, no lasix  CARDIOVASCULAR A:  Bradycardia in setting of Beta-Blocker Overdose  Elevated Troponin A.Fib on Xarelto CAD, HLD  P:  Pacer is now off and maintaining HR on own I have a hard time believing that glucagon is required long term based on ingested amount of BB Can continue use dopamine to Beta affect max 8 mic/kg/min through PIV Bolus x 1 again Add empiric stress steroids, family not want cortisol drawn further, when copd roids reduced Could also consider florinef if remains in shock   RENAL A:   Lactic Acidosis   Acute Renal Failure  P:   Trend BMP Pos balance goal saline at 75  GASTROINTESTINAL A:   GERD P:   PPI NPO until resp status improves  HEMATOLOGIC A:   Chronic Anticoagulation in setting of  P:  Trend CBC   INFECTIOUS A:   Sepsis ? No clear sepsis or cause P:   Continue Antibiotics- zosyn Dc vanc Follow cultures  ENDOCRINE A:   Hyperglycemia   glucagin  P:   Trend Glucose   NEUROLOGIC A:   H/O CVA, Insomnia  co2 narcosis P:   RASS goal: 0 Monitor  May offer again nIMV Hold home trazodone   FAMILY  - Updates: Family at beside I updated them   - Inter-disciplinary family meet or Palliative Care meeting due by: 05/26/2016  Ccm time 45 min   Lavon Paganini. Titus Mould, MD, [place] Pgr: [facility]

## 2016-05-19 NOTE — H&P (Signed)
PULMONARY / CRITICAL CARE MEDICINE   Name: [name] MRN: [id number] DOB: [birth year]    ADMISSION DATE:  06/10/2016 CONSULTATION DATE:  05/19/2016  REFERRING MD:  Dr. Ericka Pontiff, EDP  CHIEF COMPLAINT:  Bradycardia   HISTORY OF PRESENT ILLNESS:   83 year old male with PMH of A.Fib (on Xarelto), Chronic Back Pain, End Stage COPD (6L [place] at baseline, followed by [facility]), CAD, GERD, HLD   Patient presented 5/3 from EMS with new onset dyspnea. Wife reports that patient was of normal state of health until about 830 pm. Patient stated that he was having chest tightness in which wife reported that he had an elevated HR so he was given 25 mg Metoprolol which is 2x normal dose. Upon arrival to ED patient was being externally paced and was place/diaphoretic and was placed on BiPAP. HR 36-64Q with systolic 03-47Q, was given atropine x 2 with improvement into the 40s. CXR with no acute changes, LA 10, WBC 16.4, troponin 0.35. Code sepsis initiated. PCCM asked to admit.   Recent admission 12/17-12/19/17 for possible pontine stroke, placed on daily Xarelto.   PAST MEDICAL HISTORY :  He  has a past medical history of AF (atrial fibrillation) ([place]); Allergy; Arthritis; Back pain; BPH (benign prostatic hyperplasia); COPD (chronic obstructive pulmonary disease) ([place]); Coronary artery disease; Elevated MCV; Erectile dysfunction; GERD (gastroesophageal reflux disease); HSV-1 (herpes simplex virus 1) infection; Hyperlipidemia; and Spontaneous pneumothorax (09/04/2010).  PAST SURGICAL HISTORY: He  has a past surgical history that includes CHEST TUBE PLACEMENT (09/04/2010); Video assisted thoracoscopy (12/15/2010); Video assisted thoracoscopy (Right, 06/28/2013); and Cataract extraction, bilateral.  Allergies  Allergen Reactions  . Rosuvastatin Other (See Comments)    Myalgia at 61m/day dose    No current facility-administered medications on file prior to encounter.    Current Outpatient Prescriptions on  File Prior to Encounter  Medication Sig  . acetaminophen (TYLENOL) 325 MG tablet Take 325-650 mg by mouth every 6 (six) hours as needed for mild pain.   .Marland Kitchenalbuterol (PROAIR HFA) 108 (90 Base) MCG/ACT inhaler Inhale 2 puffs into the lungs every 4 (four) hours as needed for wheezing or shortness of breath.  . Cyanocobalamin (VITAMIN B 12 PO) Take 1 tablet by mouth daily.   .Marland KitchenFIBER PO Take 1 capsule by mouth 2 (two) times daily.  . fluticasone (FLONASE) 50 MCG/ACT nasal spray Place 2 sprays into both nostrils daily. (Patient taking differently: Place 2 sprays into both nostrils daily as needed for allergies. )  . fluticasone furoate-vilanterol (BREO ELLIPTA) 100-25 MCG/INH AEPB Inhale 1 puff into the lungs daily.  . metoprolol tartrate (LOPRESSOR) 25 MG tablet Take 0.5 tablets (12.5 mg total) by mouth as needed (for palpitations). (Patient taking differently: Take 12.5 mg by mouth daily as needed (for palpitations). )  . Multiple Vitamin (MULTIVITAMIN) tablet Take 1 tablet by mouth daily. Reported on 04/22/2015  . NON FORMULARY Inhale 4-6 L into the lungs continuous.   .Marland Kitchenomeprazole (PRILOSEC) 20 MG capsule Take 20 mg by mouth daily.    . pravastatin (PRAVACHOL) 40 MG tablet Take 0.5 tablets (20 mg total) by mouth 2 (two) times a week.  .Marland KitchenRespiratory Therapy Supplies (FLUTTER) DEVI Use as directed  . rivaroxaban (XARELTO) 20 MG TABS tablet Take 1 tablet (20 mg total) by mouth daily with supper.  . tamsulosin (FLOMAX) 0.4 MG CAPS capsule Take 2 capsules (0.8 mg total) by mouth daily. (Patient taking differently: Take 0.4 mg by mouth 2 (two) times daily. )  .  tiotropium (SPIRIVA) 18 MCG inhalation capsule Place 1 capsule (18 mcg total) into inhaler and inhale daily.  . traMADol (ULTRAM) 50 MG tablet TAKE 1 TABLET BY MOUTH EVERY 12 HOURS AS NEEDED FOR PAIN (Patient taking differently: Take 50 mg by mouth at bedtime)  . traZODone (DESYREL) 100 MG tablet TAKE 2 TABLETS (200 MG TOTAL) BY MOUTH AT BEDTIME.     FAMILY HISTORY:  His _0 (<SUBSCRIPT> error)@  SOCIAL HISTORY: He  reports that he quit smoking about 17 years ago. His smoking use included Cigarettes. He has a 50.00 pack-year smoking history. He has never used smokeless tobacco. He reports that he does not drink alcohol or use drugs.  REVIEW OF SYSTEMS:   All negative; except for those that are bolded, which indicate positives.  Constitutional: weight loss, weight gain, night sweats, fevers, chills, fatigue, weakness.  HEENT: headaches, sore throat, sneezing, nasal congestion, post nasal drip, difficulty swallowing, tooth/dental problems, visual complaints, visual changes, ear aches. Neuro: difficulty with speech, weakness, numbness, ataxia. CV:  chest pain, orthopnea, PND, swelling in lower extremities, dizziness, palpitations, syncope.  Resp: cough, hemoptysis, dyspnea, wheezing. GI: heartburn, indigestion, abdominal pain, nausea, vomiting, diarrhea, constipation, change in bowel habits, loss of appetite, hematemesis, melena, hematochezia.  GU: dysuria, change in color of urine, urgency or frequency, flank pain, hematuria. MSK: joint pain or swelling, decreased range of motion. Psych: change in mood or affect, depression, anxiety, suicidal ideations, homicidal ideations. Skin: rash, itching, bruising.   SUBJECTIVE:  Remains on external pacer with underlying HR 20-30s, Patient voices pain related to the shocks  VITAL SIGNS: BP (!) 79/36   Pulse (!) 101   Temp (!) 96.1 F (35.6 C) (Temporal)   Resp (!) 0   Ht 6' (1.829 m)   Wt 75.8 kg (167 lb)   SpO2 100%   BMI 22.65 kg/m   HEMODYNAMICS:    VENTILATOR SETTINGS: FiO2 (%):  [30 %] 30 %  INTAKE / OUTPUT: No intake/output data recorded.  PHYSICAL EXAMINATION: General:  Elderly male, moderate respiratory distress  Neuro:  Alert, oriented, follows commands  HEENT:  BIPAP in place  Cardiovascular:  Irregular, Brady, external pacer in place  Lungs:  Diminished  breath sounds, expiratory wheezes, labored  Abdomen:  Non-distended, active bowel sounds Musculoskeletal:  No acute  Skin:  Warm, dry, intact   LABS:  BMET  Recent Labs Lab 05/31/2016 1921 06/16/2016 2233  NA 140 138  K 4.1 4.8  CL 105 104  CO2 19* 19*  BUN 18 18  CREATININE 1.47* 1.66*  GLUCOSE 208* 286*    Electrolytes  Recent Labs Lab 06/10/2016 1921 05/19/2016 2233  CALCIUM 8.8* 8.4*    CBC  Recent Labs Lab 05/26/2016 1921  WBC 16.4*  HGB 14.0  HCT 42.5  PLT 239    Coag's No results for input(s): APTT, INR in the last 168 hours.  Sepsis Markers  Recent Labs Lab 06/01/2016 2149  LATICACIDVEN 10.00*    ABG  Recent Labs Lab 05/25/2016 2159  PHART 7.135*  PCO2ART 36.2  PO2ART 247.0*    Liver Enzymes  Recent Labs Lab 06/05/2016 2233  AST 75*  ALT 45  ALKPHOS 69  BILITOT 0.9  ALBUMIN 3.2*    Cardiac Enzymes No results for input(s): TROPONINI, PROBNP in the last 168 hours.  Glucose No results for input(s): GLUCAP in the last 168 hours.  Imaging Dg Chest Portable 1 View  Result Date: 05/25/2016 CLINICAL DATA:  Respiratory distress with bradycardia EXAM: PORTABLE CHEST 1 VIEW  COMPARISON:  01/14/2016 FINDINGS: Fibrosis at the left greater than right lung bases. No consolidation or large pleural effusion. Borderline to mild cardiomegaly with atherosclerosis. Diffuse emphysematous disease. No pneumothorax. IMPRESSION: Emphysematous disease with bibasilar fibrosis. No acute consolidation or edema. Electronically Signed   By: Donavan Foil M.D.   On: 05/28/2016 21:53     STUDIES:  CXR 5/2 > Emphysematous disease with bibasilar fibrosis, no acute consolidation/edema   CULTURES: Blood 5/2 >>  Urine 5/2 >> RVP 5/2 >> Legionella 5/2>> Step p. 5/2 >>  ANTIBIOTICS: Vancomycin 5/2 >> Zosyn 5/2 >>  SIGNIFICANT EVENTS: 5/2 > Presents to ED   LINES/TUBES: PIV   DISCUSSION: 83 year old male with sudden dyspnea/chest tightness, took extra dose of  beta-blocker that was 2x normal dose. Presents with hypotension and bradycardia > beta-blocker OD? Began on glucagon infusion with external pacing. With Elevated WBC and lactic acid, ?Sepsis.    ASSESSMENT / PLAN:  PULMONARY A: Acute on Chronic Hypoxic Respiratory Failure +/- AECOPD H/O End Stage COPD (6L [place] baseline) P:   DNI Maintain Oxygenation >88 Scheduled Brovana and Duoneb  Albuterol PRN  Solu-Medrol 60 mg q6h  CARDIOVASCULAR A:  Bradycardia in setting of Beta-Blocker Overdose  Elevated Troponin A.Fib on Xarelto CAD, HLD  P:  Cardiology Consulted > recommend dopamine  Currently on external pacer > patient is requiring continuous pacing > underlying heart rate 29-38 Cardiac Monitoring  Dopamine to maintain MAP >65 Glucagon gtt Calcium Gluconate x 1  Trend Troponin  Hold home Lopressor, Pravachol  RENAL A:   Lactic Acidosis - S/P 3L  Acute Renal Failure  P:   Trend BMP Trend Lactic Acid Replace electrolytes as needed  NS @ 125 ml/hr   GASTROINTESTINAL A:   GERD P:   PPI NPO  HEMATOLOGIC A:   Chronic Anticoagulation in setting of  P:  Trend CBC   INFECTIOUS A:   Sepsis ? -Clear Chest Xray, LA 10, WBC 16.4 Leukocytosis  P:   Trend Fever and WBC curve Follow culture data Trend Procal and lactic acid  Continue Antibiotics   ENDOCRINE A:   Hyperglycemia    P:   Trend Glucose  SSI  NEUROLOGIC A:   H/O CVA, Insomnia  P:   RASS goal: 0 Monitor  Hold home trazodone   FAMILY  - Updates: Family at beside discussed goals of care. Patient is a DNR/DNI. However would be okay with peripheral pressor support if needed. >Awaiting arrival of daughter for further discussion of goals of care.   - Inter-disciplinary family meet or Palliative Care meeting due by: 05/26/2016   CC Time : 110 minutes   Hayden Pedro, AG-ACNP [facility]  Pgr: [phone]  PCCM Pgr: [phone]

## 2016-05-19 NOTE — Progress Notes (Signed)
eLink Physician-Brief Progress Note Patient Name: Leighton Brickley [facility] DOB: 03-16-1928 MRN: [id number]   Date of Service  05/19/2016  HPI/Events of Note  Anxiety - Video assessment - patient does not look particularly anxious. Reluctant to schedule Benzodiazepines IV given his end-stage COPD. NPO currently and not able to take a small dose of Xanax. He isn't on chronic benzodiazepines at home.  eICU Interventions  Will continue to monitor the patient at this time.      Intervention Category Minor Interventions: Agitation / anxiety - evaluation and management  Laurelyn Terrero [place] 05/19/2016, 3:29 PM

## 2016-05-20 ENCOUNTER — Inpatient Hospital Stay (HOSPITAL_COMMUNITY): Payer: Medicare Other

## 2016-05-20 DIAGNOSIS — A419 Sepsis, unspecified organism: Principal | ICD-10-CM

## 2016-05-20 DIAGNOSIS — Z515 Encounter for palliative care: Secondary | ICD-10-CM

## 2016-05-20 DIAGNOSIS — Z7189 Other specified counseling: Secondary | ICD-10-CM

## 2016-05-20 DIAGNOSIS — R6521 Severe sepsis with septic shock: Secondary | ICD-10-CM

## 2016-05-20 LAB — BASIC METABOLIC PANEL
ANION GAP: 14 (ref 5–15)
BUN: 46 mg/dL — ABNORMAL HIGH (ref 6–20)
CO2: 14 mmol/L — ABNORMAL LOW (ref 22–32)
Calcium: 7.8 mg/dL — ABNORMAL LOW (ref 8.9–10.3)
Chloride: 112 mmol/L — ABNORMAL HIGH (ref 101–111)
Creatinine, Ser: 3.5 mg/dL — ABNORMAL HIGH (ref 0.61–1.24)
GFR calc Af Amer: 17 mL/min — ABNORMAL LOW (ref >60)
GFR, EST NON AFRICAN AMERICAN: 15 mL/min — AB (ref >60)
GLUCOSE: 147 mg/dL — AB (ref 65–99)
POTASSIUM: 5.2 mmol/L — AB (ref 3.5–5.1)
Sodium: 140 mmol/L (ref 135–145)

## 2016-05-20 LAB — GLUCOSE, CAPILLARY
GLUCOSE-CAPILLARY: 131 mg/dL — AB (ref 65–99)
Glucose-Capillary: 134 mg/dL — ABNORMAL HIGH (ref 65–99)

## 2016-05-20 LAB — STREP PNEUMONIAE URINARY ANTIGEN: Strep Pneumo Urinary Antigen: NEGATIVE

## 2016-05-20 LAB — PROCALCITONIN: PROCALCITONIN: 43.57 ng/mL

## 2016-05-20 MED ORDER — MORPHINE BOLUS VIA INFUSION
2.0000 mg | INTRAVENOUS | Status: DC | PRN
  Filled 2016-05-20: qty 5

## 2016-05-20 MED ORDER — PANTOPRAZOLE SODIUM 40 MG PO TBEC
40.0000 mg | DELAYED_RELEASE_TABLET | Freq: Every day | ORAL | Status: DC
Start: 2016-05-20 — End: 2016-05-20

## 2016-05-20 MED ORDER — GLYCOPYRROLATE 0.2 MG/ML IJ SOLN: 0.2000 mg | INTRAMUSCULAR | Status: DC | PRN

## 2016-05-20 MED ORDER — PANTOPRAZOLE SODIUM 40 MG IV SOLR
40.0000 mg | INTRAVENOUS | Status: DC
  Administered 2016-05-20: 40 mg via INTRAVENOUS

## 2016-05-20 MED ORDER — METHYLPREDNISOLONE SODIUM SUCC 40 MG IJ SOLR: 40.0000 mg | Freq: Two times a day (BID) | INTRAMUSCULAR | Status: DC

## 2016-05-20 MED ORDER — CALCIUM CARBONATE ANTACID 500 MG PO CHEW: 1.0000 | CHEWABLE_TABLET | Freq: Three times a day (TID) | ORAL | Status: DC | PRN

## 2016-05-20 MED ORDER — LORAZEPAM 2 MG/ML IJ SOLN: 1.0000 mg | INTRAMUSCULAR | Status: DC | PRN

## 2016-05-21 LAB — LEGIONELLA PNEUMOPHILA SEROGP 1 UR AG: L. PNEUMOPHILA SEROGP 1 UR AG: NEGATIVE

## 2016-05-21 LAB — URINE CULTURE: Culture: NO GROWTH

## 2016-05-22 ENCOUNTER — Telehealth: Payer: Self-pay | Admitting: Family Medicine

## 2016-05-22 ENCOUNTER — Other Ambulatory Visit: Payer: Self-pay | Admitting: Family Medicine

## 2016-05-22 NOTE — Telephone Encounter (Signed)
Called to offer condolences.  Granddaughter picked up the phone.  She said she would pass my message along.  Was always glad to see this kind gentleman in the office.

## 2016-05-24 ENCOUNTER — Ambulatory Visit: Payer: Medicare Other | Admitting: Adult Health

## 2016-05-24 LAB — CULTURE, BLOOD (ROUTINE X 2)
Culture: NO GROWTH
Culture: NO GROWTH
SPECIAL REQUESTS: ADEQUATE
SPECIAL REQUESTS: ADEQUATE

## 2016-05-26 ENCOUNTER — Telehealth: Payer: Self-pay

## 2016-05-26 NOTE — Telephone Encounter (Signed)
On 05/26/16 I received a death certificate from [facility] (original). The death certificate is for burial. The patient is a patient of Doctor Sood. The death certificate will be taken to Pulmonary Unit @ [facility] this am for signature. On 2016/06/10 I received the death certificate back from [facility] [place]. I got the death certificate ready and called the funeral home to let them know I mailed the death certificate to [employer] per the funeral home request.

## 2016-06-17 NOTE — Discharge Summary (Signed)
[name] was a 83 y.o. male former smoker admitted on 05/19/2016 with shortness of breath, chest tightness, and bradycardia.  He was found to have hypotension, elevated lactic acid, and elevated procalcitonin.  He developed renal failure.  He was started on antibiotics, fluids, and pressors.  Family decided for DNR status.  Palliative care was consulted.  Decision made for comfort measures.  He subsequently expired on 05-24-2016.  Final diagnoses: Septic shock likely from urine tract infection Symptomatic bradycardia Acute renal failure Metabolic acidosis Lactic acidosis Hyperkalemia History of A fib on xarelto History of CAD, CVA Severe COPD with emphysema Acute on chronic hypoxic respiratory failure WHO Group 3 pulmonary hypertension  Chesley Mires, MD [facility] Pulmonary/Critical Care 05/23/2016, 5:15 PM

## 2016-06-17 NOTE — Progress Notes (Signed)
Family refusing to allow for morning labs to be drawn as patient is resting. Labs rescheduled for 0830 and will attempt at that time. Day shift RN aware.

## 2016-06-17 NOTE — Progress Notes (Signed)
No respiratory or cardiac sounds noted while auscultating for one minute with Venia Carbon RN. Pt belongings returned. Support given to family and CDS called with time of death.

## 2016-06-17 NOTE — Progress Notes (Signed)
PCCM Progress Note  Admission date: 06/05/2016 Referring provider: Dr. Ericka Pontiff, ER  CC: Bradycardia  HPI: 83 yo presented with dyspnea and bradycardia with chest tightness.  Noted to have low BP, elevated lactic acid, elevate procalcitonin.  PMHx of A fib on xarelto, severe COPD/emphysema on 4 liters oxygen, CAD, HLD, GERD, back pain.  Subjective: c/o indigistion.  Wants some ice cream.  Vital signs: BP 102/69   Pulse 69   Temp 98.2 F (36.8 C) (Axillary)   Resp 13   Ht _0  (1.753 m)   Wt 172 lb 6.4 oz (78.2 kg)   SpO2 100%   BMI 25.46 kg/m   Intake/output: I/O last 3 completed shifts: In: 7601.7 [I.V.:6228.7; IV Piggyback:1373] Out: 300 [Urine:300]  General: alert Neuro: follows simple commands HEENT: dry mucosa Cardiac: irregular Chest: no wheeze Abd: soft, non tender Ext: no edema Skin: no rashes   CMP Latest Ref Rng & Units 06-05-2016 06/15/2016 05/27/2016  Glucose 65 - 99 mg/dL 147(H) 286(H) 208(H)  BUN 6 - 20 mg/dL 46(H) 18 18  Creatinine 0.61 - 1.24 mg/dL 3.50(H) 1.66(H) 1.47(H)  Sodium 135 - 145 mmol/L 140 138 140  Potassium 3.5 - 5.1 mmol/L 5.2(H) 4.8 4.1  Chloride 101 - 111 mmol/L 112(H) 104 105  CO2 22 - 32 mmol/L 14(L) 19(L) 19(L)  Calcium 8.9 - 10.3 mg/dL 7.8(L) 8.4(L) 8.8(L)  Total Protein 6.5 - 8.1 g/dL - 6.7 -  Total Bilirubin 0.3 - 1.2 mg/dL - 0.9 -  Alkaline Phos 38 - 126 U/L - 69 -  AST 15 - 41 U/L - 75(H) -  ALT 17 - 63 U/L - 45 -     CBC Latest Ref Rng & Units 06/01/2016 01/04/2016 01/03/2016  WBC 4.0 - 10.5 K/uL 16.4(H) 11.0(H) -  Hemoglobin 13.0 - 17.0 g/dL 14.0 12.7(L) 12.6(L)  Hematocrit 39.0 - 52.0 % 42.5 36.7(L) 37.0(L)  Platelets 150 - 400 K/uL 239 267 -     ABG    Component Value Date/Time   PHART 7.232 (L) 05/19/2016 0400   PCO2ART 32.3 05/19/2016 0400   PO2ART 60.3 (L) 05/19/2016 0400   HCO3 13.1 (L) 05/19/2016 0400   TCO2 13 05/25/2016 2159   ACIDBASEDEF 12.9 (H) 05/19/2016 0400   O2SAT 86.1 05/19/2016 0400     CBG  (last 3)   Recent Labs  05/19/16 1933 05/19/16 2344 06-05-16 0350  GLUCAP 140* 148* 131*     Imaging: Dg Chest Portable 1 View  Result Date: 06/08/2016 CLINICAL DATA:  Respiratory distress with bradycardia EXAM: PORTABLE CHEST 1 VIEW COMPARISON:  01/14/2016 FINDINGS: Fibrosis at the left greater than right lung bases. No consolidation or large pleural effusion. Borderline to mild cardiomegaly with atherosclerosis. Diffuse emphysematous disease. No pneumothorax. IMPRESSION: Emphysematous disease with bibasilar fibrosis. No acute consolidation or edema. Electronically Signed   By: Donavan Foil M.D.   On: 05/28/2016 21:53     Studies: PFT 09/11/15 >> FEV1 2.07 (86%), FEV1% 58, TLC 6.09 (91%), DLCO 22% Echo 12/17/15 >> mild LVH, EF 60 to 65%, grade 1 DD, mild MR, PAS 42 mmHg  Antibiotics: Vancomycin 5/02 >> Zosyn 5/02 >>  Cultures: Blood 5/02 >> RSV 5/03 >> negative Urine 5/03 >.  Lines/tubes:  Events: 5/02 Admit 5/03 Pacer off  Summary: 83 yo male with septic shock, symtomatic bradycardia.  Assessment/plan:  Septic shock. - source unclear - day 3 of Abx  Bradycardia. Hx of A fib, CAD, HLD, CVA. - max dose of dopamine at 8 mcg >> no  escalation  Acute renal failure. Metabolic acidosis. Hyperkalemia. Lactic acidosis. - not a candidate for HD due to comorbid conditions - monitor hemodynamics  Severe COPD with emphysema. Acute on chronic hypoxic respiratory failure. WHO group 3 pulmonary hypertension. - oxygen to keep SpO2 > 88% - scheduled BDs - change solumedrol to 40 mg bid  Hx of GERD. - protonix  Pain control. - continue morphine  DVT prophylaxis - SQ heparin SUP - protonix Nutrition - full liquid diet for comfort Goals of care - DNR.    Updated pt's family at bedside on 5/04.  They understand that he is critically ill and might not survive.  They are okay with continuing current therapies, but would not want escalation of care.  If his medical  status declines, then plan would be to transition to comfort measures.  CC time 36 minutes  Chesley Mires, MD [place] Jun 15, 2016, 9:06 AM Pager:  [phone] After 3pm call: [phone]

## 2016-06-17 NOTE — Consult Note (Signed)
Consultation Note Date: 06-17-2016   Patient Name: [name]  DOB: 12/22/1928  MRN: [id number]  Age / Sex: 83 y.o., male  PCP: Tonia Ghent, MD Referring Physician: Javier Glazier, MD  Reason for Consultation: Establishing goals of care, Psychosocial/spiritual support and Terminal Care  HPI/Patient Profile: 83 y.o. male  with past medical history of chronic hypoxic respiratory failure, COPD with emphysema nearing end-stage, atrial fibrillation on chronic anticoagulation, and coronary artery disease admitted on 05/23/2016 with symptomatic bradycardia requiring dopamine infusion with septic shock, worsening renal failure. He continues to decline and palliative care requested to assist with comfort care at EOL as well as psychosocial support.   Clinical Assessment and Goals of Care: I met today at [name]'s bedside along with his wife, sons, and their families. They have just had a visit with his close friend and a pastor when I come in. His wife is very overwhelmed and they are appropriately tearful and saddened by his decline. They all do understand that he is at the end of his life. His wife is very clear that his comfort is priority.   We discussed comfort and what this means. She has decided to maintain current rate of dopamine infusion until it becomes a discomfort. I strongly recommended that if he becomes unresponsive to stop the dopamine as this may prolong his suffering. They agree with any measures that will add to his comfort at this time.   [name] arouses and talks but I do not believe that he understands our conversation and he does not answer my questions. He did ask his son to pray and he does repeat "Thank you God, thank you God" and talks about daily blessings.   I provided emotional support to patient and family at bedside but left them to spend this sacred time with [name]. Will be available for emotional support and any needs to achieve comfort.   Primary Decision Maker NEXT OF KIN wife    SUMMARY OF RECOMMENDATIONS   - DNR - Comfort care PRIORITY over all else - Maintain current dopamine (as IV access allows)  - May consider SQ infusion if loss of IV access for morphine (or even transition to dilaudid infusion) - NO ESCALATION OF CARE  Code Status/Advance Care Planning:  DNR   Symptom Management:   Continue morphine infusion, prn bolus added. If loss of IV access could utilize SQ infusion if needed and would prefer infusion of dilaudid given renal function and concentration of medication. Monitor closely for myoclonus effect from morphine given poor renal function - if this occurs transition OFF morphine and to fentanyl/dilaudid infusion.   Ativan prn anxiety/agitation.   Robinul prn secretions.   Palliative Prophylaxis:   Delirium Protocol, Frequent Pain Assessment, Oral Care and Turn Reposition  Additional Recommendations (Limitations, Scope, Preferences):  Full Comfort Care  Psycho-social/Spiritual:   Desire for further Chaplaincy support:yes  Additional Recommendations: Caregiving  Support/Resources and Grief/Bereavement Support  Prognosis:   Hours - Days  Discharge Planning: Anticipated Hospital  Death      Primary Diagnoses: Present on Admission: . Acute on chronic respiratory failure with hypoxia ([place])   I have reviewed the medical record, interviewed the patient and family, and examined the patient. The following aspects are pertinent.  Past Medical History:  Diagnosis Date  . AF (atrial fibrillation) ([place])    after PTX 2012  . Allergy   . Arthritis    h/o R shoulder injection per ortho  . Back pain   . BPH (benign prostatic hyperplasia)   . COPD (chronic obstructive pulmonary disease) ([place])   . Coronary artery disease   . Elevated MCV   . Erectile dysfunction   . GERD (gastroesophageal reflux  disease)   . HSV-1 (herpes simplex virus 1) infection   . Hyperlipidemia   . Spontaneous pneumothorax 09/04/2010   [name]   Social History   Social History  . Marital status: Married    Spouse name: N/A  . Number of children: N/A  . Years of education: N/A   Social History Main Topics  . Smoking status: Former Smoker    Packs/day: 1.00    Years: 50.00    Types: Cigarettes    Quit date: 04/14/1999  . Smokeless tobacco: Never Used  . Alcohol use No  . Drug use: No  . Sexual activity: Yes    Birth control/ protection: None   Other Topics Concern  . None   Social History Narrative   Occupation:Retired [employer]   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 Diabetes Mother   . Peripheral Artery Disease Father   . Allergy (severe) Father     Contrast dye - died from after having an MRI  . Colon cancer Brother     possible dx, not certain  . Hypertension    . Stroke Brother   . Prostate cancer Neg Hx   . Heart attack Neg Hx    Scheduled Meds: . chlorhexidine  15 mL Mouth Rinse BID  . heparin  5,000 Units Subcutaneous Q8H  . ipratropium-albuterol  3 mL Nebulization TID  . mouth rinse  15 mL Mouth Rinse q12n4p  . methylPREDNISolone (SOLU-MEDROL) injection  40 mg Intravenous Q12H  . pantoprazole (PROTONIX) IV  40 mg Intravenous Q24H   Continuous Infusions: . sodium chloride 125 mL/hr at 2016/05/22 1200  . DOPamine 7.98 mcg/kg/min (May 22, 2016 1200)  . morphine 5 mg/hr (2016-05-22 1200)  . piperacillin-tazobactam (ZOSYN)  IV Stopped (May 22, 2016 0110)   PRN Meds:.albuterol, calcium carbonate Allergies  Allergen Reactions  . Rosuvastatin Other (See Comments)    Myalgia at 44m/day dose   Review of Systems  Unable to perform ROS: Acuity of condition    Physical Exam  Constitutional: He appears well-developed. He appears lethargic. He appears ill.  Pallor  HENT:  Head:  Normocephalic and atraumatic.  Cardiovascular: Bradycardia present.   Pulmonary/Chest: Effort normal. No accessory muscle usage. No tachypnea. No respiratory distress.  Abdominal: Normal appearance.  Neurological: He appears lethargic. He is disoriented.  Unable to answer questions. Repeats "I am blessed" and "Thank you God"  Nursing note and vitals reviewed.   Vital Signs: BP (!) 69/45   Pulse (!) 44   Temp 98.2 F (36.8 C) (Axillary)   Resp (!) 21   Ht _0  (1.753 m)   Wt 78.2 kg (172 lb 6.4 oz)   SpO2 100%  BMI 25.46 kg/m  Pain Assessment: CPOT POSS *See Group Information*: 1-Acceptable,Awake and alert Pain Score: Asleep   SpO2: SpO2: 100 % O2 Device:SpO2: 100 % O2 Flow Rate: .O2 Flow Rate (L/min): 8 L/min  IO: Intake/output summary:  Intake/Output Summary (Last 24 hours) at 06/10/16 1325 Last data filed at 06-10-2016 1200  Gross per 24 hour  Intake          4008.75 ml  Output              300 ml  Net          3708.75 ml    LBM: Last BM Date:  (PTA) Baseline Weight: Weight: 75.8 kg (167 lb) Most recent weight: Weight: 78.2 kg (172 lb 6.4 oz)     Palliative Assessment/Data: PPS: 10%   Flowsheet Rows     Most Recent Value  Intake Tab  Referral Department  Critical care  Unit at Time of Referral  ICU  Palliative Care Primary Diagnosis  Pulmonary  Date Notified  Jun 10, 2016  Palliative Care Type  New Palliative care  Reason for referral  Clarify Goals of Care  Date of Admission  06/16/2016  # of days IP prior to Palliative referral  2  Clinical Assessment  Psychosocial & Spiritual Assessment  Palliative Care Outcomes       Time Total: 106mn  Greater than 50%  of this time was spent counseling and coordinating care related to the above assessment and plan.  Signed by: AVinie Sill NP Palliative Medicine Team Pager # [phone](M-F 8a-5p) Team Phone # [phone](Nights/Weekends)

## 2016-06-17 NOTE — Progress Notes (Signed)
175 ml Morphine WIS with Kathleen Argue RN.

## 2016-06-17 NOTE — Progress Notes (Addendum)
Around 0500 patient agitated and needing to use the restroom. Assisted using the urinal. During assistance patient slowly became unresponsive, staring blankly ahead, HR dropped from a.fib in the 70s to bradycardic in the 30s-40s with occasional wide ventricular complex rhythms. Blood pressure dropped to as low as 40/20. Dopamine increased back to 10 mcg. Family at bedside and confirmed would not want to pace patient again. Patient has palpable carotid pulse, but still unresponsive, staring straight ahead with a GCS of 6. Patient breathing comfortably and does not appear to be in any distress on continuous Morphine drip at 3 mg. More family arrived at bedside. HR anywhere from 40s-70s now with a blood pressure systolic [id number] however patient continues to only turn his head towards voices and does not follow commands as previously done before. Emotional support given to patient's family. Dr. Elsworth Soho at [facility] made aware of patient change in patient condition.

## 2016-06-17 DEATH — deceased

## 2017-02-06 IMAGING — DX DG CHEST 2V
3 series · 3 of 3 positions shown · non-contrast
Comparison: 06/05/2015.  Chest CT 09/17/2015

CLINICAL DATA: Shortness of breath for a few days.

EXAM:
CHEST  2 VIEW

[w chest pa]
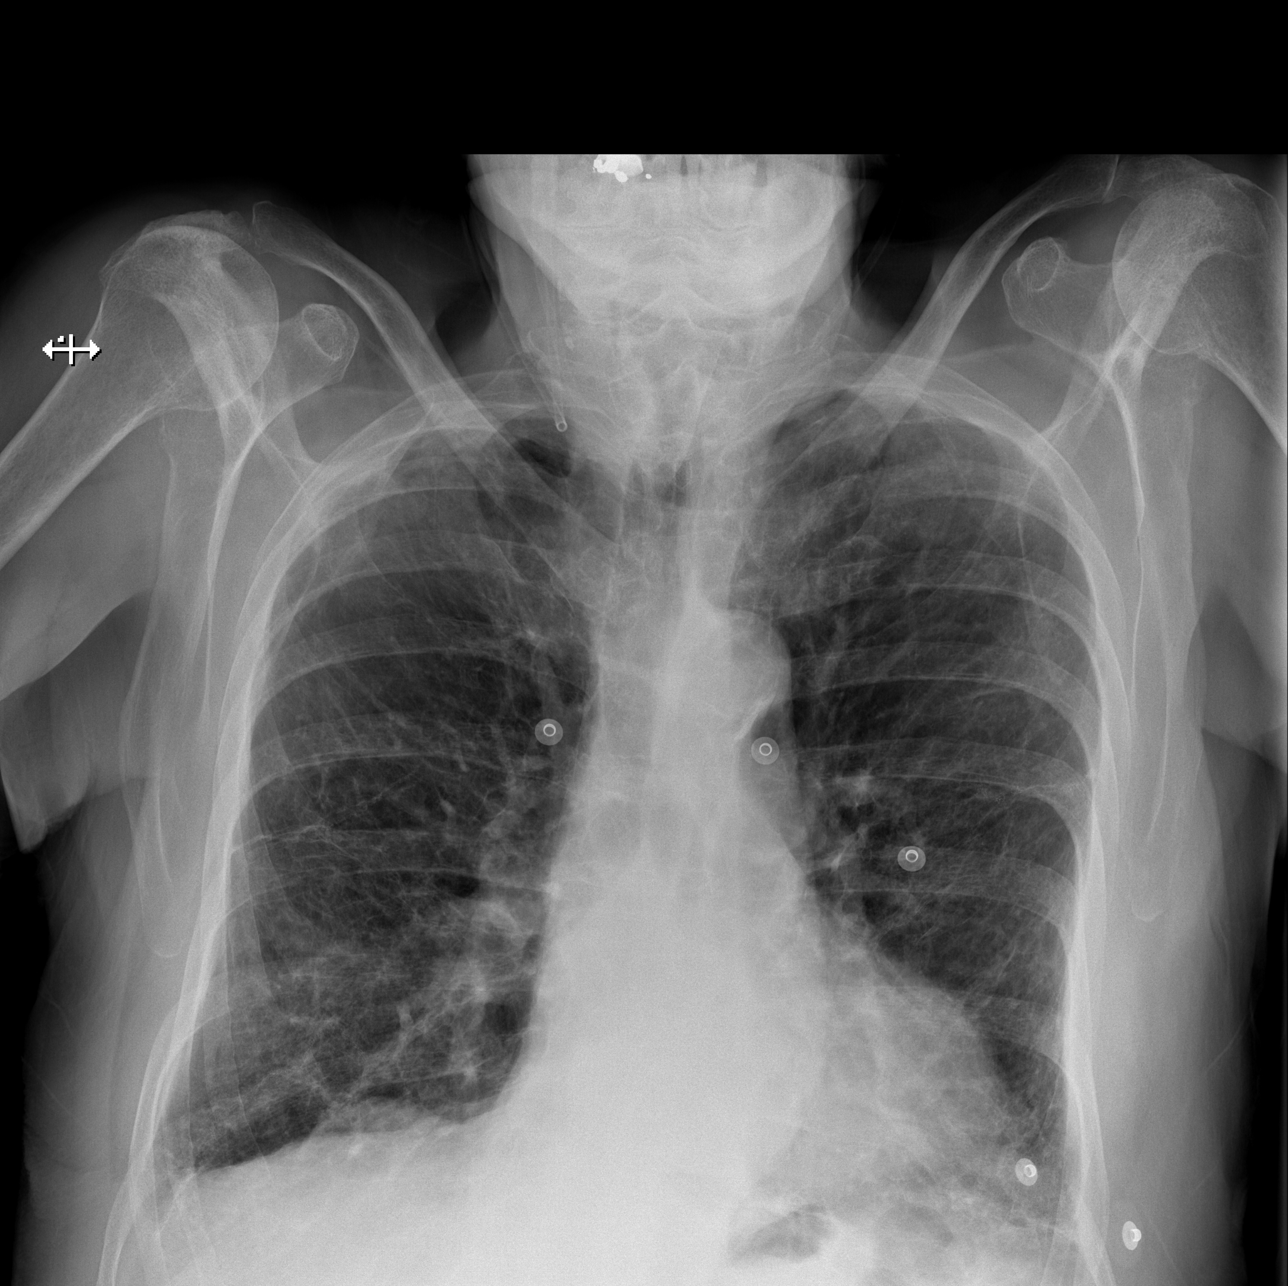

[w chest lat (1 of 2)]
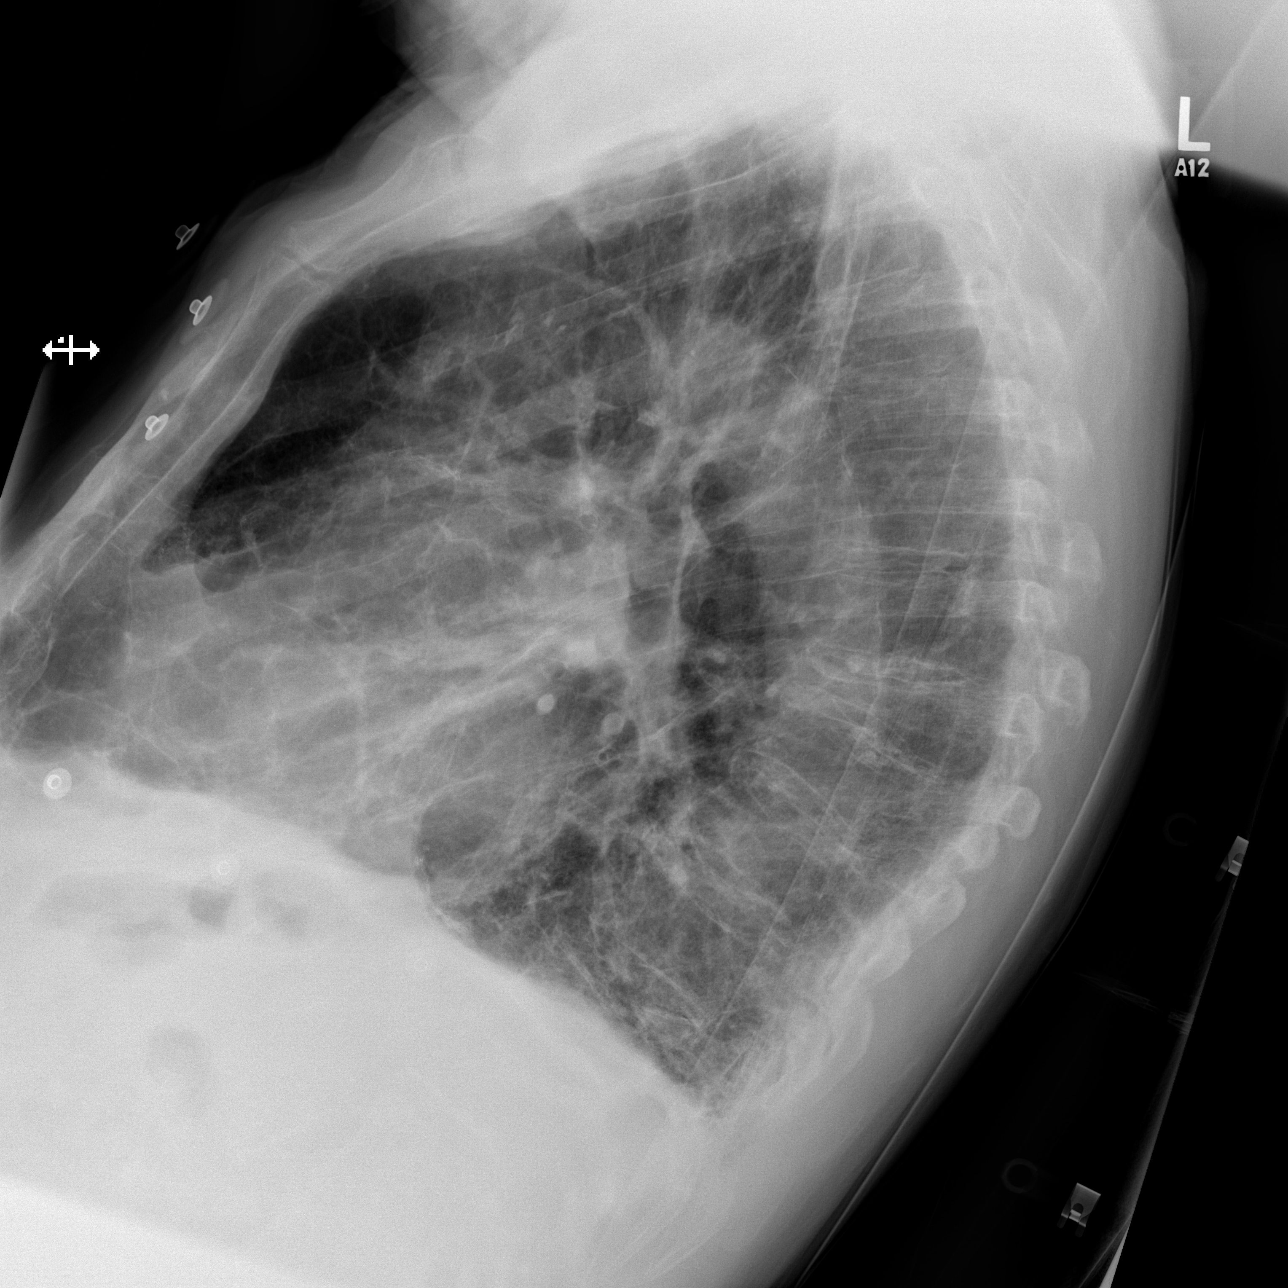

[w chest lat (2 of 2)]
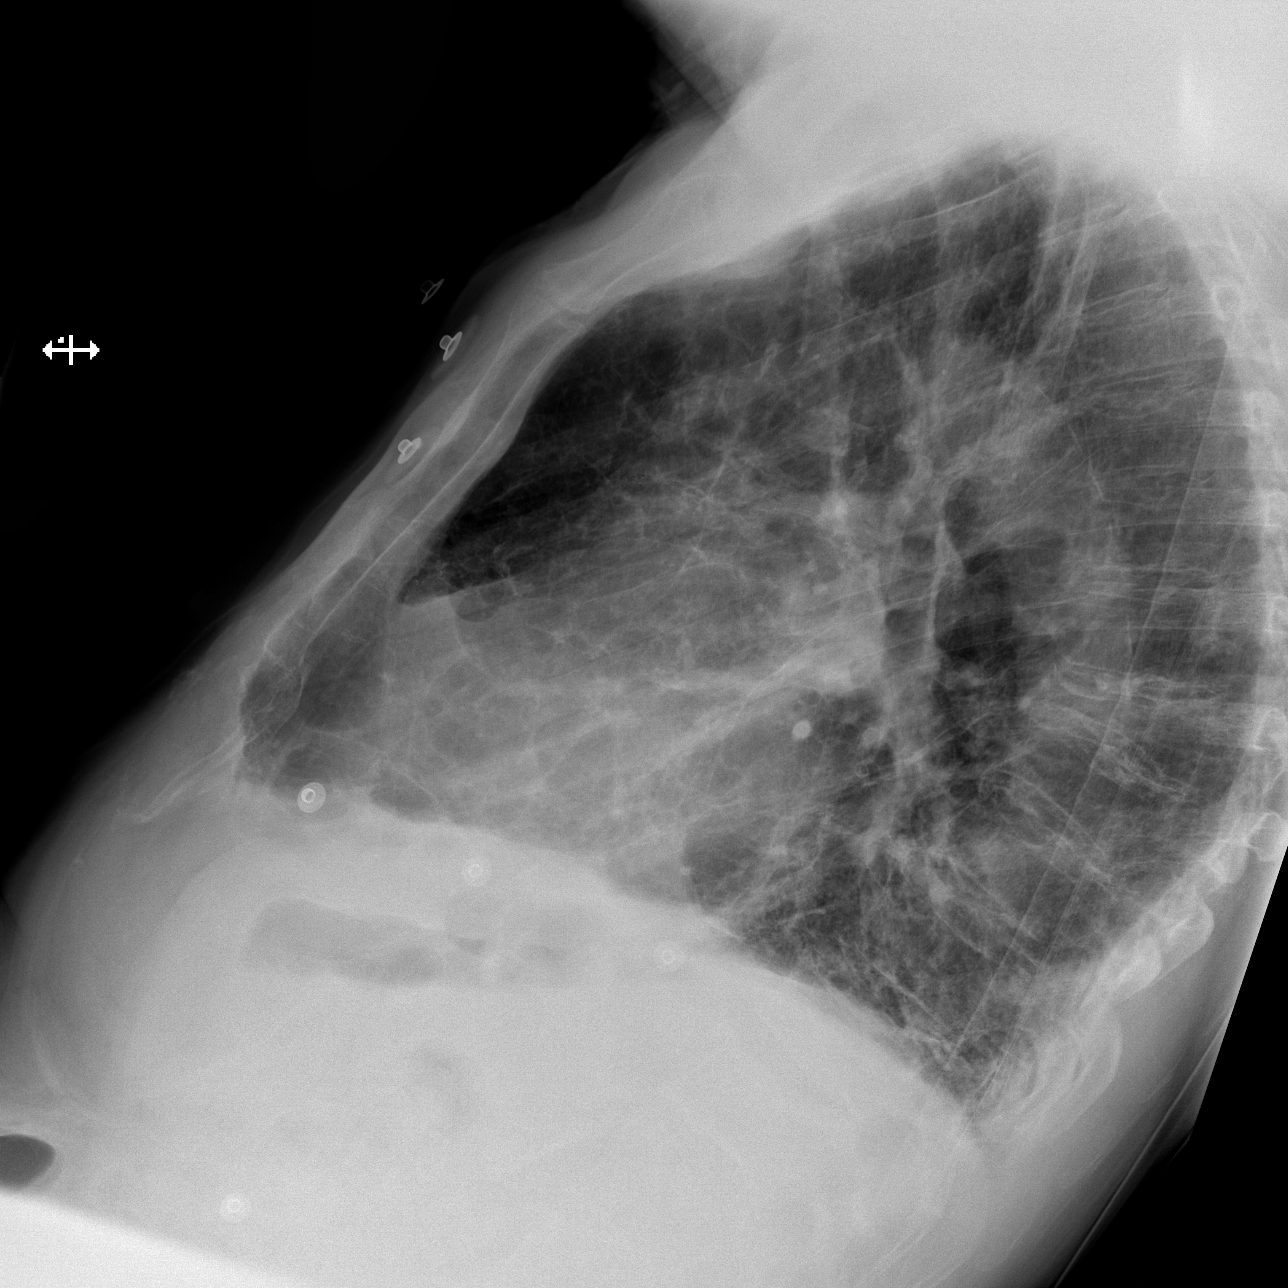

[3 of 3 positions shown; findings below may reference images not displayed]

FINDINGS: Stable hyperinflation in the lungs. Again noted are prominent lung
markings with underlying emphysema. Again noted is a compression
fracture at T8. No focal airspace disease. Heart and mediastinum are
within normal limits. No large pleural effusions. Trachea is
midline.
IMPRESSION: Chronic lung changes without acute findings.

## 2017-05-18 IMAGING — DX DG CHEST 2V
2 series · 2 of 2 positions shown · non-contrast
Comparison: 01/04/2016

CLINICAL DATA: COPD.  Former smoker.

EXAM:
CHEST  2 VIEW

[chest pa]
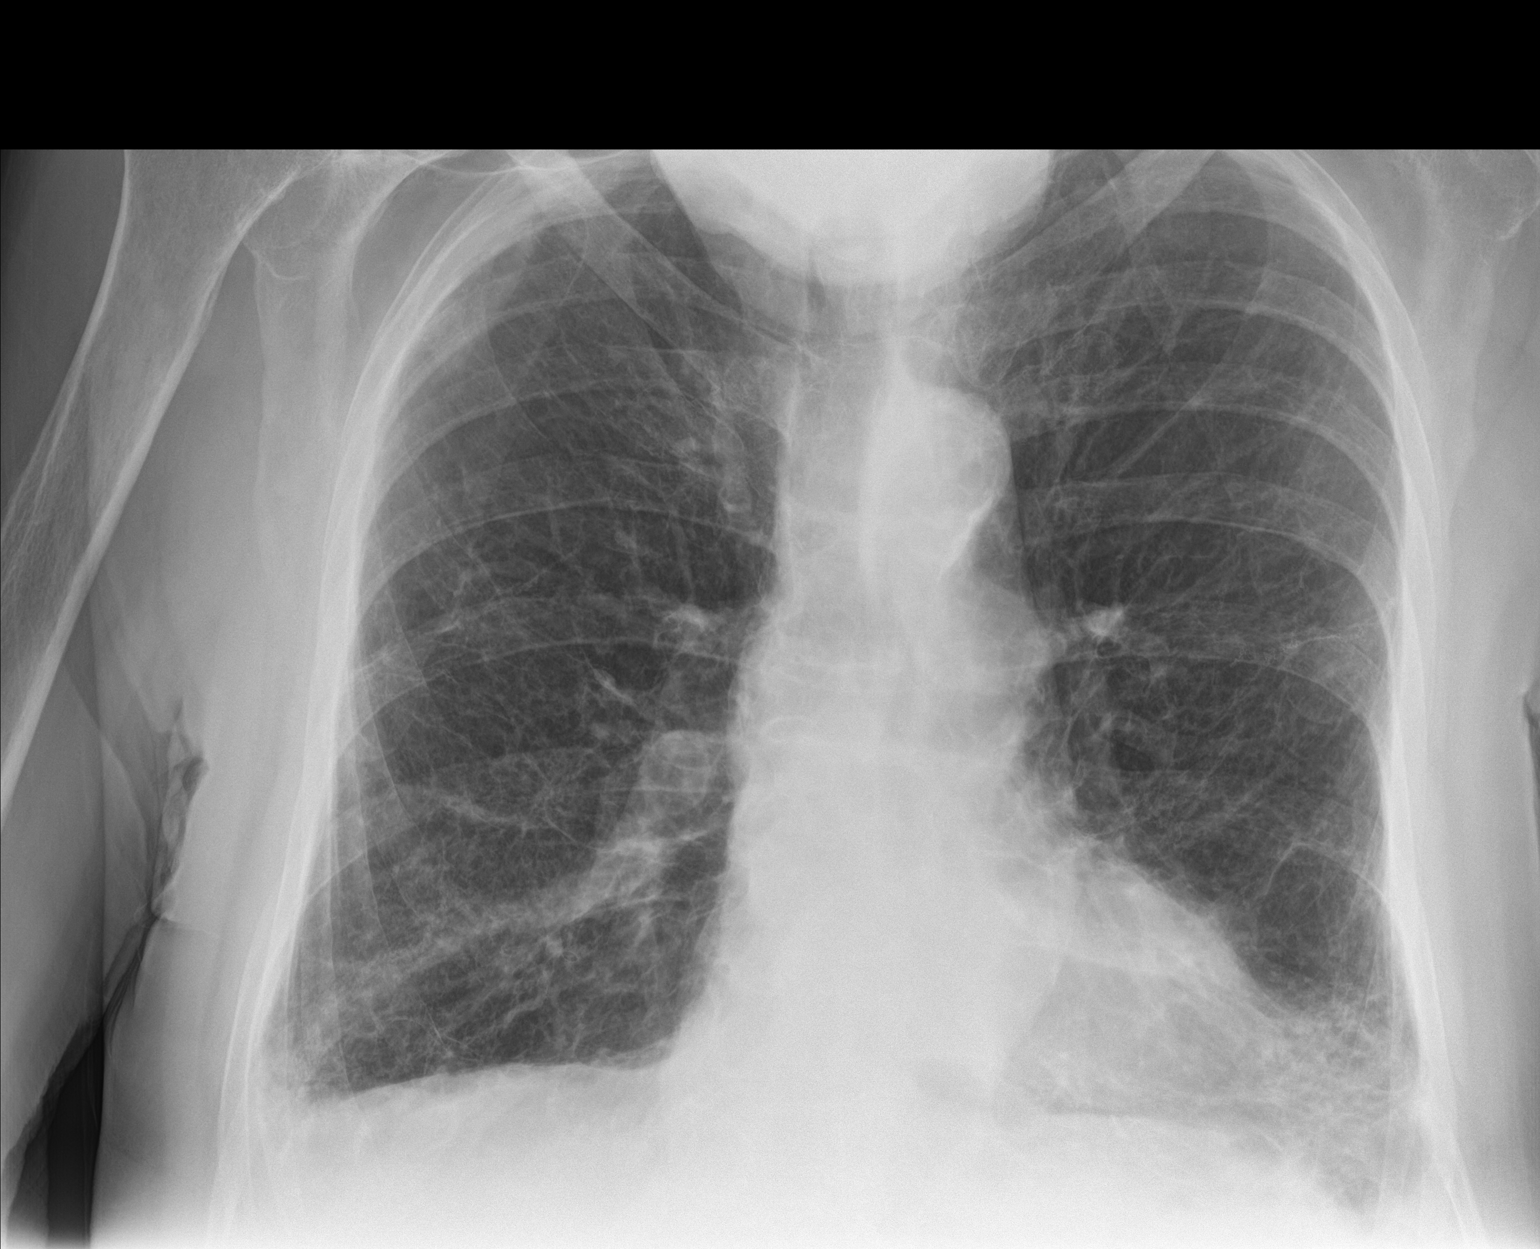

[chest lat]
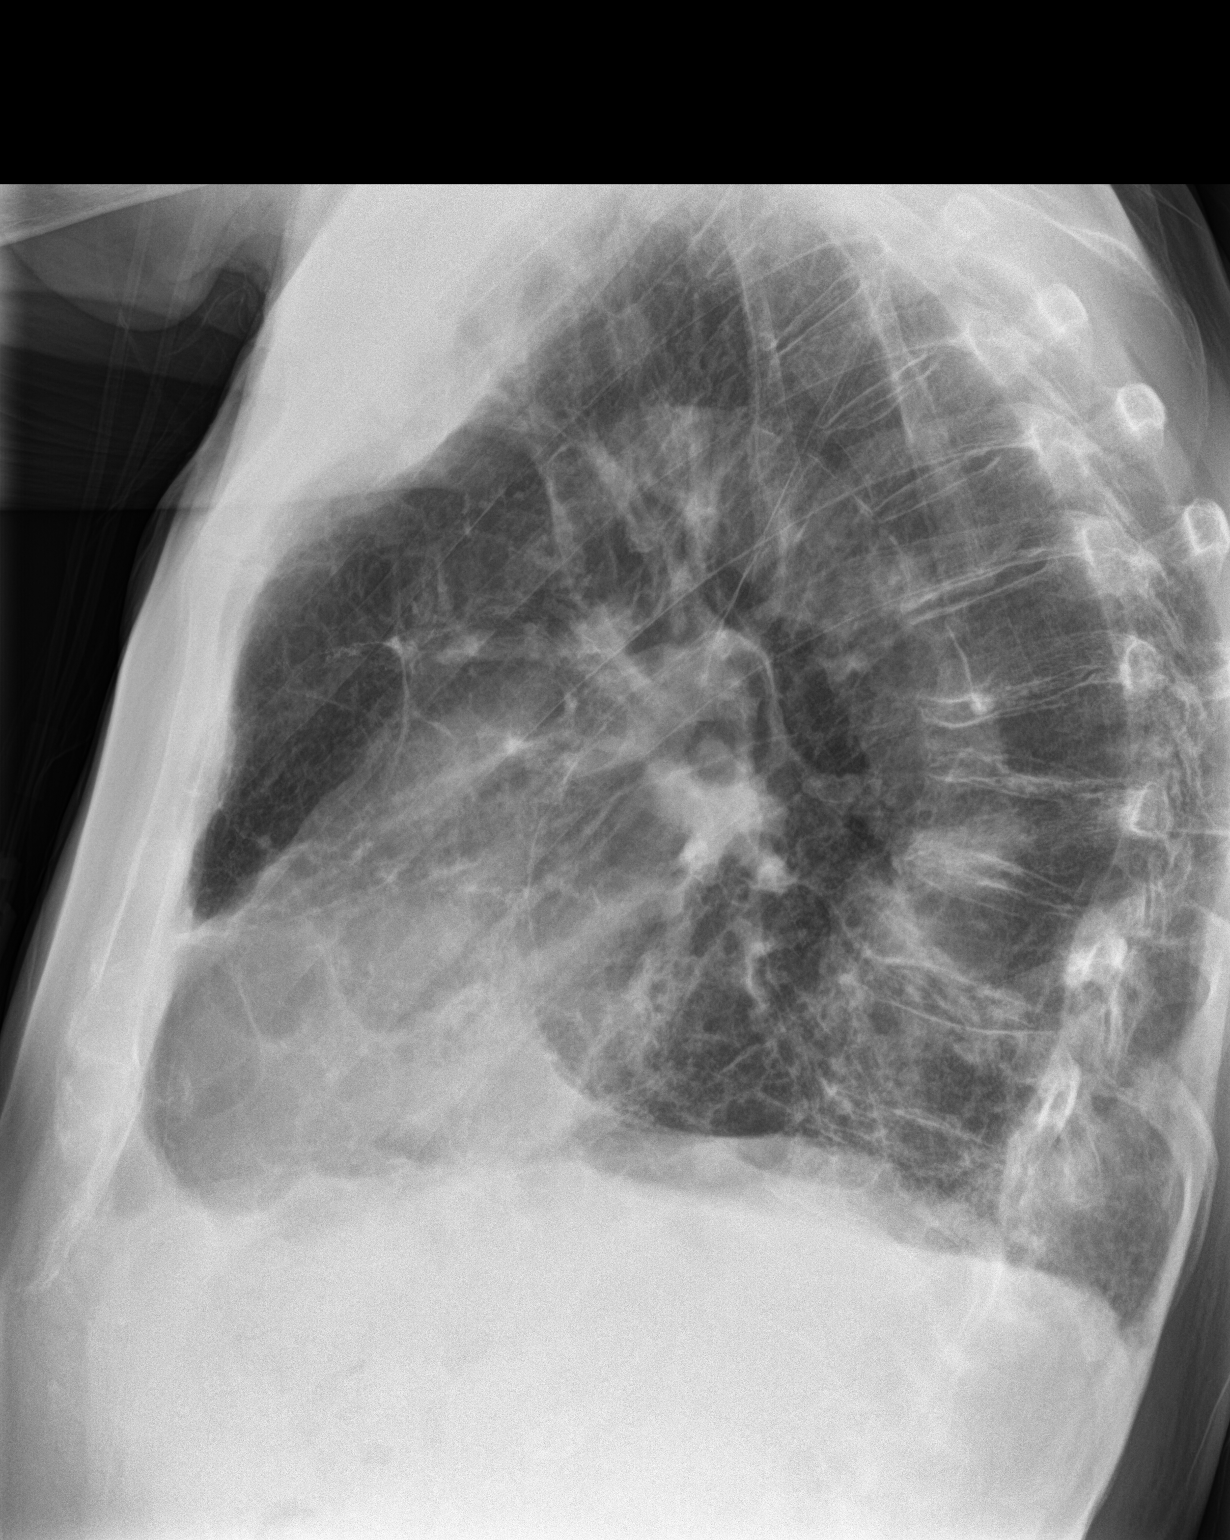

[2 of 2 positions shown; findings below may reference images not displayed]

FINDINGS: Normal heart size. Aortic atherosclerosis noted. Advanced changes of
COPD/ emphysema noted. No pleural effusion or edema. No airspace
opacities identified. There is compression deformity within the mid
thoracic spine identified, unchanged from previous exam.
IMPRESSION: 1. Advanced changes of COPD/emphysema.
2. Aortic atherosclerosis
